# Patient Record
Sex: Female | Born: 1955
Health system: Southern US, Community
[De-identification: ages and names within clinical notes are randomized; demographics above are authoritative.]

## PROBLEM LIST (undated history)

## (undated) DIAGNOSIS — J45998 Other asthma: Secondary | ICD-10-CM

## (undated) DIAGNOSIS — Z862 Personal history of diseases of the blood and blood-forming organs and certain disorders involving the immune mechanism: Secondary | ICD-10-CM

## (undated) DIAGNOSIS — E119 Type 2 diabetes mellitus without complications: Secondary | ICD-10-CM

## (undated) DIAGNOSIS — K219 Gastro-esophageal reflux disease without esophagitis: Secondary | ICD-10-CM

## (undated) DIAGNOSIS — M5030 Other cervical disc degeneration, unspecified cervical region: Secondary | ICD-10-CM

## (undated) DIAGNOSIS — J309 Allergic rhinitis, unspecified: Secondary | ICD-10-CM

## (undated) DIAGNOSIS — Z923 Personal history of irradiation: Secondary | ICD-10-CM

## (undated) DIAGNOSIS — E669 Obesity, unspecified: Secondary | ICD-10-CM

## (undated) DIAGNOSIS — F32A Depression, unspecified: Secondary | ICD-10-CM

## (undated) DIAGNOSIS — Z9221 Personal history of antineoplastic chemotherapy: Secondary | ICD-10-CM

## (undated) DIAGNOSIS — F329 Major depressive disorder, single episode, unspecified: Secondary | ICD-10-CM

## (undated) DIAGNOSIS — M1990 Unspecified osteoarthritis, unspecified site: Secondary | ICD-10-CM

## (undated) DIAGNOSIS — J069 Acute upper respiratory infection, unspecified: Secondary | ICD-10-CM

## (undated) DIAGNOSIS — Z794 Long term (current) use of insulin: Secondary | ICD-10-CM

## (undated) DIAGNOSIS — C50412 Malignant neoplasm of upper-outer quadrant of left female breast: Principal | ICD-10-CM

## (undated) DIAGNOSIS — I10 Essential (primary) hypertension: Secondary | ICD-10-CM

## (undated) DIAGNOSIS — IMO0001 Reserved for inherently not codable concepts without codable children: Secondary | ICD-10-CM

## (undated) HISTORY — PX: OTHER SURGICAL HISTORY: SHX169

## (undated) HISTORY — PX: UPPER GI ENDOSCOPY: SHX6162

## (undated) HISTORY — DX: Gastro-esophageal reflux disease without esophagitis: K21.9

## (undated) HISTORY — DX: Obesity, unspecified: E66.9

## (undated) HISTORY — DX: Essential (primary) hypertension: I10

## (undated) HISTORY — PX: COLONOSCOPY: SHX174

## (undated) HISTORY — DX: Depression, unspecified: F32.A

## (undated) HISTORY — PX: TONSILLECTOMY: SUR1361

## (undated) HISTORY — DX: Malignant neoplasm of upper-outer quadrant of left female breast: C50.412

## (undated) HISTORY — PX: KNEE ARTHROSCOPY: SHX127

## (undated) HISTORY — DX: Unspecified osteoarthritis, unspecified site: M19.90

## (undated) HISTORY — DX: Allergic rhinitis, unspecified: J30.9

## (undated) HISTORY — PX: ABDOMINAL HYSTERECTOMY: SHX81

## (undated) HISTORY — PX: CHOLECYSTECTOMY: SHX55

## (undated) HISTORY — PX: APPENDECTOMY: SHX54

## (undated) HISTORY — DX: Acute upper respiratory infection, unspecified: J06.9

---
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
## 1978-08-09 HISTORY — PX: CHOLECYSTECTOMY: SHX55

## 1978-08-09 HISTORY — PX: TONSILLECTOMY: SUR1361

## 1978-08-09 HISTORY — PX: APPENDECTOMY: SHX54

## 1978-11-08 HISTORY — PX: OTHER SURGICAL HISTORY: SHX169

## 1993-08-09 HISTORY — PX: ABDOMINAL HYSTERECTOMY: SHX81

## 1997-12-23 ENCOUNTER — Ambulatory Visit (HOSPITAL_COMMUNITY): Admission: RE | Admit: 1997-12-23 | Discharge: 1997-12-23 | Payer: Self-pay | Admitting: Obstetrics & Gynecology

## 1998-01-07 ENCOUNTER — Emergency Department (HOSPITAL_COMMUNITY): Admission: EM | Admit: 1998-01-07 | Discharge: 1998-01-07 | Payer: Self-pay | Admitting: Internal Medicine

## 1998-04-15 ENCOUNTER — Encounter: Admission: RE | Admit: 1998-04-15 | Discharge: 1998-07-14 | Payer: Self-pay | Admitting: Emergency Medicine

## 1998-05-01 ENCOUNTER — Ambulatory Visit (HOSPITAL_COMMUNITY): Admission: RE | Admit: 1998-05-01 | Discharge: 1998-05-01 | Payer: Self-pay | Admitting: Gastroenterology

## 1999-01-19 ENCOUNTER — Ambulatory Visit (HOSPITAL_COMMUNITY): Admission: RE | Admit: 1999-01-19 | Discharge: 1999-01-19 | Payer: Self-pay | Admitting: *Deleted

## 1999-07-13 ENCOUNTER — Encounter: Payer: Self-pay | Admitting: Emergency Medicine

## 1999-07-13 ENCOUNTER — Emergency Department (HOSPITAL_COMMUNITY): Admission: EM | Admit: 1999-07-13 | Discharge: 1999-07-13 | Payer: Self-pay

## 1999-11-24 ENCOUNTER — Other Ambulatory Visit: Admission: RE | Admit: 1999-11-24 | Discharge: 1999-11-24 | Payer: Self-pay

## 2000-01-20 ENCOUNTER — Encounter: Payer: Self-pay | Admitting: Internal Medicine

## 2000-01-20 ENCOUNTER — Ambulatory Visit (HOSPITAL_COMMUNITY): Admission: RE | Admit: 2000-01-20 | Discharge: 2000-01-20 | Payer: Self-pay | Admitting: Internal Medicine

## 2000-04-12 ENCOUNTER — Encounter (INDEPENDENT_AMBULATORY_CARE_PROVIDER_SITE_OTHER): Payer: Self-pay

## 2000-04-12 ENCOUNTER — Ambulatory Visit (HOSPITAL_COMMUNITY): Admission: RE | Admit: 2000-04-12 | Discharge: 2000-04-12 | Payer: Self-pay | Admitting: *Deleted

## 2001-01-05 ENCOUNTER — Other Ambulatory Visit: Admission: RE | Admit: 2001-01-05 | Discharge: 2001-01-05 | Payer: Self-pay | Admitting: Obstetrics and Gynecology

## 2001-01-26 ENCOUNTER — Encounter: Payer: Self-pay | Admitting: Obstetrics and Gynecology

## 2001-01-26 ENCOUNTER — Ambulatory Visit (HOSPITAL_COMMUNITY): Admission: RE | Admit: 2001-01-26 | Discharge: 2001-01-26 | Payer: Self-pay | Admitting: Obstetrics and Gynecology

## 2002-01-05 ENCOUNTER — Other Ambulatory Visit: Admission: RE | Admit: 2002-01-05 | Discharge: 2002-01-05 | Payer: Self-pay | Admitting: Obstetrics and Gynecology

## 2002-03-01 ENCOUNTER — Ambulatory Visit (HOSPITAL_COMMUNITY): Admission: RE | Admit: 2002-03-01 | Discharge: 2002-03-01 | Payer: Self-pay | Admitting: Obstetrics and Gynecology

## 2002-03-01 ENCOUNTER — Encounter: Payer: Self-pay | Admitting: Obstetrics and Gynecology

## 2002-04-20 ENCOUNTER — Emergency Department (HOSPITAL_COMMUNITY): Admission: EM | Admit: 2002-04-20 | Discharge: 2002-04-20 | Payer: Self-pay | Admitting: Emergency Medicine

## 2002-12-10 ENCOUNTER — Ambulatory Visit (HOSPITAL_COMMUNITY): Admission: RE | Admit: 2002-12-10 | Discharge: 2002-12-10 | Payer: Self-pay | Admitting: *Deleted

## 2003-01-07 ENCOUNTER — Encounter: Payer: Self-pay | Admitting: *Deleted

## 2003-01-07 ENCOUNTER — Ambulatory Visit (HOSPITAL_COMMUNITY): Admission: RE | Admit: 2003-01-07 | Discharge: 2003-01-07 | Payer: Self-pay | Admitting: *Deleted

## 2003-03-07 ENCOUNTER — Encounter: Payer: Self-pay | Admitting: Obstetrics and Gynecology

## 2003-03-07 ENCOUNTER — Ambulatory Visit (HOSPITAL_COMMUNITY): Admission: RE | Admit: 2003-03-07 | Discharge: 2003-03-07 | Payer: Self-pay | Admitting: Obstetrics and Gynecology

## 2004-07-06 ENCOUNTER — Ambulatory Visit: Payer: Self-pay | Admitting: Oncology

## 2004-08-06 ENCOUNTER — Ambulatory Visit (HOSPITAL_COMMUNITY): Admission: RE | Admit: 2004-08-06 | Discharge: 2004-08-06 | Payer: Self-pay | Admitting: Orthopedic Surgery

## 2004-08-06 ENCOUNTER — Ambulatory Visit (HOSPITAL_BASED_OUTPATIENT_CLINIC_OR_DEPARTMENT_OTHER): Admission: RE | Admit: 2004-08-06 | Discharge: 2004-08-06 | Payer: Self-pay | Admitting: Orthopedic Surgery

## 2004-08-06 HISTORY — PX: CARPAL TUNNEL RELEASE: SHX101

## 2004-08-06 HISTORY — PX: TRIGGER FINGER RELEASE: SHX641

## 2004-08-24 ENCOUNTER — Encounter (INDEPENDENT_AMBULATORY_CARE_PROVIDER_SITE_OTHER): Payer: Self-pay | Admitting: Specialist

## 2004-08-24 ENCOUNTER — Ambulatory Visit (HOSPITAL_COMMUNITY): Admission: RE | Admit: 2004-08-24 | Discharge: 2004-08-24 | Payer: Self-pay | Admitting: *Deleted

## 2004-08-31 ENCOUNTER — Ambulatory Visit (HOSPITAL_COMMUNITY): Admission: RE | Admit: 2004-08-31 | Discharge: 2004-08-31 | Payer: Self-pay | Admitting: Obstetrics and Gynecology

## 2004-09-29 ENCOUNTER — Ambulatory Visit: Payer: Self-pay | Admitting: Oncology

## 2005-06-01 ENCOUNTER — Encounter: Admission: RE | Admit: 2005-06-01 | Discharge: 2005-06-01 | Payer: Self-pay | Admitting: Endocrinology

## 2005-06-15 ENCOUNTER — Ambulatory Visit (HOSPITAL_COMMUNITY): Admission: RE | Admit: 2005-06-15 | Discharge: 2005-06-15 | Payer: Self-pay | Admitting: *Deleted

## 2005-09-20 ENCOUNTER — Ambulatory Visit (HOSPITAL_COMMUNITY): Admission: RE | Admit: 2005-09-20 | Discharge: 2005-09-20 | Payer: Self-pay | Admitting: Obstetrics and Gynecology

## 2005-09-23 ENCOUNTER — Ambulatory Visit (HOSPITAL_COMMUNITY): Admission: RE | Admit: 2005-09-23 | Discharge: 2005-09-23 | Payer: Self-pay | Admitting: *Deleted

## 2006-08-16 ENCOUNTER — Encounter: Admission: RE | Admit: 2006-08-16 | Discharge: 2006-08-16 | Payer: Self-pay | Admitting: Internal Medicine

## 2006-10-17 ENCOUNTER — Ambulatory Visit (HOSPITAL_COMMUNITY): Admission: RE | Admit: 2006-10-17 | Discharge: 2006-10-17 | Payer: Self-pay | Admitting: Obstetrics and Gynecology

## 2007-01-14 ENCOUNTER — Observation Stay (HOSPITAL_COMMUNITY): Admission: EM | Admit: 2007-01-14 | Discharge: 2007-01-15 | Payer: Self-pay | Admitting: Emergency Medicine

## 2007-10-19 ENCOUNTER — Ambulatory Visit (HOSPITAL_COMMUNITY): Admission: RE | Admit: 2007-10-19 | Discharge: 2007-10-19 | Payer: Self-pay | Admitting: Obstetrics and Gynecology

## 2008-02-17 ENCOUNTER — Emergency Department (HOSPITAL_COMMUNITY): Admission: EM | Admit: 2008-02-17 | Discharge: 2008-02-17 | Payer: Self-pay | Admitting: Family Medicine

## 2008-02-20 ENCOUNTER — Encounter: Admission: RE | Admit: 2008-02-20 | Discharge: 2008-03-13 | Payer: Self-pay | Admitting: Neurosurgery

## 2008-06-04 ENCOUNTER — Encounter: Admission: RE | Admit: 2008-06-04 | Discharge: 2008-06-04 | Payer: Self-pay | Admitting: Family Medicine

## 2008-10-24 ENCOUNTER — Ambulatory Visit (HOSPITAL_COMMUNITY): Admission: RE | Admit: 2008-10-24 | Discharge: 2008-10-24 | Payer: Self-pay | Admitting: Obstetrics and Gynecology

## 2008-11-13 ENCOUNTER — Encounter: Admission: RE | Admit: 2008-11-13 | Discharge: 2008-11-13 | Payer: Self-pay | Admitting: Family Medicine

## 2008-12-14 ENCOUNTER — Encounter: Admission: RE | Admit: 2008-12-14 | Discharge: 2008-12-14 | Payer: Self-pay | Admitting: Family Medicine

## 2009-01-23 ENCOUNTER — Encounter: Admission: RE | Admit: 2009-01-23 | Discharge: 2009-02-26 | Payer: Self-pay | Admitting: Family Medicine

## 2009-03-09 ENCOUNTER — Emergency Department (HOSPITAL_COMMUNITY): Admission: EM | Admit: 2009-03-09 | Discharge: 2009-03-09 | Payer: Self-pay | Admitting: Emergency Medicine

## 2009-03-19 ENCOUNTER — Ambulatory Visit: Payer: Self-pay | Admitting: Vascular Surgery

## 2009-03-19 ENCOUNTER — Ambulatory Visit (HOSPITAL_COMMUNITY): Admission: RE | Admit: 2009-03-19 | Discharge: 2009-03-19 | Payer: Self-pay | Admitting: Sports Medicine

## 2009-03-19 ENCOUNTER — Encounter (INDEPENDENT_AMBULATORY_CARE_PROVIDER_SITE_OTHER): Payer: Self-pay | Admitting: Sports Medicine

## 2009-05-05 ENCOUNTER — Encounter: Admission: RE | Admit: 2009-05-05 | Discharge: 2009-05-05 | Payer: Self-pay | Admitting: Sports Medicine

## 2009-07-28 ENCOUNTER — Encounter: Admission: RE | Admit: 2009-07-28 | Discharge: 2009-07-28 | Payer: Self-pay | Admitting: Family Medicine

## 2009-10-28 ENCOUNTER — Ambulatory Visit (HOSPITAL_COMMUNITY): Admission: RE | Admit: 2009-10-28 | Discharge: 2009-10-28 | Payer: Self-pay | Admitting: Family Medicine

## 2010-03-03 ENCOUNTER — Emergency Department (HOSPITAL_COMMUNITY): Admission: EM | Admit: 2010-03-03 | Discharge: 2010-03-03 | Payer: Self-pay | Admitting: Emergency Medicine

## 2010-06-26 ENCOUNTER — Emergency Department (HOSPITAL_COMMUNITY)
Admission: EM | Admit: 2010-06-26 | Discharge: 2010-06-26 | Payer: Self-pay | Source: Home / Self Care | Admitting: Emergency Medicine

## 2010-08-15 ENCOUNTER — Emergency Department (HOSPITAL_COMMUNITY)
Admission: EM | Admit: 2010-08-15 | Discharge: 2010-08-15 | Payer: Self-pay | Source: Home / Self Care | Admitting: Emergency Medicine

## 2010-08-30 ENCOUNTER — Encounter: Payer: Self-pay | Admitting: Family Medicine

## 2010-12-22 NOTE — H&P (Signed)
NAMEANIKO, Dominique Dillon               ACCOUNT NO.:  [id number]   MEDICAL RECORD NO.:  [id number]          PATIENT TYPE:  INP   LOCATION:  1827                         FACILITY:  [facility]   PHYSICIAN:  Wilson Singer, M.D.DATE OF BIRTH:  [birth year]   DATE OF ADMISSION:  01/14/2007  DATE OF DISCHARGE:                              HISTORY & PHYSICAL   HISTORY:  This is a very pleasant 55 year old African American lady who  has a background history of type 2 insulin-dependent diabetes mellitus,  hypertension, hyperlipidemia and now presents with a several hour  history of dull, pressing chest pain/pressure radiating up the right  neck and going down the right arm.  She woke up at about 2 a.m. this  morning, which was approximately 7 hours ago with a blood sugar of 45.  Since this time, she had been having this chest pressure.  She had some  sweet drink to drink and her blood sugar came up to 99, but she still  continues to have the chest pressure and, therefore, comes to the  emergency room for further evaluation.  She says she apparently had a  cardiac catheterization about a year ago with, what sounds like  Human resources officer.  She has no history of  ischemic heart disease in the past, having not had a myocardial  infarction.  There is no history of cerebral vascular disease.   PAST MEDICAL HISTORY:  1. Type 2 insulin-dependent diabetes mellitus, initially diagnosed in      1995.  2. Hypertension.  3. Hyperlipidemia.  4. Gastroesophageal reflux disease.   PAST SURGICAL HISTORY:  1. Right carpal tunnel surgery in December of 2005.  2. Cesarean section.  3. Hysterectomy.  4. Cholecystectomy.  5. Tonsillectomy.  6. Bilateral knee arthroscopies.  7. She also had a colonoscopy in January of 2006 for rectal bleeding      and internal hemorrhoids were the only finding by Dr. Virginia Rochester.   SOCIAL HISTORY:  She has been divorced 35 years.  One daughter lives  with  her.  She does not smoke and does not drink alcohol.  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   FAMILY HISTORY:  Noncontributory.   REVIEW OF SYSTEMS:  Apart from the symptom mentioned above, there are no  other symptoms in all systems reviewed.   MEDICATIONS:  1. Glucophage 2 grams per day.  2. Lantus insulin 83 units daily.  3. Apidra 17 units at lunch time, 40 units at evening time.  4. TriCor 145 mg daily.  5. Zetia 10 mg daily.  6. Diovan dose unclear, but she thinks it is 80 mg daily.  7. Aciphex 20 mg daily.  8. Mobic dose unclear.  9. Effexor XR 150 mg daily.   ALLERGIES:  BIAXIN.   PHYSICAL EXAMINATION:  VITAL SIGNS:  Temperature 97.1.  Blood pressure  129/84.  Pulse 80.  Saturation 99%.  CARDIOVASCULAR EXAMINATION:  Heart sounds are present and normal without  murmurs.  There is no pericardial rub.  Jugular venous pressure is not  raised.  There is no gallop  heard.  RESPIRATORY:  Lungs fields are clear.  There is no pleural rub.  There  is no wheezing, crackles or bronchial breathing.  ABDOMEN:  Soft, nontender with no hepatosplenomegaly.  There is a  cholecystectomy scar.  NEUROLOGICAL:  She is alert and oriented with no focal neurological  signs.   INVESTIGATIONS:  Sodium 138, potassium 4.0, chloride 107, BUN 16,  creatinine 0.9.  Troponin less than 0.05.  Hemoglobin 11.2 with an MCV  reduced at 72.2, white blood cell count 7.8, platelets 431.  Electrocardiogram, done in the emergency room, shows normal sinus rhythm  with a right bundle branch block pattern, but no acute ST/T wave  changes.   IMPRESSION:  1. Chest pain, rule out cardiac ischemia.  2. Microcytic anemia.  3. Type 2 insulin-dependent diabetes mellitus.  4. Hypertension.  5. Hyperlipidemia.  6. Obesity.   PLAN:  1. Admit to telemetry and cycle cardiac enzymes.  2. Cardiology consultation.  I think this lady needs cardiac      catheterization.  3. Control diabetes and hypertension.  4.  Further recommendations will depend on patient's hospital progress.      Wilson Singer, M.D.  Electronically Signed     NCG/MEDQ  D:  01/14/2007  T:  01/14/2007  Job:  [id number]   cc:   Soyla Murphy. Renne Crigler, M.D.

## 2010-12-25 NOTE — Discharge Summary (Signed)
Dominique Dillon, Dominique Dillon               ACCOUNT NO.:  [id number]   MEDICAL RECORD NO.:  [id number]          PATIENT TYPE:  OBV   LOCATION:  5504                         FACILITY:  [facility]   PHYSICIAN:  Wilson Singer, M.D.DATE OF BIRTH:  February 27, 1956   DATE OF ADMISSION:  01/14/2007  DATE OF DISCHARGE:  01/15/2007                               DISCHARGE SUMMARY   FINAL DISCHARGE DIAGNOSES:  1. Chest pain, need for outpatient stress test.  2. Hypertension.  3. T2 insulin-dependent diabetes mellitus.  4. Hyperlipidemia.   DISCHARGE MEDICATIONS:  1. Lantus 75 units daily.  2. Lopressor 12.5 mg b.i.d.  3. Apidra 17 units at lunch and 40 units at dinner.  4. Metformin 1000 mg b.i.d.  5. Diovan 80 mg daily.  6. Tricor 145 mg daily.  7. Zetia 10 mg daily.  8. Effexor 150 mg daily.  9. Singulair 10 mg daily.   CONDITION ON DISCHARGE:  Stable.   HISTORY:  This 54 year old lady was admitted with chest pain. Please see  initial history and physical examination done by myself.   HOSPITAL PROGRESS:  The patient was admitted to telemetry and serial  cardiac enzymes were done.  These were negative. She was seen by Dr.  Allyson Sabal, cardiology who felt that this may be cardiac chest pain.  Since  there was no acute electrocardiographic changes, he felt it appropriate  to arrange a Cardiolite stress test as an outpatient.  On the day of  discharge he was chest pain.   On examination temperature 98, blood pressure 120/82, pulse 75,  saturation 97% on room air.  CT chest angiogram was negative for  pulmonary embolism.   Further disposition, she will be discharged home today and will follow  up with Dr. Allyson Sabal for the stress test.      Wilson Singer, M.D.  Electronically Signed    NCG/MEDQ  D:  03/10/2007  T:  03/11/2007  Job:  [id number]

## 2010-12-25 NOTE — Op Note (Signed)
NAMESHAVONNE, AMBROISE NO.:  [id number]   MEDICAL RECORD NO.:  [id number]          PATIENT TYPE:  AMB   LOCATION:  ENDO                         FACILITY:  [facility]   PHYSICIAN:  Georgiana Spinner, M.D.    DATE OF BIRTH:  1956-06-04   DATE OF PROCEDURE:  08/24/2004  DATE OF DISCHARGE:                                 OPERATIVE REPORT   PROCEDURE:  Colonoscopy.   INDICATIONS FOR PROCEDURE:  Rectal bleeding.   ANESTHESIA:  Demerol 25, Versed 4 mg.   DESCRIPTION OF PROCEDURE:  With the patient mildly sedated in the left  lateral decubitus position, the Olympus videoscopic colonoscope was inserted  in the rectum and passed under direct vision to the cecum, identified by the  ileocecal valve and appendiceal orifice, both of which were photographed.  From this point, the colonoscope was slowly withdrawn, taking  circumferential views of the colonic mucosa, stopping only in the rectum  which appeared normal on direct and showed hemorrhoids on retroflex view.  The endoscope was straightened and withdrawn.  The patient's vital signs and  pulse oximetry remained stable.  The patient tolerated the procedure well  without apparent complications.   FINDINGS:  Internal hemorrhoids, otherwise, an unremarkable colonoscopic  examination to the cecum.   PLAN:  See endoscopy note for further details.      GMO/MEDQ  D:  08/24/2004  T:  08/24/2004  Job:  [id number]

## 2010-12-25 NOTE — Op Note (Signed)
NAMENARELLE, SCHOENING NO.:  [id number]   MEDICAL RECORD NO.:  [id number]          PATIENT TYPE:  AMB   LOCATION:  ENDO                         FACILITY:  [facility]   PHYSICIAN:  Georgiana Spinner, M.D.    DATE OF BIRTH:  02/11/1956   DATE OF PROCEDURE:  DATE OF DISCHARGE:                                 OPERATIVE REPORT   PROCEDURE:  Upper endoscopy.   INDICATIONS:  Gastroesophageal reflux disease and rectal bleeding.   ANESTHESIA:  Demerol 50, Versed 6 mg.   PROCEDURE:  With the patient mildly sedated in the left lateral decubitus  position, the Olympus videoscopic endoscope was inserted in the mouth,  passed under direct vision through the esophagus which appeared normal.  There was no evidence of Barrett's.  We entered into the stomach, fundus  body, antrum, duodenal bulb and second portion of the duodenum were  visualized.  From this point, the endoscope was slowly withdrawn taking  several views of the duodenal mucosa.  The endoscope had been pulled back  into the stomach, placed in retroflexion and we viewed the stomach from  below.  The endoscope was then straightened and withdrawn, taken circumflex  views of the remaining gastric and esophageal mucosa stopping in the fundus  of the stomach where multiple polyps were seen, photographed and multiple  biopsies were taken.  The endoscope was then withdrawn.  The patient's vital  signs remained stable.  The endoscope was then withdrawn.  The patient's  vital signs remained stable.  The patient tolerated the procedure well with  no apparent complications.   FINDINGS:  Multiple gastric fundic polyps biopsied, await biopsy report.  The patient will call me with results and follow up with as an outpatient.  Proceed to colonoscopy.      GMO/MEDQ  D:  08/24/2004  T:  08/24/2004  Job:  [id number]

## 2010-12-25 NOTE — Procedures (Signed)
[facility]  Patient:    Dominique Dillon, Dominique Dillon                     MRN: [id number] Proc. Date: 04/12/00 Adm. Date:  [id number] Attending:  Sabino Gasser                           Procedure Report  PROCEDURE:  Colonoscopy.  INDICATION FOR PROCEDURE:  Iron deficiency anemia, GI blood loss.  ANESTHESIA:  Demerol 20 mg, Versed 3 mg was given intravenously in divided dose.  DESCRIPTION OF PROCEDURE:  With the patient mildly sedated in the left lateral decubitus position, the Olympus videoscopic colonoscope was inserted in the rectum and passed under direct vision into the cecum. The cecum was identified by the ileocecal valve and appendiceal orifice both of which were photographed. From this point, the colonoscope was slowly withdrawn taking circumferential views of the entire colonic mucosa, stopping only in the rectum which appeared normal in direct view and showed small hemorrhoids on retroflexed view. The endoscope was straightened and withdrawn through the anal canal which also appeared normal. The patients vital signs and pulse oximeter remained stable. The patient tolerated the procedure well without apparent complications.  FINDINGS:  Internal hemorrhoids, small otherwise unremarkable colonoscopic examination.  PLAN:  Await biopsy report of endoscopy. Will have the patient follow-up with me as planned. DD:  04/12/00 TD:  04/12/00 Job: [id number]

## 2010-12-25 NOTE — Op Note (Signed)
Dominique Dillon, Dominique Dillon NO.:  [id number]   MEDICAL RECORD NO.:  [id number]          PATIENT TYPE:  AMB   LOCATION:  DSC                          FACILITY:  [facility]   PHYSICIAN:  Cindee Salt, M.D.       DATE OF BIRTH:  06/09/1956   DATE OF PROCEDURE:  08/06/2004  DATE OF DISCHARGE:                                 OPERATIVE REPORT   PREOPERATIVE DIAGNOSIS:  Carpal tunnel syndrome, right wrist. Stenosing  tenosynovitis, right middle finger.   POSTOPERATIVE DIAGNOSIS:  Carpal tunnel syndrome, right wrist. Stenosing  tenosynovitis, right middle finger.   OPERATION:  Release A1 pulley, right middle finger. Release right carpal  tunnel.   SURGEON:  Cindee Salt, M.D.   ASSISTANT:  ___________.   ANESTHESIA:  IV regional.   HISTORY:  The patient is a 55 year old female with a history of carpal  tunnel syndrome and triggering of her right middle, right hand. It has not  responded to conservative treatment.   PROCEDURE:  The patient was brought to the operating room where a forearm-  based IV regional anesthetic was carried out without difficulty. She prepped  using Duraprep, supine position, with the right arm free. A longitudinal  incision was made in the palm and carried through subcutaneous tissue.  Bleeders were electrocauterized. Palmar fascia was split. Superficial palmar  arch identified. The flexor tendon to the ring and little finger identified  to the ulnar side of the median nerve. The carpal retinaculum was incised  with sharp dissection at a right angle and retractor placed between skin and  forearm fascia. The fascia was released for approximately a centimeter and a  half proximal to the wrist crease under direct vision. Canal was explored.  An area of the compression of the nerve apparent. A persistent median artery  was present. This was not thrombosed. Tenosynovial tissue was moderately  thickened. No further lesions were identified. The wound was  irrigated. The  skin was closed with interrupted 5-0 nylon sutures. A separate incision was  then made over the A1 pulley of the right middle finger, carried down  through subcutaneous tissue. Neurovascular structures identified and  protected. Retractors placed. An incision was then made on the radial aspect  of the A1 pulley; this pulley released. A separate incision was made in the  central aspect of the A2 pulley. The finger placed through a range of  motion. No further triggering was evident. The wound was irrigated. The skin  closed with interrupted 5-0 nylon sutures. Sterile compressive dressing and  splint was applied. The patient tolerated the procedure well and was taken  to the recovery room for observation in satisfactory condition. She is  discharged home to return to [facility] in one week on  Vicodin.     GK/MEDQ  D:  08/06/2004  T:  08/06/2004  Job:  [id number]

## 2010-12-25 NOTE — Op Note (Signed)
   NAME:  Dominique Dillon, Dominique Dillon                        ACCOUNT NO.:  [id number]   MEDICAL RECORD NO.:  [id number]                   PATIENT TYPE:  AMB   LOCATION:  ENDO                                 FACILITY:  [facility]   PHYSICIAN:  Georgiana Spinner, M.D.                 DATE OF BIRTH:  01-11-1956   DATE OF PROCEDURE:  12/10/2002  DATE OF DISCHARGE:                                 OPERATIVE REPORT   PROCEDURE:  Upper endoscopy.   INDICATIONS:  GERD.   ANESTHESIA:  Demerol 80 mg, Versed 8 mg.   DESCRIPTION OF PROCEDURE:  With the patient mildly sedated in the left  lateral decubitus position, the Olympus videoscopic endoscope was inserted  in the mouth, passed under direct vision through the esophagus, which  appeared normal.  There was no evidence of Barrett's.  We entered into the  stomach.  Fundus, body, antrum, duodenal bulb, second portion of duodenum  all appeared normal.  From this point the endoscope was slowly withdrawn,  taking circumferential views of the duodenal mucosa until the endoscope had  been pulled back into the stomach, placed in retroflexion to view the  stomach from below, and a hernia was seen and the esophagus could be seen  from below, indicating a lax wrap of the GE junction around the endoscope.  Again on retroflexed view there was no evidence of Barrett's esophagus seen.  The endoscope was then straightened and withdrawn, taking circumferential  views of the remaining gastric and esophageal mucosa.  The patient's vital  signs and pulse oximetry remained stable.  The patient tolerated the  procedure well without apparent complications.   FINDINGS:  Changes of hiatal hernia with lax esophageal sphincter.   PLAN:  Have the patient follow up with me as an outpatient and discuss  further plans for management of her reflux symptomatology.                                               Georgiana Spinner, M.D.    GMO/MEDQ  D:  12/10/2002  T:  12/10/2002  Job:   [id number]

## 2010-12-25 NOTE — Op Note (Signed)
[facility]  Patient:    Dominique Dillon, Dominique Dillon                     MRN: [id number] Proc. Date: 04/12/00 Adm. Date:  [id number] Attending:  Sabino Gasser                           Operative Report  PROCEDURE:  Upper endoscopy with biopsy.  INDICATION FOR PROCEDURE:  GI blood loss.  ANESTHESIA:  Demerol 60 mg, Versed 7 mg.  DESCRIPTION OF PROCEDURE:  With the patient mildly sedated in the left lateral decubitus position, the Olympus videoscopic endoscope was inserted in the mouth and passed under direct vision through the esophagus. The distal esophagus was approached and appeared relatively normal. There was a question of a very small segment of Barretts esophagus which may have been normal but we photographed it and biopsied this area. We entered into the stomach. Most of the fundus, body, antrum, duodenal bulb, and second portion of the duodenum were all visualized and appeared normal.  From this point, the endoscope was slowly withdrawn taking circumferential views of the entire duodenal mucosa until the endoscope was then pulled back into the stomach, placed in retroflexion to view the stomach from below and this too appeared normal. The endoscope was straightened and pulled back from distal to proximal stomach taking circumferential views of the entire gastric mucosa stopping in the fundus where on the anterior wall of the stomach, a number of small polyps were seen, photographed and biopsied. We then withdrew taking circumferential views of the remaining gastric and esophageal mucosa which otherwise appeared normal. The patients vital signs and pulse oximeter remained stable. The patient tolerated the procedure well and there were no apparent complications.  FINDINGS:  Polyps of fundus and a questionable Barretts esophagus all biopsied.  PLAN:  Await biopsy report. The patient will call me for results and follow-up with me as an outpatient. Proceed to  colonoscopy. DD:  04/12/00 TD:  04/12/00 Job: [id number]

## 2011-02-03 ENCOUNTER — Other Ambulatory Visit (HOSPITAL_COMMUNITY): Payer: Self-pay | Admitting: Obstetrics and Gynecology

## 2011-02-03 DIAGNOSIS — Z1231 Encounter for screening mammogram for malignant neoplasm of breast: Secondary | ICD-10-CM

## 2011-02-12 ENCOUNTER — Ambulatory Visit (HOSPITAL_COMMUNITY): Payer: BC Managed Care – PPO

## 2011-02-12 ENCOUNTER — Ambulatory Visit (HOSPITAL_COMMUNITY)
Admission: RE | Admit: 2011-02-12 | Discharge: 2011-02-12 | Disposition: A | Discharge status: Home / Self Care | Payer: BC Managed Care – PPO | Source: Ambulatory Visit | Attending: Obstetrics and Gynecology | Admitting: Obstetrics and Gynecology

## 2011-02-12 DIAGNOSIS — Z1231 Encounter for screening mammogram for malignant neoplasm of breast: Secondary | ICD-10-CM | POA: Insufficient documentation

## 2011-05-27 LAB — D-DIMER, QUANTITATIVE: D-Dimer, Quant: 0.62 — ABNORMAL HIGH

## 2011-05-27 LAB — CBC
HCT: 34.2 — ABNORMAL LOW
Hemoglobin: 11.1 — ABNORMAL LOW
Hemoglobin: 11.2 — ABNORMAL LOW
MCHC: 32.3
MCHC: 32.4
MCV: 72.1 — ABNORMAL LOW
Platelets: 416 — ABNORMAL HIGH
Platelets: 431 — ABNORMAL HIGH
RBC: 4.75
RBC: 4.79
RDW: 16.2 — ABNORMAL HIGH
RDW: 16.4 — ABNORMAL HIGH
WBC: 7.7
WBC: 7.8

## 2011-05-27 LAB — TSH: TSH: 0.864

## 2011-05-27 LAB — DIFFERENTIAL
Basophils Absolute: 0
Basophils Relative: 1
Eosinophils Absolute: 0.2
Eosinophils Relative: 3
Lymphocytes Relative: 30
Lymphs Abs: 2.3
Monocytes Absolute: 0.4
Monocytes Relative: 6
Neutro Abs: 4.8
Neutrophils Relative %: 62

## 2011-05-27 LAB — URINALYSIS, ROUTINE W REFLEX MICROSCOPIC
Bilirubin Urine: NEGATIVE
Glucose, UA: NEGATIVE
Hgb urine dipstick: NEGATIVE
Ketones, ur: NEGATIVE
Nitrite: NEGATIVE
Protein, ur: NEGATIVE
Specific Gravity, Urine: 1.017
Urobilinogen, UA: 0.2
pH: 6

## 2011-05-27 LAB — I-STAT 8, (EC8 V) (CONVERTED LAB)
Acid-Base Excess: 3 — ABNORMAL HIGH
BUN: 16
Bicarbonate: 25.8 — ABNORMAL HIGH
Chloride: 107
Glucose, Bld: 106 — ABNORMAL HIGH
HCT: 37
Hemoglobin: 12.6
Operator id: 196461
Potassium: 4
Sodium: 138
TCO2: 27
pCO2, Ven: 32.3 — ABNORMAL LOW
pH, Ven: 7.51 — ABNORMAL HIGH

## 2011-05-27 LAB — PROTIME-INR
INR: 1
Prothrombin Time: 13.6

## 2011-05-27 LAB — CARDIAC PANEL(CRET KIN+CKTOT+MB+TROPI)
CK, MB: 1.6
Relative Index: 1.7
Total CK: 109
Troponin I: 0.02
Troponin I: 0.03

## 2011-05-27 LAB — POCT I-STAT CREATININE
Creatinine, Ser: 0.9
Operator id: 196461

## 2011-05-27 LAB — CK TOTAL AND CKMB (NOT AT ARMC)
CK, MB: 2
Relative Index: 1.7
Total CK: 117

## 2011-05-27 LAB — POCT CARDIAC MARKERS
CKMB, poc: 1
Myoglobin, poc: 61.4
Operator id: 196461
Troponin i, poc: <0.05

## 2011-05-27 LAB — URINE MICROSCOPIC-ADD ON

## 2011-05-27 LAB — IRON AND TIBC
Saturation Ratios: 12 — ABNORMAL LOW
UIBC: 257

## 2011-05-27 LAB — FERRITIN: Ferritin: 51 (ref 10–291)

## 2011-05-27 LAB — HEMOGLOBIN A1C: Hgb A1c MFr Bld: 7.9 — ABNORMAL HIGH

## 2011-09-02 ENCOUNTER — Other Ambulatory Visit: Payer: Self-pay | Admitting: Internal Medicine

## 2011-09-02 DIAGNOSIS — R0989 Other specified symptoms and signs involving the circulatory and respiratory systems: Secondary | ICD-10-CM

## 2011-09-08 ENCOUNTER — Other Ambulatory Visit: Payer: Self-pay | Admitting: Internal Medicine

## 2011-09-08 ENCOUNTER — Ambulatory Visit
Admission: RE | Admit: 2011-09-08 | Discharge: 2011-09-08 | Disposition: A | Discharge status: Home / Self Care | Payer: BC Managed Care – PPO | Source: Ambulatory Visit | Attending: Internal Medicine | Admitting: Internal Medicine

## 2011-09-08 DIAGNOSIS — R0989 Other specified symptoms and signs involving the circulatory and respiratory systems: Secondary | ICD-10-CM

## 2011-09-08 DIAGNOSIS — E049 Nontoxic goiter, unspecified: Secondary | ICD-10-CM

## 2012-04-13 ENCOUNTER — Other Ambulatory Visit: Payer: Self-pay | Admitting: Obstetrics and Gynecology

## 2012-04-13 DIAGNOSIS — Z1231 Encounter for screening mammogram for malignant neoplasm of breast: Secondary | ICD-10-CM

## 2012-04-19 ENCOUNTER — Ambulatory Visit (HOSPITAL_COMMUNITY): Payer: BC Managed Care – PPO

## 2012-04-21 ENCOUNTER — Ambulatory Visit (HOSPITAL_COMMUNITY)
Admission: RE | Admit: 2012-04-21 | Discharge: 2012-04-21 | Disposition: A | Discharge status: Home / Self Care | Payer: BC Managed Care – PPO | Source: Ambulatory Visit | Attending: Obstetrics and Gynecology | Admitting: Obstetrics and Gynecology

## 2012-04-21 DIAGNOSIS — Z1231 Encounter for screening mammogram for malignant neoplasm of breast: Secondary | ICD-10-CM | POA: Insufficient documentation

## 2012-06-14 ENCOUNTER — Ambulatory Visit: Payer: BC Managed Care – PPO | Admitting: *Deleted

## 2012-06-20 ENCOUNTER — Encounter: Payer: BC Managed Care – PPO | Attending: Pharmacist | Admitting: *Deleted

## 2012-06-20 ENCOUNTER — Encounter: Payer: Self-pay | Admitting: *Deleted

## 2012-06-20 DIAGNOSIS — E119 Type 2 diabetes mellitus without complications: Secondary | ICD-10-CM | POA: Insufficient documentation

## 2012-06-20 DIAGNOSIS — Z713 Dietary counseling and surveillance: Secondary | ICD-10-CM | POA: Insufficient documentation

## 2012-06-20 DIAGNOSIS — E785 Hyperlipidemia, unspecified: Secondary | ICD-10-CM | POA: Insufficient documentation

## 2012-06-20 NOTE — Progress Notes (Signed)
  Medical Nutrition Therapy:  Appt start time: 1645 end time:  1745.  Assessment:  Primary concerns today: patient here for carb counting refresher for diabetes, obesity and hypercholesterolemia. 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 She is frustrated with not being able to lose weight. She states she was diagnosed with diabetes in 1995 and has had some diabetes education in the meantime. She states she does SMBG with an average range of 155-175 in the AM.  MEDICATIONS: see list. Diabetes medications include Lantus, Novolog at meals and Metformin   DIETARY INTAKE:  Usual eating pattern includes 1-2 meals and 0-1 snacks per day.  Everyday foods include variety of all food groups including high fat and high sugar foods.  Avoided foods include none stated.    24-hr recall:  B ( AM): coffee with cream  Snk ( AM): yogurt occasionally  L ( PM): fries and regular soda OR skips Snk ( PM): not usually D ( PM): greens, baked sweet potato, broccoli, corn bread and green tea OR lean meat, starch vegetable meal Snk ( PM): none Beverages: diet green tea, regular soda, coffee with cream  Usual physical activity: walks in neighborhood and at work around parking lot 5 times  Estimated energy needs: 1400 calories 158 g carbohydrates 105 g protein 39 g fat  Progress Towards Goal(s):  In progress.   Nutritional Diagnosis:  NB-1.1 Food and nutrition-related knowledge deficit As related to diabetes management.  As evidenced by A1c of 7.9%.    Intervention:  Nutrition counseling and diabetes education initiated. Discussed basic physiology of diabetes, SMBG and rationale of checking BG at alternate times of day, A1c, Carb Counting and reading food labels, and benefits of increased activity. Also discussed action of her diabetes medications and potentially considering moving Lantus from AM to evening dose to assist with Dawn Phenomenon and  elevated BG's in AM. Also recommend closer to a 50% distribution of Lantus and Novolog.  Plan: Aim for 3-4 Carb Choices (45 - 60 grams) per meal Ask pharmacist about moving Lantus to evening time instead of AM to help bring AM BG down Ask about decreasing Lantus and increasing Novolog to a 50% distribution  Read food labels for total carbohydrate of foods  Handouts given during visit include: Living Well with Diabetes Carb Counting and Food Label handouts Meal Plan Card  Monitoring/Evaluation:  Dietary intake, exercise, insulin administration clarification, and body weight in 4 week(s).

## 2012-06-20 NOTE — Patient Instructions (Signed)
Plan: Aim for 3-4 Carb Choices (45 - 60 grams) per meal Ask pharmacist about moving Lantus to evening time instead of AM to help bring AM BG down Ask about decreasing Lantus and increasing Novolog to a 50% distribution  Read food labels for total carbohydrate of foods

## 2012-06-26 ENCOUNTER — Encounter: Payer: Self-pay | Admitting: *Deleted

## 2012-07-27 ENCOUNTER — Ambulatory Visit: Payer: BC Managed Care – PPO | Admitting: *Deleted

## 2013-05-14 ENCOUNTER — Other Ambulatory Visit (HOSPITAL_COMMUNITY): Payer: Self-pay | Admitting: Obstetrics and Gynecology

## 2013-05-14 DIAGNOSIS — Z1231 Encounter for screening mammogram for malignant neoplasm of breast: Secondary | ICD-10-CM

## 2013-05-15 ENCOUNTER — Ambulatory Visit (HOSPITAL_COMMUNITY): Payer: BC Managed Care – PPO

## 2013-05-15 ENCOUNTER — Ambulatory Visit (HOSPITAL_COMMUNITY)
Admission: RE | Admit: 2013-05-15 | Discharge: 2013-05-15 | Disposition: A | Discharge status: Home / Self Care | Payer: BC Managed Care – PPO | Source: Ambulatory Visit | Attending: Obstetrics and Gynecology | Admitting: Obstetrics and Gynecology

## 2013-05-15 DIAGNOSIS — Z1231 Encounter for screening mammogram for malignant neoplasm of breast: Secondary | ICD-10-CM | POA: Insufficient documentation

## 2014-03-07 ENCOUNTER — Encounter (HOSPITAL_COMMUNITY): Payer: Self-pay | Admitting: Emergency Medicine

## 2014-03-07 ENCOUNTER — Inpatient Hospital Stay (HOSPITAL_COMMUNITY)
Admission: EM | Admit: 2014-03-07 | Discharge: 2014-03-11 | DRG: 572 | Disposition: A | Discharge status: Home / Self Care | Payer: BC Managed Care – PPO | Attending: Internal Medicine | Admitting: Internal Medicine

## 2014-03-07 DIAGNOSIS — E785 Hyperlipidemia, unspecified: Secondary | ICD-10-CM | POA: Diagnosis present

## 2014-03-07 DIAGNOSIS — E089 Diabetes mellitus due to underlying condition without complications: Secondary | ICD-10-CM

## 2014-03-07 DIAGNOSIS — Z79899 Other long term (current) drug therapy: Secondary | ICD-10-CM

## 2014-03-07 DIAGNOSIS — E119 Type 2 diabetes mellitus without complications: Secondary | ICD-10-CM

## 2014-03-07 DIAGNOSIS — R739 Hyperglycemia, unspecified: Secondary | ICD-10-CM

## 2014-03-07 DIAGNOSIS — I10 Essential (primary) hypertension: Secondary | ICD-10-CM

## 2014-03-07 DIAGNOSIS — D630 Anemia in neoplastic disease: Secondary | ICD-10-CM | POA: Clinically undetermined

## 2014-03-07 DIAGNOSIS — Z6838 Body mass index (BMI) 38.0-38.9, adult: Secondary | ICD-10-CM

## 2014-03-07 DIAGNOSIS — E118 Type 2 diabetes mellitus with unspecified complications: Secondary | ICD-10-CM

## 2014-03-07 DIAGNOSIS — Z7982 Long term (current) use of aspirin: Secondary | ICD-10-CM

## 2014-03-07 DIAGNOSIS — Z794 Long term (current) use of insulin: Secondary | ICD-10-CM

## 2014-03-07 DIAGNOSIS — L02211 Cutaneous abscess of abdominal wall: Secondary | ICD-10-CM | POA: Diagnosis present

## 2014-03-07 DIAGNOSIS — L03311 Cellulitis of abdominal wall: Secondary | ICD-10-CM

## 2014-03-07 DIAGNOSIS — L0390 Cellulitis, unspecified: Secondary | ICD-10-CM | POA: Diagnosis present

## 2014-03-07 DIAGNOSIS — L02219 Cutaneous abscess of trunk, unspecified: Principal | ICD-10-CM | POA: Diagnosis present

## 2014-03-07 DIAGNOSIS — K219 Gastro-esophageal reflux disease without esophagitis: Secondary | ICD-10-CM

## 2014-03-07 DIAGNOSIS — L03319 Cellulitis of trunk, unspecified: Principal | ICD-10-CM

## 2014-03-07 DIAGNOSIS — D649 Anemia, unspecified: Secondary | ICD-10-CM | POA: Diagnosis present

## 2014-03-07 LAB — CBG MONITORING, ED: GLUCOSE-CAPILLARY: 168 mg/dL — AB (ref 70–99)

## 2014-03-07 MED ORDER — SODIUM CHLORIDE 0.9 % IV BOLUS (SEPSIS)
500.0000 mL | Freq: Once | INTRAVENOUS | Status: AC
  Administered 2014-03-08: 500 mL via INTRAVENOUS

## 2014-03-07 MED ORDER — SODIUM CHLORIDE 0.9 % IV SOLN: Freq: Once | INTRAVENOUS | Status: DC

## 2014-03-07 NOTE — ED Notes (Signed)
Pt states she gave herself an insulin shot on Sunday and Monday she started having a red spot on her abdomen  Pt was seen by her PCP on Tuesday and was started on doxycycline  Pt states today it burst around 6pm and drained a large amt of fluid  Pt has a bubble filled with fluid noted on her abdomen and redness noted  Area is hard and hot to touch

## 2014-03-07 NOTE — ED Provider Notes (Signed)
  Face-to-face evaluation   History: She complains of gradually worse, swelling and discomfort of her abdomen, for several days. Her PCP started her on doxycycline, 4 days ago. This has not helped. She complains of pain in the abdomen, but no generalized pain, weakness, or dizziness  Physical exam: Obese, alert, calm, cooperative. Mass of right lower quadrant, abdominal wall, about 6 x 6 cm. This is firm and fluctuant.  Medical screening examination/treatment/procedure(s) were conducted as a shared visit with non-physician practitioner(s) and myself.  I personally evaluated the patient during the encounter  Dominique Blade, MD 03/08/14 1200

## 2014-03-07 NOTE — ED Notes (Signed)
Pt states that her blood sugar was dropping, given two sips of cola

## 2014-03-07 NOTE — ED Notes (Signed)
4 attempts to start iv. unsucessful.

## 2014-03-07 NOTE — ED Provider Notes (Signed)
CSN: [id number]     Arrival date & time 03/07/14  1921 History   First MD Initiated Contact with Patient 03/07/14 2214     Chief Complaint  Patient presents with  . Abscess     (Consider location/radiation/quality/duration/timing/severity/associated sxs/prior Treatment) Patient is a 58 y.o. female presenting with abscess.  Abscess Associated symptoms: no fatigue, no fever, no nausea and no vomiting    Ms. Amador is a 58 year old female with past medical history of diabetes who presents to the ER tonight with an abscess. Patient states she gave herself an insulin shot on Sunday, 03/03/14 and began to have a painful area in the same region on Monday. She states she wants to her PCP on Tuesday where she received a prescription for doxycycline. She states the abscess continued to grow and became more painful over the past 2 days and she came in tonight for further evaluation. Patient denies fever, chills, nausea, vomiting.  Past Medical History  Diagnosis Date  . GERD (gastroesophageal reflux disease)   . Hypertension   . Hyperlipidemia   . Obesity   . Diabetes mellitus without complication    Past Surgical History  Procedure Laterality Date  . C section x 2    . Explorartory lap    . Bilatreral knee surgery    . Abdominal hysterectomy    . Cholecystectomy    . Abdominal surgery    . Appendectomy    . Tonsillectomy     Family History  Problem Relation Age of Onset  . Asthma Other   . Cancer Other   . Hyperlipidemia Other   . Heart disease Other   . Hypertension Other   . Stroke Other    History  Substance Use Topics  . Smoking status: Never Smoker   . Smokeless tobacco: Never Used  . Alcohol Use: No   OB History   Grav Para Term Preterm Abortions TAB SAB Ect Mult Living                 Review of Systems  Constitutional: Negative for fever, chills and fatigue.  Respiratory: Negative for chest tightness and shortness of breath.   Cardiovascular: Negative for chest  pain and leg swelling.  Gastrointestinal: Positive for abdominal pain. Negative for nausea, vomiting and diarrhea.  Genitourinary: Negative for dysuria.  Skin: Negative for rash.  Neurological: Negative for dizziness, syncope, weakness and numbness.  Psychiatric/Behavioral: Negative.       Allergies  Biaxin  Home Medications   Prior to Admission medications   Medication Sig Start Date End Date Taking? Authorizing Provider  aspirin 81 MG tablet Take 81 mg by mouth daily.   Yes Historical Provider, MD  calcium elemental as carbonate (CALCIUM ANTACID ULTRA) 400 MG tablet Chew 1,000 mg by mouth 3 (three) times daily.   Yes Historical Provider, MD  cetirizine (ZYRTEC) 10 MG chewable tablet Chew 10 mg by mouth daily.   Yes Historical Provider, MD  Cholecalciferol (VITAMIN D) 1000 UNITS capsule Take 1,000 Units by mouth daily.   Yes Historical Provider, MD  doxycycline (VIBRAMYCIN) 100 MG capsule Take 100 mg by mouth 2 (two) times daily.  03/05/14  Yes Historical Provider, MD  DULoxetine (CYMBALTA) 60 MG capsule Take 60 mg by mouth daily.   Yes Historical Provider, MD  ferrous sulfate 325 (65 FE) MG tablet Take 325 mg by mouth daily with breakfast.   Yes Historical Provider, MD  hydrochlorothiazide (HYDRODIURIL) 25 MG tablet Take 25 mg by mouth every other  day.    Yes Historical Provider, MD  losartan (COZAAR) 50 MG tablet Take 50 mg by mouth daily.   Yes Historical Provider, MD  metFORMIN (GLUCOPHAGE) 1000 MG tablet Take 1,000 mg by mouth 2 (two) times daily with a meal.   Yes Historical Provider, MD  NOVOLOG MIX 70/30 FLEXPEN (70-30) 100 UNIT/ML Pen Inject 50 Units into the skin 2 (two) times daily.  01/30/14  Yes Historical Provider, MD  omega-3 acid ethyl esters (LOVAZA) 1 G capsule Take 1 g by mouth daily.   Yes Historical Provider, MD  pantoprazole (PROTONIX) 40 MG tablet Take 40 mg by mouth daily.   Yes Historical Provider, MD  Pitavastatin Calcium (LIVALO) 4 MG TABS Take 4 mg by mouth  every other day.   Yes Historical Provider, MD   BP 147/83  Pulse 104  Temp(Src) 98.1 F (36.7 C) (Oral)  Resp 20  SpO2 100% Physical Exam  Constitutional: She is oriented to person, place, and time. She appears well-developed and well-nourished. No distress.  HENT:  Head: Normocephalic and atraumatic.  Eyes: Pupils are equal, round, and reactive to light. No scleral icterus.  Neck: Normal range of motion.  Cardiovascular: Normal rate, regular rhythm, S1 normal, S2 normal and normal pulses.   No murmur heard. Pulmonary/Chest: Effort normal and breath sounds normal. No accessory muscle usage. No respiratory distress.  Abdominal: Soft. Normal appearance and bowel sounds are normal. There is no tenderness.  Neurological: She is alert and oriented to person, place, and time. She has normal strength. No cranial nerve deficit.  Skin: Skin is warm and dry. No rash noted. She is not diaphoretic. No pallor.  Approximately 6x6cm, fluctuant abscess noted superficially in patients right lower quadrant of her abdomen.  Psychiatric: She has a normal mood and affect.    ED Course  INCISION AND DRAINAGE Date/Time: 03/08/2014 12:10 AM Performed by: Carrie Mew Authorized by: Carrie Mew Consent: Verbal consent obtained. Risks and benefits: risks, benefits and alternatives were discussed Consent given by: patient Patient identity confirmed: verbally with patient Time out: Immediately prior to procedure a "time out" was called to verify the correct patient, procedure, equipment, support staff and site/side marked as required. Type: abscess Body area: trunk Location details: abdomen Anesthesia: local infiltration Local anesthetic: lidocaine 1% with epinephrine Anesthetic total: 12 ml Patient sedated: no Scalpel size: 11 Incision type: single straight Complexity: simple Drainage: purulent Drainage amount: copious Wound treatment: wound left open Packing material: 1/4 in iodoform  gauze Patient tolerance: Patient tolerated the procedure well with no immediate complications. Comments: Patient experienced a mild vasovagal response at the very end of the procedure. Patient became clammy, dizzy, nauseated. Patient sat up in bed and drink soda and began to feel better within approximately 5 minutes.   (including critical care time) Labs Review Labs Reviewed  CBC WITH DIFFERENTIAL  I-STAT CHEM 8, ED    Imaging Review No results found.   EKG Interpretation None      MDM   Final diagnoses:  None    Patient with five-day history of abscess which has worsened since Sunday. Patient saw her PCP on Tuesday, given doxycycline by mouth which she has been compliant with, however has had no relief and noticed that her abscesses has gotten bigger. Due to patient's history of diabetes, labs were assessed to identify or rule out systemic infection.  0007: Patient underwent incision and drainage as noted in the procedure note above. Patient began on IV clindamycin for her  abscess  0050: Patient states she's feeling "much better" after her vasovagal episode  0120: Decision made to admit pt based on DM, size of abscess and failed course of antibiotics. Patient shows no signs of sepsis with normal vitals no leukocytosis, afebrile.  Hospital is contacted and patient admitted.     Signed,  Dahlia Bailiff, PA-C 2:00 AM     Carrie Mew, PA-C 03/08/14 0200

## 2014-03-08 ENCOUNTER — Encounter (HOSPITAL_COMMUNITY): Payer: Self-pay | Admitting: General Surgery

## 2014-03-08 ENCOUNTER — Inpatient Hospital Stay (HOSPITAL_COMMUNITY): Payer: BC Managed Care – PPO | Admitting: Anesthesiology

## 2014-03-08 ENCOUNTER — Encounter (HOSPITAL_COMMUNITY): Payer: BC Managed Care – PPO | Admitting: Anesthesiology

## 2014-03-08 ENCOUNTER — Encounter (HOSPITAL_COMMUNITY)
Admission: EM | Disposition: A | Discharge status: Home / Self Care | Payer: Self-pay | Source: Home / Self Care | Attending: Internal Medicine

## 2014-03-08 DIAGNOSIS — D630 Anemia in neoplastic disease: Secondary | ICD-10-CM | POA: Clinically undetermined

## 2014-03-08 DIAGNOSIS — E119 Type 2 diabetes mellitus without complications: Secondary | ICD-10-CM | POA: Diagnosis present

## 2014-03-08 DIAGNOSIS — L03319 Cellulitis of trunk, unspecified: Principal | ICD-10-CM

## 2014-03-08 DIAGNOSIS — L0390 Cellulitis, unspecified: Secondary | ICD-10-CM | POA: Diagnosis present

## 2014-03-08 DIAGNOSIS — K219 Gastro-esophageal reflux disease without esophagitis: Secondary | ICD-10-CM | POA: Diagnosis present

## 2014-03-08 DIAGNOSIS — L02219 Cutaneous abscess of trunk, unspecified: Principal | ICD-10-CM

## 2014-03-08 DIAGNOSIS — I10 Essential (primary) hypertension: Secondary | ICD-10-CM | POA: Diagnosis present

## 2014-03-08 DIAGNOSIS — E118 Type 2 diabetes mellitus with unspecified complications: Secondary | ICD-10-CM

## 2014-03-08 DIAGNOSIS — L02211 Cutaneous abscess of abdominal wall: Secondary | ICD-10-CM | POA: Diagnosis present

## 2014-03-08 HISTORY — PX: IRRIGATION AND DEBRIDEMENT ABSCESS: SHX5252

## 2014-03-08 LAB — CBC WITH DIFFERENTIAL/PLATELET
BASOS ABS: 0 10*3/uL (ref 0.0–0.1)
Basophils Relative: 0 % (ref 0–1)
EOS PCT: 2 % (ref 0–5)
Eosinophils Absolute: 0.2 10*3/uL (ref 0.0–0.7)
HCT: 33.5 % — ABNORMAL LOW (ref 36.0–46.0)
Hemoglobin: 11.1 g/dL — ABNORMAL LOW (ref 12.0–15.0)
LYMPHS ABS: 1.9 10*3/uL (ref 0.7–4.0)
Lymphocytes Relative: 19 % (ref 12–46)
MCH: 23.3 pg — ABNORMAL LOW (ref 26.0–34.0)
MCHC: 33.1 g/dL (ref 30.0–36.0)
MCV: 70.4 fL — AB (ref 78.0–100.0)
MONO ABS: 0.7 10*3/uL (ref 0.1–1.0)
MONOS PCT: 7 % (ref 3–12)
NEUTROS PCT: 72 % (ref 43–77)
Neutro Abs: 7 10*3/uL (ref 1.7–7.7)
PLATELETS: 307 10*3/uL (ref 150–400)
RBC: 4.76 MIL/uL (ref 3.87–5.11)
RDW: 13.9 % (ref 11.5–15.5)
WBC: 9.8 10*3/uL (ref 4.0–10.5)

## 2014-03-08 LAB — CBC
HCT: 31.4 % — ABNORMAL LOW (ref 36.0–46.0)
HEMOGLOBIN: 10.3 g/dL — AB (ref 12.0–15.0)
MCH: 23.2 pg — AB (ref 26.0–34.0)
MCHC: 32.5 g/dL (ref 30.0–36.0)
MCV: 71.5 fL — ABNORMAL LOW (ref 78.0–100.0)
Platelets: 297 10*3/uL (ref 150–400)
RBC: 4.39 MIL/uL (ref 3.87–5.11)
RDW: 13.9 % (ref 11.5–15.5)
WBC: 10 10*3/uL (ref 4.0–10.5)

## 2014-03-08 LAB — COMPREHENSIVE METABOLIC PANEL
ALK PHOS: 90 U/L (ref 39–117)
ALT: 8 U/L (ref 0–35)
AST: 12 U/L (ref 0–37)
Albumin: 3.1 g/dL — ABNORMAL LOW (ref 3.5–5.2)
Anion gap: 15 (ref 5–15)
BUN: 13 mg/dL (ref 6–23)
CALCIUM: 9.6 mg/dL (ref 8.4–10.5)
CO2: 23 meq/L (ref 19–32)
Chloride: 99 mEq/L (ref 96–112)
Creatinine, Ser: 0.69 mg/dL (ref 0.50–1.10)
GFR calc Af Amer: >90 mL/min (ref >90)
GFR calc non Af Amer: >90 mL/min (ref >90)
Glucose, Bld: 194 mg/dL — ABNORMAL HIGH (ref 70–99)
POTASSIUM: 3.9 meq/L (ref 3.7–5.3)
SODIUM: 137 meq/L (ref 137–147)
Total Bilirubin: 0.6 mg/dL (ref 0.3–1.2)
Total Protein: 7.1 g/dL (ref 6.0–8.3)

## 2014-03-08 LAB — I-STAT CHEM 8, ED
BUN: 14 mg/dL (ref 6–23)
CHLORIDE: 99 meq/L (ref 96–112)
Calcium, Ion: 1.25 mmol/L — ABNORMAL HIGH (ref 1.12–1.23)
Creatinine, Ser: 0.8 mg/dL (ref 0.50–1.10)
Glucose, Bld: 202 mg/dL — ABNORMAL HIGH (ref 70–99)
HCT: 37 % (ref 36.0–46.0)
Hemoglobin: 12.6 g/dL (ref 12.0–15.0)
Potassium: 3.5 mEq/L — ABNORMAL LOW (ref 3.7–5.3)
Sodium: 136 mEq/L — ABNORMAL LOW (ref 137–147)
TCO2: 24 mmol/L (ref 0–100)

## 2014-03-08 LAB — IRON AND TIBC
IRON: 22 ug/dL — AB (ref 42–135)
Saturation Ratios: 9 % — ABNORMAL LOW (ref 20–55)
TIBC: 232 ug/dL — ABNORMAL LOW (ref 250–470)
UIBC: 210 ug/dL (ref 125–400)

## 2014-03-08 LAB — PROTIME-INR
INR: 1.04 (ref 0.00–1.49)
Prothrombin Time: 13.6 seconds (ref 11.6–15.2)

## 2014-03-08 LAB — GLUCOSE, CAPILLARY
GLUCOSE-CAPILLARY: 76 mg/dL (ref 70–99)
Glucose-Capillary: 236 mg/dL — ABNORMAL HIGH (ref 70–99)
Glucose-Capillary: 202 mg/dL — ABNORMAL HIGH (ref 70–99)
Glucose-Capillary: 260 mg/dL — ABNORMAL HIGH (ref 70–99)

## 2014-03-08 LAB — VITAMIN B12: Vitamin B-12: 1878 pg/mL — ABNORMAL HIGH (ref 211–911)

## 2014-03-08 LAB — FOLATE

## 2014-03-08 LAB — MRSA PCR SCREENING: MRSA BY PCR: NEGATIVE

## 2014-03-08 LAB — FERRITIN: Ferritin: 142 ng/mL (ref 10–291)

## 2014-03-08 SURGERY — IRRIGATION AND DEBRIDEMENT ABSCESS
Anesthesia: General | Site: Abdomen

## 2014-03-08 MED ORDER — ROCURONIUM BROMIDE 100 MG/10ML IV SOLN
INTRAVENOUS | Status: AC
  Filled 2014-03-08: qty 1

## 2014-03-08 MED ORDER — MAGIC MOUTHWASH
15.0000 mL | Freq: Four times a day (QID) | ORAL | Status: DC | PRN
  Filled 2014-03-08: qty 15

## 2014-03-08 MED ORDER — DEXTROSE 5 % IV SOLN
100.0000 mg | Freq: Two times a day (BID) | INTRAVENOUS | Status: DC
  Administered 2014-03-08: 100 mg via INTRAVENOUS
  Filled 2014-03-08 (×2): qty 100

## 2014-03-08 MED ORDER — DEXAMETHASONE SODIUM PHOSPHATE 10 MG/ML IJ SOLN
INTRAMUSCULAR | Status: AC
  Filled 2014-03-08: qty 1

## 2014-03-08 MED ORDER — SODIUM CHLORIDE 0.9 % IJ SOLN
INTRAMUSCULAR | Status: AC
  Filled 2014-03-08: qty 10

## 2014-03-08 MED ORDER — PROPOFOL 10 MG/ML IV BOLUS
INTRAVENOUS | Status: AC
  Filled 2014-03-08: qty 20

## 2014-03-08 MED ORDER — OXYCODONE HCL 5 MG PO TABS
5.0000 mg | ORAL_TABLET | ORAL | Status: DC | PRN
  Administered 2014-03-08: 10 mg via ORAL
  Filled 2014-03-08: qty 2

## 2014-03-08 MED ORDER — INSULIN ASPART PROT & ASPART (70-30 MIX) 100 UNIT/ML ~~LOC~~ SUSP
50.0000 [IU] | Freq: Two times a day (BID) | SUBCUTANEOUS | Status: DC
  Administered 2014-03-08 – 2014-03-11 (×6): 50 [IU] via SUBCUTANEOUS
  Filled 2014-03-08: qty 10

## 2014-03-08 MED ORDER — FENTANYL CITRATE 0.05 MG/ML IJ SOLN
INTRAMUSCULAR | Status: AC
  Administered 2014-03-08: 17:00:00
  Filled 2014-03-08: qty 2

## 2014-03-08 MED ORDER — MIDAZOLAM HCL 2 MG/2ML IJ SOLN
INTRAMUSCULAR | Status: AC
  Filled 2014-03-08: qty 2

## 2014-03-08 MED ORDER — MENTHOL 3 MG MT LOZG: 1.0000 | LOZENGE | OROMUCOSAL | Status: DC | PRN

## 2014-03-08 MED ORDER — PIPERACILLIN-TAZOBACTAM 3.375 G IVPB
INTRAVENOUS | Status: AC
  Filled 2014-03-08: qty 50

## 2014-03-08 MED ORDER — BUPIVACAINE-EPINEPHRINE 0.25% -1:200000 IJ SOLN
INTRAMUSCULAR | Status: DC | PRN
  Administered 2014-03-08: 50 mL

## 2014-03-08 MED ORDER — ONDANSETRON HCL 4 MG PO TABS: 4.0000 mg | ORAL_TABLET | Freq: Four times a day (QID) | ORAL | Status: DC | PRN

## 2014-03-08 MED ORDER — LIDOCAINE HCL (CARDIAC) 20 MG/ML IV SOLN
INTRAVENOUS | Status: DC | PRN
  Administered 2014-03-08: 100 mg via INTRAVENOUS

## 2014-03-08 MED ORDER — FENTANYL CITRATE 0.05 MG/ML IJ SOLN
INTRAMUSCULAR | Status: AC
  Filled 2014-03-08: qty 5

## 2014-03-08 MED ORDER — 0.9 % SODIUM CHLORIDE (POUR BTL) OPTIME
TOPICAL | Status: DC | PRN
  Administered 2014-03-08: 1000 mL

## 2014-03-08 MED ORDER — PHENOL 1.4 % MT LIQD: 2.0000 | OROMUCOSAL | Status: DC | PRN

## 2014-03-08 MED ORDER — ATROPINE SULFATE 0.4 MG/ML IJ SOLN
INTRAMUSCULAR | Status: AC
  Filled 2014-03-08: qty 1

## 2014-03-08 MED ORDER — LACTATED RINGERS IV SOLN: Freq: Once | INTRAVENOUS | Status: DC

## 2014-03-08 MED ORDER — BUPIVACAINE-EPINEPHRINE 0.25% -1:200000 IJ SOLN
INTRAMUSCULAR | Status: AC
  Filled 2014-03-08: qty 1

## 2014-03-08 MED ORDER — SODIUM CHLORIDE 0.9 % IJ SOLN
3.0000 mL | INTRAMUSCULAR | Status: DC | PRN
Start: 2014-03-08 — End: 2014-03-11

## 2014-03-08 MED ORDER — INSULIN ASPART 100 UNIT/ML ~~LOC~~ SOLN
0.0000 [IU] | Freq: Every day | SUBCUTANEOUS | Status: DC
  Administered 2014-03-08: 3 [IU] via SUBCUTANEOUS

## 2014-03-08 MED ORDER — SODIUM CHLORIDE 0.9 % IV SOLN
INTRAVENOUS | Status: DC
  Administered 2014-03-08: 04:00:00 via INTRAVENOUS

## 2014-03-08 MED ORDER — DIPHENHYDRAMINE HCL 50 MG/ML IJ SOLN: 12.5000 mg | Freq: Four times a day (QID) | INTRAMUSCULAR | Status: DC | PRN

## 2014-03-08 MED ORDER — ACETAMINOPHEN 325 MG PO TABS: 650.0000 mg | ORAL_TABLET | Freq: Four times a day (QID) | ORAL | Status: DC | PRN

## 2014-03-08 MED ORDER — ENOXAPARIN SODIUM 40 MG/0.4ML ~~LOC~~ SOLN
40.0000 mg | SUBCUTANEOUS | Status: DC
  Filled 2014-03-08 (×4): qty 0.4

## 2014-03-08 MED ORDER — LIDOCAINE HCL (CARDIAC) 20 MG/ML IV SOLN
INTRAVENOUS | Status: AC
  Filled 2014-03-08: qty 5

## 2014-03-08 MED ORDER — INSULIN ASPART 100 UNIT/ML ~~LOC~~ SOLN
0.0000 [IU] | Freq: Three times a day (TID) | SUBCUTANEOUS | Status: DC
Start: 2014-03-08 — End: 2014-03-11
  Administered 2014-03-08 (×2): 5 [IU] via SUBCUTANEOUS
  Administered 2014-03-09: 3 [IU] via SUBCUTANEOUS
  Administered 2014-03-09: 2 [IU] via SUBCUTANEOUS
  Administered 2014-03-10 (×2): 3 [IU] via SUBCUTANEOUS

## 2014-03-08 MED ORDER — VANCOMYCIN HCL 10 G IV SOLR
1250.0000 mg | Freq: Two times a day (BID) | INTRAVENOUS | Status: DC
  Administered 2014-03-08 – 2014-03-11 (×6): 1250 mg via INTRAVENOUS
  Filled 2014-03-08 (×8): qty 1250

## 2014-03-08 MED ORDER — EPHEDRINE SULFATE 50 MG/ML IJ SOLN
INTRAMUSCULAR | Status: AC
  Filled 2014-03-08: qty 1

## 2014-03-08 MED ORDER — PIPERACILLIN-TAZOBACTAM 3.375 G IVPB
3.3750 g | Freq: Three times a day (TID) | INTRAVENOUS | Status: DC
  Administered 2014-03-08 – 2014-03-11 (×9): 3.375 g via INTRAVENOUS
  Filled 2014-03-08 (×10): qty 50

## 2014-03-08 MED ORDER — SODIUM CHLORIDE 0.9 % IV SOLN
250.0000 mL | INTRAVENOUS | Status: DC | PRN
  Administered 2014-03-09: 250 mL via INTRAVENOUS

## 2014-03-08 MED ORDER — KETOROLAC TROMETHAMINE 30 MG/ML IJ SOLN: 15.0000 mg | Freq: Once | INTRAMUSCULAR | Status: DC | PRN

## 2014-03-08 MED ORDER — FENTANYL CITRATE 0.05 MG/ML IJ SOLN
25.0000 ug | INTRAMUSCULAR | Status: DC | PRN
  Administered 2014-03-08: 50 ug via INTRAVENOUS

## 2014-03-08 MED ORDER — PHENYLEPHRINE HCL 10 MG/ML IJ SOLN
INTRAMUSCULAR | Status: DC | PRN
  Administered 2014-03-08 (×3): 80 ug via INTRAVENOUS

## 2014-03-08 MED ORDER — SODIUM CHLORIDE 0.9 % IV SOLN
INTRAVENOUS | Status: DC
  Administered 2014-03-08 – 2014-03-10 (×4): via INTRAVENOUS
  Filled 2014-03-08 (×6): qty 1000

## 2014-03-08 MED ORDER — FENTANYL CITRATE 0.05 MG/ML IJ SOLN
INTRAMUSCULAR | Status: DC | PRN
  Administered 2014-03-08 (×3): 25 ug via INTRAVENOUS

## 2014-03-08 MED ORDER — FUROSEMIDE 10 MG/ML IJ SOLN
20.0000 mg | Freq: Once | INTRAMUSCULAR | Status: DC
Start: 2014-03-08 — End: 2014-03-08

## 2014-03-08 MED ORDER — LOSARTAN POTASSIUM 50 MG PO TABS
50.0000 mg | ORAL_TABLET | Freq: Every day | ORAL | Status: DC
  Administered 2014-03-08 – 2014-03-09 (×2): 50 mg via ORAL
  Filled 2014-03-08 (×2): qty 1

## 2014-03-08 MED ORDER — LACTATED RINGERS IV BOLUS (SEPSIS): 1000.0000 mL | Freq: Three times a day (TID) | INTRAVENOUS | Status: AC | PRN

## 2014-03-08 MED ORDER — LACTATED RINGERS IV SOLN
INTRAVENOUS | Status: DC | PRN
  Administered 2014-03-08: 15:00:00 via INTRAVENOUS

## 2014-03-08 MED ORDER — PROPOFOL 10 MG/ML IV BOLUS
INTRAVENOUS | Status: DC | PRN
  Administered 2014-03-08: 200 mg via INTRAVENOUS

## 2014-03-08 MED ORDER — ACETAMINOPHEN 650 MG RE SUPP: 650.0000 mg | Freq: Four times a day (QID) | RECTAL | Status: DC | PRN

## 2014-03-08 MED ORDER — CLINDAMYCIN PHOSPHATE 600 MG/50ML IV SOLN
600.0000 mg | Freq: Once | INTRAVENOUS | Status: AC
  Administered 2014-03-08: 600 mg via INTRAVENOUS
  Filled 2014-03-08: qty 50

## 2014-03-08 MED ORDER — ONDANSETRON HCL 4 MG/2ML IJ SOLN
INTRAMUSCULAR | Status: DC | PRN
  Administered 2014-03-08: 4 mg via INTRAVENOUS

## 2014-03-08 MED ORDER — POLYETHYLENE GLYCOL 3350 17 G PO PACK
17.0000 g | PACK | Freq: Two times a day (BID) | ORAL | Status: DC | PRN
  Filled 2014-03-08: qty 1

## 2014-03-08 MED ORDER — LIP MEDEX EX OINT
1.0000 "application " | TOPICAL_OINTMENT | Freq: Two times a day (BID) | CUTANEOUS | Status: DC
  Administered 2014-03-09 – 2014-03-11 (×5): 1 via TOPICAL
  Filled 2014-03-08: qty 7

## 2014-03-08 MED ORDER — MIDAZOLAM HCL 5 MG/5ML IJ SOLN
INTRAMUSCULAR | Status: DC | PRN
  Administered 2014-03-08: 2 mg via INTRAVENOUS

## 2014-03-08 MED ORDER — ALUM & MAG HYDROXIDE-SIMETH 200-200-20 MG/5ML PO SUSP
30.0000 mL | Freq: Four times a day (QID) | ORAL | Status: DC | PRN
  Filled 2014-03-08: qty 30

## 2014-03-08 MED ORDER — SODIUM CHLORIDE 0.9 % IJ SOLN: 3.0000 mL | Freq: Two times a day (BID) | INTRAMUSCULAR | Status: DC

## 2014-03-08 MED ORDER — ASPIRIN 81 MG PO CHEW
81.0000 mg | CHEWABLE_TABLET | Freq: Every day | ORAL | Status: DC
  Administered 2014-03-08 – 2014-03-11 (×4): 81 mg via ORAL
  Filled 2014-03-08 (×4): qty 1

## 2014-03-08 MED ORDER — FERROUS SULFATE 325 (65 FE) MG PO TABS
325.0000 mg | ORAL_TABLET | Freq: Every day | ORAL | Status: DC
  Administered 2014-03-08 – 2014-03-11 (×4): 325 mg via ORAL
  Filled 2014-03-08 (×5): qty 1

## 2014-03-08 MED ORDER — CIPROFLOXACIN IN D5W 400 MG/200ML IV SOLN
400.0000 mg | Freq: Two times a day (BID) | INTRAVENOUS | Status: DC
  Administered 2014-03-08: 400 mg via INTRAVENOUS
  Filled 2014-03-08 (×2): qty 200

## 2014-03-08 MED ORDER — BISACODYL 10 MG RE SUPP
10.0000 mg | Freq: Two times a day (BID) | RECTAL | Status: DC | PRN
  Filled 2014-03-08: qty 1

## 2014-03-08 MED ORDER — ONDANSETRON HCL 4 MG/2ML IJ SOLN
INTRAMUSCULAR | Status: AC
  Filled 2014-03-08: qty 2

## 2014-03-08 MED ORDER — SODIUM CHLORIDE 0.9 % IV SOLN: Freq: Once | INTRAVENOUS | Status: DC

## 2014-03-08 MED ORDER — FENTANYL CITRATE 0.05 MG/ML IJ SOLN
25.0000 ug | INTRAMUSCULAR | Status: DC | PRN
  Administered 2014-03-08 – 2014-03-09 (×3): 50 ug via INTRAVENOUS
  Administered 2014-03-10: 25 ug via INTRAVENOUS
  Filled 2014-03-08 (×4): qty 2

## 2014-03-08 MED ORDER — PROMETHAZINE HCL 25 MG/ML IJ SOLN: 6.2500 mg | INTRAMUSCULAR | Status: DC | PRN

## 2014-03-08 MED ORDER — ONDANSETRON HCL 4 MG/2ML IJ SOLN: 4.0000 mg | Freq: Four times a day (QID) | INTRAMUSCULAR | Status: DC | PRN

## 2014-03-08 MED ORDER — PANTOPRAZOLE SODIUM 40 MG PO TBEC
40.0000 mg | DELAYED_RELEASE_TABLET | Freq: Every day | ORAL | Status: DC
  Administered 2014-03-08 – 2014-03-11 (×4): 40 mg via ORAL
  Filled 2014-03-08 (×4): qty 1

## 2014-03-08 SURGICAL SUPPLY — 27 items
BNDG CONFORM 3 STRL LF (GAUZE/BANDAGES/DRESSINGS) ×6 IMPLANT
DRAPE LAPAROSCOPIC ABDOMINAL (DRAPES) ×2 IMPLANT
GAUZE SPONGE 4X4 12PLY STRL (GAUZE/BANDAGES/DRESSINGS) ×2 IMPLANT
GLOVE BIO SURGEON STRL SZ 6 (GLOVE) IMPLANT
GLOVE BIOGEL PI IND STRL 6.5 (GLOVE) ×1 IMPLANT
GLOVE BIOGEL PI IND STRL 7.0 (GLOVE) ×2 IMPLANT
GLOVE BIOGEL PI INDICATOR 6.5 (GLOVE) ×1
GLOVE BIOGEL PI INDICATOR 7.0 (GLOVE) ×2
GLOVE ECLIPSE 8.0 STRL XLNG CF (GLOVE) ×2 IMPLANT
GLOVE INDICATOR 6.5 STRL GRN (GLOVE) IMPLANT
GLOVE INDICATOR 8.0 STRL GRN (GLOVE) ×2 IMPLANT
GLOVE SURG SS PI 6.5 STRL IVOR (GLOVE) ×2 IMPLANT
GOWN SPEC L4 XLG W/TWL (GOWN DISPOSABLE) ×4 IMPLANT
GOWN STRL REUS W/ TWL XL LVL3 (GOWN DISPOSABLE) IMPLANT
GOWN STRL REUS W/TWL 2XL LVL3 (GOWN DISPOSABLE) IMPLANT
GOWN STRL REUS W/TWL XL LVL3 (GOWN DISPOSABLE)
KIT BASIN OR (CUSTOM PROCEDURE TRAY) ×2 IMPLANT
MANIFOLD NEPTUNE II (INSTRUMENTS) ×2 IMPLANT
NEEDLE HYPO 22GX1.5 SAFETY (NEEDLE) ×2 IMPLANT
NS IRRIG 1000ML POUR BTL (IV SOLUTION) ×2 IMPLANT
PACK GENERAL/GYN (CUSTOM PROCEDURE TRAY) ×2 IMPLANT
PAD ABD 8X10 STRL (GAUZE/BANDAGES/DRESSINGS) ×2 IMPLANT
SWAB COLLECTION DEVICE MRSA (MISCELLANEOUS) ×2 IMPLANT
SYRINGE 20CC LL (MISCELLANEOUS) ×2 IMPLANT
TAPE CLOTH SURG 4X10 WHT LF (GAUZE/BANDAGES/DRESSINGS) ×2 IMPLANT
TOWEL OR 17X26 10 PK STRL BLUE (TOWEL DISPOSABLE) ×2 IMPLANT
TUBE ANAEROBIC SPECIMEN COL (MISCELLANEOUS) ×2 IMPLANT

## 2014-03-08 NOTE — Progress Notes (Signed)
ANTIBIOTIC CONSULT NOTE - INITIAL  Pharmacy Consult for Vancomycin and Zosyn Indication: Cellulitis  Allergies  Allergen Reactions  . Biaxin [Clarithromycin]     Makes her feel blah    Patient Measurements: Height: 5\' 3"  (160 cm) Weight: 209 lb 1.6 oz (94.847 kg) IBW/kg (Calculated) : 52.4  Vital Signs: Temp: 98.5 F (36.9 C) (07/31 1341) Temp src: Oral (07/31 1341) BP: 133/92 mmHg (07/31 1341) Pulse Rate: 82 (07/31 1341) Intake/Output from previous day: 07/30 0701 - 07/31 0700 In: 270 [I.V.:20; IV Piggyback:250] Out: -  Intake/Output from this shift:    Labs:  Recent Labs  03/08/14 0047 03/08/14 0057 03/08/14 0500  WBC 9.8  --  10.0  HGB 11.1* 12.6 10.3*  PLT 307  --  297  CREATININE  --  0.80 0.69   Estimated Creatinine Clearance: 83 ml/min (by C-G formula based on Cr of 0.69). No results found for this basename: VANCOTROUGH, Corlis Leak, VANCORANDOM, GENTTROUGH, GENTPEAK, GENTRANDOM, TOBRATROUGH, TOBRAPEAK, TOBRARND, AMIKACINPEAK, AMIKACINTROU, AMIKACIN,  in the last 72 hours   Microbiology: Recent Results (from the past 720 hour(s))  CULTURE, ROUTINE-ABSCESS     Status: None   Collection Time    03/08/14  1:58 AM      Result Value Ref Range Status   Specimen Description ABDOMEN   Final   Special Requests NONE   Final   Gram Stain     Final   Value: ABUNDANT WBC PRESENT,BOTH PMN AND MONONUCLEAR     NO SQUAMOUS EPITHELIAL CELLS SEEN     FEW GRAM POSITIVE COCCI     IN PAIRS FEW GRAM VARIABLE ROD     Performed at [facility]   Culture PENDING   Incomplete   Report Status PENDING   Incomplete    Medical History: Past Medical History  Diagnosis Date  . GERD (gastroesophageal reflux disease)   . Hypertension   . Hyperlipidemia   . Obesity   . Diabetes mellitus without complication     Medications:  Scheduled:  . aspirin  81 mg Oral Daily  . enoxaparin (LOVENOX) injection  40 mg Subcutaneous Q24H  . ferrous sulfate  325 mg Oral Q  breakfast  . insulin aspart  0-15 Units Subcutaneous TID WC  . insulin aspart  0-5 Units Subcutaneous QHS  . insulin aspart protamine- aspart  50 Units Subcutaneous BID  . losartan  50 mg Oral Daily  . pantoprazole  40 mg Oral Daily  . piperacillin-tazobactam (ZOSYN)  IV  3.375 g Intravenous Q8H  . vancomycin  1,250 mg Intravenous Q12H   Assessment: 58 year old female with a history of diabetes mellitus, HTN, obesity and HLD who was admitted last night with an abdominal wall abscess. The patient reports developing erythema and pain to her abdomen after administering insulin last Sunday. It worsened with time. She was seen by her PCP on Monday and started on doxycycline. Despite this, the redness increased. She reports yesterday, the the abscess began draining spontaneously. Originally started on IV Cipro when admitted then discontinued and pharmacy consulted to dose Vanc and Zoxyn.   Goal of Therapy:   Vancomycin and Zosyn per renal dosing guidelines  Eradication of infection  Plan:   Vancomycin 1250mg  IV q12h  Zosyn 3.375mg  IV q8h (EI)  Monitor renal function, follow cultures  Vanc trough as needed   Dolly Rias RPh 03/08/2014, 2:37 PM Pager [phone]

## 2014-03-08 NOTE — Consult Note (Signed)
Reason for Consult: abdominal wall abscess  Referring Physician: Dr. Irine Seal    HPI: Dominique Dillon is a 58 year old female with a history of diabetes mellitus, HTN, obesity and HLD who was admitted last night with an abdominal wall abscess.  The patient reports developing erythema and pain to her abdomen after administering insulin last Sunday.  It worsened with time.  She was seen by her PCP on Monday and started on doxycycline.  Despite this, the redness increased.  She reports yesterday, the the abscess began draining spontaneously.  She reports subjective fever and chills.  Her appetite has been adequate.  She had breakfast this AM.  She denies previous symptoms like this.  Symptoms are moderate in severity.  No aggravating or alleviating factors.  No modifying factors at home.  She underwent an I&D last night in the ED.  She was started on IV antibiotics and admitted.  She reports severe pain with the I&D, little improvement overnight.  She is afebrile. VSS.  We have been asked to evaluate the patient for possible incision and drainage.    Past Medical History  Diagnosis Date  . GERD (gastroesophageal reflux disease)   . Hypertension   . Hyperlipidemia   . Obesity   . Diabetes mellitus without complication     Past Surgical History  Procedure Laterality Date  . C section x 2    . Explorartory lap    . Bilatreral knee surgery    . Abdominal hysterectomy    . Cholecystectomy    . Abdominal surgery    . Appendectomy    . Tonsillectomy      Family History  Problem Relation Age of Onset  . Asthma Other   . Cancer Other   . Hyperlipidemia Other   . Heart disease Other   . Hypertension Other   . Stroke Other     Social History:  reports that she has never smoked. She has never used smokeless tobacco. She reports that she does not drink alcohol or use illicit drugs.  Allergies:  Allergies  Allergen Reactions  . Biaxin [Clarithromycin]     Makes her feel blah     Medications:  Scheduled Meds: . sodium chloride   Intravenous STAT  . sodium chloride   Intravenous Once  . aspirin  81 mg Oral Daily  . ciprofloxacin  400 mg Intravenous Q12H  . doxycycline (VIBRAMYCIN) IV  100 mg Intravenous Q12H  . enoxaparin (LOVENOX) injection  40 mg Subcutaneous Q24H  . ferrous sulfate  325 mg Oral Q breakfast  . insulin aspart  0-15 Units Subcutaneous TID WC  . insulin aspart  0-5 Units Subcutaneous QHS  . insulin aspart protamine- aspart  50 Units Subcutaneous BID  . losartan  50 mg Oral Daily  . pantoprazole  40 mg Oral Daily   Continuous Infusions:  PRN Meds:.acetaminophen, acetaminophen, ondansetron (ZOFRAN) IV, ondansetron   Results for orders placed during the hospital encounter of 03/07/14 (from the past 48 hour(s))  CBG MONITORING, ED     Status: Abnormal   Collection Time    03/07/14 11:59 PM      Result Value Ref Range   Glucose-Capillary 168 (*) 70 - 99 mg/dL  CBC WITH DIFFERENTIAL     Status: Abnormal   Collection Time    03/08/14 12:47 AM      Result Value Ref Range   WBC 9.8  4.0 - 10.5 K/uL   RBC 4.76  3.87 - 5.11 MIL/uL  Hemoglobin 11.1 (*) 12.0 - 15.0 g/dL   HCT 33.5 (*) 36.0 - 46.0 %   MCV 70.4 (*) 78.0 - 100.0 fL   MCH 23.3 (*) 26.0 - 34.0 pg   MCHC 33.1  30.0 - 36.0 g/dL   RDW 13.9  11.5 - 15.5 %   Platelets 307  150 - 400 K/uL   Neutrophils Relative % 72  43 - 77 %   Lymphocytes Relative 19  12 - 46 %   Monocytes Relative 7  3 - 12 %   Eosinophils Relative 2  0 - 5 %   Basophils Relative 0  0 - 1 %   Neutro Abs 7.0  1.7 - 7.7 K/uL   Lymphs Abs 1.9  0.7 - 4.0 K/uL   Monocytes Absolute 0.7  0.1 - 1.0 K/uL   Eosinophils Absolute 0.2  0.0 - 0.7 K/uL   Basophils Absolute 0.0  0.0 - 0.1 K/uL   Smear Review MORPHOLOGY UNREMARKABLE    I-STAT CHEM 8, ED     Status: Abnormal   Collection Time    03/08/14 12:57 AM      Result Value Ref Range   Sodium 136 (*) 137 - 147 mEq/L   Potassium 3.5 (*) 3.7 - 5.3 mEq/L   Chloride  99  96 - 112 mEq/L   BUN 14  6 - 23 mg/dL   Creatinine, Ser 0.80  0.50 - 1.10 mg/dL   Glucose, Bld 202 (*) 70 - 99 mg/dL   Calcium, Ion 1.25 (*) 1.12 - 1.23 mmol/L   TCO2 24  0 - 100 mmol/L   Hemoglobin 12.6  12.0 - 15.0 g/dL   HCT 37.0  36.0 - 46.0 %  COMPREHENSIVE METABOLIC PANEL     Status: Abnormal   Collection Time    03/08/14  5:00 AM      Result Value Ref Range   Sodium 137  137 - 147 mEq/L   Potassium 3.9  3.7 - 5.3 mEq/L   Chloride 99  96 - 112 mEq/L   CO2 23  19 - 32 mEq/L   Glucose, Bld 194 (*) 70 - 99 mg/dL   BUN 13  6 - 23 mg/dL   Creatinine, Ser 0.69  0.50 - 1.10 mg/dL   Calcium 9.6  8.4 - 10.5 mg/dL   Total Protein 7.1  6.0 - 8.3 g/dL   Albumin 3.1 (*) 3.5 - 5.2 g/dL   AST 12  0 - 37 U/L   ALT 8  0 - 35 U/L   Alkaline Phosphatase 90  39 - 117 U/L   Total Bilirubin 0.6  0.3 - 1.2 mg/dL   GFR calc non Af Amer >90  >90 mL/min   GFR calc Af Amer >90  >90 mL/min   Comment: (NOTE)     The eGFR has been calculated using the CKD EPI equation.     This calculation has not been validated in all clinical situations.     eGFR's persistently <90 mL/min signify possible Chronic Kidney     Disease.   Anion gap 15  5 - 15  CBC     Status: Abnormal   Collection Time    03/08/14  5:00 AM      Result Value Ref Range   WBC 10.0  4.0 - 10.5 K/uL   RBC 4.39  3.87 - 5.11 MIL/uL   Hemoglobin 10.3 (*) 12.0 - 15.0 g/dL   Comment: RESULT REPEATED AND VERIFIED  DELTA CHECK NOTED   HCT 31.4 (*) 36.0 - 46.0 %   MCV 71.5 (*) 78.0 - 100.0 fL   MCH 23.2 (*) 26.0 - 34.0 pg   MCHC 32.5  30.0 - 36.0 g/dL   RDW 13.9  11.5 - 15.5 %   Platelets 297  150 - 400 K/uL  PROTIME-INR     Status: None   Collection Time    03/08/14  5:00 AM      Result Value Ref Range   Prothrombin Time 13.6  11.6 - 15.2 seconds   INR 1.04  0.00 - 1.49  GLUCOSE, CAPILLARY     Status: Abnormal   Collection Time    03/08/14  7:42 AM      Result Value Ref Range   Glucose-Capillary 236 (*) 70 - 99 mg/dL    Comment 1 Notify RN    VITAMIN B12     Status: Abnormal   Collection Time    03/08/14  8:25 AM      Result Value Ref Range   Vitamin B-12 1878 (*) 211 - 911 pg/mL   Comment: Performed at [facility]     Status: None   Collection Time    03/08/14  8:25 AM      Result Value Ref Range   Folate >20.0     Comment: (NOTE)     Reference Ranges            Deficient:       0.4 - 3.3 ng/mL            Indeterminate:   3.4 - 5.4 ng/mL            Normal:              > 5.4 ng/mL     Performed at [place]     Status: None   Collection Time    03/08/14  8:25 AM      Result Value Ref Range   Ferritin 142  10 - 291 ng/mL   Comment: Performed at [place], CAPILL[facility]     Status: Abnormal   Collection Time    03/08/14 11:47 AM      Result Value Ref Range   Glucose-Capillary 202 (*) 70 - 99 mg/dL    No results found.  Review of Systems  All other systems reviewed and are negative.  Blood pressure 133/92, pulse 82, temperature 98.5 F (36.9 C), temperature source Oral, resp. rate 16, height 5' 3" (1.6 m), weight 209 lb 1.6 oz (94.847 kg), SpO2 100.00%. Physical Exam  Constitutional: She is oriented to person, place, and time. She appears well-developed and well-nourished. No distress.  Neck: Normal range of motion. Neck supple.  Cardiovascular: Normal rate, regular rhythm, normal heart sounds and intact distal pulses.  Exam reveals no gallop and no friction rub.   No murmur heard. Respiratory: Effort normal and breath sounds normal. No respiratory distress. She has no wheezes. She has no rales. She exhibits no tenderness.  GI: Soft. Bowel sounds are normal. She exhibits no distension and no mass. There is no rebound and no guarding.  1cm opening that is 2cm in depth.  There is surrounding induration and erythema that is 9x10cm.  Very tender to touch.  There is purulent drainage on the packing.    Musculoskeletal: Normal range of  motion. She exhibits no edema and no tenderness.  Neurological: She is alert and oriented  to person, place, and time.  Skin: Skin is warm and dry. She is not diaphoretic.  Psychiatric: She has a normal mood and affect. Her behavior is normal. Judgment and thought content normal.    Assessment/Plan: Diabetes mellitus Hypertension Obesity Abdominal wall abscess -It does not appear to be adequately draining and has a 9x10cm area of surrounding induration and erythema.  Recommend incision and drainage in OR later today.   -NPO -continue with IV antibiotics -follow cultures -hold lovenox -obtain consent, risks of the procedure including but not limited to infection, bleeding, need for further debridement were reviewed.  She verbalizes understanding and wishes to proceed.    Shardee Dieu ANP-BC 03/08/2014, 1:51 PM

## 2014-03-08 NOTE — Anesthesia Postprocedure Evaluation (Signed)
  Anesthesia Post-op Note  Patient: Dominique Dillon  Procedure(s) Performed: Procedure(s) (LRB): IRRIGATION AND DEBRIDEMENT ABDOMINAL WALL ABSCESS (N/A)  Patient Location: PACU  Anesthesia Type: General  Level of Consciousness: awake and alert   Airway and Oxygen Therapy: Patient Spontanous Breathing  Post-op Pain: mild  Post-op Assessment: Post-op Vital signs reviewed, Patient's Cardiovascular Status Stable, Respiratory Function Stable, Patent Airway and No signs of Nausea or vomiting  Last Vitals:  Filed Vitals:   03/08/14 1615  BP: 108/58  Pulse: 90  Temp:   Resp: 20    Post-op Vital Signs: stable   Complications: No apparent anesthesia complications

## 2014-03-08 NOTE — Progress Notes (Signed)
ANTIBIOTIC CONSULT NOTE - INITIAL  Pharmacy Consult for Cipro Indication: Cellulitis  Allergies  Allergen Reactions  . Biaxin [Clarithromycin]     Makes her feel blah    Patient Measurements: Height: 5\' 3"  (160 cm) Weight: 209 lb 1.6 oz (94.847 kg) IBW/kg (Calculated) : 52.4   Vital Signs: Temp: 98.2 F (36.8 C) (07/31 0227) Temp src: Oral (07/31 0227) BP: 114/65 mmHg (07/31 0227) Pulse Rate: 95 (07/31 0227) Intake/Output from previous day:   Intake/Output from this shift:    Labs:  Recent Labs  03/08/14 0047 03/08/14 0057  WBC 9.8  --   HGB 11.1* 12.6  PLT 307  --   CREATININE  --  0.80   Estimated Creatinine Clearance: 83 ml/min (by C-G formula based on Cr of 0.8). No results found for this basename: VANCOTROUGH, VANCOPEAK, VANCORANDOM, GENTTROUGH, GENTPEAK, GENTRANDOM, TOBRATROUGH, TOBRAPEAK, TOBRARND, AMIKACINPEAK, AMIKACINTROU, AMIKACIN,  in the last 72 hours   Microbiology: No results found for this or any previous visit (from the past 720 hour(s)).  Medical History: Past Medical History  Diagnosis Date  . GERD (gastroesophageal reflux disease)   . Hypertension   . Hyperlipidemia   . Obesity   . Diabetes mellitus without complication     Medications:  Scheduled:  . sodium chloride   Intravenous STAT  . sodium chloride   Intravenous Once  . aspirin  81 mg Oral Daily  . ciprofloxacin  400 mg Intravenous Q12H  . doxycycline (VIBRAMYCIN) IV  100 mg Intravenous Q12H  . enoxaparin (LOVENOX) injection  40 mg Subcutaneous Q24H  . ferrous sulfate  325 mg Oral Q breakfast  . insulin aspart  0-15 Units Subcutaneous TID WC  . insulin aspart  0-5 Units Subcutaneous QHS  . insulin aspart protamine- aspart  50 Units Subcutaneous BID  . losartan  50 mg Oral Daily  . pantoprazole  40 mg Oral Daily   Infusions:   Assessment: 43 yoF with  Hx DM admitted with abscess. Doxy per MD (pt started outpt) and Cipro per Rx.  Goal of Therapy:  Treat  infection  Plan:   Cipro 400mg  IV q12h  F/U SCr/cultures as needed  Dorrene German 03/08/2014,3:24 AM

## 2014-03-08 NOTE — Op Note (Signed)
03/07/2014 - 03/08/2014  3:51 PM  PATIENT:  Dominique Dillon  58 y.o. female  Patient Care Team: Reinaldo Meeker, [facility] as PCP - General (Pharmacist)  PRE-OPERATIVE DIAGNOSIS:  abdominal wall abcess  POST-OPERATIVE DIAGNOSIS:  abdominal wall abscess  PROCEDURE:  Procedure(s): IRRIGATION AND DEBRIDEMENT ABDOMINAL WALL ABSCESS  SURGEON:  Surgeon(s): Adin Hector, MD  ASSISTANT: RN   ANESTHESIA:   local and general  EBL:     Delay start of Pharmacological VTE agent (>24hrs) due to surgical blood loss or risk of bleeding:  no  DRAINS: none   SPECIMEN:  Source of Specimen:  WOUND ABSCESS  DISPOSITION OF SPECIMEN:  MICROBIOLOGY  COUNTS:  YES  PLAN OF CARE: Admit for overnight observation  PATIENT DISPOSITION:  PACU - hemodynamically stable.  INDICATION:Pleasant morbidly obese insulin requiring diabetic with abdominal wall abscess near insulin shot site.  Underwent incision and drainage and emergency room.  Progressively worsened.  Admitted.  Placed on IV antibiotics.  Abscess large.  Recommendation made for operative record drainage and exploration:  The anatomy and physiology of skin abscesses was discussed. Pathophysiology of SQ abscess, possible progression to fasciitis & sepsis, etc discussed . I stressed good hygiene & wound care. Possible redebridement was discussed as well.   Possibility of recurrence was discussed. Risks, benefits, alternatives were discussed. I noted a good likelihood this will help address the problem. Risks of anesthesia and other risks discussed. Questions answered. The patient is does wish to proceed.   OR FINDINGS:   8cm deep abscess primarily in subcutaneous tissues with undermining.  Tracked down to fascia.  Fascia intact.  No fasciitis.  No necrosis  Wound measures 6cm by 3cm.  It is 15x10cm wide at the base.   DESCRIPTION:   Informed consent was confirmed. The patient received IV antibiotics. The patient underwent general anesthesia without any  difficulty. The patient was positioned supine. SCDs were active during the entire case. The area around the abscess was prepped and draped in a sterile fashion. A surgical timeout confirmed our plan.   I made an incision over the most fluctuant area of the mass transversely.   I placed my finger into the abscess cavity to break up loculations.  The incision was extended to adequately expose the entire cavity.    We did copious irrigation. The fascia was viable.  I sharply debrided out some necrotic fat with scalpel and scissors.   I did sharp debridement of skin with a scalpel to allow a nice 6cm by 3cm open wound. We took extra care to ensure hemostasis.   The wound was packed with 3" Kling rolled antibiotic-soaked rolled gauze x2 rolls.  Sterile dressings applied.  Patient is being extubated go to recovery room.   We plan to continue IV antibiotics and begin wound care training tomorrow.   I am about to discuss OR findings with family per the patient's request

## 2014-03-08 NOTE — Progress Notes (Signed)
I have seen and assessed patient and agree with Dr. Serita Grit assessment and plan. Patient does have a significant induration on abdominal examination with worsening cellulitis and abscess, and as such I have contacted Gen. surgery who is taking the patient to the operating room for I and D and probable cultures. Will change antibiotics to IV vancomycin and IV Zosyn. Continue supportive care. Appreciate general surgery's input and recommendations.

## 2014-03-08 NOTE — Transfer of Care (Signed)
Immediate Anesthesia Transfer of Care Note  Patient: Dominique Dillon  Procedure(s) Performed: Procedure(s) (LRB): IRRIGATION AND DEBRIDEMENT ABDOMINAL WALL ABSCESS (N/A)  Patient Location: PACU  Anesthesia Type: General  Level of Consciousness: sedated, patient cooperative and responds to stimulation  Airway & Oxygen Therapy: Patient Spontanous Breathing and Patient connected to face mask oxgen  Post-op Assessment: Report given to PACU RN and Post -op Vital signs reviewed and stable  Post vital signs: Reviewed and stable  Complications: No apparent anesthesia complications

## 2014-03-08 NOTE — Anesthesia Preprocedure Evaluation (Addendum)
Anesthesia Evaluation  Patient identified by MRN, date of birth, ID band Patient awake    Reviewed: Allergy & Precautions, H&P , NPO status , Patient's Chart, lab work & pertinent test results, reviewed documented beta blocker date and time   Airway Mallampati: II TM Distance: >3 FB Neck ROM: Full    Dental no notable dental hx.    Pulmonary neg pulmonary ROS,  breath sounds clear to auscultation  Pulmonary exam normal       Cardiovascular hypertension, Pt. on medications Rhythm:Regular Rate:Normal     Neuro/Psych negative neurological ROS  negative psych ROS   GI/Hepatic Neg liver ROS, GERD-  Medicated,  Endo/Other  diabetes, Type 2, Insulin DependentMorbid obesity  Renal/GU negative Renal ROS  negative genitourinary   Musculoskeletal negative musculoskeletal ROS (+)   Abdominal   Peds negative pediatric ROS (+)  Hematology  (+) anemia ,   Anesthesia Other Findings   Reproductive/Obstetrics negative OB ROS                          Anesthesia Physical Anesthesia Plan  ASA: III  Anesthesia Plan: General   Post-op Pain Management:    Induction: Intravenous  Airway Management Planned: Oral ETT  Additional Equipment:   Intra-op Plan:   Post-operative Plan: Extubation in OR  Informed Consent: I have reviewed the patients History and Physical, chart, labs and discussed the procedure including the risks, benefits and alternatives for the proposed anesthesia with the patient or authorized representative who has indicated his/her understanding and acceptance.   Dental advisory given  Plan Discussed with: CRNA and Surgeon  Anesthesia Plan Comments:        Anesthesia Quick Evaluation

## 2014-03-08 NOTE — Discharge Instructions (Signed)
WOUND CARE  It is important that the wound be kept open.   -Keeping the skin edges apart will allow the wound to gradually heal from the base upwards.   - If the skin edges of the wound close too early, a new fluid pocket can form and infection can occur. -This is the reason to pack deeper wounds with gauze or ribbon -This is why drained wounds cannot be sewed closed right away  A healthy wound should form a lining of bright red "beefy" granulating tissue that will help shrink the wound and help the edges grow new skin into it.   -A little mucus / yellow discharge is normal (the body's natural way to try and form a scab) and should be gently washed off with soap and water with daily dressing changes.  -Green or foul smelling drainage implies bacterial colonization and can slow wound healing - a short course of antibiotic ointment (3-5 days) can help it clear up.  Call the doctor if it does not improve or worsens  -Avoid use of antibiotic ointments for more than a week as they can slow wound healing over time.    -Sometimes other wound care products will be used to reduce need for dressing changes and/or help clean up dirty wounds -Sometimes the surgeon needs to debride the wound in the office to remove dead or infected tissue out of the wound so it can heal more quickly and safely.    Change the dressing at least once a day -Wash the wound with mild soap and water gently every day.  It is good to shower or bathe the wound to help it clean out. -Use clean 4x4 gauze for medium/large wounds or ribbon plain NU-gauze for smaller wounds (it does not need to be sterile, just clean) -Keep the raw wound moist with a little saline or KY (saline) gel on the gauze.  -A dry wound will take longer to heal.  -Keep the skin dry around the wound to prevent breakdown and irritation. -Pack the wound down to the base -The goal is to keep the skin apart, not overpack the wound -Use a Q-tip or blunt-tipped kabob  stick toothpick to push the gauze down to the base in narrow or deep wounds   -Cover with a clean gauze and tape -paper or Medipore tape tend to be gentle on the skin -rotate the orientation of the tape to avoid repeated stress/trauma on the skin -using an ACE or Coban wrap on wounds on arms or legs can be used instead.  Complete all antibiotics through the entire prescription to help the infection heal and prevent new places of infection   Returning the see the surgeon is helpful to follow the healing process and help the wound close as fast as possible.   Abscess An abscess is an infected area that contains a collection of pus and debris.It can occur in almost any part of the body. An abscess is also known as a furuncle or boil. CAUSES  An abscess occurs when tissue gets infected. This can occur from blockage of oil or sweat glands, infection of hair follicles, or a minor injury to the skin. As the body tries to fight the infection, pus collects in the area and creates pressure under the skin. This pressure causes pain. People with weakened immune systems have difficulty fighting infections and get certain abscesses more often.  SYMPTOMS Usually an abscess develops on the skin and becomes a painful mass that is red,  warm, and tender. If the abscess forms under the skin, you may feel a moveable soft area under the skin. Some abscesses break open (rupture) on their own, but most will continue to get worse without care. The infection can spread deeper into the body and eventually into the bloodstream, causing you to feel ill.  DIAGNOSIS  Your caregiver will take your medical history and perform a physical exam. A sample of fluid may also be taken from the abscess to determine what is causing your infection. TREATMENT  Your caregiver may prescribe antibiotic medicines to fight the infection. However, taking antibiotics alone usually does not cure an abscess. Your caregiver may need to make a small  cut (incision) in the abscess to drain the pus. In some cases, gauze is packed into the abscess to reduce pain and to continue draining the area. HOME CARE INSTRUCTIONS   Only take over-the-counter or prescription medicines for pain, discomfort, or fever as directed by your caregiver.  If you were prescribed antibiotics, take them as directed. Finish them even if you start to feel better.  If gauze is used, follow your caregiver's directions for changing the gauze.  To avoid spreading the infection:  Keep your draining abscess covered with a bandage.  Wash your hands well.  Do not share personal care items, towels, or whirlpools with others.  Avoid skin contact with others.  Keep your skin and clothes clean around the abscess.  Keep all follow-up appointments as directed by your caregiver. SEEK MEDICAL CARE IF:   You have increased pain, swelling, redness, fluid drainage, or bleeding.  You have muscle aches, chills, or a general ill feeling.  You have a fever. MAKE SURE YOU:   Understand these instructions.  Will watch your condition.  Will get help right away if you are not doing well or get worse. Document Released: 05/05/2005 Document Revised: 01/25/2012 Document Reviewed: 10/08/2011 [facility] Patient Information 2015 [place], [place]. This information is not intended to replace advice given to you by your health care provider. Make sure you discuss any questions you have with your health care provider.

## 2014-03-08 NOTE — Consult Note (Signed)
Region of induration in the obese abdominal wall consistent with worsening cellulitis and abscess.  Agree with more definitive drainage exploration in the operating room.  I discussed with the patient:  The anatomy and physiology of skin abscesses was discussed. Pathophysiology of SQ abscess, possible progression to fasciitis & sepsis, etc discussed . I stressed good hygiene & wound care.  Possible redebridement was discussed as well.  Possibility of recurrence was discussed. Risks, benefits, alternatives were discussed. I noted a good likelihood this will help address the problem.   Risks of anesthesia and other risks discussed. Questions answered.  The patient is wishing to proceed.

## 2014-03-08 NOTE — H&P (Signed)
[facility] History and Physical  Patient: Dominique Dillon  XBJ:[id number]  DOB: August 30, 1955  DOS: the patient was seen and examined on 03/08/2014 PCP: Reinaldo Meeker [facility]  Chief Complaint: Abdominal wall infection  HPI: Dominique Dillon is a 58 y.o. female with Past medical history of diabetes, hypertension, dyslipidemia, obesity. Patient presented with complaints of stomach wall infection. She noted a red spot with some pain after she gave herself a regular insulin injection 5 days ago. Since it was progressively worsening she saw her PCP for the same and was given doxycycline which she has been taking for last 3 days. Today the infection has spread all over her abdomen and she had rupture of the infected bulla with some pus coming out and therefore she came to the hospital. Denies any rash anywhere else. Denies any fever or chills. Denies any nausea or vomiting. Denies any abdominal pain diarrhea constipation. Her sugar has been running in 250+ range since last few days. No similar infection in the past.  The patient is coming from home. And at her baseline independent for most of her ADL.  Review of Systems: as mentioned in the history of present illness.  A Comprehensive review of the other systems is negative.  Past Medical History  Diagnosis Date  . GERD (gastroesophageal reflux disease)   . Hypertension   . Hyperlipidemia   . Obesity   . Diabetes mellitus without complication    Past Surgical History  Procedure Laterality Date  . C section x 2    . Explorartory lap    . Bilatreral knee surgery    . Abdominal hysterectomy    . Cholecystectomy    . Abdominal surgery    . Appendectomy    . Tonsillectomy     Social History:  reports that she has never smoked. She has never used smokeless tobacco. She reports that she does not drink alcohol or use illicit drugs.  Allergies  Allergen Reactions  . Biaxin [Clarithromycin]     Makes her feel blah    Family History  Problem  Relation Age of Onset  . Asthma Other   . Cancer Other   . Hyperlipidemia Other   . Heart disease Other   . Hypertension Other   . Stroke Other     Prior to Admission medications   Medication Sig Start Date End Date Taking? Authorizing Provider  aspirin 81 MG tablet Take 81 mg by mouth daily.   Yes Historical Provider, MD  calcium elemental as carbonate (CALCIUM ANTACID ULTRA) 400 MG tablet Chew 1,000 mg by mouth 3 (three) times daily.   Yes Historical Provider, MD  cetirizine (ZYRTEC) 10 MG chewable tablet Chew 10 mg by mouth daily.   Yes Historical Provider, MD  Cholecalciferol (VITAMIN D) 1000 UNITS capsule Take 1,000 Units by mouth daily.   Yes Historical Provider, MD  doxycycline (VIBRAMYCIN) 100 MG capsule Take 100 mg by mouth 2 (two) times daily.  03/05/14  Yes Historical Provider, MD  DULoxetine (CYMBALTA) 60 MG capsule Take 60 mg by mouth once a week.    Yes Historical Provider, MD  ferrous sulfate 325 (65 FE) MG tablet Take 325 mg by mouth daily with breakfast.   Yes Historical Provider, MD  hydrochlorothiazide (HYDRODIURIL) 25 MG tablet Take 25 mg by mouth every other day.    Yes Historical Provider, MD  losartan (COZAAR) 50 MG tablet Take 50 mg by mouth daily.   Yes Historical Provider, MD  metFORMIN (GLUCOPHAGE) 1000 MG tablet Take 1,000  mg by mouth 2 (two) times daily with a meal.   Yes Historical Provider, MD  NOVOLOG MIX 70/30 FLEXPEN (70-30) 100 UNIT/ML Pen Inject 50 Units into the skin 2 (two) times daily.  01/30/14  Yes Historical Provider, MD  omega-3 acid ethyl esters (LOVAZA) 1 G capsule Take 1 g by mouth daily.   Yes Historical Provider, MD  pantoprazole (PROTONIX) 40 MG tablet Take 40 mg by mouth daily.   Yes Historical Provider, MD  Pitavastatin Calcium (LIVALO) 4 MG TABS Take 4 mg by mouth every other day.   Yes Historical Provider, MD    Physical Exam: Filed Vitals:   03/07/14 1939 03/08/14 0043 03/08/14 0227 03/08/14 0608  BP: 147/83 118/60 114/65 122/71   Pulse: 104 99 95 90  Temp: 98.1 F (36.7 C)  98.2 F (36.8 C) 98.4 F (36.9 C)  TempSrc: Oral  Oral Oral  Resp: 20 16 18 18   Height:   5\' 3"  (1.6 m)   Weight:   94.847 kg (209 lb 1.6 oz)   SpO2: 100% 99% 100% 96%    General: Alert, Awake and Oriented to Time, Place and Person. Appear in mild distress Eyes: PERRL ENT: Oral Mucosa clear moist. Neck: no JVD Cardiovascular: S1 and S2 Present, no Murmur, Peripheral Pulses Present Respiratory: Bilateral Air entry equal and Decreased, Clear to Auscultation, noCrackles, no wheezes Abdomen: Bowel Sound Present, Soft and no on deep palpation tenderness. Superficial tenderness around the lesion. Large area of abdominal wall involved with redness and induration and warmth.  Skin:  abdominal  Rash Extremities:  no Pedal edema,  no  calf tenderness Neurologic: Grossly no focal neuro deficit.  Labs on Admission:  CBC:  Recent Labs Lab 03/08/14 0047 03/08/14 0057 03/08/14 0500  WBC 9.8  --  10.0  NEUTROABS 7.0  --   --   HGB 11.1* 12.6 10.3*  HCT 33.5* 37.0 31.4*  MCV 70.4*  --  71.5*  PLT 307  --  297    CMP     Component Value Date/Time   NA 137 03/08/2014 0500   K 3.9 03/08/2014 0500   CL 99 03/08/2014 0500   CO2 23 03/08/2014 0500   GLUCOSE 194* 03/08/2014 0500   BUN 13 03/08/2014 0500   CREATININE 0.69 03/08/2014 0500   CALCIUM 9.6 03/08/2014 0500   PROT 7.1 03/08/2014 0500   ALBUMIN 3.1* 03/08/2014 0500   AST 12 03/08/2014 0500   ALT 8 03/08/2014 0500   ALKPHOS 90 03/08/2014 0500   BILITOT 0.6 03/08/2014 0500   GFRNONAA >90 03/08/2014 0500   GFRAA >90 03/08/2014 0500    No results found for this basename: LIPASE, AMYLASE,  in the last 168 hours No results found for this basename: AMMONIA,  in the last 168 hours  No results found for this basename: CKTOTAL, CKMB, CKMBINDEX, TROPONINI,  in the last 168 hours BNP (last 3 results) No results found for this basename: PROBNP,  in the last 8760 hours  Radiological Exams on  Admission: No results found.  Assessment/Plan Principal Problem:   Abscess of abdominal wall Active Problems:   Cellulitis   Diabetes mellitus   Essential hypertension, benign   GERD (gastroesophageal reflux disease)   1. Abscess of abdominal wall Failed Outpatient management of cellulitis  The patient is presenting with complaint of abdominal wall infection. She has undergone I&D in the ED and the specimen has been sent for culture. She was on doses as an outpatient. Currently I will add  ciprofloxacin for pseudomonal coverage due to her diabetes. I would continue her on doxycycline says it also provides coverage for MRSA Follow cultures   2. Diabetes mellitus. Continue home medication and place her on insulin sliding scale as well.  3. hypertension. Continue home medication.  4. GERD. Continue Protonix.  DVT Prophylaxis: subcutaneous Heparin Nutrition:  diabetes diet  Code Status:  full   Disposition: Admitted to inpatient in med-surge unit.  Author: Berle Mull, MD [facility] Pager: [phone] 03/08/2014, 6:10 AM    If 7PM-7AM, please contact night-coverage [email or web address] Password [email or web address]  **Disclaimer: This note may have been dictated with voice recognition software. Similar sounding words can inadvertently be transcribed and this note may contain transcription errors which may not have been corrected upon publication of note.**

## 2014-03-09 DIAGNOSIS — E139 Other specified diabetes mellitus without complications: Secondary | ICD-10-CM

## 2014-03-09 LAB — BASIC METABOLIC PANEL
Anion gap: 10 (ref 5–15)
BUN: 9 mg/dL (ref 6–23)
CALCIUM: 9.1 mg/dL (ref 8.4–10.5)
CO2: 26 meq/L (ref 19–32)
Chloride: 104 mEq/L (ref 96–112)
Creatinine, Ser: 0.75 mg/dL (ref 0.50–1.10)
GFR calc Af Amer: >90 mL/min (ref >90)
GLUCOSE: 164 mg/dL — AB (ref 70–99)
Potassium: 4.2 mEq/L (ref 3.7–5.3)
Sodium: 140 mEq/L (ref 137–147)

## 2014-03-09 LAB — GLUCOSE, CAPILLARY
GLUCOSE-CAPILLARY: 79 mg/dL (ref 70–99)
Glucose-Capillary: 157 mg/dL — ABNORMAL HIGH (ref 70–99)
Glucose-Capillary: 123 mg/dL — ABNORMAL HIGH (ref 70–99)

## 2014-03-09 LAB — CBC WITH DIFFERENTIAL/PLATELET
BASOS PCT: 0 % (ref 0–1)
Basophils Absolute: 0 10*3/uL (ref 0.0–0.1)
EOS ABS: 0.3 10*3/uL (ref 0.0–0.7)
EOS PCT: 4 % (ref 0–5)
HEMATOCRIT: 28.5 % — AB (ref 36.0–46.0)
HEMOGLOBIN: 9.2 g/dL — AB (ref 12.0–15.0)
Lymphocytes Relative: 31 % (ref 12–46)
Lymphs Abs: 2.3 10*3/uL (ref 0.7–4.0)
MCH: 23.5 pg — AB (ref 26.0–34.0)
MCHC: 32.3 g/dL (ref 30.0–36.0)
MCV: 72.7 fL — ABNORMAL LOW (ref 78.0–100.0)
MONO ABS: 0.5 10*3/uL (ref 0.1–1.0)
Monocytes Relative: 7 % (ref 3–12)
Neutro Abs: 4.3 10*3/uL (ref 1.7–7.7)
Neutrophils Relative %: 58 % (ref 43–77)
Platelets: 283 10*3/uL (ref 150–400)
RBC: 3.92 MIL/uL (ref 3.87–5.11)
RDW: 14.3 % (ref 11.5–15.5)
WBC: 7.4 10*3/uL (ref 4.0–10.5)

## 2014-03-09 MED ORDER — DULOXETINE HCL 60 MG PO CPEP: 60.0000 mg | ORAL_CAPSULE | ORAL | Status: DC

## 2014-03-09 MED ORDER — ATORVASTATIN CALCIUM 20 MG PO TABS
20.0000 mg | ORAL_TABLET | Freq: Every day | ORAL | Status: DC
  Filled 2014-03-09 (×3): qty 1

## 2014-03-09 MED ORDER — LORATADINE 10 MG PO TABS
10.0000 mg | ORAL_TABLET | Freq: Every day | ORAL | Status: DC
  Administered 2014-03-09 – 2014-03-11 (×3): 10 mg via ORAL
  Filled 2014-03-09 (×3): qty 1

## 2014-03-09 MED ORDER — OMEGA-3-ACID ETHYL ESTERS 1 G PO CAPS
1.0000 g | ORAL_CAPSULE | Freq: Every day | ORAL | Status: DC
  Administered 2014-03-09 – 2014-03-11 (×3): 1 g via ORAL
  Filled 2014-03-09 (×3): qty 1

## 2014-03-09 MED ORDER — SODIUM CHLORIDE 0.9 % IV BOLUS (SEPSIS)
1000.0000 mL | Freq: Once | INTRAVENOUS | Status: AC
  Administered 2014-03-09: 1000 mL via INTRAVENOUS

## 2014-03-09 NOTE — Progress Notes (Signed)
Patient ID: Dominique Dillon, female   DOB: 12-23-1955, 58 y.o.   MRN: [id number] [facility] Progress Note:   1 Day Post-Op  Subjective: Mental status is clear;  She reports that packing was removed earlier this morning   Abscess site is feeling much better Objective: Vital signs in last 24 hours: Temp:  [97.6 F (36.4 C)-98.5 F (36.9 C)] 98 F (36.7 C) (08/01 0702) Pulse Rate:  [78-97] 78 (08/01 0702) Resp:  [12-20] 18 (08/01 0702) BP: (93-133)/(50-92) 112/71 mmHg (08/01 0702) SpO2:  [96 %-100 %] 98 % (08/01 0702) Weight:  [219 lb 2.2 oz (99.4 kg)] 219 lb 2.2 oz (99.4 kg) (08/01 0702)  Intake/Output from previous day: 07/31 0701 - 08/01 0700 In: 2390 [I.V.:1790; IV Piggyback:600] Out: -  Intake/Output this shift:    Physical Exam: Work of breathing is normal.  Dressing over drain site.    Lab Results:  Results for orders placed during the hospital encounter of 03/07/14 (from the past 48 hour(s))  CBG MONITORING, ED     Status: Abnormal   Collection Time    03/07/14 11:59 PM      Result Value Ref Range   Glucose-Capillary 168 (*) 70 - 99 mg/dL  CBC WITH DIFFERENTIAL     Status: Abnormal   Collection Time    03/08/14 12:47 AM      Result Value Ref Range   WBC 9.8  4.0 - 10.5 K/uL   RBC 4.76  3.87 - 5.11 MIL/uL   Hemoglobin 11.1 (*) 12.0 - 15.0 g/dL   HCT 33.5 (*) 36.0 - 46.0 %   MCV 70.4 (*) 78.0 - 100.0 fL   MCH 23.3 (*) 26.0 - 34.0 pg   MCHC 33.1  30.0 - 36.0 g/dL   RDW 13.9  11.5 - 15.5 %   Platelets 307  150 - 400 K/uL   Neutrophils Relative % 72  43 - 77 %   Lymphocytes Relative 19  12 - 46 %   Monocytes Relative 7  3 - 12 %   Eosinophils Relative 2  0 - 5 %   Basophils Relative 0  0 - 1 %   Neutro Abs 7.0  1.7 - 7.7 K/uL   Lymphs Abs 1.9  0.7 - 4.0 K/uL   Monocytes Absolute 0.7  0.1 - 1.0 K/uL   Eosinophils Absolute 0.2  0.0 - 0.7 K/uL   Basophils Absolute 0.0  0.0 - 0.1 K/uL   Smear Review MORPHOLOGY UNREMARKABLE    I-STAT CHEM 8, ED     Status:  Abnormal   Collection Time    03/08/14 12:57 AM      Result Value Ref Range   Sodium 136 (*) 137 - 147 mEq/L   Potassium 3.5 (*) 3.7 - 5.3 mEq/L   Chloride 99  96 - 112 mEq/L   BUN 14  6 - 23 mg/dL   Creatinine, Ser 0.80  0.50 - 1.10 mg/dL   Glucose, Bld 202 (*) 70 - 99 mg/dL   Calcium, Ion 1.25 (*) 1.12 - 1.23 mmol/L   TCO2 24  0 - 100 mmol/L   Hemoglobin 12.6  12.0 - 15.0 g/dL   HCT 37.0  36.0 - 46.0 %  CULTURE, ROUTINE-ABSCESS     Status: None   Collection Time    03/08/14  1:58 AM      Result Value Ref Range   Specimen Description ABDOMEN     Special Requests NONE     Gram Stain  Value: ABUNDANT WBC PRESENT,BOTH PMN AND MONONUCLEAR     NO SQUAMOUS EPITHELIAL CELLS SEEN     FEW GRAM POSITIVE COCCI     IN PAIRS FEW GRAM VARIABLE ROD     Performed at [facility]   Culture PENDING     Report Status PENDING    COMPREHENSIVE METABOLIC PANEL     Status: Abnormal   Collection Time    03/08/14  5:00 AM      Result Value Ref Range   Sodium 137  137 - 147 mEq/L   Potassium 3.9  3.7 - 5.3 mEq/L   Chloride 99  96 - 112 mEq/L   CO2 23  19 - 32 mEq/L   Glucose, Bld 194 (*) 70 - 99 mg/dL   BUN 13  6 - 23 mg/dL   Creatinine, Ser 0.69  0.50 - 1.10 mg/dL   Calcium 9.6  8.4 - 10.5 mg/dL   Total Protein 7.1  6.0 - 8.3 g/dL   Albumin 3.1 (*) 3.5 - 5.2 g/dL   AST 12  0 - 37 U/L   ALT 8  0 - 35 U/L   Alkaline Phosphatase 90  39 - 117 U/L   Total Bilirubin 0.6  0.3 - 1.2 mg/dL   GFR calc non Af Amer >90  >90 mL/min   GFR calc Af Amer >90  >90 mL/min   Comment: (NOTE)     The eGFR has been calculated using the CKD EPI equation.     This calculation has not been validated in all clinical situations.     eGFR's persistently <90 mL/min signify possible Chronic Kidney     Disease.   Anion gap 15  5 - 15  CBC     Status: Abnormal   Collection Time    03/08/14  5:00 AM      Result Value Ref Range   WBC 10.0  4.0 - 10.5 K/uL   RBC 4.39  3.87 - 5.11 MIL/uL   Hemoglobin  10.3 (*) 12.0 - 15.0 g/dL   Comment: RESULT REPEATED AND VERIFIED     DELTA CHECK NOTED   HCT 31.4 (*) 36.0 - 46.0 %   MCV 71.5 (*) 78.0 - 100.0 fL   MCH 23.2 (*) 26.0 - 34.0 pg   MCHC 32.5  30.0 - 36.0 g/dL   RDW 13.9  11.5 - 15.5 %   Platelets 297  150 - 400 K/uL  PROTIME-INR     Status: None   Collection Time    03/08/14  5:00 AM      Result Value Ref Range   Prothrombin Time 13.6  11.6 - 15.2 seconds   INR 1.04  0.00 - 1.49  GLUCOSE, CAPILLARY     Status: Abnormal   Collection Time    03/08/14  7:42 AM      Result Value Ref Range   Glucose-Capillary 236 (*) 70 - 99 mg/dL   Comment 1 Notify RN    VITAMIN B12     Status: Abnormal   Collection Time    03/08/14  8:25 AM      Result Value Ref Range   Vitamin B-12 1878 (*) 211 - 911 pg/mL   Comment: Performed at [place]     Status: None   Collection Time    03/08/14  8:25 AM      Result Value Ref Range   Folate >20.0     Comment: (NOTE)  Reference Ranges            Deficient:       0.4 - 3.3 ng/mL            Indeterminate:   3.4 - 5.4 ng/mL            Normal:              > 5.4 ng/mL     Performed at [place] [facility]     Status: Abnormal   Collection Time    03/08/14  8:25 AM      Result Value Ref Range   Iron 22 (*) 42 - 135 ug/dL   TIBC 232 (*) 250 - 470 ug/dL   Saturation Ratios 9 (*) 20 - 55 %   UIBC 210  125 - 400 ug/dL   Comment: Performed at [place]     Status: None   Collection Time    03/08/14  8:25 AM      Result Value Ref Range   Ferritin 142  10 - 291 ng/mL   Comment: Performed at [place], CAPILLARY     Status: Abnormal   Collection Time    03/08/14 11:47 AM      Result Value Ref Range   Glucose-Capillary 202 (*) 70 - 99 mg/dL  MRSA PCR SCREENING     Status: None   Collection Time    03/08/14  2:02 PM      Result Value Ref Range   MRSA by PCR NEGATIVE  NEGATIVE   Comment:            The GeneXpert MRSA Assay  (FDA     approved for NASAL specimens     only), is one component of a     comprehensive MRSA colonization     surveillance program. It is not     intended to diagnose MRSA     infection nor to guide or     monitor treatment for     MRSA infections.  GLUCOSE, CAPILLARY     Status: None   Collection Time    03/08/14  4:16 PM      Result Value Ref Range   Glucose-Capillary 76  70 - 99 mg/dL   Comment 1 Notify RN     Comment 2 Documented in Chart    GLUCOSE, CAPILLARY     Status: Abnormal   Collection Time    03/08/14  9:32 PM      Result Value Ref Range   Glucose-Capillary 260 (*) 70 - 99 mg/dL  CBC WITH DIFFERENTIAL     Status: Abnormal   Collection Time    03/09/14  5:44 AM      Result Value Ref Range   WBC 7.4  4.0 - 10.5 K/uL   RBC 3.92  3.87 - 5.11 MIL/uL   Hemoglobin 9.2 (*) 12.0 - 15.0 g/dL   HCT 28.5 (*) 36.0 - 46.0 %   MCV 72.7 (*) 78.0 - 100.0 fL   MCH 23.5 (*) 26.0 - 34.0 pg   MCHC 32.3  30.0 - 36.0 g/dL   RDW 14.3  11.5 - 15.5 %   Platelets 283  150 - 400 K/uL   Neutrophils Relative % 58  43 - 77 %   Lymphocytes Relative 31  12 - 46 %   Monocytes Relative 7  3 - 12 %   Eosinophils Relative 4  0 -  5 %   Basophils Relative 0  0 - 1 %   Neutro Abs 4.3  1.7 - 7.7 K/uL   Lymphs Abs 2.3  0.7 - 4.0 K/uL   Monocytes Absolute 0.5  0.1 - 1.0 K/uL   Eosinophils Absolute 0.3  0.0 - 0.7 K/uL   Basophils Absolute 0.0  0.0 - 0.1 K/uL   Smear Review LARGE PLATELETS PRESENT    BASIC METABOLIC PANEL     Status: Abnormal   Collection Time    03/09/14  5:44 AM      Result Value Ref Range   Sodium 140  137 - 147 mEq/L   Potassium 4.2  3.7 - 5.3 mEq/L   Chloride 104  96 - 112 mEq/L   CO2 26  19 - 32 mEq/L   Glucose, Bld 164 (*) 70 - 99 mg/dL   BUN 9  6 - 23 mg/dL   Creatinine, Ser 0.75  0.50 - 1.10 mg/dL   Calcium 9.1  8.4 - 10.5 mg/dL   GFR calc non Af Amer >90  >90 mL/min   GFR calc Af Amer >90  >90 mL/min   Comment: (NOTE)     The eGFR has been calculated using the  CKD EPI equation.     This calculation has not been validated in all clinical situations.     eGFR's persistently <90 mL/min signify possible Chronic Kidney     Disease.   Anion gap 10  5 - 15  GLUCOSE, CAPILLARY     Status: Abnormal   Collection Time    03/09/14  7:33 AM      Result Value Ref Range   Glucose-Capillary 157 (*) 70 - 99 mg/dL    Radiology/Results: No results found.  Anti-infectives: Anti-infectives   Start     Dose/Rate Route Frequency Ordered Stop   03/08/14 1600  piperacillin-tazobactam (ZOSYN) IVPB 3.375 g     3.375 g 12.5 mL/hr over 240 Minutes Intravenous Every 8 hours 03/08/14 1421     03/08/14 1430  vancomycin (VANCOCIN) 1,250 mg in sodium chloride 0.9 % 250 mL IVPB     1,250 mg 166.7 mL/hr over 90 Minutes Intravenous Every 12 hours 03/08/14 1421     03/08/14 0530  ciprofloxacin (CIPRO) IVPB 400 mg  Status:  Discontinued     400 mg 200 mL/hr over 60 Minutes Intravenous Every 12 hours 03/08/14 0312 03/08/14 1409   03/08/14 0400  doxycycline (VIBRAMYCIN) 100 mg in dextrose 5 % 250 mL IVPB  Status:  Discontinued     100 mg 125 mL/hr over 120 Minutes Intravenous Every 12 hours 03/08/14 0248 03/08/14 1409   03/08/14 0030  clindamycin (CLEOCIN) IVPB 600 mg     600 mg 100 mL/hr over 30 Minutes Intravenous  Once 03/08/14 0025 03/08/14 0110      Assessment/Plan: Problem List: Patient Active Problem List   Diagnosis Date Noted  . Abscess of abdominal wall s/p I&D 7/30- & 03/08/2014 03/08/2014  . Cellulitis 03/08/2014  . Diabetes mellitus 03/08/2014  . Essential hypertension, benign 03/08/2014  . GERD (gastroesophageal reflux disease) 03/08/2014  . Anemia 03/08/2014    Improved.  Dressing changes and IV antibiotics 1 Day Post-Op    LOS: 2 days   Matt B. Hassell Done, MD, [facility] [phone] beeper [phone]  03/09/2014 10:25 AM

## 2014-03-09 NOTE — Progress Notes (Signed)
[facility] PROGRESS NOTE  Dominique Dillon JYN:[id number] DOB: [birth year] DOA: 03/07/2014 PCP: Danny Lawless  Assessment/Plan: #1 abdominal wall abscess and cellulitis Patient is status post irrigation and debridement in the operating room per general surgery on 03/08/2014. Clinical improvement. Wound cultures are pending. Blood cultures are pending. Continue empiric IV vancomycin and IV Zosyn. General surgery following and appreciate input and recommendations.  #2 diabetes mellitus Will need to check a hemoglobin A1c. CBGs are range from 164-202. Continue 70/30, sliding scale insulin.  #3 hypertension Continue Cozaar.  #4 gastroesophageal reflux disease PPI.  #5 prophylaxis Heparin for DVT prophylaxis.  Code Status: Full Family Communication: Updated patient at bedside. No family present. Disposition Plan: Home when medically stable.   Consultants:   General surgery: Dr. Johney Maine 03/08/2014 Procedures:  Status post irrigation and a bright man of abdominal wall abscess by Dr. Johney Maine 03/08/2014  Antibiotics:  IV doxycycline 03/08/2014>>>> 03/08/2014  IV ciprofloxacin 03/08/2014>>>>> 03/08/2014  IV vancomycin 03/08/2014  IV Zosyn 03/08/2014  HPI/Subjective: Patient states she's feeling better than on admission.  Objective: Filed Vitals:   03/09/14 0702  BP: 112/71  Pulse: 78  Temp: 98 F (36.7 C)  Resp: 18    Intake/Output Summary (Last 24 hours) at 03/09/14 1120 Last data filed at 03/09/14 0600  Gross per 24 hour  Intake   2390 ml  Output      0 ml  Net   2390 ml   Filed Weights   03/08/14 0227 03/09/14 0548 03/09/14 0702  Weight: 94.847 kg (209 lb 1.6 oz) 99.4 kg (219 lb 2.2 oz) 99.4 kg (219 lb 2.2 oz)    Exam:   General:  NAD  Cardiovascular: RRR  Respiratory: CTAB  Abdomen: Soft, nondistended, positive bowel sounds, no rebound, no guarding. Abdominal insight with dressing with some drainage.  Musculoskeletal: No c/c/e  Data  Reviewed: Basic Metabolic Panel:  Recent Labs Lab 03/08/14 0057 03/08/14 0500 03/09/14 0544  NA 136* 137 140  K 3.5* 3.9 4.2  CL 99 99 104  CO2  --  23 26  GLUCOSE 202* 194* 164*  BUN 14 13 9   CREATININE 0.80 0.69 0.75  CALCIUM  --  9.6 9.1   Liver Function Tests:  Recent Labs Lab 03/08/14 0500  AST 12  ALT 8  ALKPHOS 90  BILITOT 0.6  PROT 7.1  ALBUMIN 3.1*   No results found for this basename: LIPASE, AMYLASE,  in the last 168 hours No results found for this basename: AMMONIA,  in the last 168 hours CBC:  Recent Labs Lab 03/08/14 0047 03/08/14 0057 03/08/14 0500 03/09/14 0544  WBC 9.8  --  10.0 7.4  NEUTROABS 7.0  --   --  4.3  HGB 11.1* 12.6 10.3* 9.2*  HCT 33.5* 37.0 31.4* 28.5*  MCV 70.4*  --  71.5* 72.7*  PLT 307  --  297 283   Cardiac Enzymes: No results found for this basename: CKTOTAL, CKMB, CKMBINDEX, TROPONINI,  in the last 168 hours BNP (last 3 results) No results found for this basename: PROBNP,  in the last 8760 hours CBG:  Recent Labs Lab 03/08/14 0742 03/08/14 1147 03/08/14 1616 03/08/14 2132 03/09/14 0733  GLUCAP 236* 202* 76 260* 157*    Recent Results (from the past 240 hour(s))  CULTURE, ROUTINE-ABSCESS     Status: None   Collection Time    03/08/14  1:58 AM      Result Value Ref Range Status   Specimen Description ABDOMEN   Final  Special Requests NONE   Final   Gram Stain     Final   Value: ABUNDANT WBC PRESENT,BOTH PMN AND MONONUCLEAR     NO SQUAMOUS EPITHELIAL CELLS SEEN     FEW GRAM POSITIVE COCCI     IN PAIRS FEW GRAM VARIABLE ROD     Performed at [facility]   Culture PENDING   Incomplete   Report Status PENDING   Incomplete  MRSA PCR SCREENING     Status: None   Collection Time    03/08/14  2:02 PM      Result Value Ref Range Status   MRSA by PCR NEGATIVE  NEGATIVE Final   Comment:            The GeneXpert MRSA Assay (FDA     approved for NASAL specimens     only), is one component of a      comprehensive MRSA colonization     surveillance program. It is not     intended to diagnose MRSA     infection nor to guide or     monitor treatment for     MRSA infections.     Studies: No results found.  Scheduled Meds: . aspirin  81 mg Oral Daily  . enoxaparin (LOVENOX) injection  40 mg Subcutaneous Q24H  . ferrous sulfate  325 mg Oral Q breakfast  . insulin aspart  0-15 Units Subcutaneous TID WC  . insulin aspart  0-5 Units Subcutaneous QHS  . insulin aspart protamine- aspart  50 Units Subcutaneous BID  . lip balm  1 application Topical BID  . losartan  50 mg Oral Daily  . pantoprazole  40 mg Oral Daily  . piperacillin-tazobactam (ZOSYN)  IV  3.375 g Intravenous Q8H  . sodium chloride  3 mL Intravenous Q12H  . vancomycin  1,250 mg Intravenous Q12H   Continuous Infusions: . sodium chloride 0.9 % 1,000 mL infusion 100 mL/hr at 03/08/14 2130    Principal Problem:   Abscess of abdominal wall s/p I&D 7/30- & 03/08/2014 Active Problems:   Cellulitis   Diabetes mellitus   Essential hypertension, benign   GERD (gastroesophageal reflux disease)   Anemia    Time spent: [place] MD [facility] Pager [phone]. If 7PM-7AM, please contact night-coverage at [email or web address], password [facility] 03/09/2014, 11:20 AM  LOS: 2 days

## 2014-03-10 LAB — CBC
HCT: 26.5 % — ABNORMAL LOW (ref 36.0–46.0)
Hemoglobin: 8.7 g/dL — ABNORMAL LOW (ref 12.0–15.0)
MCH: 23.5 pg — ABNORMAL LOW (ref 26.0–34.0)
MCHC: 32.8 g/dL (ref 30.0–36.0)
MCV: 71.6 fL — AB (ref 78.0–100.0)
Platelets: 283 10*3/uL (ref 150–400)
RBC: 3.7 MIL/uL — AB (ref 3.87–5.11)
RDW: 14.2 % (ref 11.5–15.5)
WBC: 6 10*3/uL (ref 4.0–10.5)

## 2014-03-10 LAB — BASIC METABOLIC PANEL
ANION GAP: 11 (ref 5–15)
BUN: 7 mg/dL (ref 6–23)
CO2: 24 mEq/L (ref 19–32)
Calcium: 9 mg/dL (ref 8.4–10.5)
Chloride: 107 mEq/L (ref 96–112)
Creatinine, Ser: 0.68 mg/dL (ref 0.50–1.10)
GFR calc Af Amer: >90 mL/min (ref >90)
GLUCOSE: 176 mg/dL — AB (ref 70–99)
POTASSIUM: 4.3 meq/L (ref 3.7–5.3)
Sodium: 142 mEq/L (ref 137–147)

## 2014-03-10 LAB — GLUCOSE, CAPILLARY
GLUCOSE-CAPILLARY: 185 mg/dL — AB (ref 70–99)
GLUCOSE-CAPILLARY: 105 mg/dL — AB (ref 70–99)
Glucose-Capillary: 153 mg/dL — ABNORMAL HIGH (ref 70–99)
Glucose-Capillary: 183 mg/dL — ABNORMAL HIGH (ref 70–99)
Glucose-Capillary: 111 mg/dL — ABNORMAL HIGH (ref 70–99)

## 2014-03-10 NOTE — Progress Notes (Signed)
[place]  [address] [place], [place] [address] Phone: [phone] FAX: [phone]    Ilena Dieckman [id number] [birth year]  CARE TEAM:  PCP: Reinaldo Meeker [facility]  Outpatient Care Team: Patient Care Team: Reinaldo Meeker, [facility] as [facility]CP - General (Pharmacist)  Inpatient Treatment Team: Treatment Team: Attending Provider: Eugenie Filler, MD; Technician: Jenell Milliner, NT; Rounding Team: Ian Bushman, MD; Registered Nurse: Kathryne Hitch Block, RN; Consulting Physician: Nolon Nations, MD; Technician: Candy Sledge, [place]; Registered Nurse: Roxine Caddy, RN; Registered Nurse: Damita Dunnings, RN; Registered Nurse: Aldean Ast, RN; Registered Nurse: Anthoney Harada, RN   Subjective:  Showering this AM - just finished Feeling better Trying to get glc until better control  Objective:  Vital signs:  Filed Vitals:   03/10/14 0104 03/10/14 0311 03/10/14 0517 03/10/14 0708  BP: 130/79 125/76 137/78   Pulse:  70 72   Temp:   98.1 F (36.7 C)   TempSrc:   Oral   Resp:   18   Height:      Weight:    218 lb 0.6 oz (98.9 kg)  SpO2:   99%     Last BM Date: 03/09/14  Intake/Output   Yesterday:  08/01 0701 - 08/02 0700 In: 800 [I.V.:800] Out: -  This shift:     Bowel function:  Flatus: y  BM: y  Drain: n/a  Physical Exam:  General: Pt awake/alert/oriented x4 in no acute distress Eyes: PERRL, normal EOM.  Sclera clear.  No icterus Neuro: CN II-XII intact w/o focal sensory/motor deficits. Lymph: No head/neck/groin lymphadenopathy Psych:  No delerium/psychosis/paranoia HENT: Normocephalic, Mucus membranes moist.  No thrush Neck: Supple, No tracheal deviation Chest: No chest wall pain w good excursion CV:  Pulses intact.  Regular rhythm MS: Normal AROM mjr joints.  No obvious deformity Abdomen: Soft.  Nondistended.  Mildly tender at incisions only.  No evidence of peritonitis.  No incarcerated  hernias.  Cellulitis minimal.  Wound clean - more superficial  Ext:  SCDs BLE.  No mjr edema.  No cyanosis Skin: No petechiae / purpura   Problem List:   Principal Problem:   Abscess of abdominal wall s/p I&D 7/30- & 03/08/2014 Active Problems:   Cellulitis   Diabetes mellitus   Essential hypertension, benign   GERD (gastroesophageal reflux disease)   Anemia   Assessment  Dominique Dillon  58 y.o. female  2 Days Post-Op   POST-OPERATIVE DIAGNOSIS: abdominal wall abscess  PROCEDURE: Procedure(s):  IRRIGATION AND DEBRIDEMENT ABDOMINAL WALL ABSCESS  SURGEON: Adin Hector, MD     Improving  Plan:  -wound care -complete ABx x5 days post op (can switch to PO at d/c - doxycycline id MRSA (unlikely w meg MRSA screen) vs Keflex -OK to d/c with daily dressing changes at home (?HH vs family) -RTC [facility] office ~ 2weeks -VTE prophylaxis- SCDs, etc -mobilize as tolerated to help recovery  Will f/u PRN in hospital - call w questions  Adin Hector, M.D., F.A.C.S. Gastrointestinal and Minimally Invasive Surgery [facility] [address] [place], [place] [address] [phone] Main / Paging   03/10/2014   Results:   Labs: Results for orders placed during the hospital encounter of 03/07/14 (from the past 48 hour(s))  GLUCOSE, CAPILLARY     Status: Abnormal   Collection Time    03/08/14 11:47 AM      Result Value Ref Range   Glucose-Capillary 202 (*)  70 - 99 mg/dL  MRSA PCR SCREENING     Status: None   Collection Time    03/08/14  2:02 PM      Result Value Ref Range   MRSA by PCR NEGATIVE  NEGATIVE   Comment:            The GeneXpert MRSA Assay (FDA     approved for NASAL specimens     only), is one component of a     comprehensive MRSA colonization     surveillance program. It is not     intended to diagnose MRSA     infection nor to guide or     monitor treatment for     MRSA infections.  ANAEROBIC CULTURE     Status: None    Collection Time    03/08/14  2:15 PM      Result Value Ref Range   Specimen Description ABDOMEN ABSCESS     Special Requests PATIENT ON FOLLOWING ZOYSN     Gram Stain       Value: RARE WBC PRESENT, PREDOMINANTLY MONONUCLEAR     NO SQUAMOUS EPITHELIAL CELLS SEEN     RARE GRAM POSITIVE COCCI IN PAIRS     Performed at [facility]   Culture       Value: NO ANAEROBES ISOLATED; CULTURE IN PROGRESS FOR 5 DAYS     Performed at [facility]   Report Status PENDING    CULTURE, ROUTINE-ABSCESS     Status: None   Collection Time    03/08/14  2:15 PM      Result Value Ref Range   Specimen Description ABDOMEN ABSCESS     Special Requests PATIENT ON FOLLOWING ZOYSN     Gram Stain       Value: RARE WBC PRESENT, PREDOMINANTLY MONONUCLEAR     NO SQUAMOUS EPITHELIAL CELLS SEEN     RARE GRAM POSITIVE COCCI IN PAIRS     Performed at [facility]   Culture       Value: MODERATE MICROAEROPHILIC STREPTOCOCCI     Note: Standardized susceptibility testing for this organism is not available.     Performed at [facility]   Report Status PENDING    GLUCOSE, CAPILLARY     Status: None   Collection Time    03/08/14  4:16 PM      Result Value Ref Range   Glucose-Capillary 76  70 - 99 mg/dL   Comment 1 Notify RN     Comment 2 Documented in Chart    GLUCOSE, CAPILLARY     Status: Abnormal   Collection Time    03/08/14  9:32 PM      Result Value Ref Range   Glucose-Capillary 260 (*) 70 - 99 mg/dL  CBC WITH DIFFERENTIAL     Status: Abnormal   Collection Time    03/09/14  5:44 AM      Result Value Ref Range   WBC 7.4  4.0 - 10.5 K/uL   RBC 3.92  3.87 - 5.11 MIL/uL   Hemoglobin 9.2 (*) 12.0 - 15.0 g/dL   HCT 28.5 (*) 36.0 - 46.0 %   MCV 72.7 (*) 78.0 - 100.0 fL   MCH 23.5 (*) 26.0 - 34.0 pg   MCHC 32.3  30.0 - 36.0 g/dL   RDW 14.3  11.5 - 15.5 %   Platelets 283  150 - 400 K/uL   Neutrophils Relative % 58  43 - 77 %  Lymphocytes Relative 31  12 - 46 %    Monocytes Relative 7  3 - 12 %   Eosinophils Relative 4  0 - 5 %   Basophils Relative 0  0 - 1 %   Neutro Abs 4.3  1.7 - 7.7 K/uL   Lymphs Abs 2.3  0.7 - 4.0 K/uL   Monocytes Absolute 0.5  0.1 - 1.0 K/uL   Eosinophils Absolute 0.3  0.0 - 0.7 K/uL   Basophils Absolute 0.0  0.0 - 0.1 K/uL   Smear Review LARGE PLATELETS PRESENT    BASIC METABOLIC PANEL     Status: Abnormal   Collection Time    03/09/14  5:44 AM      Result Value Ref Range   Sodium 140  137 - 147 mEq/L   Potassium 4.2  3.7 - 5.3 mEq/L   Chloride 104  96 - 112 mEq/L   CO2 26  19 - 32 mEq/L   Glucose, Bld 164 (*) 70 - 99 mg/dL   BUN 9  6 - 23 mg/dL   Creatinine, Ser 0.75  0.50 - 1.10 mg/dL   Calcium 9.1  8.4 - 10.5 mg/dL   GFR calc non Af Amer >90  >90 mL/min   GFR calc Af Amer >90  >90 mL/min   Comment: (NOTE)     The eGFR has been calculated using the CKD EPI equation.     This calculation has not been validated in all clinical situations.     eGFR's persistently <90 mL/min signify possible Chronic Kidney     Disease.   Anion gap 10  5 - 15  GLUCOSE, CAPILLARY     Status: Abnormal   Collection Time    03/09/14  7:33 AM      Result Value Ref Range   Glucose-Capillary 157 (*) 70 - 99 mg/dL  GLUCOSE, CAPILLARY     Status: Abnormal   Collection Time    03/09/14 11:28 AM      Result Value Ref Range   Glucose-Capillary 123 (*) 70 - 99 mg/dL  GLUCOSE, CAPILLARY     Status: None   Collection Time    03/09/14  4:46 PM      Result Value Ref Range   Glucose-Capillary 79  70 - 99 mg/dL  GLUCOSE, CAPILLARY     Status: Abnormal   Collection Time    03/09/14  9:32 PM      Result Value Ref Range   Glucose-Capillary 185 (*) 70 - 99 mg/dL  BASIC METABOLIC PANEL     Status: Abnormal   Collection Time    03/10/14  5:53 AM      Result Value Ref Range   Sodium 142  137 - 147 mEq/L   Potassium 4.3  3.7 - 5.3 mEq/L   Chloride 107  96 - 112 mEq/L   CO2 24  19 - 32 mEq/L   Glucose, Bld 176 (*) 70 - 99 mg/dL   BUN 7  6 -  23 mg/dL   Creatinine, Ser 0.68  0.50 - 1.10 mg/dL   Calcium 9.0  8.4 - 10.5 mg/dL   GFR calc non Af Amer >90  >90 mL/min   GFR calc Af Amer >90  >90 mL/min   Comment: (NOTE)     The eGFR has been calculated using the CKD EPI equation.     This calculation has not been validated in all clinical situations.     eGFR's persistently <90 mL/min signify possible  Chronic Kidney     Disease.   Anion gap 11  5 - 15  CBC     Status: Abnormal   Collection Time    03/10/14  5:53 AM      Result Value Ref Range   WBC 6.0  4.0 - 10.5 K/uL   RBC 3.70 (*) 3.87 - 5.11 MIL/uL   Hemoglobin 8.7 (*) 12.0 - 15.0 g/dL   Comment: REPEATED TO VERIFY   HCT 26.5 (*) 36.0 - 46.0 %   MCV 71.6 (*) 78.0 - 100.0 fL   MCH 23.5 (*) 26.0 - 34.0 pg   MCHC 32.8  30.0 - 36.0 g/dL   RDW 14.2  11.5 - 15.5 %   Platelets 283  150 - 400 K/uL  GLUCOSE, CAPILLARY     Status: Abnormal   Collection Time    03/10/14  7:07 AM      Result Value Ref Range   Glucose-Capillary 153 (*) 70 - 99 mg/dL   Comment 1 Notify RN      Imaging / Studies: No results found.  Medications / Allergies: per chart  Antibiotics: Anti-infectives   Start     Dose/Rate Route Frequency Ordered Stop   03/08/14 1600  piperacillin-tazobactam (ZOSYN) IVPB 3.375 g     3.375 g 12.5 mL/hr over 240 Minutes Intravenous Every 8 hours 03/08/14 1421     03/08/14 1430  vancomycin (VANCOCIN) 1,250 mg in sodium chloride 0.9 % 250 mL IVPB     1,250 mg 166.7 mL/hr over 90 Minutes Intravenous Every 12 hours 03/08/14 1421     03/08/14 0530  ciprofloxacin (CIPRO) IVPB 400 mg  Status:  Discontinued     400 mg 200 mL/hr over 60 Minutes Intravenous Every 12 hours 03/08/14 0312 03/08/14 1409   03/08/14 0400  doxycycline (VIBRAMYCIN) 100 mg in dextrose 5 % 250 mL IVPB  Status:  Discontinued     100 mg 125 mL/hr over 120 Minutes Intravenous Every 12 hours 03/08/14 0248 03/08/14 1409   03/08/14 0030  clindamycin (CLEOCIN) IVPB 600 mg     600 mg 100 mL/hr over 30  Minutes Intravenous  Once 03/08/14 0025 03/08/14 0110       Note: Portions of this report may have been transcribed using voice recognition software. Every effort was made to ensure accuracy; however, inadvertent computerized transcription errors may be present.   Any transcriptional errors that result from this process are unintentional.

## 2014-03-10 NOTE — Progress Notes (Signed)
[facility] PROGRESS NOTE  Dominique Dillon BTD:[id number] DOB: [birth year] DOA: 03/07/2014 PCP: Danny Lawless  Assessment/Plan: #1 abdominal wall abscess and cellulitis Patient is status post irrigation and debridement in the operating room per general surgery on 03/08/2014. Clinical improvement. Wound cultures are pending. Blood cultures are pending. Continue empiric IV vancomycin and IV Zosyn. General surgery following and appreciate input and recommendations.  #2 diabetes mellitus Will need to check a hemoglobin A1c. CBGs are range from 79-185. Continue 70/30, sliding scale insulin.  #3 hypertension BP meds on hold.  #4 gastroesophageal reflux disease PPI.  #5 prophylaxis Heparin for DVT prophylaxis.  Code Status: Full Family Communication: Updated patient at bedside. No family present. Disposition Plan: Home when medically stable in 1-2 days.   Consultants:   General surgery: Dr. Johney Maine 03/08/2014 Procedures:  Status post irrigation and debridement of abdominal wall abscess by Dr. Johney Maine 03/08/2014  Antibiotics:  IV doxycycline 03/08/2014>>>> 03/08/2014  IV ciprofloxacin 03/08/2014>>>>> 03/08/2014  IV vancomycin 03/08/2014  IV Zosyn 03/08/2014  HPI/Subjective: Patient states she's feeling better than on admission. No complaints.  Objective: Filed Vitals:   03/10/14 0517  BP: 137/78  Pulse: 72  Temp: 98.1 F (36.7 C)  Resp: 18    Intake/Output Summary (Last 24 hours) at 03/10/14 1320 Last data filed at 03/09/14 1430  Gross per 24 hour  Intake    800 ml  Output      0 ml  Net    800 ml   Filed Weights   03/09/14 0548 03/09/14 0702 03/10/14 0708  Weight: 99.4 kg (219 lb 2.2 oz) 99.4 kg (219 lb 2.2 oz) 98.9 kg (218 lb 0.6 oz)    Exam:   General:  NAD  Cardiovascular: RRR  Respiratory: CTAB  Abdomen: Soft, nondistended, positive bowel sounds, no rebound, no guarding. Abdominal wall with dressing with some drainage.  Musculoskeletal: No  c/c/e  Data Reviewed: Basic Metabolic Panel:  Recent Labs Lab 03/08/14 0057 03/08/14 0500 03/09/14 0544 03/10/14 0553  NA 136* 137 140 142  K 3.5* 3.9 4.2 4.3  CL 99 99 104 107  CO2  --  23 26 24   GLUCOSE 202* 194* 164* 176*  BUN 14 13 9 7   CREATININE 0.80 0.69 0.75 0.68  CALCIUM  --  9.6 9.1 9.0   Liver Function Tests:  Recent Labs Lab 03/08/14 0500  AST 12  ALT 8  ALKPHOS 90  BILITOT 0.6  PROT 7.1  ALBUMIN 3.1*   No results found for this basename: LIPASE, AMYLASE,  in the last 168 hours No results found for this basename: AMMONIA,  in the last 168 hours CBC:  Recent Labs Lab 03/08/14 0047 03/08/14 0057 03/08/14 0500 03/09/14 0544 03/10/14 0553  WBC 9.8  --  10.0 7.4 6.0  NEUTROABS 7.0  --   --  4.3  --   HGB 11.1* 12.6 10.3* 9.2* 8.7*  HCT 33.5* 37.0 31.4* 28.5* 26.5*  MCV 70.4*  --  71.5* 72.7* 71.6*  PLT 307  --  297 283 283   Cardiac Enzymes: No results found for this basename: CKTOTAL, CKMB, CKMBINDEX, TROPONINI,  in the last 168 hours BNP (last 3 results) No results found for this basename: PROBNP,  in the last 8760 hours CBG:  Recent Labs Lab 03/09/14 1128 03/09/14 1646 03/09/14 2132 03/10/14 0707 03/10/14 1129  GLUCAP 123* 79 185* 153* 105*    Recent Results (from the past 240 hour(s))  CULTURE, ROUTINE-ABSCESS     Status: None   Collection  Time    03/08/14  1:58 AM      Result Value Ref Range Status   Specimen Description ABDOMEN   Final   Special Requests NONE   Final   Gram Stain     Final   Value: ABUNDANT WBC PRESENT,BOTH PMN AND MONONUCLEAR     NO SQUAMOUS EPITHELIAL CELLS SEEN     FEW GRAM POSITIVE COCCI     IN PAIRS FEW GRAM VARIABLE ROD     Performed at [facility]   Culture     Final   Value: MULTIPLE ORGANISMS PRESENT, NONE PREDOMINANT     Performed at [facility]   Report Status PENDING   Incomplete  MRSA PCR SCREENING     Status: None   Collection Time    03/08/14  2:02 PM      Result Value  Ref Range Status   MRSA by PCR NEGATIVE  NEGATIVE Final   Comment:            The GeneXpert MRSA Assay (FDA     approved for NASAL specimens     only), is one component of a     comprehensive MRSA colonization     surveillance program. It is not     intended to diagnose MRSA     infection nor to guide or     monitor treatment for     MRSA infections.  ANAEROBIC CULTURE     Status: None   Collection Time    03/08/14  2:15 PM      Result Value Ref Range Status   Specimen Description ABDOMEN ABSCESS   Final   Special Requests PATIENT ON FOLLOWING ZOYSN   Final   Gram Stain     Final   Value: RARE WBC PRESENT, PREDOMINANTLY MONONUCLEAR     NO SQUAMOUS EPITHELIAL CELLS SEEN     RARE GRAM POSITIVE COCCI IN PAIRS     Performed at [facility]   Culture     Final   Value: NO ANAEROBES ISOLATED; CULTURE IN PROGRESS FOR 5 DAYS     Performed at [facility]   Report Status PENDING   Incomplete  CULTURE, ROUTINE-ABSCESS     Status: None   Collection Time    03/08/14  2:15 PM      Result Value Ref Range Status   Specimen Description ABDOMEN ABSCESS   Final   Special Requests PATIENT ON FOLLOWING ZOYSN   Final   Gram Stain     Final   Value: RARE WBC PRESENT, PREDOMINANTLY MONONUCLEAR     NO SQUAMOUS EPITHELIAL CELLS SEEN     RARE GRAM POSITIVE COCCI IN PAIRS     Performed at [facility]   Culture     Final   Value: MODERATE MICROAEROPHILIC STREPTOCOCCI     Note: Standardized susceptibility testing for this organism is not available.     Performed at [facility]   Report Status PENDING   Incomplete     Studies: No results found.  Scheduled Meds: . aspirin  81 mg Oral Daily  . atorvastatin  20 mg Oral q1800  . enoxaparin (LOVENOX) injection  40 mg Subcutaneous Q24H  . ferrous sulfate  325 mg Oral Q breakfast  . insulin aspart  0-15 Units Subcutaneous TID WC  . insulin aspart  0-5 Units Subcutaneous QHS  . insulin aspart protamine- aspart  50  Units Subcutaneous BID  . lip balm  1  application Topical BID  . loratadine  10 mg Oral Daily  . omega-3 acid ethyl esters  1 g Oral Daily  . pantoprazole  40 mg Oral Daily  . piperacillin-tazobactam (ZOSYN)  IV  3.375 g Intravenous Q8H  . sodium chloride  3 mL Intravenous Q12H  . vancomycin  1,250 mg Intravenous Q12H   Continuous Infusions: . sodium chloride 0.9 % 1,000 mL infusion 75 mL/hr at 03/10/14 0300    Principal Problem:   Abscess of abdominal wall s/p I&D 7/30- & 03/08/2014 Active Problems:   Cellulitis   Diabetes mellitus   Essential hypertension, benign   GERD (gastroesophageal reflux disease)   Anemia    Time spent: [place] MD [facility] Pager [phone]. If 7PM-7AM, please contact night-coverage at [email or web address], password [facility] 03/10/2014, 1:20 PM  LOS: 3 days

## 2014-03-11 ENCOUNTER — Encounter (HOSPITAL_COMMUNITY): Payer: Self-pay | Admitting: Surgery

## 2014-03-11 LAB — GLUCOSE, CAPILLARY
GLUCOSE-CAPILLARY: 98 mg/dL (ref 70–99)
GLUCOSE-CAPILLARY: 75 mg/dL (ref 70–99)
GLUCOSE-CAPILLARY: 129 mg/dL — AB (ref 70–99)

## 2014-03-11 LAB — CBC
HEMATOCRIT: 26.9 % — AB (ref 36.0–46.0)
HEMOGLOBIN: 8.5 g/dL — AB (ref 12.0–15.0)
MCH: 22.8 pg — AB (ref 26.0–34.0)
MCHC: 31.6 g/dL (ref 30.0–36.0)
MCV: 72.1 fL — AB (ref 78.0–100.0)
Platelets: 300 10*3/uL (ref 150–400)
RBC: 3.73 MIL/uL — ABNORMAL LOW (ref 3.87–5.11)
RDW: 14.2 % (ref 11.5–15.5)
WBC: 6.9 10*3/uL (ref 4.0–10.5)

## 2014-03-11 LAB — BASIC METABOLIC PANEL
Anion gap: 11 (ref 5–15)
BUN: 8 mg/dL (ref 6–23)
CHLORIDE: 105 meq/L (ref 96–112)
CO2: 26 meq/L (ref 19–32)
Calcium: 9.1 mg/dL (ref 8.4–10.5)
Creatinine, Ser: 0.72 mg/dL (ref 0.50–1.10)
GFR calc Af Amer: >90 mL/min (ref >90)
GFR calc non Af Amer: >90 mL/min (ref >90)
GLUCOSE: 77 mg/dL (ref 70–99)
POTASSIUM: 3.6 meq/L — AB (ref 3.7–5.3)
Sodium: 142 mEq/L (ref 137–147)

## 2014-03-11 LAB — HEMOGLOBIN A1C
Hgb A1c MFr Bld: 8.8 % — ABNORMAL HIGH (ref <5.7)
Mean Plasma Glucose: 206 mg/dL — ABNORMAL HIGH (ref <117)

## 2014-03-11 LAB — CULTURE, ROUTINE-ABSCESS

## 2014-03-11 MED ORDER — SULFAMETHOXAZOLE-TRIMETHOPRIM 400-80 MG PO TABS
1.0000 | ORAL_TABLET | Freq: Two times a day (BID) | ORAL | Status: DC
  Administered 2014-03-11: 1 via ORAL
  Filled 2014-03-11 (×2): qty 1

## 2014-03-11 MED ORDER — AMOXICILLIN-POT CLAVULANATE 875-125 MG PO TABS
1.0000 | ORAL_TABLET | Freq: Two times a day (BID) | ORAL | Status: DC
  Administered 2014-03-11: 1 via ORAL
  Filled 2014-03-11 (×2): qty 1

## 2014-03-11 MED ORDER — OXYCODONE HCL 5 MG PO TABS: 5.0000 mg | ORAL_TABLET | ORAL | Status: DC | PRN

## 2014-03-11 MED ORDER — POTASSIUM CHLORIDE CRYS ER 20 MEQ PO TBCR
40.0000 meq | EXTENDED_RELEASE_TABLET | Freq: Once | ORAL | Status: AC
  Administered 2014-03-11: 40 meq via ORAL
  Filled 2014-03-11: qty 2

## 2014-03-11 MED ORDER — AMOXICILLIN-POT CLAVULANATE 875-125 MG PO TABS: 1.0000 | ORAL_TABLET | Freq: Two times a day (BID) | ORAL | Status: AC

## 2014-03-11 MED ORDER — LOSARTAN POTASSIUM 50 MG PO TABS
50.0000 mg | ORAL_TABLET | Freq: Every day | ORAL | Status: DC
  Administered 2014-03-11: 50 mg via ORAL
  Filled 2014-03-11: qty 1

## 2014-03-11 NOTE — Progress Notes (Signed)
Patient given discharge instructions, and verbalized an understanding of all discharge instructions.  Patient agrees with discharge plan, and is being discharged in stable medical condition.  Patient given specific instructions about wound care, and a wound care nurse will be coming to help patient out.   Patient given transportation via wheelchair.  Durwin Nora RN

## 2014-03-11 NOTE — Discharge Summary (Signed)
Physician Discharge Summary  Owen Pagnotta HKV:[id number] DOB: 05-Feb-1956 DOA: 03/07/2014  PCP: Danny Lawless  Admit date: 03/07/2014 Discharge date: 03/11/2014  Time spent: 65 minutes  Recommendations for Outpatient Follow-up:  1. Followup with BRAY,BRYAN, RPH in 1 week. 2. Followup with general surgery in 2 weeks for followup on abdominal wall abscess.  Discharge Diagnoses:  Principal Problem:   Abscess of abdominal wall s/p I&D 7/30- & 03/08/2014 Active Problems:   Cellulitis   Diabetes mellitus   Essential hypertension, benign   GERD (gastroesophageal reflux disease)   Anemia   Discharge Condition: Stable and improved  Diet recommendation: Carb modified diet  Filed Weights   03/09/14 0702 03/10/14 0708 03/11/14 0507  Weight: 99.4 kg (219 lb 2.2 oz) 98.9 kg (218 lb 0.6 oz) 99.4 kg (219 lb 2.2 oz)    History of present illness:  Dominique Dillon is a 58 y.o. female with Past medical history of diabetes, hypertension, dyslipidemia, obesity.  Patient presented with complaints of stomach wall infection. She noted a red spot with some pain after she gave herself a regular insulin injection 5 days prior to admission.  Since it was progressively worsening she saw her PCP for the same and was given doxycycline which she had been taking for last 3 days prior to admission. On day of admission, the infection had spread all over her abdomen and she had rupture of the infected bulla with some pus coming out and therefore she came to the hospital. Denied any rash anywhere else. Denies any fever or chills. Denies any nausea or vomiting. Denies any abdominal pain diarrhea constipation.  Her sugar has been running in 250+ range since last few days, prior to admission. No similar infection in the past.    Hospital Course:  #1 abdominal wall abscess and cellulitis  Patient was admitted with abdominal wall abscess and cellulitis. Patient had initial I and D done in the emergency room and patient was  initially placed on IV doxycycline IV ciprofloxacin. Patient was admitted to [facility] floor and due to induration noted on examination general surgical consultation was obtained. Patient was seen by general surgery and subsequently taken to the operating room for irrigation and debridement per general surgery on 03/08/2014. Wound cultures were also obtained as well as blood cultures. Patient's IV antibiotics were subsequently changed to IV vancomycin IV Zosyn and patient followed. Patient improved clinically during the hospitalization. Wound cultures came back with moderate microaerophilic streptococci. IV vancomycin IV Zosyn was subsequently discontinued and patient started on oral Augmentin. Patient be discharged home on 12 more days of oral Augmentin to complete a two-week course postoperatively. Patient will followup with general surgery as outpatient as well as PCP. Patient will be discharged in stable and improved condition.  #2 diabetes mellitus  Patient was admitted and noted to have a history of diabetes mellitus. Hemoglobin A1c which was obtained came back at 8.8. Patient's CBGs were well controlled on insulin 70/30 and sliding scale insulin. Patient will need outpatient followup.  #3 hypertension  Patient's antihypertensive medications were initially held during the early part of the hospitalization secondary to borderline blood pressure. Patient was hydrated with IV fluids. Patient's Cozaar was resumed as her blood pressure responded to IV fluids and antibiotics. Patient be discharged home back on her home regimen of antihypertensive medications.  #4 gastroesophageal reflux disease  Patient was maintained on a proton pump inhibitor throughout the hospitalization.   Procedures: Status post irrigation and debridement of abdominal wall abscess by Dr. Johney Maine 03/08/2014  Consultations: General surgery: Dr. Johney Maine 03/08/2014   Discharge Exam: Filed Vitals:   03/11/14 1132  BP: 142/89  Pulse:  67  Temp:   Resp:     General: NAD Cardiovascular: RRR Respiratory: CTAB  Discharge Instructions You were cared for by a hospitalist during your hospital stay. If you have any questions about your discharge medications or the care you received while you were in the hospital after you are discharged, you can call the unit and asked to speak with the hospitalist on call if the hospitalist that took care of you is not available. Once you are discharged, your primary care physician will handle any further medical issues. Please note that NO REFILLS for any discharge medications will be authorized once you are discharged, as it is imperative that you return to your primary care physician (or establish a relationship with a primary care physician if you do not have one) for your aftercare needs so that they can reassess your need for medications and monitor your lab values.      Discharge Instructions   Call MD for:  extreme fatigue    Complete by:  As directed      Call MD for:  hives    Complete by:  As directed      Call MD for:  persistant nausea and vomiting    Complete by:  As directed      Call MD for:  redness, tenderness, or signs of infection (pain, swelling, redness, odor or green/yellow discharge around incision site)    Complete by:  As directed      Call MD for:  severe uncontrolled pain    Complete by:  As directed      Call MD for:    Complete by:  As directed   Temperature > 101.36F     Diet - low sodium heart healthy    Complete by:  As directed      Diet Carb Modified    Complete by:  As directed      Discharge instructions    Complete by:  As directed   Please see discharge instruction sheets.  Also refer to handout given an office.  Please call our office if you have any questions or concerns [phone]     Discharge instructions    Complete by:  As directed   Follow up with BRAY,BRYAN, RPH in 1 week. Follow up with General Surgery in 2 weeks.     Discharge  wound care:    Complete by:  As directed   You have an open wound that requires packing, please see wound care instructions.  In general, remove all dressings, wash wound with soap and water and then replace with saline moistened gauze.  Do the dressing change at least every day.  Please call our office [phone] if you have further questions.     Driving Restrictions    Complete by:  As directed   No driving until off narcotics and can safely swerve away without pain during an emergency     Increase activity slowly    Complete by:  As directed   Walk an hour a day.  Use 20-30 minute walks.  When you can walk 30 minutes without difficulty, increase to low impact/moderate activities such as biking, jogging, swimming, sexual activity..  Eventually can increase to unrestricted activity when not feeling pain.  If you feel pain: STOP!Marland Kitchen   Let pain protect you from overdoing it.  Use ice/heat/over-the-counter  pain medications to help minimize his soreness.  Use pain prescriptions as needed to remain active.  It is better to take extra pain medications and be more active than to stay bedridden to avoid all pain medications.     Increase activity slowly    Complete by:  As directed      Lifting restrictions    Complete by:  As directed   Avoid heavy lifting initially.  Do not push through pain.  You have no specific weight limit.  Coughing and sneezing or four more stressful to your incision than any lifting you will do. Pain will protect you from injury.  Therefore, avoid intense activity until off all narcotic pain medications.  Coughing and sneezing or four more stressful to your incision than any lifting he will do.     May shower / Bathe    Complete by:  As directed      May walk up steps    Complete by:  As directed      Sexual Activity Restrictions    Complete by:  As directed   Sexual activity as tolerated.  Do not push through pain.  Pain will protect you from injury.     Walk with  assistance    Complete by:  As directed   Walk over an hour a day.  May use a walker/cane/companion to help with balance and stamina.            Medication List    STOP taking these medications       doxycycline 100 MG capsule  Commonly known as:  VIBRAMYCIN      TAKE these medications       amoxicillin-clavulanate 875-125 MG per tablet  Commonly known as:  AUGMENTIN  Take 1 tablet by mouth every 12 (twelve) hours. Take for 12 days then stop.     aspirin 81 MG tablet  Take 81 mg by mouth daily.     CALCIUM ANTACID ULTRA 400 MG tablet  Generic drug:  calcium elemental as carbonate  Chew 1,000 mg by mouth 3 (three) times daily.     cetirizine 10 MG chewable tablet  Commonly known as:  ZYRTEC  Chew 10 mg by mouth daily.     DULoxetine 60 MG capsule  Commonly known as:  CYMBALTA  Take 60 mg by mouth once a week. Patient has been tapering off of Cymbalta and is currently on her last week of "once weekly."  As of 03/09/14, patient reports that she no longer needs further doses.     ferrous sulfate 325 (65 FE) MG tablet  Take 325 mg by mouth daily with breakfast.     hydrochlorothiazide 25 MG tablet  Commonly known as:  HYDRODIURIL  Take 25 mg by mouth every other day.     LIVALO 4 MG Tabs  Generic drug:  Pitavastatin Calcium  Take 4 mg by mouth every other day.     losartan 50 MG tablet  Commonly known as:  COZAAR  Take 50 mg by mouth daily.     metFORMIN 1000 MG tablet  Commonly known as:  GLUCOPHAGE  Take 1,000 mg by mouth 2 (two) times daily with a meal.     NOVOLOG MIX 70/30 FLEXPEN (70-30) 100 UNIT/ML Pen  Generic drug:  Insulin Aspart Prot & Aspart  Inject 50 Units into the skin 2 (two) times daily.     omega-3 acid ethyl esters 1 G capsule  Commonly known as:  LOVAZA  Take 1 g by mouth daily.     oxyCODONE 5 MG immediate release tablet  Commonly known as:  Oxy IR/ROXICODONE  Take 1 tablet (5 mg total) by mouth every 4 (four) hours as needed for moderate  pain, severe pain or breakthrough pain.     pantoprazole 40 MG tablet  Commonly known as:  PROTONIX  Take 40 mg by mouth daily.     Vitamin D 1000 UNITS capsule  Take 1,000 Units by mouth daily.       Allergies  Allergen Reactions  . Biaxin [Clarithromycin]     Makes her feel blah   Follow-up Information   Follow up with [facility] In 2 weeks. (To have your wound re-checked, To follow up after your operation)    Contact information:   [place] [address] [phone]       Follow up with [facility]. Schedule an appointment as soon as possible for a visit in 1 week.   Specialty:  Pharmacist       The results of significant diagnostics from this hospitalization (including imaging, microbiology, ancillary and laboratory) are listed below for reference.    Significant Diagnostic Studies: No results found.  Microbiology: Recent Results (from the past 240 hour(s))  CULTURE, ROUTINE-ABSCESS     Status: None   Collection Time    03/08/14  1:58 AM      Result Value Ref Range Status   Specimen Description ABDOMEN   Final   Special Requests NONE   Final   Gram Stain     Final   Value: ABUNDANT WBC PRESENT,BOTH PMN AND MONONUCLEAR     NO SQUAMOUS EPITHELIAL CELLS SEEN     FEW GRAM POSITIVE COCCI     IN PAIRS FEW GRAM VARIABLE ROD     Performed at [facility]   Culture     Final   Value: MULTIPLE ORGANISMS PRESENT, NONE PREDOMINANT     Note: NO STAPHYLOCOCCUS AUREUS ISOLATED NO GROUP A STREP (S.PYOGENES) ISOLATED     Performed at [facility]   Report Status 03/11/2014 FINAL   Final  MRSA PCR SCREENING     Status: None   Collection Time    03/08/14  2:02 PM      Result Value Ref Range Status   MRSA by PCR NEGATIVE  NEGATIVE Final   Comment:            The GeneXpert MRSA Assay (FDA     approved for NASAL specimens     only), is one component of a     comprehensive MRSA colonization     surveillance  program. It is not     intended to diagnose MRSA     infection nor to guide or     monitor treatment for     MRSA infections.  ANAEROBIC CULTURE     Status: None   Collection Time    03/08/14  2:15 PM      Result Value Ref Range Status   Specimen Description ABDOMEN ABSCESS   Final   Special Requests PATIENT ON FOLLOWING ZOYSN   Final   Gram Stain     Final   Value: RARE WBC PRESENT, PREDOMINANTLY MONONUCLEAR     NO SQUAMOUS EPITHELIAL CELLS SEEN     RARE GRAM POSITIVE COCCI IN PAIRS     Performed at [facility]  Final   Value: NO ANAEROBES ISOLATED; CULTURE IN PROGRESS FOR 5 DAYS     Performed at [facility]   Report Status PENDING   Incomplete  CULTURE, ROUTINE-ABSCESS     Status: None   Collection Time    03/08/14  2:15 PM      Result Value Ref Range Status   Specimen Description ABDOMEN ABSCESS   Final   Special Requests PATIENT ON FOLLOWING ZOYSN   Final   Gram Stain     Final   Value: RARE WBC PRESENT, PREDOMINANTLY MONONUCLEAR     NO SQUAMOUS EPITHELIAL CELLS SEEN     RARE GRAM POSITIVE COCCI IN PAIRS     Performed at [facility]   Culture     Final   Value: MODERATE MICROAEROPHILIC STREPTOCOCCI     Note: Standardized susceptibility testing for this organism is not available.     Performed at [facility]   Report Status 03/11/2014 FINAL   Final     Labs: Basic Metabolic Panel:  Recent Labs Lab 03/08/14 0057 03/08/14 0500 03/09/14 0544 03/10/14 0553 03/11/14 0504  NA 136* 137 140 142 142  K 3.5* 3.9 4.2 4.3 3.6*  CL 99 99 104 107 105  CO2  --  23 26 24 26   GLUCOSE 202* 194* 164* 176* 77  BUN 14 13 9 7 8   CREATININE 0.80 0.69 0.75 0.68 0.72  CALCIUM  --  9.6 9.1 9.0 9.1   Liver Function Tests:  Recent Labs Lab 03/08/14 0500  AST 12  ALT 8  ALKPHOS 90  BILITOT 0.6  PROT 7.1  ALBUMIN 3.1*   No results found for this basename: LIPASE, AMYLASE,  in the last 168 hours No results found for this  basename: AMMONIA,  in the last 168 hours CBC:  Recent Labs Lab 03/08/14 0047 03/08/14 0057 03/08/14 0500 03/09/14 0544 03/10/14 0553 03/11/14 0504  WBC 9.8  --  10.0 7.4 6.0 6.9  NEUTROABS 7.0  --   --  4.3  --   --   HGB 11.1* 12.6 10.3* 9.2* 8.7* 8.5*  HCT 33.5* 37.0 31.4* 28.5* 26.5* 26.9*  MCV 70.4*  --  71.5* 72.7* 71.6* 72.1*  PLT 307  --  297 283 283 300   Cardiac Enzymes: No results found for this basename: CKTOTAL, CKMB, CKMBINDEX, TROPONINI,  in the last 168 hours BNP: BNP (last 3 results) No results found for this basename: PROBNP,  in the last 8760 hours CBG:  Recent Labs Lab 03/10/14 1129 03/10/14 1620 03/10/14 2113 03/11/14 0745 03/11/14 1222  GLUCAP 105* 183* 111* 98 75       Signed:  Khalin Royce MD [facility] 03/11/2014, 2:32 PM

## 2014-03-11 NOTE — Care Management Note (Unsigned)
    Page 1 of 1   03/11/2014     3:57:02 PM CARE MANAGEMENT NOTE 03/11/2014  Patient:  Dominique Dillon, Dominique Dillon   Account Number:  [id number]  Date Initiated:  03/11/2014  Documentation initiated by:  [facility]  Subjective/Objective Assessment:   58 year old female admitted with abscess.     Action/Plan:   From home, needs [facility] for wound care.   Anticipated DC Date:  03/14/2014   Anticipated DC Plan:  [place]  CM consult      Choice offered to / List presented to:  C-1 Patient        [place] arranged  HH-1 RN      [place].   Status of service:  Completed, signed off Medicare Important Message given?   (If response is "NO", the following Medicare IM given date fields will be blank) Date Medicare IM given:   Medicare IM given by:   Date Additional Medicare IM given:   Additional Medicare IM given by:    Discharge Disposition:  [place]  Per UR Regulation:  Reviewed for med. necessity/level of care/duration of stay  If discussed at [place] of Stay Meetings, dates discussed:    Comments:  03/11/14 Dominique Dillon RN BSN Met with pt at bedside to discuss home health services. She stated she lives alone but will ask her daughters to assist with the dressing changes. Referral made to [facility] with [place].

## 2014-03-11 NOTE — Progress Notes (Signed)
ANTIBIOTIC CONSULT NOTE - follow-up  Pharmacy Consult for Vancomycin and Zosyn Indication: Cellulitis  Allergies  Allergen Reactions  . Biaxin [Clarithromycin]     Makes her feel blah    Patient Measurements: Height: 5\' 3"  (160 cm) Weight: 219 lb 2.2 oz (99.4 kg) IBW/kg (Calculated) : 52.4  Vital Signs: Temp: 98.1 F (36.7 C) (08/03 0619) Temp src: Oral (08/03 0619) BP: 134/73 mmHg (08/03 0619) Pulse Rate: 71 (08/03 0619) Intake/Output from previous day: 08/02 0701 - 08/03 0700 In: 260 [P.O.:260] Out: -  Intake/Output from this shift:    Labs:  Recent Labs  03/09/14 0544 03/10/14 0553 03/11/14 0504  WBC 7.4 6.0 6.9  HGB 9.2* 8.7* 8.5*  PLT 283 283 300  CREATININE 0.75 0.68 0.72   Estimated Creatinine Clearance: 85.1 ml/min (by C-G formula based on Cr of 0.72). No results found for this basename: VANCOTROUGH, Corlis Leak, VANCORANDOM, GENTTROUGH, GENTPEAK, GENTRANDOM, TOBRATROUGH, TOBRAPEAK, TOBRARND, AMIKACINPEAK, AMIKACINTROU, AMIKACIN,  in the last 72 hours   Microbiology: Recent Results (from the past 720 hour(s))  CULTURE, ROUTINE-ABSCESS     Status: None   Collection Time    03/08/14  1:58 AM      Result Value Ref Range Status   Specimen Description ABDOMEN   Final   Special Requests NONE   Final   Gram Stain     Final   Value: ABUNDANT WBC PRESENT,BOTH PMN AND MONONUCLEAR     NO SQUAMOUS EPITHELIAL CELLS SEEN     FEW GRAM POSITIVE COCCI     IN PAIRS FEW GRAM VARIABLE ROD     Performed at [facility]   Culture     Final   Value: MULTIPLE ORGANISMS PRESENT, NONE PREDOMINANT     Performed at [facility]   Report Status PENDING   Incomplete  MRSA PCR SCREENING     Status: None   Collection Time    03/08/14  2:02 PM      Result Value Ref Range Status   MRSA by PCR NEGATIVE  NEGATIVE Final   Comment:            The GeneXpert MRSA Assay (FDA     approved for NASAL specimens     only), is one component of a     comprehensive MRSA  colonization     surveillance program. It is not     intended to diagnose MRSA     infection nor to guide or     monitor treatment for     MRSA infections.  ANAEROBIC CULTURE     Status: None   Collection Time    03/08/14  2:15 PM      Result Value Ref Range Status   Specimen Description ABDOMEN ABSCESS   Final   Special Requests PATIENT ON FOLLOWING ZOYSN   Final   Gram Stain     Final   Value: RARE WBC PRESENT, PREDOMINANTLY MONONUCLEAR     NO SQUAMOUS EPITHELIAL CELLS SEEN     RARE GRAM POSITIVE COCCI IN PAIRS     Performed at [facility]   Culture     Final   Value: NO ANAEROBES ISOLATED; CULTURE IN PROGRESS FOR 5 DAYS     Performed at [facility]   Report Status PENDING   Incomplete  CULTURE, ROUTINE-ABSCESS     Status: None   Collection Time    03/08/14  2:15 PM      Result Value Ref Range Status  Specimen Description ABDOMEN ABSCESS   Final   Special Requests PATIENT ON FOLLOWING ZOYSN   Final   Gram Stain     Final   Value: RARE WBC PRESENT, PREDOMINANTLY MONONUCLEAR     NO SQUAMOUS EPITHELIAL CELLS SEEN     RARE GRAM POSITIVE COCCI IN PAIRS     Performed at [facility]   Culture     Final   Value: MODERATE MICROAEROPHILIC STREPTOCOCCI     Note: Standardized susceptibility testing for this organism is not available.     Performed at [facility]   Report Status 03/11/2014 FINAL   Final   Assessment: 58 year old female with a history of diabetes mellitus, HTN, obesity and HLD who was admitted last night with an abdominal wall abscess. The patient reports developing erythema and pain to her abdomen after administering insulin last Sunday. It worsened with time. She was seen by her PCP on Monday and started on doxycycline. Despite this, the redness increased. She reports yesterday, the the abscess began draining spontaneously. Originally started on IV Cipro when admitted then discontinued and pharmacy consulted to dose Vanc and Zoxyn.  I&D of abdominal wall complete 7/31.   7/31 >>clindamycin >>x1 ER 7/31 >>cipro >> 7/31 7/31>>doxy >>7/31 7/31 >>Vanc >> 7/31 >>Zosyn >>  Temp: afeb WBC: WNL Renal: SCr WNL, normalized CrCl = 52ml/min  Anaerobic fluid Cx (abscess) 0/94: microaerophilic strep  Drug level / dose changes info:  Goal of Therapy:   Vancomycin trough 10-15 mcg/ml  Eradication of infection  Plan:  Day #4 vancomycin/zosyn  Continue Vancomycin 1250mg  IV q12h  Plan for vancomycin trough tomorrow if continues  Continue Zosyn 3.375mg  IV q8h each dose over 4hr infusion  Beta-lactam therapy preferred from microaerophilic streptococcus  Monitor renal function, follow cultures  Doreene Eland, PharmD, BCPS.   Pager: [phone] 03/11/2014 8:12 AM

## 2014-03-11 NOTE — Progress Notes (Signed)
3 Days Post-Op  Subjective: She is doing well and is getting ready to shower.    Objective: Vital signs in last 24 hours: Temp:  [98.1 F (36.7 C)-98.6 F (37 C)] 98.1 F (36.7 C) (08/03 0619) Pulse Rate:  [71-80] 71 (08/03 0619) Resp:  [18] 18 (08/03 0619) BP: (132-139)/(71-75) 134/73 mmHg (08/03 0619) SpO2:  [99 %-100 %] 99 % (08/03 0619) Weight:  [99.4 kg (219 lb 2.2 oz)] 99.4 kg (219 lb 2.2 oz) (08/03 0507) Last BM Date: 03/10/14 260 PO recorded  Diet: carb-mod- low na Afebrile, VSS K+ 3.6 H/H: low but stable Intake/Output from previous day: 08/02 0701 - 08/03 0700 In: 260 [P.O.:260] Out: -  Intake/Output this shift:    General appearance: alert, cooperative and no distress GI: the abd is soft, + Bs tolerating diet.  she is very tender at the open abscess site.  She cannot see it. so I explored with applicator stick and showed her how deep it was.  Plan to pack with wet to dry 4 x 4's, she can shower with it open.  Lab Results:   Recent Labs  03/10/14 0553 03/11/14 0504  WBC 6.0 6.9  HGB 8.7* 8.5*  HCT 26.5* 26.9*  PLT 283 300    BMET  Recent Labs  03/10/14 0553 03/11/14 0504  NA 142 142  K 4.3 3.6*  CL 107 105  CO2 24 26  GLUCOSE 176* 77  BUN 7 8  CREATININE 0.68 0.72  CALCIUM 9.0 9.1   PT/INR No results found for this basename: LABPROT, INR,  in the last 72 hours   Recent Labs Lab 03/08/14 0500  AST 12  ALT 8  ALKPHOS 90  BILITOT 0.6  PROT 7.1  ALBUMIN 3.1*     Lipase  No results found for this basename: lipase     Studies/Results: No results found.  Medications: . aspirin  81 mg Oral Daily  . atorvastatin  20 mg Oral q1800  . enoxaparin (LOVENOX) injection  40 mg Subcutaneous Q24H  . ferrous sulfate  325 mg Oral Q breakfast  . insulin aspart  0-15 Units Subcutaneous TID WC  . insulin aspart  0-5 Units Subcutaneous QHS  . insulin aspart protamine- aspart  50 Units Subcutaneous BID  . lip balm  1 application Topical BID  .  loratadine  10 mg Oral Daily  . omega-3 acid ethyl esters  1 g Oral Daily  . pantoprazole  40 mg Oral Daily  . piperacillin-tazobactam (ZOSYN)  IV  3.375 g Intravenous Q8H  . sodium chloride  3 mL Intravenous Q12H  . vancomycin  1,250 mg Intravenous Q12H    Assessment/Plan abdominal wall abcess, s/p IRRIGATION AND DEBRIDEMENT ABDOMINAL WALL ABSCESS, 03/08/2014,Steven C. Gross, MD  Obesity Body mass index is 38.83 kg/(m^2). AODM Hypertension Hyperlipidemia GERD   Plan:  From our standpoint she is doing fine.  She can pack this wet to dry with open 4 x 4's, she can shower and get soap and water in it .  We can see her back in the office in 2-3 weeks.  She has one area that tracts down and I let her see how deep this was.  LOS: 4 days    Dominique Dillon 03/11/2014

## 2014-03-13 LAB — ANAEROBIC CULTURE

## 2014-03-19 ENCOUNTER — Telehealth: Payer: Self-pay | Admitting: Hematology and Oncology

## 2014-03-19 NOTE — Telephone Encounter (Signed)
LEFT MESSAGE FOR PATIENT AND GAVE NP APPT FOR 08/18 @ 2 W/DR. [place].

## 2014-03-26 ENCOUNTER — Encounter (INDEPENDENT_AMBULATORY_CARE_PROVIDER_SITE_OTHER): Payer: Self-pay

## 2014-03-26 ENCOUNTER — Ambulatory Visit: Payer: BC Managed Care – PPO | Admitting: Hematology and Oncology

## 2014-03-26 ENCOUNTER — Encounter (INDEPENDENT_AMBULATORY_CARE_PROVIDER_SITE_OTHER): Payer: Self-pay | Admitting: Surgery

## 2014-03-26 ENCOUNTER — Ambulatory Visit (INDEPENDENT_AMBULATORY_CARE_PROVIDER_SITE_OTHER): Payer: BC Managed Care – PPO | Admitting: Surgery

## 2014-03-26 ENCOUNTER — Ambulatory Visit: Payer: BC Managed Care – PPO

## 2014-03-26 VITALS — BP 130/80 | HR 75 | Temp 97.0°F | Ht 63.0 in | Wt 212.0 lb

## 2014-03-26 DIAGNOSIS — L02219 Cutaneous abscess of trunk, unspecified: Secondary | ICD-10-CM

## 2014-03-26 DIAGNOSIS — L03319 Cellulitis of trunk, unspecified: Secondary | ICD-10-CM

## 2014-03-26 DIAGNOSIS — L02211 Cutaneous abscess of abdominal wall: Secondary | ICD-10-CM

## 2014-03-26 NOTE — Patient Instructions (Signed)
WOUND CARE  It is important that the wound be kept open.   -Keeping the skin edges apart will allow the wound to gradually heal from the base upwards.   - If the skin edges of the wound close too early, a new fluid pocket can form and infection can occur. -This is the reason to pack deeper wounds with gauze or ribbon -This is why drained wounds cannot be sewed closed right away  A healthy wound should form a lining of bright red "beefy" granulating tissue that will help shrink the wound and help the edges grow new skin into it.   -A little mucus / yellow discharge is normal (the body's natural way to try and form a scab) and should be gently washed off with soap and water with daily dressing changes.  -Green or foul smelling drainage implies bacterial colonization and can slow wound healing - a short course of antibiotic ointment (3-5 days) can help it clear up.  Call the doctor if it does not improve or worsens  -Avoid use of antibiotic ointments for more than a week as they can slow wound healing over time.    -Sometimes other wound care products will be used to reduce need for dressing changes and/or help clean up dirty wounds -Sometimes the surgeon needs to debride the wound in the office to remove dead or infected tissue out of the wound so it can heal more quickly and safely.    Change the dressing at least once a day -Wash the wound with mild soap and water gently every day.  It is good to shower or bathe the wound to help it clean out. -Use clean 4x4 gauze for medium/large wounds or ribbon plain NU-gauze for smaller wounds (it does not need to be sterile, just clean) -Keep the raw wound moist with a little saline or KY (saline) gel on the gauze.  -A dry wound will take longer to heal.  -Keep the skin dry around the wound to prevent breakdown and irritation. -Pack the wound down to the base -The goal is to keep the skin apart, not overpack the wound -Use a Q-tip or blunt-tipped kabob  stick toothpick to push the gauze down to the base in narrow or deep wounds   -Cover with a clean gauze and tape -paper or Medipore tape tend to be gentle on the skin -rotate the orientation of the tape to avoid repeated stress/trauma on the skin -using an ACE or Coban wrap on wounds on arms or legs can be used instead.  Complete all antibiotics through the entire prescription to help the infection heal and prevent new places of infection   Returning the see the surgeon is helpful to follow the healing process and help the wound close as fast as possible.  Exercise to Stay Healthy Exercise helps you become and stay healthy. EXERCISE IDEAS AND TIPS Choose exercises that:  You enjoy.  Fit into your day. You do not need to exercise really hard to be healthy. You can do exercises at a slow or medium level and stay healthy. You can:  Stretch before and after working out.  Try yoga, Pilates, or tai chi.  Lift weights.  Walk fast, swim, jog, run, climb stairs, bicycle, dance, or rollerskate.  Take aerobic classes. Exercises that burn about 150 calories:  Running 1  miles in 15 minutes.  Playing volleyball for 45 to 60 minutes.  Washing and waxing a car for 45 to 60 minutes.  Playing  touch football for 45 minutes.  Walking 1  miles in 35 minutes.  Pushing a stroller 1  miles in 30 minutes.  Playing basketball for 30 minutes.  Raking leaves for 30 minutes.  Bicycling 5 miles in 30 minutes.  Walking 2 miles in 30 minutes.  Dancing for 30 minutes.  Shoveling snow for 15 minutes.  Swimming laps for 20 minutes.  Walking up stairs for 15 minutes.  Bicycling 4 miles in 15 minutes.  Gardening for 30 to 45 minutes.  Jumping rope for 15 minutes.  Washing windows or floors for 45 to 60 minutes. Document Released: 08/28/2010 Document Revised: 10/18/2011 Document Reviewed: 08/28/2010 [facility] Patient Information 2015 [place], [place]. This information is not intended  to replace advice given to you by your health care provider. Make sure you discuss any questions you have with your health care provider.

## 2014-03-26 NOTE — Progress Notes (Signed)
Subjective:     Patient ID: Dominique Dillon, female   DOB: 02/16/1956, 58 y.o.   MRN: [id number]  HPI  Note: Portions of this report may have been transcribed using voice recognition software. Every effort was made to ensure accuracy; however, inadvertent computerized transcription errors may be present.   Any transcriptional errors that result from this process are unintentional.       Dominique Dillon  1956-08-08 [id number]  Patient Care Team: Reinaldo Meeker, [facility] as PCP - General (Pharmacist) Horatio Pel, MD as Referring Physician (Internal Medicine) Deatra Robinson, MD as Consulting Physician (Oncology)  Procedure (Date: 03/08/2014):  POST-OPERATIVE DIAGNOSIS: abdominal wall abscess  PROCEDURE: Procedure(s):  IRRIGATION AND DEBRIDEMENT ABDOMINAL WALL ABSCESS  SURGEON: Surgeon(s):  Adin Hector, MD  ASSISTANT: RN   This patient returns for surgical re-evaluation.  She comes today with her daughter.  She is feeling better.  Glucose is running 69-160.  Only over 200 once.  Hoping to get back to work but does a lot of lifting and activity and is afraid overdoing it.  No fevers or chills.  Off antibiotics.  Her daughter does wound packing once a day.  Patient Active Problem List   Diagnosis Date Noted  . Abscess of abdominal wall s/p I&D 7/30- & 03/08/2014 03/08/2014  . Cellulitis 03/08/2014  . Diabetes mellitus 03/08/2014  . Essential hypertension, benign 03/08/2014  . GERD (gastroesophageal reflux disease) 03/08/2014  . Anemia 03/08/2014    Past Medical History  Diagnosis Date  . GERD (gastroesophageal reflux disease)   . Hypertension   . Hyperlipidemia   . Obesity   . Diabetes mellitus without complication     Past Surgical History  Procedure Laterality Date  . C section x 2    . Explorartory lap    . Bilatreral knee surgery    . Abdominal hysterectomy    . Cholecystectomy    . Abdominal surgery    . Appendectomy    . Tonsillectomy    . Irrigation and  debridement abscess N/A 03/08/2014    Procedure: IRRIGATION AND DEBRIDEMENT ABDOMINAL WALL ABSCESS;  Surgeon: Adin Hector, MD;  Location: [facility];  Service: General;  Laterality: N/A;    History   Social History  . Marital Status: Widowed    Spouse Name: N/A    Number of Children: N/A  . Years of Education: N/A   Occupational History  . Not on file.   Social History Main Topics  . Smoking status: Never Smoker   . Smokeless tobacco: Never Used  . Alcohol Use: No  . Drug Use: No  . Sexual Activity: Not on file   Other Topics Concern  . Not on file   Social History Narrative  . No narrative on file    Family History  Problem Relation Age of Onset  . Asthma Other   . Cancer Other   . Hyperlipidemia Other   . Heart disease Other   . Hypertension Other   . Stroke Other     Current Outpatient Prescriptions  Medication Sig Dispense Refill  . aspirin 81 MG tablet Take 81 mg by mouth daily.      . calcium elemental as carbonate (CALCIUM ANTACID ULTRA) 400 MG tablet Chew 1,000 mg by mouth 3 (three) times daily.      . cetirizine (ZYRTEC) 10 MG chewable tablet Chew 10 mg by mouth daily.      . Cholecalciferol (VITAMIN D) 1000 UNITS capsule Take 1,000 Units by mouth  daily.      . DULoxetine (CYMBALTA) 60 MG capsule Take 60 mg by mouth once a week. Patient has been tapering off of Cymbalta and is currently on her last week of "once weekly."  As of 03/09/14, patient reports that she no longer needs further doses.      . ferrous sulfate 325 (65 FE) MG tablet Take 325 mg by mouth daily with breakfast.      . hydrochlorothiazide (HYDRODIURIL) 25 MG tablet Take 25 mg by mouth every other day.       . losartan (COZAAR) 50 MG tablet Take 50 mg by mouth daily.      . metFORMIN (GLUCOPHAGE) 1000 MG tablet Take 1,000 mg by mouth 2 (two) times daily with a meal.      . NOVOLOG MIX 70/30 FLEXPEN (70-30) 100 UNIT/ML Pen Inject 50 Units into the skin 2 (two) times daily.       Marland Kitchen omega-3 acid  ethyl esters (LOVAZA) 1 G capsule Take 1 g by mouth daily.      Marland Kitchen oxyCODONE (OXY IR/ROXICODONE) 5 MG immediate release tablet Take 1 tablet (5 mg total) by mouth every 4 (four) hours as needed for moderate pain, severe pain or breakthrough pain.  20 tablet  0  . pantoprazole (PROTONIX) 40 MG tablet Take 40 mg by mouth daily.      . Pitavastatin Calcium (LIVALO) 4 MG TABS Take 4 mg by mouth every other day.       No current facility-administered medications for this visit.     Allergies  Allergen Reactions  . Biaxin [Clarithromycin]     Makes her feel blah    BP 130/80  Pulse 75  Temp(Src) 97 F (36.1 C)  Ht 5\' 3"  (1.6 m)  Wt 212 lb (96.163 kg)  BMI 37.56 kg/m2  No results found.   Review of Systems  Constitutional: Negative for fever, chills and diaphoresis.  HENT: Negative for ear pain, sore throat and trouble swallowing.   Eyes: Negative for photophobia and visual disturbance.  Respiratory: Negative for cough and choking.   Cardiovascular: Negative for chest pain and palpitations.  Gastrointestinal: Negative for nausea, vomiting, diarrhea, constipation, blood in stool, abdominal distention, anal bleeding and rectal pain.  Genitourinary: Negative for dysuria, frequency and difficulty urinating.  Musculoskeletal: Positive for arthralgias. Negative for gait problem and myalgias.  Skin: Positive for wound. Negative for color change, pallor and rash.  Neurological: Negative for dizziness, speech difficulty, weakness and numbness.  Hematological: Negative for adenopathy.  Psychiatric/Behavioral: Negative for confusion and agitation. The patient is not nervous/anxious.        Objective:   Physical Exam  Constitutional: She is oriented to person, place, and time. She appears well-developed and well-nourished. No distress.  HENT:  Head: Normocephalic.  Mouth/Throat: Oropharynx is clear and moist. No oropharyngeal exudate.  Eyes: Conjunctivae and EOM are normal. Pupils are  equal, round, and reactive to light. No scleral icterus.  Neck: Normal range of motion. No tracheal deviation present.  Cardiovascular: Normal rate and intact distal pulses.   Pulmonary/Chest: Effort normal. No respiratory distress. She exhibits no tenderness.  Abdominal: Soft. She exhibits no distension. There is no tenderness. No hernia. Hernia confirmed negative in the ventral area, confirmed negative in the right inguinal area and confirmed negative in the left inguinal area.    Incisions clean with normal healing ridges.  No hernias  Genitourinary: No vaginal discharge found.  Musculoskeletal: Normal range of motion. She exhibits no tenderness.  Lymphadenopathy:       Right: No inguinal adenopathy present.       Left: No inguinal adenopathy present.  Neurological: She is alert and oriented to person, place, and time. No cranial nerve deficit. She exhibits normal muscle tone. Coordination normal.  Skin: Skin is warm and dry. No rash noted. She is not diaphoretic.  Psychiatric: She has a normal mood and affect. Her behavior is normal.       Assessment:     Recovering well status post incision and drainage of infected hematoma.     Plan:     Increase activity as tolerated to regular activity.  Low impact exercise such as walking an hour a day at least ideal.  Do not push through pain.  Okay to go back to work at least part-time soon.  Continue wound care.  Daily dressing changes.  We will probably will close in one to 2 months.  Diet as tolerated.  Low fat high fiber diet ideal.  Bowel regimen with 30 g fiber a day and fiber supplement as needed to avoid problems.  Return to clinic q4-6 weeks until wound closed, sooner as needed.   Instructions discussed.  Followup with primary care physician for other health issues as would normally be done.  Consider screening for malignancies (breast, prostate, colon, melanoma, etc) as appropriate.  Questions answered.  The patient expressed  understanding and appreciation

## 2014-04-30 ENCOUNTER — Encounter (INDEPENDENT_AMBULATORY_CARE_PROVIDER_SITE_OTHER): Payer: BC Managed Care – PPO | Admitting: Surgery

## 2014-08-07 ENCOUNTER — Other Ambulatory Visit (HOSPITAL_COMMUNITY): Payer: Self-pay | Admitting: Obstetrics and Gynecology

## 2014-08-07 DIAGNOSIS — Z1231 Encounter for screening mammogram for malignant neoplasm of breast: Secondary | ICD-10-CM

## 2014-08-15 ENCOUNTER — Ambulatory Visit (HOSPITAL_COMMUNITY)
Admission: RE | Admit: 2014-08-15 | Discharge: 2014-08-15 | Disposition: A | Discharge status: Home / Self Care | Payer: BC Managed Care – PPO | Source: Ambulatory Visit | Attending: Obstetrics and Gynecology | Admitting: Obstetrics and Gynecology

## 2014-08-15 DIAGNOSIS — R928 Other abnormal and inconclusive findings on diagnostic imaging of breast: Secondary | ICD-10-CM | POA: Insufficient documentation

## 2014-08-15 DIAGNOSIS — Z1231 Encounter for screening mammogram for malignant neoplasm of breast: Secondary | ICD-10-CM | POA: Insufficient documentation

## 2014-08-16 ENCOUNTER — Other Ambulatory Visit: Payer: Self-pay | Admitting: Obstetrics and Gynecology

## 2014-08-16 DIAGNOSIS — R928 Other abnormal and inconclusive findings on diagnostic imaging of breast: Secondary | ICD-10-CM

## 2014-08-21 ENCOUNTER — Other Ambulatory Visit: Payer: Self-pay | Admitting: Obstetrics and Gynecology

## 2014-08-21 DIAGNOSIS — R928 Other abnormal and inconclusive findings on diagnostic imaging of breast: Secondary | ICD-10-CM

## 2014-08-30 ENCOUNTER — Other Ambulatory Visit: Payer: BC Managed Care – PPO

## 2014-09-09 ENCOUNTER — Other Ambulatory Visit: Payer: BC Managed Care – PPO

## 2014-09-09 ENCOUNTER — Ambulatory Visit
Admission: RE | Admit: 2014-09-09 | Discharge: 2014-09-09 | Disposition: A | Discharge status: Home / Self Care | Payer: BC Managed Care – PPO | Source: Ambulatory Visit | Attending: Obstetrics and Gynecology | Admitting: Obstetrics and Gynecology

## 2014-09-09 ENCOUNTER — Other Ambulatory Visit: Payer: Self-pay | Admitting: Obstetrics and Gynecology

## 2014-09-09 DIAGNOSIS — R928 Other abnormal and inconclusive findings on diagnostic imaging of breast: Secondary | ICD-10-CM

## 2014-09-09 HISTORY — PX: BREAST BIOPSY: SHX20

## 2014-09-11 ENCOUNTER — Ambulatory Visit
Admission: RE | Admit: 2014-09-11 | Discharge: 2014-09-11 | Disposition: A | Discharge status: Home / Self Care | Payer: BC Managed Care – PPO | Source: Ambulatory Visit | Attending: Obstetrics and Gynecology | Admitting: Obstetrics and Gynecology

## 2014-09-11 DIAGNOSIS — R928 Other abnormal and inconclusive findings on diagnostic imaging of breast: Secondary | ICD-10-CM

## 2014-09-12 ENCOUNTER — Encounter: Payer: Self-pay | Admitting: *Deleted

## 2014-09-12 ENCOUNTER — Telehealth: Payer: Self-pay | Admitting: *Deleted

## 2014-09-12 DIAGNOSIS — C50412 Malignant neoplasm of upper-outer quadrant of left female breast: Secondary | ICD-10-CM

## 2014-09-12 HISTORY — DX: Malignant neoplasm of upper-outer quadrant of left female breast: C50.412

## 2014-09-12 NOTE — Telephone Encounter (Signed)
Confirmed [facility] for 09/18/14 at 1200.  Instructions and contact information given.

## 2014-09-18 ENCOUNTER — Encounter: Payer: Self-pay | Admitting: Oncology

## 2014-09-18 ENCOUNTER — Other Ambulatory Visit (HOSPITAL_BASED_OUTPATIENT_CLINIC_OR_DEPARTMENT_OTHER): Payer: BC Managed Care – PPO

## 2014-09-18 ENCOUNTER — Ambulatory Visit: Payer: BC Managed Care – PPO

## 2014-09-18 ENCOUNTER — Ambulatory Visit: Payer: BC Managed Care – PPO | Attending: General Surgery | Admitting: Physical Therapy

## 2014-09-18 ENCOUNTER — Encounter: Payer: Self-pay | Admitting: Physical Therapy

## 2014-09-18 ENCOUNTER — Encounter (INDEPENDENT_AMBULATORY_CARE_PROVIDER_SITE_OTHER): Payer: Self-pay

## 2014-09-18 ENCOUNTER — Other Ambulatory Visit (INDEPENDENT_AMBULATORY_CARE_PROVIDER_SITE_OTHER): Payer: Self-pay | Admitting: General Surgery

## 2014-09-18 ENCOUNTER — Ambulatory Visit
Admission: RE | Admit: 2014-09-18 | Discharge: 2014-09-18 | Disposition: A | Discharge status: Home / Self Care | Payer: BC Managed Care – PPO | Source: Ambulatory Visit | Attending: Radiation Oncology | Admitting: Radiation Oncology

## 2014-09-18 ENCOUNTER — Ambulatory Visit (HOSPITAL_BASED_OUTPATIENT_CLINIC_OR_DEPARTMENT_OTHER): Payer: BC Managed Care – PPO | Admitting: Oncology

## 2014-09-18 VITALS — BP 136/68 | HR 89 | Temp 98.5°F | Resp 18 | Ht 63.0 in | Wt 204.9 lb

## 2014-09-18 DIAGNOSIS — M25511 Pain in right shoulder: Secondary | ICD-10-CM | POA: Diagnosis not present

## 2014-09-18 DIAGNOSIS — Z170 Estrogen receptor positive status [ER+]: Secondary | ICD-10-CM

## 2014-09-18 DIAGNOSIS — R293 Abnormal posture: Secondary | ICD-10-CM

## 2014-09-18 DIAGNOSIS — Z801 Family history of malignant neoplasm of trachea, bronchus and lung: Secondary | ICD-10-CM

## 2014-09-18 DIAGNOSIS — D630 Anemia in neoplastic disease: Secondary | ICD-10-CM

## 2014-09-18 DIAGNOSIS — Z808 Family history of malignant neoplasm of other organs or systems: Secondary | ICD-10-CM

## 2014-09-18 DIAGNOSIS — C50412 Malignant neoplasm of upper-outer quadrant of left female breast: Secondary | ICD-10-CM

## 2014-09-18 DIAGNOSIS — Z803 Family history of malignant neoplasm of breast: Secondary | ICD-10-CM

## 2014-09-18 DIAGNOSIS — E119 Type 2 diabetes mellitus without complications: Secondary | ICD-10-CM

## 2014-09-18 DIAGNOSIS — C50912 Malignant neoplasm of unspecified site of left female breast: Secondary | ICD-10-CM | POA: Diagnosis not present

## 2014-09-18 LAB — CBC WITH DIFFERENTIAL/PLATELET
BASO%: 0.8 % (ref 0.0–2.0)
Basophils Absolute: 0.1 10*3/uL (ref 0.0–0.1)
EOS%: 2.8 % (ref 0.0–7.0)
Eosinophils Absolute: 0.2 10*3/uL (ref 0.0–0.5)
HEMATOCRIT: 37.6 % (ref 34.8–46.6)
HGB: 11.8 g/dL (ref 11.6–15.9)
LYMPH#: 2 10*3/uL (ref 0.9–3.3)
LYMPH%: 29.5 % (ref 14.0–49.7)
MCH: 23 pg — AB (ref 25.1–34.0)
MCHC: 31.2 g/dL — ABNORMAL LOW (ref 31.5–36.0)
MCV: 73.5 fL — AB (ref 79.5–101.0)
MONO#: 0.5 10*3/uL (ref 0.1–0.9)
MONO%: 7.7 % (ref 0.0–14.0)
NEUT#: 4 10*3/uL (ref 1.5–6.5)
NEUT%: 59.2 % (ref 38.4–76.8)
Platelets: 347 10*3/uL (ref 145–400)
RBC: 5.12 10*6/uL (ref 3.70–5.45)
RDW: 15.5 % — ABNORMAL HIGH (ref 11.2–14.5)
WBC: 6.8 10*3/uL (ref 3.9–10.3)

## 2014-09-18 LAB — COMPREHENSIVE METABOLIC PANEL (CC13)
ALBUMIN: 4 g/dL (ref 3.5–5.0)
ALK PHOS: 99 U/L (ref 40–150)
ALT: 12 U/L (ref 0–55)
AST: 16 U/L (ref 5–34)
Anion Gap: 10 mEq/L (ref 3–11)
BUN: 11.9 mg/dL (ref 7.0–26.0)
CO2: 24 mEq/L (ref 22–29)
Calcium: 9.6 mg/dL (ref 8.4–10.4)
Chloride: 106 mEq/L (ref 98–109)
Creatinine: 0.9 mg/dL (ref 0.6–1.1)
EGFR: 79 mL/min/{1.73_m2} — ABNORMAL LOW (ref >90)
Glucose: 248 mg/dl — ABNORMAL HIGH (ref 70–140)
POTASSIUM: 3.8 meq/L (ref 3.5–5.1)
Sodium: 140 mEq/L (ref 136–145)
TOTAL PROTEIN: 7.5 g/dL (ref 6.4–8.3)
Total Bilirubin: 0.66 mg/dL (ref 0.20–1.20)

## 2014-09-18 NOTE — Patient Instructions (Signed)

## 2014-09-18 NOTE — Progress Notes (Signed)
Radiation Oncology         [phone] ________________________________  Initial outpatient Consultation  Name: Dominique Dillon MRN: [id number]  Date: 09/18/2014  DOB: [birth year]  XB:[id number],GTXM, FNP  Dominique Bookbinder, MD   REFERRING PHYSICIAN: Rolm Bookbinder, MD  DIAGNOSIS: Stage I [id number] Left Breast UOQ GRADE III Invasive ductal carcinoma, ER11%, PR neg, HER2neg     ICD-9-CM ICD-10-CM   1. Breast cancer of upper-outer quadrant of left female breast 174.4 C50.412     HISTORY OF PRESENT ILLNESS::Dominique Dillon is a 59 y.o. female who presented with a left breast mass of screening mammogram.  On [place] this was 67m.  Axilla negative on [place] Biopsy of left breast mass on 09-09-14 revealed GRADE III Invasive ductal carcinoma, ER11%, PR neg, HER2 neg..    She is here with two of her daughters.  She is in her USOH.  She works with babies in a daycare.  Family history positive for multiple cancers including 3 of her mother's siblings with pancreatic cancer.  PAST MEDICAL HISTORY:  has a past medical history of GERD (gastroesophageal reflux disease); Hypertension; Hyperlipidemia; Obesity; Diabetes mellitus without complication; Breast cancer of upper-outer quadrant of left female breast (09/12/2014); and Arthritis.    PAST SURGICAL HISTORY: Past Surgical History  Procedure Laterality Date  . C section x 2    . Explorartory lap    . Bilatreral knee surgery    . Abdominal hysterectomy    . Cholecystectomy    . Abdominal surgery    . Appendectomy    . Tonsillectomy    . Irrigation and debridement abscess N/A 03/08/2014    Procedure: IRRIGATION AND DEBRIDEMENT ABDOMINAL WALL ABSCESS;  Surgeon: SAdin Hector MD;  Location: [facility];  Service: General;  Laterality: N/A;    FAMILY HISTORY: family history includes Asthma in her other; Breast cancer in her maternal aunt; Cancer in her other; Heart disease in her other; Hyperlipidemia in her other; Hypertension in her other; Lung cancer  in her maternal grandmother; Pancreatic cancer in her maternal aunt, maternal uncle, and maternal uncle; Prostate cancer in her father; Stomach cancer in her maternal aunt; Stroke in her other.  SOCIAL HISTORY:  reports that she has never smoked. She has never used smokeless tobacco. She reports that she drinks alcohol. She reports that she does not use illicit drugs.  ALLERGIES: Biaxin  MEDICATIONS:  Current Outpatient Prescriptions  Medication Sig Dispense Refill  . aspirin 81 MG tablet Take 81 mg by mouth daily.    . calcium elemental as carbonate (CALCIUM ANTACID ULTRA) 400 MG tablet Chew 1,000 mg by mouth 3 (three) times daily.    . cetirizine (ZYRTEC) 10 MG chewable tablet Chew 10 mg by mouth daily.    . Cholecalciferol (VITAMIN D) 1000 UNITS capsule Take 1,000 Units by mouth daily.    . DULoxetine (CYMBALTA) 60 MG capsule Take 60 mg by mouth once a week. Patient has been tapering off of Cymbalta and is currently on her last week of "once weekly."  As of 03/09/14, patient reports that she no longer needs further doses.    . ferrous sulfate 325 (65 FE) MG tablet Take 325 mg by mouth daily with breakfast.    . hydrochlorothiazide (HYDRODIURIL) 25 MG tablet Take 25 mg by mouth every other day.     . losartan (COZAAR) 50 MG tablet Take 50 mg by mouth daily.    . metFORMIN (GLUCOPHAGE) 1000 MG tablet Take 1,000 mg by mouth 2 (two) times daily  with a meal.    . NOVOLOG MIX 70/30 FLEXPEN (70-30) 100 UNIT/ML Pen Inject 50 Units into the skin 2 (two) times daily.     Marland Kitchen omega-3 acid ethyl esters (LOVAZA) 1 G capsule Take 1 g by mouth daily.    Marland Kitchen oxyCODONE (OXY IR/ROXICODONE) 5 MG immediate release tablet Take 1 tablet (5 mg total) by mouth every 4 (four) hours as needed for moderate pain, severe pain or breakthrough pain. 20 tablet 0  . pantoprazole (PROTONIX) 40 MG tablet Take 40 mg by mouth daily.    . Pitavastatin Calcium (LIVALO) 4 MG TABS Take 4 mg by mouth every other day.     No current  facility-administered medications for this encounter.    REVIEW OF SYSTEMS:  Notable for that above.   PHYSICAL EXAM:   Vitals - 1 value per visit 1/89/8421  SYSTOLIC 031  DIASTOLIC 68  Pulse 89  Temperature 98.5  Respirations 18  Weight (lb) 204.9  Height '5\' 3"'   BMI 36.31  VISIT REPORT    General: Alert and oriented, in no acute distress HEENT: Head is normocephalic. Extraocular movements are intact. Oropharynx is clear. Neck: Neck is supple, no palpable cervical or supraclavicular lymphadenopathy. Heart: Regular in rate and rhythm with no murmurs, rubs, or gallops. Chest: Clear to auscultation bilaterally, with no rhonchi, wheezes, or rales. Abdomen: Soft, nontender, nondistended, with no rigidity or guarding. Extremities: No cyanosis or edema. Lymphatics: see Neck Exam Skin: No concerning lesions. Musculoskeletal: symmetric strength and muscle tone throughout. Neurologic: Cranial nerves II through XII are grossly intact. No obvious focalities. Speech is fluent. Coordination is intact. Psychiatric: Judgment and insight are intact. Affect is appropriate. Breasts: right nipple inversion (chronic per patient).  Upper outer quadrant biopsy scab in left breast but no palpable mass. No axillary nodes appreciated bilaterally.  ECOG = 0  0 - Asymptomatic (Fully active, able to carry on all predisease activities without restriction)  1 - Symptomatic but completely ambulatory (Restricted in physically strenuous activity but ambulatory and able to carry out work of a light or sedentary nature. For example, light housework, office work)  2 - Symptomatic, <50% in bed during the day (Ambulatory and capable of all self care but unable to carry out any work activities. Up and about more than 50% of waking hours)  3 - Symptomatic, >50% in bed, but not bedbound (Capable of only limited self-care, confined to bed or chair 50% or more of waking hours)  4 - Bedbound (Completely disabled. Cannot  carry on any self-care. Totally confined to bed or chair)  5 - Death   Eustace Pen MM, Creech RH, Tormey DC, et al. [phone]). "Toxicity and response criteria of the [facility]". [place] Oncol. 5 (6): 649-55   LABORATORY DATA:  Lab Results  Component Value Date   WBC 6.8 09/18/2014   HGB 11.8 09/18/2014   HCT 37.6 09/18/2014   MCV 73.5* 09/18/2014   PLT 347 09/18/2014   CMP     Component Value Date/Time   NA 140 09/18/2014 1155   NA 142 03/11/2014 0504   K 3.8 09/18/2014 1155   K 3.6* 03/11/2014 0504   CL 105 03/11/2014 0504   CO2 24 09/18/2014 1155   CO2 26 03/11/2014 0504   GLUCOSE 248* 09/18/2014 1155   GLUCOSE 77 03/11/2014 0504   BUN 11.9 09/18/2014 1155   BUN 8 03/11/2014 0504   CREATININE 0.9 09/18/2014 1155   CREATININE 0.72 03/11/2014 0504  CALCIUM 9.6 09/18/2014 1155   CALCIUM 9.1 03/11/2014 0504   PROT 7.5 09/18/2014 1155   PROT 7.1 03/08/2014 0500   ALBUMIN 4.0 09/18/2014 1155   ALBUMIN 3.1* 03/08/2014 0500   AST 16 09/18/2014 1155   AST 12 03/08/2014 0500   ALT 12 09/18/2014 1155   ALT 8 03/08/2014 0500   ALKPHOS 99 09/18/2014 1155   ALKPHOS 90 03/08/2014 0500   BILITOT 0.66 09/18/2014 1155   BILITOT 0.6 03/08/2014 0500   GFRNONAA >90 03/11/2014 0504   GFRAA >90 03/11/2014 0504         RADIOGRAPHY: Mm Digital Diagnostic Unilat L  09/09/2014   CLINICAL DATA:  Status post ultrasound-guided core biopsy left breast mass  EXAM: DIAGNOSTIC LEFT MAMMOGRAM POST ULTRASOUND BIOPSY  COMPARISON:  Previous exam(s).  FINDINGS: Mammographic images were obtained following left breast ultrasound guided biopsy of hypoechoic lesion at the left breast 1:30 o'clock. Cc and lateral views of the left breast demonstrate a coil biopsy clip is 5 mm from the mass of concern.  IMPRESSION: Post biopsy clip mammogram as described.  Final Assessment: Post Procedure Mammograms for Marker Placement   Electronically Signed   By: Abelardo Diesel M.D.   On: 09/09/2014  10:36   Mm Digital Diagnostic Unilat L  09/09/2014   CLINICAL DATA:  Callback from screening mammogram for possible mass left breast  EXAM: DIGITAL DIAGNOSTIC LEFT MAMMOGRAM WITH CAD  ULTRASOUND LEFT BREAST  COMPARISON:  August 15, 2014, May 15, 2013, April 21, 2012  ACR Breast Density Category b: There are scattered areas of fibroglandular density.  FINDINGS: Left MLO view, spot compression left CC and MLO views are submitted. There is a persistent spiculated mass in the upper-outer quadrant left breast.  Mammographic images were processed with CAD.  Targeted ultrasound is performed, showing 0.54 x 0.74 x 0.59 cm spiculated hypoechoic lesion at the left breast 1:30 o'clock 13 cm from nipple correlating to the mammographic finding. Ultrasound of the left axilla is negative.  IMPRESSION: Highly suspicious findings.  RECOMMENDATION: Ultrasound-guided core biopsy left breast mass.  I have discussed the findings and recommendations with the patient. Results were also provided in writing at the conclusion of the visit. If applicable, a reminder letter will be sent to the patient regarding the next appointment.  BI-RADS CATEGORY  5: Highly suggestive of malignancy.   Electronically Signed   By: Abelardo Diesel M.D.   On: 09/09/2014 10:31   Mm Radiologist Eval And Mgmt  09/11/2014   EXAM: ESTABLISHED PATIENT OFFICE VISIT - LEVEL II  CHIEF COMPLAINT: Status post ultrasound-guided core biopsy of mass in the 130 o'clock location of the left breast.  HISTORY OF PRESENT ILLNESS: Since the biopsy, the patient has done well. She reports no significant bleeding or bruising.  EXAM: Biopsy site is almost healed. There is no visible ecchymosis or edema. I palpate no hematoma.  PATHOLOGY: Invasive ductal carcinoma, concordant with imaging.  ASSESSMENT AND PLAN: ASSESSMENT AND PLAN The patient and I discussed the pathology findings. She is scheduled for Multidisciplinary Clinic on 09/18/2014. MRI can be arranged if needed.  Questions were answered. Patient's given Scientist, clinical (histocompatibility and immunogenetics). I spent approximately 20 min with the patient discussing the treatment plan.   Electronically Signed   By: Shon Hale M.D.   On: 09/11/2014 13:48   US Breast Ltd Uni Left Inc Axilla  09/09/2014   CLINICAL DATA:  Callback from screening mammogram for possible mass left breast  EXAM: DIGITAL DIAGNOSTIC LEFT MAMMOGRAM WITH CAD  ULTRASOUND LEFT BREAST  COMPARISON:  August 15, 2014, May 15, 2013, April 21, 2012  ACR Breast Density Category b: There are scattered areas of fibroglandular density.  FINDINGS: Left MLO view, spot compression left CC and MLO views are submitted. There is a persistent spiculated mass in the upper-outer quadrant left breast.  Mammographic images were processed with CAD.  Targeted ultrasound is performed, showing 0.54 x 0.74 x 0.59 cm spiculated hypoechoic lesion at the left breast 1:30 o'clock 13 cm from nipple correlating to the mammographic finding. Ultrasound of the left axilla is negative.  IMPRESSION: Highly suspicious findings.  RECOMMENDATION: Ultrasound-guided core biopsy left breast mass.  I have discussed the findings and recommendations with the patient. Results were also provided in writing at the conclusion of the visit. If applicable, a reminder letter will be sent to the patient regarding the next appointment.  BI-RADS CATEGORY  5: Highly suggestive of malignancy.   Electronically Signed   By: Abelardo Diesel M.D.   On: 09/09/2014 10:31   [place] Lt Breast Bx W Loc Dev 1st Lesion Img Bx Spec US Guide  09/09/2014   CLINICAL DATA:  Suspicious mass left breast for biopsy  EXAM: ULTRASOUND GUIDED LEFT BREAST CORE NEEDLE BIOPSY  COMPARISON:  Previous exam(s).  FINDINGS: I met with the patient and we discussed the procedure of ultrasound-guided biopsy, including benefits and alternatives. We discussed the high likelihood of a successful procedure. We discussed the risks of the procedure, including infection, bleeding,  tissue injury, clip migration, and inadequate sampling. Informed written consent was given. The usual time-out protocol was performed immediately prior to the procedure.  Using sterile technique and 2% Lidocaine as local anesthetic, under direct ultrasound visualization, a 14 gauge spring-loaded device was used to perform biopsy of hypoechoic mass at left breast 1:30 o'clock using a lateral approach. At the conclusion of the procedure a coil tissue marker clip was deployed into the biopsy cavity. Follow up 2 view mammogram was performed and dictated separately.  IMPRESSION: Ultrasound guided biopsy of left breast.  No apparent complications.   Electronically Signed   By: Abelardo Diesel M.D.   On: 09/09/2014 10:40      IMPRESSION/PLAN: She has been discussed at our multidisciplinary tumor board. The consensus is that she be a good candidate for breast conservation. I talked to her about the option of a mastectomy and informed her that her expected overall survival would be equivalent between mastectomy and breast conservation, based upon randomized controlled data. She is appears enthusiastic about breast conservation.  Plan is for lumpectomy and SLN biopsy, mammaprint testing, chemotherapy if mammaprint results favor this, and then adjuvant radiotherapy.   Genetics referral (family history of multiple pancreatic cancers).  It was a pleasure meeting the patient today. We discussed the risks, benefits, and side effects of radiotherapy. We discussed that radiation would take approximately 6-7 weeks to complete and that I would give the patient a few weeks to heal following surgery before starting treatment planning. If she received chemotherapy, this would precede radiotherapy. We spoke about acute effects including skin irritation and fatigue as well as much less common late effects including lung and heart irritation. We spoke about the latest technology that is used to minimize the risk of late effects for  breast cancer patients undergoing radiotherapy. No guarantees of treatment were given. The patient is enthusiastic about proceeding with treatment. I look forward to participating in the patient's care.   __________________________________________   Eppie Gibson, MD

## 2014-09-18 NOTE — Progress Notes (Signed)
Checked in new pt with no financial concerns prior to seeing the dr. Informed pt if chemo is part of her treatment I will call her ins to see if Josem Kaufmann is req and will obtain it if it is as well as contact foundations that offer copay assistance for chemo if needed. She has my card to for any billing questions or concerns.

## 2014-09-18 NOTE — Progress Notes (Signed)
Dominique Dillon  Telephone:([phone]) [phone] Fax:(336) [phone]     ID: Dominique Dillon DOB: 04/29/1956  MR#: [id number]  RSW#:[id number]  Patient Care Team: Dominique Friday, FNP as PCP - General (Internal Medicine) Dominique Pel, MD as Referring Physician (Internal Medicine) Dominique Schools, MD as Consulting Physician (Obstetrics and Gynecology) Dominique Bookbinder, MD as Consulting Physician (General Surgery) Dominique Cruel, MD as Consulting Physician (Oncology) Dominique Gibson, MD as Consulting Physician (Radiation Oncology) Dominique Culver, RN as Registered Nurse [place], RN as Registered Nurse OTHER MD:  CHIEF COMPLAINT: Early stage breast cancer  CURRENT TREATMENT: Awaiting definitive surgery   BREAST CANCER HISTORY: Dominique Dillon had bilateral screening mammography at [facility] 08/15/2014 showing a possible mass in the left breast. Left diagnostic mammography and ultrason[facility] showed the breast density to be category B. There was a persistent spiculated mass in the upper outer quadrant of the left breast, which by ultrasonography measured 0.74 cm. Ultrasound of the left axilla was negative. Biopsy of the left breast mass in question 09/09/2014 showed ([id number]) and invasive ductal carcinoma, grade 2 or 3, E-cadherin positive, estrogen receptor 11% positive with weak staining intensity, progesterone receptor negative, HER-2 negative, with an MIB-1 of 35%.  The patient's subsequent history is as detailed below  INTERVAL HISTORY: Dominique Dillon was evaluated in the multidisciplinary breast cancer clinic 09/18/2014 accompanied by [name]. Her case was also presented at the multidisciplinary breast cancer conference the same day. At that conference it was felt the patient would be a good candidate for breast conservation and sentinel lymph node sampling and also that she warranted genetics testing. Mammaprint was  suggested to help determine whether the patient would benefit sufficiently from chemotherapy to warrant it.  REVIEW OF SYSTEMS: There were no specific symptoms leading to the original mammogram, which was routinely scheduled. The patient denies unusual headaches, visual changes, nausea, vomiting, stiff neck, dizziness, or gait imbalance. There has been no cough, phlegm production, or pleurisy, no chest pain or pressure, and no change in bowel or bladder habits. The patient denies fever, rash, bleeding, unexplained fatigue or unexplained weight loss. Dominique Dillon does complain of some aches and pains here in there, which are not more intense or persistent or focal than usual. She sleeps on 2 pillows. She has a hiatal hernia. A detailed review of systems was otherwise entirely negative.  PAST MEDICAL HISTORY: Past Medical History  Diagnosis Date  . GERD (gastroesophageal reflux disease)   . Hypertension   . Hyperlipidemia   . Obesity   . Diabetes mellitus without complication   . Breast cancer of upper-outer quadrant of left female breast 09/12/2014  . Arthritis     PAST SURGICAL HISTORY: Past Surgical History  Procedure Laterality Date  . C section x 2    . Explorartory lap    . Bilatreral knee surgery    . Abdominal hysterectomy    . Cholecystectomy    . Abdominal surgery    . Appendectomy    . Tonsillectomy    . Irrigation and debridement abscess N/A 03/08/2014    Procedure: IRRIGATION AND DEBRIDEMENT ABDOMINAL WALL ABSCESS;  Surgeon: Adin Hector, MD;  Location: [facility];  Service: General;  Laterality: N/A;    FAMILY HISTORY Family History  Problem Relation Age of Onset  . Asthma Other   . Cancer Other   . Hyperlipidemia Other   . Heart disease Other   . Hypertension Other   . Stroke Other   .  Prostate cancer Father   . Stomach cancer Maternal Aunt   . Pancreatic cancer Maternal Uncle   . Lung cancer Maternal Grandmother   . Breast cancer Maternal Aunt   . Pancreatic cancer  Maternal Aunt   . Pancreatic cancer Maternal Uncle    there is significant stomach (one case) and pancreatic cancer (3 cases) on the maternal side. There is also a history of breast cancer on the maternal side(1 and diagnosed at age 47). The patient's father died the age of 18 with prostate cancer  GYNECOLOGIC HISTORY:  No LMP recorded. Patient has had a hysterectomy. Menarche age 46, first live birth age 53, the patient is G X P2. She underwent hysterectomy with salpingo-oophorectomy in 1995. She did not take hormone replacement.  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 She is widowed. At home she lives with her adopted daughter Dominique Dillon. The patient's 2 biologic daughter are Dominique Dillon who works as a Recruitment consultant and Dominique Dillon who works as a Land, both in [place]. The patient also has a goddaughter, Dominique Dillon, whom she help raise and whom she considers as a daughter    ADVANCED DIRECTIVES: Not in place. At her to 05/29/2015 visit the patient was given the appropriate documents 2 complete and notarize at her discretion. She tells me she intends to name her daughter Dominique Dillon as her healthcare power of attorney. 68 can be reached at [place]: History  Substance Use Topics  . Smoking status: Never Smoker   . Smokeless tobacco: Never Used  . Alcohol Use: Yes     Comment: occasional     Colonoscopy: [facility], 2011  PAP:  Bone density: Remote  Lipid panel:  Allergies  Allergen Reactions  . Biaxin [Clarithromycin]     Makes her feel blah    Current Outpatient Prescriptions  Medication Sig Dispense Refill  . aspirin 81 MG tablet Take 81 mg by mouth daily.    . calcium elemental as carbonate (CALCIUM ANTACID ULTRA) 400 MG tablet Chew 1,000 mg by mouth 3 (three) times daily.    . cetirizine (ZYRTEC) 10 MG chewable tablet Chew 10 mg by mouth daily.    . Cholecalciferol (VITAMIN D) 1000 UNITS capsule Take 1,000  Units by mouth daily.    . DULoxetine (CYMBALTA) 60 MG capsule Take 60 mg by mouth once a week. Patient has been tapering off of Cymbalta and is currently on her last week of "once weekly."  As of 03/09/14, patient reports that she no longer needs further doses.    . ferrous sulfate 325 (65 FE) MG tablet Take 325 mg by mouth daily with breakfast.    . hydrochlorothiazide (HYDRODIURIL) 25 MG tablet Take 25 mg by mouth every other day.     . losartan (COZAAR) 50 MG tablet Take 50 mg by mouth daily.    . metFORMIN (GLUCOPHAGE) 1000 MG tablet Take 1,000 mg by mouth 2 (two) times daily with a meal.    . NOVOLOG MIX 70/30 FLEXPEN (70-30) 100 UNIT/ML Pen Inject 50 Units into the skin 2 (two) times daily.     Marland Kitchen omega-3 acid ethyl esters (LOVAZA) 1 G capsule Take 1 g by mouth daily.    Marland Kitchen oxyCODONE (OXY IR/ROXICODONE) 5 MG immediate release tablet Take 1 tablet (5 mg total) by mouth every 4 (four) hours as needed for moderate pain, severe pain or breakthrough pain. 20 tablet 0  . pantoprazole (PROTONIX)  40 MG tablet Take 40 mg by mouth daily.    . Pitavastatin Calcium (LIVALO) 4 MG TABS Take 4 mg by mouth every other day.     No current facility-administered medications for this visit.    OBJECTIVE: Middle-aged African-American woman who appears stated age 68 Vitals:   09/18/14 1214  BP: 136/68  Pulse: 89  Temp: 98.5 F (36.9 C)  Resp: 18     Body mass index is 36.31 kg/(m^2).    ECOG FS:0 - Asymptomatic  Ocular: Sclerae unicteric, pupils round and equal Ear-nose-throat: Oropharynx clear and moist Lymphatic: No cervical or supraclavicular adenopathy Lungs no rales or rhonchi, good excursion bilaterally Heart regular rate and rhythm, no murmur appreciated Abd soft, nontender, positive bowel sounds MSK no focal spinal tenderness, no joint edema Neuro: non-focal, well-oriented, appropriate affect Breasts: The right breast is unremarkable. The left breast is status post recent biopsy. I do not  palpate a well-defined mass. There are no skin or nipple changes of concern. The right axilla is benign.   LAB RESULTS:  CMP     Component Value Date/Time   NA 140 09/18/2014 1155   NA 142 03/11/2014 0504   K 3.8 09/18/2014 1155   K 3.6* 03/11/2014 0504   CL 105 03/11/2014 0504   CO2 24 09/18/2014 1155   CO2 26 03/11/2014 0504   GLUCOSE 248* 09/18/2014 1155   GLUCOSE 77 03/11/2014 0504   BUN 11.9 09/18/2014 1155   BUN 8 03/11/2014 0504   CREATININE 0.9 09/18/2014 1155   CREATININE 0.72 03/11/2014 0504   CALCIUM 9.6 09/18/2014 1155   CALCIUM 9.1 03/11/2014 0504   PROT 7.5 09/18/2014 1155   PROT 7.1 03/08/2014 0500   ALBUMIN 4.0 09/18/2014 1155   ALBUMIN 3.1* 03/08/2014 0500   AST 16 09/18/2014 1155   AST 12 03/08/2014 0500   ALT 12 09/18/2014 1155   ALT 8 03/08/2014 0500   ALKPHOS 99 09/18/2014 1155   ALKPHOS 90 03/08/2014 0500   BILITOT 0.66 09/18/2014 1155   BILITOT 0.6 03/08/2014 0500   GFRNONAA >90 03/11/2014 0504   GFRAA >90 03/11/2014 0504    INo results found for: SPEP, UPEP  Lab Results  Component Value Date   WBC 6.8 09/18/2014   NEUTROABS 4.0 09/18/2014   HGB 11.8 09/18/2014   HCT 37.6 09/18/2014   MCV 73.5* 09/18/2014   PLT 347 09/18/2014      Chemistry      Component Value Date/Time   NA 140 09/18/2014 1155   NA 142 03/11/2014 0504   K 3.8 09/18/2014 1155   K 3.6* 03/11/2014 0504   CL 105 03/11/2014 0504   CO2 24 09/18/2014 1155   CO2 26 03/11/2014 0504   BUN 11.9 09/18/2014 1155   BUN 8 03/11/2014 0504   CREATININE 0.9 09/18/2014 1155   CREATININE 0.72 03/11/2014 0504      Component Value Date/Time   CALCIUM 9.6 09/18/2014 1155   CALCIUM 9.1 03/11/2014 0504   ALKPHOS 99 09/18/2014 1155   ALKPHOS 90 03/08/2014 0500   AST 16 09/18/2014 1155   AST 12 03/08/2014 0500   ALT 12 09/18/2014 1155   ALT 8 03/08/2014 0500   BILITOT 0.66 09/18/2014 1155   BILITOT 0.6 03/08/2014 0500       No results found for: LABCA2  No components  found for: LABCA125  No results for input(s): INR in the last 168 hours.  Urinalysis    Component Value Date/Time   COLORURINE YELLOW 01/14/2007  Bruno 01/14/2007 0708   LABSPEC 1.017 01/14/2007 0708   PHURINE 6.0 01/14/2007 0708   GLUCOSEU NEGATIVE 01/14/2007 0708   HGBUR NEGATIVE 01/14/2007 0708   BILIRUBINUR NEGATIVE 01/14/2007 0708   KETONESUR NEGATIVE 01/14/2007 0708   PROTEINUR NEGATIVE 01/14/2007 0708   UROBILINOGEN 0.2 01/14/2007 0708   NITRITE NEGATIVE 01/14/2007 0708   LEUKOCYTESUR SMALL* 01/14/2007 0708    STUDIES: Mm Digital Diagnostic Unilat L  09/09/2014   CLINICAL DATA:  Status post ultrasound-guided core biopsy left breast mass  EXAM: DIAGNOSTIC LEFT MAMMOGRAM POST ULTRASOUND BIOPSY  COMPARISON:  Previous exam(s).  FINDINGS: Mammographic images were obtained following left breast ultrasound guided biopsy of hypoechoic lesion at the left breast 1:30 o'clock. Cc and lateral views of the left breast demonstrate a coil biopsy clip is 5 mm from the mass of concern.  IMPRESSION: Post biopsy clip mammogram as described.  Final Assessment: Post Procedure Mammograms for Marker Placement   Electronically Signed   By: Abelardo Diesel M.D.   On: 09/09/2014 10:36   Mm Digital Diagnostic Unilat L  09/09/2014   CLINICAL DATA:  Callback from screening mammogram for possible mass left breast  EXAM: DIGITAL DIAGNOSTIC LEFT MAMMOGRAM WITH CAD  ULTRASOUND LEFT BREAST  COMPARISON:  August 15, 2014, May 15, 2013, April 21, 2012  ACR Breast Density Category b: There are scattered areas of fibroglandular density.  FINDINGS: Left MLO view, spot compression left CC and MLO views are submitted. There is a persistent spiculated mass in the upper-outer quadrant left breast.  Mammographic images were processed with CAD.  Targeted ultrasound is performed, showing 0.54 x 0.74 x 0.59 cm spiculated hypoechoic lesion at the left breast 1:30 o'clock 13 cm from nipple correlating to the  mammographic finding. Ultrasound of the left axilla is negative.  IMPRESSION: Highly suspicious findings.  RECOMMENDATION: Ultrasound-guided core biopsy left breast mass.  I have discussed the findings and recommendations with the patient. Results were also provided in writing at the conclusion of the visit. If applicable, a reminder letter will be sent to the patient regarding the next appointment.  BI-RADS CATEGORY  5: Highly suggestive of malignancy.   Electronically Signed   By: Abelardo Diesel M.D.   On: 09/09/2014 10:31   Mm Radiologist Eval And Mgmt  09/11/2014   EXAM: ESTABLISHED PATIENT OFFICE VISIT - LEVEL II  CHIEF COMPLAINT: Status post ultrasound-guided core biopsy of mass in the 130 o'clock location of the left breast.  HISTORY OF PRESENT ILLNESS: Since the biopsy, the patient has done well. She reports no significant bleeding or bruising.  EXAM: Biopsy site is almost healed. There is no visible ecchymosis or edema. I palpate no hematoma.  PATHOLOGY: Invasive ductal carcinoma, concordant with imaging.  ASSESSMENT AND PLAN: ASSESSMENT AND PLAN The patient and I discussed the pathology findings. She is scheduled for Multidisciplinary Clinic on 09/18/2014. MRI can be arranged if needed. Questions were answered. Patient's given Scientist, clinical (histocompatibility and immunogenetics). I spent approximately 20 min with the patient discussing the treatment plan.   Electronically Signed   By: Shon Hale M.D.   On: 09/11/2014 13:48   US Breast Ltd Uni Left Inc Axilla  09/09/2014   CLINICAL DATA:  Callback from screening mammogram for possible mass left breast  EXAM: DIGITAL DIAGNOSTIC LEFT MAMMOGRAM WITH CAD  ULTRASOUND LEFT BREAST  COMPARISON:  August 15, 2014, May 15, 2013, April 21, 2012  ACR Breast Density Category b: There are scattered areas of fibroglandular density.  FINDINGS: Left MLO  view, spot compression left CC and MLO views are submitted. There is a persistent spiculated mass in the upper-outer quadrant left breast.   Mammographic images were processed with CAD.  Targeted ultrasound is performed, showing 0.54 x 0.74 x 0.59 cm spiculated hypoechoic lesion at the left breast 1:30 o'clock 13 cm from nipple correlating to the mammographic finding. Ultrasound of the left axilla is negative.  IMPRESSION: Highly suspicious findings.  RECOMMENDATION: Ultrasound-guided core biopsy left breast mass.  I have discussed the findings and recommendations with the patient. Results were also provided in writing at the conclusion of the visit. If applicable, a reminder letter will be sent to the patient regarding the next appointment.  BI-RADS CATEGORY  5: Highly suggestive of malignancy.   Electronically Signed   By: Abelardo Diesel M.D.   On: 09/09/2014 10:31   [place] Lt Breast Bx W Loc Dev 1st Lesion Img Bx Spec US Guide  09/09/2014   CLINICAL DATA:  Suspicious mass left breast for biopsy  EXAM: ULTRASOUND GUIDED LEFT BREAST CORE NEEDLE BIOPSY  COMPARISON:  Previous exam(s).  FINDINGS: I met with the patient and we discussed the procedure of ultrasound-guided biopsy, including benefits and alternatives. We discussed the high likelihood of a successful procedure. We discussed the risks of the procedure, including infection, bleeding, tissue injury, clip migration, and inadequate sampling. Informed written consent was given. The usual time-out protocol was performed immediately prior to the procedure.  Using sterile technique and 2% Lidocaine as local anesthetic, under direct ultrasound visualization, a 14 gauge spring-loaded device was used to perform biopsy of hypoechoic mass at left breast 1:30 o'clock using a lateral approach. At the conclusion of the procedure a coil tissue marker clip was deployed into the biopsy cavity. Follow up 2 view mammogram was performed and dictated separately.  IMPRESSION: Ultrasound guided biopsy of left breast.  No apparent complications.   Electronically Signed   By: Abelardo Diesel M.D.   On: 09/09/2014 10:40     ASSESSMENT: 59 y.o. [place] woman status post left breast upper outer quadrant biopsy 09/09/2014 for a clinical T1b N0, stage IA invasive ductal carcinoma, grade 2 or 3, weakly estrogen receptor positive, progesterone receptor and HER-2 negative, with an MIB-1 of 35%  (1) breast conserving surgery with sentinel lymph node sampling planned as a first  (2) genetics testing pending  (3) Mammaprint will be obtained postop to help decide the chemotherapy question  PLAN:  We spent the better part of today's hour-long appointment discussing the biology of breast cancer in general, and the specifics of the patient's tumor in particular. The patient understands she has a small clinically node negative tumor which however appears aggressive and is only minimally estrogen receptor positive. The benefit of anti-estrogens is questionable, although certainly she should receive them even if only part of the tumor is estrogen receptor positive.  She understands that as far as systemic therapy is concerned, the only other choice for her is chemotherapy. The benefit of chemotherapy is likely to be marginal, however,and therefore we are going to request a Mammaprint to help [place] with the chemotherapy decision. If it comes back "low risk", then certainly we would avoid chemotherapy. If it comes back "high risk", we will discuss chemotherapy as her main systemic treatment option and she may be a candidate for 4 cycles of cyclophosphamide and docetaxel.  Because of her family history, she will have genetics testing both for her surgery and that means her surgery will be delayed by about  2-3 weeks. Accordingly she will see me again in about 6 weeks.  The patient has a good understanding of the overall plan. She agrees with it. She knows the goal of treatment in her case is cure. She will call with any problems that may develop before her next visit here.  Dominique Cruel, MD   09/18/2014 3:32 PM Medical Oncology  and Hematology [facility] [address] [place], [place] [address] Tel. [phone]    Fax. [phone]

## 2014-09-18 NOTE — Therapy (Signed)
Dominique Dillon, [place], [address] Phone: [phone]   Fax:  [phone]  Physical Therapy Evaluation  Patient Details  Name: Dominique Dillon MRN: [id number] Date of Birth: 1956-04-11 Referring Provider:  Rolm Bookbinder, MD  Encounter Date: 09/18/2014      PT End of Session - 09/18/14 1445    Visit Number 1   Number of Visits 1   PT Start Time 1325   PT Stop Time 1355   PT Time Calculation (min) 30 min   Activity Tolerance Patient tolerated treatment well   Behavior During Therapy [name] for tasks assessed/performed      Past Medical History  Diagnosis Date  . GERD (gastroesophageal reflux disease)   . Hypertension   . Hyperlipidemia   . Obesity   . Diabetes mellitus without complication   . Breast cancer of upper-outer quadrant of left female breast 09/12/2014  . Arthritis     Past Surgical History  Procedure Laterality Date  . C section x 2    . Explorartory lap    . Bilatreral knee surgery    . Abdominal hysterectomy    . Cholecystectomy    . Abdominal surgery    . Appendectomy    . Tonsillectomy    . Irrigation and debridement abscess N/A 03/08/2014    Procedure: IRRIGATION AND DEBRIDEMENT ABDOMINAL WALL ABSCESS;  Surgeon: Adin Hector, MD;  Location: [facility];  Service: General;  Laterality: N/A;    There were no vitals taken for this visit.  Visit Diagnosis:  Right shoulder pain  Abnormal posture  Breast cancer, female, left      Subjective Assessment - 09/18/14 1433    Symptoms Patient is being seen today for a baseline assessment related to her new diagnosis of left breast cancer.   Pertinent History Patient was diagnosed on 09/09/14 with left ER positive, PR negative, HER2 negative breast cancer with a Ki67 of 35%.  Her mass is 5 mm in size and is Grade 3 invasive ductal carcinome.   Patient Stated Goals Reduce lymphedema risk and learn a post op shoulder ROM HEP.   Currently  in Pain? Yes   Pain Score 6    Pain Location Shoulder   Pain Orientation Right   Pain Descriptors / Indicators Sharp   Pain Type Chronic pain   Pain Onset More than a month ago   Pain Frequency Intermittent   Aggravating Factors  lifting   Pain Relieving Factors rest   Effect of Pain on Daily Activities Limits her ability to work   Multiple Pain Sites No          [facility] PT Assessment - 09/18/14 0001    Assessment   Medical Diagnosis Left breast cancer   Onset Date 09/09/14   Precautions   Precautions Other (comment)  Active breast cancer, diabetes   Balance Screen   Has the patient fallen in the past 6 months No   Has the patient had a decrease in activity level because of a fear of falling?  No   Is the patient reluctant to leave their home because of a fear of falling?  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   Vocation Requirements Caring for infants   Leisure She  does not exercise   Cognition   Overall Cognitive Status Within Functional Limits for tasks assessed   Posture/Postural Control   Posture/Postural Control Postural limitations   Postural Limitations Rounded Shoulders;Forward head   AROM   Right Shoulder Extension 54 Degrees   Right Shoulder Flexion 137 Degrees   Right Shoulder ABduction 127 Degrees  and painful   Right Shoulder Internal Rotation 60 Degrees   Right Shoulder External Rotation 50 Degrees   Left Shoulder Extension 60 Degrees   Left Shoulder Flexion 141 Degrees   Left Shoulder ABduction 140 Degrees   Left Shoulder Internal Rotation 77 Degrees   Left Shoulder External Rotation 73 Degrees   Strength   Overall Strength Within functional limits for tasks performed           LYMPHEDEMA/ONCOLOGY QUESTIONNAIRE - 09/18/14 1443    Type    Cancer Type Left breast   Lymphedema Assessments   Lymphedema Assessments Upper extremities   Right Upper Extremity Lymphedema   10 cm Proximal to Olecranon Process 30.5 cm   Olecranon Process 25.8 cm   10 cm Proximal to Ulnar Styloid Process 21.6 cm   Just Proximal to Ulnar Styloid Process 16 cm   Across Hand at [name] 19.8 cm   At [name] of 2nd Digit 6.5 cm   Left Upper Extremity Lymphedema   10 cm Proximal to Olecranon Process 30 cm   Olecranon Process 25.9 cm   10 cm Proximal to Ulnar Styloid Process 22 cm   Just Proximal to Ulnar Styloid Process 16.5 cm   Across Hand at [name] 20.3 cm   At [place] of 2nd Digit 6.7 cm           PT Education - 09/18/14 1445    Education provided Yes   Education Details Post op shoulder ROM HEP and lymphedema risk reduction   Person(s) Educated Patient;Child(ren)   Methods Explanation;Demonstration;Handout   Comprehension Verbalized understanding       Patient was instructed today in a home exercise program today for post op shoulder range of motion. These included active assist shoulder flexion in sitting, scapular retraction, wall walking with shoulder abduction, and hands behind head external rotation.  She was encouraged to do these twice a day, holding 3 seconds and repeating 5 times when permitted by her physician.           Breast Clinic Goals - 09/18/14 1456    Patient will be able to verbalize understanding of pertinent lymphedema risk reduction practices relevant to her diagnosis specifically related to skin care.   Time 1   Period Days   Status Achieved   Patient will be able to return demonstrate and/or verbalize understanding of the post-op home exercise program related to regaining shoulder range of motion.   Time 1   Period Days   Status Achieved   Patient will be able to verbalize understanding of the importance of attending the postoperative After Breast Cancer Class for further lymphedema risk  reduction education and therapeutic exercise.   Time 1   Period Days   Status Achieved              Plan - 09/18/14 1445    Clinical Impression Statement Paitent is a 59 year old woman recently diagnosed with ER positive, PR negative breast cancer.  She is planning to have a left lumpectomy and sentinel node biopsy followed by Mammoprint testing to determine the need for chemotherapy.  She will then undergo  radiation treatment.  She may benefit from post op PT.  She has a painful right shoulder from a previous work injury that has not improved with 8 weeks of physical therapy.  Her ROM continues to be painful and limited.  It would be beneficial to try more therapy for the right shoulder at the time of rehabing her left following surgery.   Pt will benefit from skilled therapeutic intervention in order to improve on the following deficits Decreased range of motion;Increased edema;Decreased knowledge of precautions;Impaired UE functional use;Decreased strength   Rehab Potential Good   Clinical Impairments Affecting Rehab Potential none   PT Frequency One time visit   PT Treatment/Interventions Patient/family education;Therapeutic exercise   Consulted and Agree with Plan of Care Patient       Patient will follow up at outpatient cancer rehab if needed following surgery.  If the patient requires physical therapy at that time, a specific plan will be dictated and sent to the referring physician for approval. The patient was educated today on appropriate basic range of motion exercises to begin post operatively and the importance of attending the After Breast Cancer class following surgery.  Patient was educated today on lymphedema risk reduction practices as it pertains to recommendations that will benefit the patient immediately following surgery.  She verbalized good understanding.  No additional physical therapy is indicated at this time.      Problem List Patient Active Problem List    Diagnosis Date Noted  . Breast cancer of upper-outer quadrant of left female breast 09/12/2014  . Abscess of abdominal wall s/p I&D 7/30- & 03/08/2014 03/08/2014  . Cellulitis 03/08/2014  . Diabetes mellitus 03/08/2014  . Essential hypertension, benign 03/08/2014  . GERD (gastroesophageal reflux disease) 03/08/2014  . Anemia 03/08/2014    Annia Friendly, PT 09/18/2014, 2:59 PM  [place], [place], [address] Phone: [phone]   Fax:  [phone]

## 2014-09-18 NOTE — Progress Notes (Signed)
[name] is a very pleasant 58 y.o. female from [place], [place] with newly diagnosed grade 3 invasive ductal carcinoma of the left breast.  Biopsy results revealed the tumor's hormone status as ER positive (weak staining at 11%), PR negative, and HER2/neu pending at this time. Ki67 is 35%.  She presents today with her daughters to the [facility] ([facility]) for treatment consideration and recommendations from the breast surgeon, radiation oncologist, and medical oncologist.     I briefly met with [name] and her family during her [facility] visit today. We discussed the purpose of the Survivorship Clinic, which will include monitoring for recurrence, coordinating completion of age and gender-appropriate cancer screenings, promotion of overall wellness, as well as managing potential late/long-term side effects of anti-cancer treatments.    As of today, the treatment plan for [name] will likely include surgery, chemotherapy, and radiation therapy.  She will meet with the Genetics Counselor due to her family history of breast cancer.  The intent of treatment for [name] is cure, therefore she will be eligible for the Survivorship Clinic upon her completion of treatment.  Her survivorship care plan (SCP) document will be drafted and updated throughout the course of her treatment trajectory. She will receive the SCP in an office visit with myself in the Survivorship Clinic once she has completed treatment.   [name] was encouraged to ask questions and all questions were answered to her satisfaction.  She was given my business card and encouraged to contact me with any concerns regarding survivorship.  I look forward to participating in her care.   Gretchen Dawson, NP Survivorship Program [facility] [phone] 

## 2014-09-19 ENCOUNTER — Other Ambulatory Visit (INDEPENDENT_AMBULATORY_CARE_PROVIDER_SITE_OTHER): Payer: Self-pay | Admitting: General Surgery

## 2014-09-20 ENCOUNTER — Other Ambulatory Visit: Payer: Self-pay | Admitting: General Surgery

## 2014-09-20 ENCOUNTER — Encounter: Payer: Self-pay | Admitting: General Practice

## 2014-09-20 ENCOUNTER — Telehealth: Payer: Self-pay | Admitting: Oncology

## 2014-09-20 DIAGNOSIS — C50912 Malignant neoplasm of unspecified site of left female breast: Secondary | ICD-10-CM

## 2014-09-20 NOTE — Progress Notes (Signed)
[facility] Psychosocial Distress Screening Spiritual Care  Visited with ms Ramone in breast clinic to introduce [facility] team/resources, and to review distress screen per protocol.  The patient scored a 10 on the Psychosocial Distress Thermometer which indicates severe distress. Also assessed for distress and other psychosocial needs.   ONCBCN DISTRESS SCREENING 09/20/2014  Screening Type Initial Screening  Distress experienced in past week (1-10) 10  Family Problem type Other (comment)  Referral to support programs Yes  Other Spiritual Care   Per pt, clinic and team were very helpful in increasing her information and understanding of her dx/tx, reducing her distress to a 5.  She notes that her top stressors are "feeling sick at hearing I have cancer" and the resulting "Why me?" questions.  She was pleased and relieved to learn of support resources and welcomes a referral for an [employer].    Follow up needed: No.  Will make [facility] referral per pt's permission.  Pt also has print resources, including contact info, for support.  Please also page as needs arise.  Thank you.  [place], [place]

## 2014-09-20 NOTE — Telephone Encounter (Signed)
per pof to sch pt appt-cld & left pt a message and adv of appt time & date °

## 2014-09-23 ENCOUNTER — Telehealth: Payer: Self-pay | Admitting: *Deleted

## 2014-09-23 ENCOUNTER — Other Ambulatory Visit: Payer: BC Managed Care – PPO

## 2014-09-23 ENCOUNTER — Telehealth: Payer: Self-pay | Admitting: Genetic Counselor

## 2014-09-23 ENCOUNTER — Encounter: Payer: BC Managed Care – PPO | Admitting: Genetic Counselor

## 2014-09-23 NOTE — Telephone Encounter (Signed)
Left message for a return phone call from [facility] 2/10.  Awaiting patient response.

## 2014-09-23 NOTE — Telephone Encounter (Signed)
Patient stated that she had called two different numbers to cancel the appoinment.  She is not interested in genetic testing at this time.

## 2014-09-26 ENCOUNTER — Encounter (HOSPITAL_BASED_OUTPATIENT_CLINIC_OR_DEPARTMENT_OTHER): Payer: Self-pay | Admitting: *Deleted

## 2014-09-26 NOTE — Progress Notes (Signed)
Will come in for ekg-labs done 09/18/14

## 2014-09-26 NOTE — Progress Notes (Signed)
   09/26/14 1318  OBSTRUCTIVE SLEEP APNEA  Have you ever been diagnosed with sleep apnea through a sleep study? No  Do you snore loudly (loud enough to be heard through closed doors)?  0  Do you often feel tired, fatigued, or sleepy during the daytime? 0  Has anyone observed you stop breathing during your sleep? 0  Do you have, or are you being treated for high blood pressure? 1  BMI more than 35 kg/m2? 1  Age over 60 years old? 1  Neck circumference greater than 40 cm/16 inches? 1  Obstructive Sleep Apnea Score 4  Score 4 or greater  Results sent to PCP

## 2014-09-30 ENCOUNTER — Encounter (HOSPITAL_BASED_OUTPATIENT_CLINIC_OR_DEPARTMENT_OTHER)
Admission: RE | Admit: 2014-09-30 | Discharge: 2014-09-30 | Disposition: A | Discharge status: Home / Self Care | Payer: BC Managed Care – PPO | Source: Ambulatory Visit | Attending: General Surgery | Admitting: General Surgery

## 2014-09-30 ENCOUNTER — Ambulatory Visit
Admission: RE | Admit: 2014-09-30 | Discharge: 2014-09-30 | Disposition: A | Discharge status: Home / Self Care | Payer: BC Managed Care – PPO | Source: Ambulatory Visit | Attending: General Surgery | Admitting: General Surgery

## 2014-09-30 DIAGNOSIS — E785 Hyperlipidemia, unspecified: Secondary | ICD-10-CM | POA: Diagnosis not present

## 2014-09-30 DIAGNOSIS — C50412 Malignant neoplasm of upper-outer quadrant of left female breast: Secondary | ICD-10-CM | POA: Diagnosis not present

## 2014-09-30 DIAGNOSIS — E669 Obesity, unspecified: Secondary | ICD-10-CM | POA: Diagnosis not present

## 2014-09-30 DIAGNOSIS — C50912 Malignant neoplasm of unspecified site of left female breast: Secondary | ICD-10-CM

## 2014-09-30 DIAGNOSIS — Z90710 Acquired absence of both cervix and uterus: Secondary | ICD-10-CM | POA: Diagnosis not present

## 2014-09-30 DIAGNOSIS — Z794 Long term (current) use of insulin: Secondary | ICD-10-CM | POA: Diagnosis not present

## 2014-09-30 DIAGNOSIS — Z9049 Acquired absence of other specified parts of digestive tract: Secondary | ICD-10-CM | POA: Diagnosis not present

## 2014-09-30 DIAGNOSIS — I10 Essential (primary) hypertension: Secondary | ICD-10-CM | POA: Diagnosis not present

## 2014-09-30 DIAGNOSIS — I252 Old myocardial infarction: Secondary | ICD-10-CM | POA: Diagnosis not present

## 2014-09-30 DIAGNOSIS — Z881 Allergy status to other antibiotic agents status: Secondary | ICD-10-CM | POA: Diagnosis not present

## 2014-09-30 DIAGNOSIS — E119 Type 2 diabetes mellitus without complications: Secondary | ICD-10-CM | POA: Diagnosis not present

## 2014-09-30 DIAGNOSIS — K219 Gastro-esophageal reflux disease without esophagitis: Secondary | ICD-10-CM | POA: Diagnosis not present

## 2014-09-30 DIAGNOSIS — Z888 Allergy status to other drugs, medicaments and biological substances status: Secondary | ICD-10-CM | POA: Diagnosis not present

## 2014-09-30 DIAGNOSIS — I509 Heart failure, unspecified: Secondary | ICD-10-CM | POA: Diagnosis not present

## 2014-09-30 DIAGNOSIS — Z6836 Body mass index (BMI) 36.0-36.9, adult: Secondary | ICD-10-CM | POA: Diagnosis not present

## 2014-09-30 DIAGNOSIS — Z7982 Long term (current) use of aspirin: Secondary | ICD-10-CM | POA: Diagnosis not present

## 2014-09-30 DIAGNOSIS — M1990 Unspecified osteoarthritis, unspecified site: Secondary | ICD-10-CM | POA: Diagnosis not present

## 2014-09-30 DIAGNOSIS — M5030 Other cervical disc degeneration, unspecified cervical region: Secondary | ICD-10-CM | POA: Diagnosis not present

## 2014-10-01 ENCOUNTER — Ambulatory Visit (HOSPITAL_BASED_OUTPATIENT_CLINIC_OR_DEPARTMENT_OTHER)
Admission: RE | Admit: 2014-10-01 | Discharge: 2014-10-01 | Disposition: A | Discharge status: Home / Self Care | Payer: BC Managed Care – PPO | Source: Ambulatory Visit | Attending: General Surgery | Admitting: General Surgery

## 2014-10-01 ENCOUNTER — Ambulatory Visit
Admission: RE | Admit: 2014-10-01 | Discharge: 2014-10-01 | Disposition: A | Discharge status: Home / Self Care | Payer: BC Managed Care – PPO | Source: Ambulatory Visit | Attending: General Surgery | Admitting: General Surgery

## 2014-10-01 ENCOUNTER — Encounter (HOSPITAL_BASED_OUTPATIENT_CLINIC_OR_DEPARTMENT_OTHER): Payer: Self-pay | Admitting: *Deleted

## 2014-10-01 ENCOUNTER — Ambulatory Visit (HOSPITAL_BASED_OUTPATIENT_CLINIC_OR_DEPARTMENT_OTHER): Payer: BC Managed Care – PPO | Admitting: Anesthesiology

## 2014-10-01 ENCOUNTER — Encounter (HOSPITAL_BASED_OUTPATIENT_CLINIC_OR_DEPARTMENT_OTHER)
Admission: RE | Disposition: A | Discharge status: Home / Self Care | Payer: Self-pay | Source: Ambulatory Visit | Attending: General Surgery

## 2014-10-01 ENCOUNTER — Encounter (HOSPITAL_COMMUNITY)
Admission: RE | Admit: 2014-10-01 | Discharge: 2014-10-01 | Disposition: A | Discharge status: Home / Self Care | Payer: BC Managed Care – PPO | Source: Ambulatory Visit | Attending: General Surgery | Admitting: General Surgery

## 2014-10-01 DIAGNOSIS — E119 Type 2 diabetes mellitus without complications: Secondary | ICD-10-CM | POA: Insufficient documentation

## 2014-10-01 DIAGNOSIS — I509 Heart failure, unspecified: Secondary | ICD-10-CM | POA: Insufficient documentation

## 2014-10-01 DIAGNOSIS — E669 Obesity, unspecified: Secondary | ICD-10-CM | POA: Insufficient documentation

## 2014-10-01 DIAGNOSIS — Z90710 Acquired absence of both cervix and uterus: Secondary | ICD-10-CM | POA: Insufficient documentation

## 2014-10-01 DIAGNOSIS — I10 Essential (primary) hypertension: Secondary | ICD-10-CM | POA: Insufficient documentation

## 2014-10-01 DIAGNOSIS — K219 Gastro-esophageal reflux disease without esophagitis: Secondary | ICD-10-CM | POA: Insufficient documentation

## 2014-10-01 DIAGNOSIS — Z888 Allergy status to other drugs, medicaments and biological substances status: Secondary | ICD-10-CM | POA: Insufficient documentation

## 2014-10-01 DIAGNOSIS — E785 Hyperlipidemia, unspecified: Secondary | ICD-10-CM | POA: Insufficient documentation

## 2014-10-01 DIAGNOSIS — Z7982 Long term (current) use of aspirin: Secondary | ICD-10-CM | POA: Insufficient documentation

## 2014-10-01 DIAGNOSIS — M5030 Other cervical disc degeneration, unspecified cervical region: Secondary | ICD-10-CM | POA: Insufficient documentation

## 2014-10-01 DIAGNOSIS — C50412 Malignant neoplasm of upper-outer quadrant of left female breast: Secondary | ICD-10-CM

## 2014-10-01 DIAGNOSIS — Z881 Allergy status to other antibiotic agents status: Secondary | ICD-10-CM | POA: Insufficient documentation

## 2014-10-01 DIAGNOSIS — Z6836 Body mass index (BMI) 36.0-36.9, adult: Secondary | ICD-10-CM | POA: Insufficient documentation

## 2014-10-01 DIAGNOSIS — Z794 Long term (current) use of insulin: Secondary | ICD-10-CM | POA: Insufficient documentation

## 2014-10-01 DIAGNOSIS — C50912 Malignant neoplasm of unspecified site of left female breast: Secondary | ICD-10-CM

## 2014-10-01 DIAGNOSIS — I252 Old myocardial infarction: Secondary | ICD-10-CM | POA: Insufficient documentation

## 2014-10-01 DIAGNOSIS — Z9049 Acquired absence of other specified parts of digestive tract: Secondary | ICD-10-CM | POA: Insufficient documentation

## 2014-10-01 DIAGNOSIS — M1990 Unspecified osteoarthritis, unspecified site: Secondary | ICD-10-CM | POA: Insufficient documentation

## 2014-10-01 HISTORY — DX: Other cervical disc degeneration, unspecified cervical region: M50.30

## 2014-10-01 HISTORY — PX: RADIOACTIVE SEED GUIDED PARTIAL MASTECTOMY WITH AXILLARY SENTINEL LYMPH NODE BIOPSY: SHX6520

## 2014-10-01 HISTORY — PX: BREAST LUMPECTOMY: SHX2

## 2014-10-01 LAB — GLUCOSE, CAPILLARY
GLUCOSE-CAPILLARY: 157 mg/dL — AB (ref 70–99)
GLUCOSE-CAPILLARY: 153 mg/dL — AB (ref 70–99)

## 2014-10-01 SURGERY — RADIOACTIVE SEED GUIDED PARTIAL MASTECTOMY WITH AXILLARY SENTINEL LYMPH NODE BIOPSY
Anesthesia: Regional | Site: Breast | Laterality: Left

## 2014-10-01 MED ORDER — MIDAZOLAM HCL 2 MG/2ML IJ SOLN
INTRAMUSCULAR | Status: AC
  Filled 2014-10-01: qty 2

## 2014-10-01 MED ORDER — HYDROMORPHONE HCL 1 MG/ML IJ SOLN
0.2500 mg | INTRAMUSCULAR | Status: DC | PRN
  Administered 2014-10-01: 0.25 mg via INTRAVENOUS
  Administered 2014-10-01 (×2): 0.5 mg via INTRAVENOUS
  Administered 2014-10-01: 0.25 mg via INTRAVENOUS

## 2014-10-01 MED ORDER — MIDAZOLAM HCL 5 MG/5ML IJ SOLN
INTRAMUSCULAR | Status: DC | PRN
  Administered 2014-10-01: 1 mg via INTRAVENOUS

## 2014-10-01 MED ORDER — HYDROMORPHONE HCL 1 MG/ML IJ SOLN
INTRAMUSCULAR | Status: AC
  Filled 2014-10-01: qty 1

## 2014-10-01 MED ORDER — BUPIVACAINE HCL (PF) 0.25 % IJ SOLN
INTRAMUSCULAR | Status: AC
  Filled 2014-10-01: qty 30

## 2014-10-01 MED ORDER — LIDOCAINE HCL (CARDIAC) 20 MG/ML IV SOLN
INTRAVENOUS | Status: DC | PRN
  Administered 2014-10-01: 80 mg via INTRAVENOUS

## 2014-10-01 MED ORDER — OXYCODONE HCL 5 MG/5ML PO SOLN
5.0000 mg | Freq: Once | ORAL | Status: AC | PRN
Start: 2014-10-01 — End: 2014-10-01

## 2014-10-01 MED ORDER — FENTANYL CITRATE 0.05 MG/ML IJ SOLN
INTRAMUSCULAR | Status: AC
  Filled 2014-10-01: qty 2

## 2014-10-01 MED ORDER — ACETAMINOPHEN 325 MG PO TABS: 325.0000 mg | ORAL_TABLET | ORAL | Status: DC | PRN

## 2014-10-01 MED ORDER — ACETAMINOPHEN 160 MG/5ML PO SOLN: 325.0000 mg | ORAL | Status: DC | PRN

## 2014-10-01 MED ORDER — MIDAZOLAM HCL 2 MG/2ML IJ SOLN
1.0000 mg | INTRAMUSCULAR | Status: DC | PRN
Start: 2014-10-01 — End: 2014-10-01
  Administered 2014-10-01: 2 mg via INTRAVENOUS

## 2014-10-01 MED ORDER — FENTANYL CITRATE 0.05 MG/ML IJ SOLN
INTRAMUSCULAR | Status: AC
  Filled 2014-10-01: qty 4

## 2014-10-01 MED ORDER — LACTATED RINGERS IV SOLN
INTRAVENOUS | Status: DC
  Administered 2014-10-01 (×2): via INTRAVENOUS

## 2014-10-01 MED ORDER — CEFAZOLIN SODIUM-DEXTROSE 2-3 GM-% IV SOLR
2.0000 g | INTRAVENOUS | Status: AC
  Administered 2014-10-01: 2 g via INTRAVENOUS

## 2014-10-01 MED ORDER — CEFAZOLIN SODIUM-DEXTROSE 2-3 GM-% IV SOLR
INTRAVENOUS | Status: AC
  Filled 2014-10-01: qty 50

## 2014-10-01 MED ORDER — PROPOFOL 10 MG/ML IV BOLUS
INTRAVENOUS | Status: DC | PRN
  Administered 2014-10-01 (×2): 50 mg via INTRAVENOUS
  Administered 2014-10-01: 100 mg via INTRAVENOUS

## 2014-10-01 MED ORDER — METHYLENE BLUE 1 % INJ SOLN
INTRAMUSCULAR | Status: DC | PRN
  Administered 2014-10-01: 5 mL via INTRAMUSCULAR

## 2014-10-01 MED ORDER — FENTANYL CITRATE 0.05 MG/ML IJ SOLN
INTRAMUSCULAR | Status: DC | PRN
  Administered 2014-10-01: 50 ug via INTRAVENOUS

## 2014-10-01 MED ORDER — OXYCODONE HCL 5 MG PO TABS
5.0000 mg | ORAL_TABLET | Freq: Once | ORAL | Status: AC | PRN
  Administered 2014-10-01: 5 mg via ORAL

## 2014-10-01 MED ORDER — BUPIVACAINE-EPINEPHRINE (PF) 0.5% -1:200000 IJ SOLN
INTRAMUSCULAR | Status: DC | PRN
  Administered 2014-10-01: 25 mL

## 2014-10-01 MED ORDER — METHYLENE BLUE 1 % INJ SOLN
INTRAMUSCULAR | Status: AC
  Filled 2014-10-01: qty 10

## 2014-10-01 MED ORDER — OXYCODONE-ACETAMINOPHEN 10-325 MG PO TABS: 1.0000 | ORAL_TABLET | Freq: Four times a day (QID) | ORAL | Status: DC | PRN

## 2014-10-01 MED ORDER — OXYCODONE HCL 5 MG PO TABS
ORAL_TABLET | ORAL | Status: AC
  Filled 2014-10-01: qty 1

## 2014-10-01 MED ORDER — SODIUM CHLORIDE 0.9 % IJ SOLN
INTRAMUSCULAR | Status: AC
  Filled 2014-10-01: qty 10

## 2014-10-01 MED ORDER — ONDANSETRON HCL 4 MG/2ML IJ SOLN
INTRAMUSCULAR | Status: DC | PRN
  Administered 2014-10-01: 4 mg via INTRAVENOUS

## 2014-10-01 MED ORDER — FENTANYL CITRATE 0.05 MG/ML IJ SOLN
50.0000 ug | INTRAMUSCULAR | Status: DC | PRN
  Administered 2014-10-01: 100 ug via INTRAVENOUS

## 2014-10-01 SURGICAL SUPPLY — 63 items
APPLIER CLIP 9.375 MED OPEN (MISCELLANEOUS) ×2
BENZOIN TINCTURE PRP APPL 2/3 (GAUZE/BANDAGES/DRESSINGS) IMPLANT
BINDER BREAST LRG (GAUZE/BANDAGES/DRESSINGS) IMPLANT
BINDER BREAST MEDIUM (GAUZE/BANDAGES/DRESSINGS) IMPLANT
BINDER BREAST XLRG (GAUZE/BANDAGES/DRESSINGS) ×2 IMPLANT
BINDER BREAST XXLRG (GAUZE/BANDAGES/DRESSINGS) IMPLANT
BLADE SURG 15 STRL LF DISP TIS (BLADE) ×1 IMPLANT
BLADE SURG 15 STRL SS (BLADE) ×1
CANISTER SUC SOCK COL 7IN (MISCELLANEOUS) IMPLANT
CANISTER SUCT 1200ML W/VALVE (MISCELLANEOUS) IMPLANT
CHLORAPREP W/TINT 26ML (MISCELLANEOUS) ×2 IMPLANT
CLIP APPLIE 9.375 MED OPEN (MISCELLANEOUS) ×1 IMPLANT
COVER BACK TABLE 60X90IN (DRAPES) ×2 IMPLANT
COVER MAYO STAND STRL (DRAPES) ×2 IMPLANT
COVER PROBE W GEL 5X96 (DRAPES) ×2 IMPLANT
DECANTER SPIKE VIAL GLASS SM (MISCELLANEOUS) IMPLANT
DEVICE DUBIN W/COMP PLATE 8390 (MISCELLANEOUS) ×2 IMPLANT
DRAPE LAPAROSCOPIC ABDOMINAL (DRAPES) ×2 IMPLANT
DRAPE UTILITY XL STRL (DRAPES) ×2 IMPLANT
DRSG TEGADERM 4X4.75 (GAUZE/BANDAGES/DRESSINGS) IMPLANT
ELECT COATED BLADE 2.86 ST (ELECTRODE) ×2 IMPLANT
ELECT REM PT RETURN 9FT ADLT (ELECTROSURGICAL) ×2
ELECTRODE REM PT RTRN 9FT ADLT (ELECTROSURGICAL) ×1 IMPLANT
GLOVE BIO SURGEON STRL SZ 6.5 (GLOVE) ×2 IMPLANT
GLOVE BIO SURGEON STRL SZ7 (GLOVE) ×4 IMPLANT
GLOVE BIOGEL PI IND STRL 6.5 (GLOVE) ×1 IMPLANT
GLOVE BIOGEL PI IND STRL 7.5 (GLOVE) ×1 IMPLANT
GLOVE BIOGEL PI INDICATOR 6.5 (GLOVE) ×1
GLOVE BIOGEL PI INDICATOR 7.5 (GLOVE) ×1
GLOVE EXAM NITRILE EXT CUFF MD (GLOVE) ×2 IMPLANT
GLOVE SURG SS PI 6.5 STRL IVOR (GLOVE) ×2 IMPLANT
GOWN STRL REUS W/ TWL LRG LVL3 (GOWN DISPOSABLE) ×3 IMPLANT
GOWN STRL REUS W/TWL LRG LVL3 (GOWN DISPOSABLE) ×3
KIT MARKER MARGIN INK (KITS) ×2 IMPLANT
LIQUID BAND (GAUZE/BANDAGES/DRESSINGS) ×2 IMPLANT
MARKER SKIN DUAL TIP RULER LAB (MISCELLANEOUS) ×2 IMPLANT
NDL SAFETY ECLIPSE 18X1.5 (NEEDLE) ×1 IMPLANT
NEEDLE HYPO 18GX1.5 SHARP (NEEDLE) ×1
NEEDLE HYPO 25X1 1.5 SAFETY (NEEDLE) ×4 IMPLANT
NS IRRIG 1000ML POUR BTL (IV SOLUTION) IMPLANT
PACK BASIN DAY SURGERY FS (CUSTOM PROCEDURE TRAY) ×2 IMPLANT
PENCIL BUTTON HOLSTER BLD 10FT (ELECTRODE) ×2 IMPLANT
SHEET MEDIUM DRAPE 40X70 STRL (DRAPES) IMPLANT
SLEEVE SCD COMPRESS KNEE MED (MISCELLANEOUS) ×2 IMPLANT
SPONGE GAUZE 4X4 12PLY STER LF (GAUZE/BANDAGES/DRESSINGS) IMPLANT
SPONGE LAP 4X18 X RAY DECT (DISPOSABLE) ×2 IMPLANT
STAPLER VISISTAT 35W (STAPLE) IMPLANT
STOCKINETTE IMPERVIOUS LG (DRAPES) IMPLANT
STRIP CLOSURE SKIN 1/2X4 (GAUZE/BANDAGES/DRESSINGS) ×2 IMPLANT
SUT ETHILON 2 0 FS 18 (SUTURE) IMPLANT
SUT MNCRL AB 4-0 PS2 18 (SUTURE) ×2 IMPLANT
SUT MON AB 5-0 PS2 18 (SUTURE) IMPLANT
SUT SILK 2 0 SH (SUTURE) IMPLANT
SUT VIC AB 2-0 SH 27 (SUTURE) ×2
SUT VIC AB 2-0 SH 27XBRD (SUTURE) ×2 IMPLANT
SUT VIC AB 3-0 SH 27 (SUTURE) ×1
SUT VIC AB 3-0 SH 27X BRD (SUTURE) ×1 IMPLANT
SUT VIC AB 5-0 PS2 18 (SUTURE) IMPLANT
SYR CONTROL 10ML LL (SYRINGE) ×4 IMPLANT
TOWEL OR 17X24 6PK STRL BLUE (TOWEL DISPOSABLE) ×2 IMPLANT
TOWEL OR NON WOVEN STRL DISP B (DISPOSABLE) IMPLANT
TUBE CONNECTING 20X1/4 (TUBING) IMPLANT
YANKAUER SUCT BULB TIP NO VENT (SUCTIONS) IMPLANT

## 2014-10-01 NOTE — Transfer of Care (Signed)
Immediate Anesthesia Transfer of Care Note  Patient: Dominique Dillon  Procedure(s) Performed: Procedure(s): RADIOACTIVE SEED GUIDED LEFT BREAST LUMPECTOMY WITH LEFT  AXILLARY SENTINEL LYMPH NODE BIOPSY (Left)  Patient Location: PACU  Anesthesia Type:General  Level of Consciousness: awake  Airway & Oxygen Therapy: Patient Spontanous Breathing and Patient connected to face mask oxygen  Post-op Assessment: Report given to RN and Post -op Vital signs reviewed and stable  Post vital signs: Reviewed and stable  Last Vitals:  Filed Vitals:   10/01/14 0905  BP:   Pulse: 92  Temp:   Resp: 17    Complications: No apparent anesthesia complications

## 2014-10-01 NOTE — Discharge Instructions (Signed)
[facility] °Office Phone Number [phone] ° °BREAST BIOPSY/ PARTIAL MASTECTOMY: POST OP INSTRUCTIONS ° °Always review your discharge instruction sheet given to you by the facility where your surgery was performed. ° °IF YOU HAVE DISABILITY OR FAMILY LEAVE FORMS, YOU MUST BRING THEM TO THE OFFICE FOR PROCESSING.  DO NOT GIVE THEM TO YOUR DOCTOR. ° °1. A prescription for pain medication may be given to you upon discharge.  Take your pain medication as prescribed, if needed.  If narcotic pain medicine is not needed, then you may take acetaminophen (Tylenol), naprosyn (Alleve) or ibuprofen (Advil) as needed. °2. Take your usually prescribed medications unless otherwise directed °3. If you need a refill on your pain medication, please contact your pharmacy.  They will contact our office to request authorization.  Prescriptions will not be filled after 5pm or on week-ends. °4. You should eat very light the first 24 hours after surgery, such as soup, crackers, pudding, etc.  Resume your normal diet the day after surgery. °5. Most patients will experience some swelling and bruising in the breast.  Ice packs and a good support bra will help.  Wear the breast binder provided or a sports bra for 72 hours day and night.  After that wear a sports bra during the day until you return to the office. Swelling and bruising can take several days to resolve.  °6. It is common to experience some constipation if taking pain medication after surgery.  Increasing fluid intake and taking a stool softener will usually help or prevent this problem from occurring.  A mild laxative (Milk of Magnesia or Miralax) should be taken according to package directions if there are no bowel movements after 48 hours. °7. Unless discharge instructions indicate otherwise, you may remove your bandages 48 hours after surgery and you may shower at that time.  You may have steri-strips (small skin tapes) in place directly over the incision.   These strips should be left on the skin for 7-10 days and will come off on their own.  If your surgeon used skin glue on the incision, you may shower in 24 hours.  The glue will flake off over the next 2-3 weeks.  Any sutures or staples will be removed at the office during your follow-up visit. °8. ACTIVITIES:  You may resume regular daily activities (gradually increasing) beginning the next day.  Wearing a good support bra or sports bra minimizes pain and swelling.  You may have sexual intercourse when it is comfortable. °a. You may drive when you no longer are taking prescription pain medication, you can comfortably wear a seatbelt, and you can safely maneuver your car and apply brakes. °b. RETURN TO WORK:  ______________________________________________________________________________________ °9. You should see your doctor in the office for a follow-up appointment approximately two weeks after your surgery.  Your doctor’s nurse will typically make your follow-up appointment when she calls you with your pathology report.  Expect your pathology report 3-4 business days after your surgery.  You may call to check if you do not hear from us after three days. °10. OTHER INSTRUCTIONS: _______________________________________________________________________________________________ _____________________________________________________________________________________________________________________________________ °_____________________________________________________________________________________________________________________________________ °_____________________________________________________________________________________________________________________________________ ° °WHEN TO CALL DR WAKEFIELD: °1. Fever over 101.0 °2. Nausea and/or vomiting. °3. Extreme swelling or bruising. °4. Continued bleeding from incision. °5. Increased pain, redness, or drainage from the incision. ° °The clinic staff is available to  answer your questions during regular business hours.  Please don’t hesitate to call and ask to speak to one of the nurses for   clinical concerns.  If you have a medical emergency, go to the nearest emergency room or call 911.  A surgeon from [facility] is always on call at the hospital. ° °For further questions, please visit [email or web address] ° °Post Anesthesia Home Care Instructions ° °Activity: °Get plenty of rest for the remainder of the day. A responsible adult should stay with you for 24 hours following the procedure.  °For the next 24 hours, DO NOT: °-Drive a car °-Operate machinery °-Drink alcoholic beverages °-Take any medication unless instructed by your physician °-Make any legal decisions or sign important papers. ° °Meals: °Start with liquid foods such as gelatin or soup. Progress to regular foods as tolerated. Avoid greasy, spicy, heavy foods. If nausea and/or vomiting occur, drink only clear liquids until the nausea and/or vomiting subsides. Call your physician if vomiting continues. ° °Special Instructions/Symptoms: °Your throat may feel dry or sore from the anesthesia or the breathing tube placed in your throat during surgery. If this causes discomfort, gargle with warm salt water. The discomfort should disappear within 24 hours. ° °

## 2014-10-01 NOTE — Interval H&P Note (Signed)
History and Physical Interval Note:  10/01/2014 8:57 AM  Dominique Dillon  has presented today for surgery, with the diagnosis of left breast cancer  The various methods of treatment have been discussed with the patient and family. After consideration of risks, benefits and other options for treatment, the patient has consented to  Procedure(s): RADIOACTIVE SEED GUIDED LEFT BREAST LUMPECTOMY WITH LEFT  AXILLARY SENTINEL LYMPH NODE BIOPSY (Left) as a surgical intervention .  The patient's history has been reviewed, patient examined, no change in status, stable for surgery.  I have reviewed the patient's chart and labs.  Questions were answered to the patient's satisfaction.     Dominique Dillon

## 2014-10-01 NOTE — Progress Notes (Signed)
Emotional support during breast injections °

## 2014-10-01 NOTE — H&P (Signed)
Dominique Dillon is an 59 y.o. female.   Chief Complaint: left breast cancer HPI:58 yof who underwent screening mm that showed a spiculated mass in the left upper outer quadrant. targeted US shows a 5x6x7 mm mass in the 130 oclock position 13 cm from the nipple. US of the left axilla is negative. Pathology shows a grade III IDC, 10% er positive, pr negative, her2 not amplified, Ki67 is 35%. She has no complaints referable to either breast. She comes in today to discuss options.     Past Medical History  Diagnosis Date  . GERD (gastroesophageal reflux disease)   . Hypertension   . Hyperlipidemia   . Obesity   . Diabetes mellitus without complication   . Breast cancer of upper-outer quadrant of left female breast 09/12/2014  . Arthritis   . DDD (degenerative disc disease), cervical     Past Surgical History  Procedure Laterality Date  . C section x 2    . Explorartory lap    . Bilatreral knee surgery    . Abdominal hysterectomy    . Cholecystectomy    . Abdominal surgery    . Appendectomy    . Tonsillectomy    . Irrigation and debridement abscess N/A 03/08/2014    Procedure: IRRIGATION AND DEBRIDEMENT ABDOMINAL WALL ABSCESS;  Surgeon: Adin Hector, MD;  Location: [facility];  Service: General;  Laterality: N/A;  . Colonoscopy    . Upper gi endoscopy      Family History  Problem Relation Age of Onset  . Asthma Other   . Cancer Other   . Hyperlipidemia Other   . Heart disease Other   . Hypertension Other   . Stroke Other   . Prostate cancer Father   . Stomach cancer Maternal Aunt   . Pancreatic cancer Maternal Uncle   . Lung cancer Maternal Grandmother   . Breast cancer Maternal Aunt   . Pancreatic cancer Maternal Aunt   . Pancreatic cancer Maternal Uncle    Social History:  reports that she has never smoked. She has never used smokeless tobacco. She reports that she drinks alcohol. She reports that she does not use illicit drugs.  Allergies:  Allergies  Allergen  Reactions  . Biaxin [Clarithromycin]     Makes her feel blah    Medications Prior to Admission  Medication Sig Dispense Refill  . aspirin 81 MG tablet Take 81 mg by mouth daily.    . calcium elemental as carbonate (CALCIUM ANTACID ULTRA) 400 MG tablet Chew 1,000 mg by mouth 3 (three) times daily.    . cetirizine (ZYRTEC) 10 MG chewable tablet Chew 10 mg by mouth daily.    . Cholecalciferol (VITAMIN D) 1000 UNITS capsule Take 1,000 Units by mouth daily.    . ferrous sulfate 325 (65 FE) MG tablet Take 325 mg by mouth daily with breakfast.    . hydrochlorothiazide (HYDRODIURIL) 25 MG tablet Take 25 mg by mouth every other day.     . insulin lispro protamine-lispro (HUMALOG 75/25 MIX) (75-25) 100 UNIT/ML SUSP injection Inject 30 Units into the skin 2 (two) times daily with a meal.    . losartan (COZAAR) 50 MG tablet Take 50 mg by mouth daily.    . metFORMIN (GLUCOPHAGE) 1000 MG tablet Take 1,000 mg by mouth 2 (two) times daily with a meal.    . omega-3 acid ethyl esters (LOVAZA) 1 G capsule Take 1 g by mouth daily.    . pantoprazole (PROTONIX) 40 MG tablet  Take 40 mg by mouth daily.    . Pitavastatin Calcium (LIVALO) 4 MG TABS Take 4 mg by mouth every other day.      Results for orders placed or performed during the hospital encounter of 10/01/14 (from the past 48 hour(s))  Glucose, capillary     Status: Abnormal   Collection Time: 10/01/14  8:27 AM  Result Value Ref Range   Glucose-Capillary 157 (H) 70 - 99 mg/dL   Mm Lt Radioactive Seed Loc Mammo Guide  09/30/2014   CLINICAL DATA:  Preoperative localization for left breast malignancy.  EXAM: MAMMOGRAPHIC GUIDED RADIOACTIVE SEED LOCALIZATION OF THE left BREAST  COMPARISON:  Previous exam(s).  FINDINGS: Patient presents for radioactive seed localization prior to surgical excision of left breast malignancy. I met with the patient and we discussed the procedure of seed localization including benefits and alternatives. We discussed the high  likelihood of a successful procedure. We discussed the risks of the procedure including infection, bleeding, tissue injury and further surgery. We discussed the low dose of radioactivity involved in the procedure. Informed, written consent was given.  The usual time-out protocol was performed immediately prior to the procedure.  Using mammographic guidance, sterile technique, 2% lidocaine and an I-125 radioactive seed, the mass was localized using a lateral approach. The patient's clip placed during the ultrasound-guided core biopsy is located 1 cm medial to the mass. The seed was placed along the posterior aspect of the mass and is located 1 cm lateral to the clip. The follow-up mammogram images confirm the seed in the expected location and are marked for Dr. Donne Hazel.  Follow-up survey of the patient confirms presence of the radioactive seed.  Order number of I-125 seed:  [id number].  Total activity:  0.250 mCi  Reference Date: 09/17/2014  The patient tolerated the procedure well and was released from the [facility]. She was given instructions regarding seed removal.  IMPRESSION: Radioactive seed localization of the left breast. No apparent complications.   Electronically Signed   By: Altamese Cabal M.D.   On: 09/30/2014 12:04    Review of Systems  Constitutional: Negative for fever and chills.  Respiratory: Negative for shortness of breath.   Cardiovascular: Negative for chest pain.  Gastrointestinal: Negative for abdominal pain.    Blood pressure 132/98, pulse 68, temperature 98.2 F (36.8 C), resp. rate 11, height $RemoveBe'5\' 3"'ofYAZxfla$  (1.6 m), weight 204 lb 8 oz (92.761 kg), SpO2 100 %. Physical Exam  Physical Exam Rolm Bookbinder MD; 09/18/2014 4:08 PM) General Mental Status-Alert. Orientation-Oriented X3.  Chest and Lung Exam Chest and lung exam reveals -on auscultation, normal breath sounds, no adventitious sounds and normal vocal resonance.  Breast Nipples-No Discharge. Breast  Lump-No Palpable Breast Mass.  Cardiovascular Cardiovascular examination reveals -normal heart sounds, regular rate and rhythm with no murmurs.  Lymphatic Head & Neck  General Head & Neck Lymphatics: Bilateral - Description - Normal. Axillary  General Axillary Region: Bilateral - Description - Normal. Note: no [place] adenopathy  Assessment/Plan  Assessment & Plan Rolm Bookbinder MD; 09/18/2014 4:15 PM) STAGE I BREAST CANCER, LEFT (174.9  C50.912) Story: Left breast radioactive seed lumpectomy, left axillary sentinel node biopsy We discussed the staging and pathophysiology of breast cancer. We discussed all of the different options for treatment for breast cancer including surgery, chemotherapy, radiation therapy, Herceptin, and antiestrogen therapy. We discussed a sentinel lymph node biopsy as she does not appear to having lymph node involvement right now. We discussed the performance of that with injection  of radioactive tracer. We discussed that she would have an incision underneath her axillary hairline. We discussed that there is a chance of having a positive node with a sentinel lymph node biopsy and we will await the permanent pathology to make any other first further decisions in terms of her treatment. One of these options might be to return to the operating room to perform an axillary lymph node dissection. We discussed about a risk lifetime of chronic shoulder pain as well as lymphedema associated with a sentinel lymph node biopsy. We discussed the options for treatment of the breast cancer which included lumpectomy versus a mastectomy. We discussed the performance of the lumpectomy with seed placement. We discussed up to a 5% chance of a positive margin requiring reexcision in the operating room. We also discussed that she will need radiation therapy if she undergoes lumpectomy. We discussed the mastectomy and the postoperative care for that as well. We discussed that there is no  difference in her survival whether she undergoes lumpectomy with radiation therapy versus a mastectomy. We discussed the risks of operation including bleeding, infection, possible reoperation. She understands her further therapy will be based on what her stages at the time of her operation.   Dominique Dillon 10/01/2014, 8:56 AM

## 2014-10-01 NOTE — Anesthesia Postprocedure Evaluation (Signed)
Anesthesia Post Note  Patient: Dominique Dillon  Procedure(s) Performed: Procedure(s) (LRB): RADIOACTIVE SEED GUIDED LEFT BREAST LUMPECTOMY WITH LEFT  AXILLARY SENTINEL LYMPH NODE BIOPSY (Left)  Anesthesia type: general  Patient location: PACU  Post pain: Pain level controlled  Post assessment: Patient's Cardiovascular Status Stable  Last Vitals:  Filed Vitals:   10/01/14 1157  BP: 140/76  Pulse: 73  Temp: 36.6 C  Resp: 18    Post vital signs: Reviewed and stable  Level of consciousness: sedated  Complications: No apparent anesthesia complications

## 2014-10-01 NOTE — Op Note (Signed)
Preoperative diagnosis: Clinical stage I left breast cancer Postoperative diagnosis: Same as above Procedure: #1 left breast radioactive seed and lumpectomy #2 left axillary sentinel lymph node biopsy #3 injection of blue dye for sentinel lymph node identification Surgeon: Dr. Serita Grammes Anesthesia: Gen. wih pectoral block Estimated blood loss: Minimal Drains: None Specimens: #1 left breast issue marked with paint #2 left axilla sentinel lymph node with count of 32 #3 left axillary sentinel node  with count of 132 Complications none Sponge count correct at completion Dispo to recovery stable  Indications: This is a 59 year old female who had a mammographically found left breast abnormality. This underwent biopsy was found to be invasive ductal carcinoma. She has been seen in our multidisciplinary clinic and we have elected to pursue breast conservation therapy. She had a radioactive seed placed at the lesion prior to beginning.  Procedure: After informed consent was obtained the patient was first injected with technetium in the standard periareolar fashion. She also underwent a left pectoral block. She was given cefazolin. Sequential compression devices were on her legs. She then underwent general anesthesia without complication. Her left breast was prepped and draped in the standard sterile surgical fashion. A surgical timeout was then performed.  She did not have a lot of radioactivity in her axilla. I did inject a mixture of methylene blue and saline in the periareolar fashion and massaged for 2 minutes. I then located the radioactive seed. I made a curvilinear incision in the upper outer quadrant of the left breast. I used the neoprobe to guide the excision of the seed in the surrounding tissue with an attempt to get a clear margin. The seed was confirmed to be in the specimen with the neoprobe. There was no more radioactivity remaining in the breast. Faxitron mammogram was taken which  confirmed removal of the mass clip and seed. This was confirmed by radiology and later by pathology. I placed clips around the margins of my cavity. I was really close to the axilla so I decided to do my sentinel lymph node biopsy from the same incision. I entered the axilla. I used the neoprobe to identify 2 sentinel lymph nodes. There was no blue dye present. There is no background radioactivity. I then obtained hemostasis. I closed the axillary fascia with 2-0 Vicryl. I then closed the breast tissue with 2-0 Vicryl. The skin was closed with 3-0 Vicryl and 4-0 Monocryl. Glue was placed. A breast binder was placed. She tolerated this well and was extubated and transferred to the recovery room in stable condition.

## 2014-10-01 NOTE — Anesthesia Procedure Notes (Addendum)
Anesthesia Regional Block:  Adductor canal block  Pre-Anesthetic Checklist: ,, timeout performed, Correct Patient, Correct Site, Correct Laterality, Correct Procedure, Correct Position, site marked, Risks and benefits discussed,  Surgical consent,  Pre-op evaluation,  At surgeon's request and post-op pain management  Laterality: Left and Upper  Prep: chloraprep       Needles:  Injection technique: Single-shot  Needle Type: Echogenic Stimulator Needle          Additional Needles:  Procedures: ultrasound guided (picture in chart) Adductor canal block Narrative:  Injection made incrementally with aspirations every 5 mL.  Performed by: Personally  Anesthesiologist: MOSER, CHRIS  Additional Notes: H+P and labs reviewed, risks and benefits discussed with patient, procedure tolerated well without complications   Procedure Name: LMA Insertion Performed by: Lieutenant Diego Pre-anesthesia Checklist: Patient identified, Emergency Drugs available, Suction available and Patient being monitored Patient Re-evaluated:Patient Re-evaluated prior to inductionOxygen Delivery Method: Circle System Utilized Preoxygenation: Pre-oxygenation with 100% oxygen Intubation Type: IV induction Ventilation: Mask ventilation without difficulty LMA: LMA inserted LMA Size: 4.0 Number of attempts: 1 Airway Equipment and Method: Bite block Placement Confirmation: positive ETCO2 Tube secured with: Tape Dental Injury: Teeth and Oropharynx as per pre-operative assessment

## 2014-10-01 NOTE — Progress Notes (Signed)
Assisted Dr. Moser with left, ultrasound guided, pectoralis block. Side rails up, monitors on throughout procedure. See vital signs in flow sheet. Tolerated Procedure well. 

## 2014-10-01 NOTE — Anesthesia Preprocedure Evaluation (Signed)
Anesthesia Evaluation  Patient identified by MRN, date of birth, ID band Patient awake    Reviewed: Allergy & Precautions, NPO status , Patient's Chart, lab work & pertinent test results  History of Anesthesia Complications Negative for: history of anesthetic complications  Airway Mallampati: II  TM Distance: >3 FB Neck ROM: Full    Dental  (+) Teeth Intact   Pulmonary neg pulmonary ROS,  breath sounds clear to auscultation        Cardiovascular hypertension, Pt. on medications - angina- Past MI and - CHF - dysrhythmias Rhythm:Regular     Neuro/Psych negative neurological ROS     GI/Hepatic Neg liver ROS, GERD-  Medicated and Controlled,  Endo/Other  diabetes, Type 2, Insulin Dependent, Oral Hypoglycemic AgentsMorbid obesity  Renal/GU      Musculoskeletal  (+) Arthritis -,   Abdominal   Peds  Hematology negative hematology ROS (+)   Anesthesia Other Findings   Reproductive/Obstetrics                             Anesthesia Physical Anesthesia Plan  ASA: III  Anesthesia Plan: General and Regional   Post-op Pain Management:    Induction: Intravenous  Airway Management Planned: LMA  Additional Equipment: None  Intra-op Plan:   Post-operative Plan: Extubation in OR  Informed Consent: I have reviewed the patients History and Physical, chart, labs and discussed the procedure including the risks, benefits and alternatives for the proposed anesthesia with the patient or authorized representative who has indicated his/her understanding and acceptance.   Dental advisory given  Plan Discussed with: CRNA and Surgeon  Anesthesia Plan Comments:         Anesthesia Quick Evaluation

## 2014-10-02 NOTE — Addendum Note (Signed)
Addendum  created 10/02/14 0741 by Tawni Millers, CRNA   Modules edited: Charges VN

## 2014-10-03 ENCOUNTER — Encounter (HOSPITAL_BASED_OUTPATIENT_CLINIC_OR_DEPARTMENT_OTHER): Payer: Self-pay | Admitting: General Surgery

## 2014-10-04 ENCOUNTER — Encounter: Payer: Self-pay | Admitting: *Deleted

## 2014-10-04 ENCOUNTER — Other Ambulatory Visit (INDEPENDENT_AMBULATORY_CARE_PROVIDER_SITE_OTHER): Payer: Self-pay | Admitting: General Surgery

## 2014-10-04 NOTE — Progress Notes (Signed)
Ordered mammoprint per Dr. Jana Hakim order.  Faxed requisition to [facility] and informed Christy in pathology.

## 2014-10-15 ENCOUNTER — Encounter (HOSPITAL_BASED_OUTPATIENT_CLINIC_OR_DEPARTMENT_OTHER): Payer: Self-pay | Admitting: *Deleted

## 2014-10-15 NOTE — Pre-Procedure Instructions (Signed)
To come for BMET 

## 2014-10-16 ENCOUNTER — Other Ambulatory Visit (INDEPENDENT_AMBULATORY_CARE_PROVIDER_SITE_OTHER): Payer: Self-pay | Admitting: General Surgery

## 2014-10-16 ENCOUNTER — Encounter (HOSPITAL_BASED_OUTPATIENT_CLINIC_OR_DEPARTMENT_OTHER)
Admission: RE | Admit: 2014-10-16 | Discharge: 2014-10-16 | Disposition: A | Discharge status: Home / Self Care | Payer: BC Managed Care – PPO | Source: Ambulatory Visit | Attending: General Surgery | Admitting: General Surgery

## 2014-10-16 ENCOUNTER — Telehealth: Payer: Self-pay | Admitting: *Deleted

## 2014-10-16 ENCOUNTER — Other Ambulatory Visit: Payer: Self-pay | Admitting: Oncology

## 2014-10-16 DIAGNOSIS — Z01818 Encounter for other preprocedural examination: Secondary | ICD-10-CM | POA: Insufficient documentation

## 2014-10-16 DIAGNOSIS — Z9049 Acquired absence of other specified parts of digestive tract: Secondary | ICD-10-CM | POA: Diagnosis not present

## 2014-10-16 DIAGNOSIS — C50412 Malignant neoplasm of upper-outer quadrant of left female breast: Secondary | ICD-10-CM | POA: Insufficient documentation

## 2014-10-16 DIAGNOSIS — Z794 Long term (current) use of insulin: Secondary | ICD-10-CM | POA: Diagnosis not present

## 2014-10-16 DIAGNOSIS — Z881 Allergy status to other antibiotic agents status: Secondary | ICD-10-CM | POA: Diagnosis not present

## 2014-10-16 DIAGNOSIS — I10 Essential (primary) hypertension: Secondary | ICD-10-CM | POA: Diagnosis not present

## 2014-10-16 DIAGNOSIS — C50912 Malignant neoplasm of unspecified site of left female breast: Secondary | ICD-10-CM | POA: Diagnosis present

## 2014-10-16 DIAGNOSIS — M5030 Other cervical disc degeneration, unspecified cervical region: Secondary | ICD-10-CM | POA: Diagnosis not present

## 2014-10-16 DIAGNOSIS — J45909 Unspecified asthma, uncomplicated: Secondary | ICD-10-CM | POA: Diagnosis not present

## 2014-10-16 DIAGNOSIS — E119 Type 2 diabetes mellitus without complications: Secondary | ICD-10-CM | POA: Diagnosis not present

## 2014-10-16 DIAGNOSIS — K219 Gastro-esophageal reflux disease without esophagitis: Secondary | ICD-10-CM | POA: Diagnosis not present

## 2014-10-16 DIAGNOSIS — Z90710 Acquired absence of both cervix and uterus: Secondary | ICD-10-CM | POA: Diagnosis not present

## 2014-10-16 DIAGNOSIS — D649 Anemia, unspecified: Secondary | ICD-10-CM | POA: Diagnosis not present

## 2014-10-16 DIAGNOSIS — M13861 Other specified arthritis, right knee: Secondary | ICD-10-CM | POA: Diagnosis not present

## 2014-10-16 DIAGNOSIS — M13862 Other specified arthritis, left knee: Secondary | ICD-10-CM | POA: Diagnosis not present

## 2014-10-16 DIAGNOSIS — E669 Obesity, unspecified: Secondary | ICD-10-CM | POA: Diagnosis not present

## 2014-10-16 LAB — BASIC METABOLIC PANEL
Anion gap: 5 (ref 5–15)
BUN: 11 mg/dL (ref 6–23)
CALCIUM: 9.2 mg/dL (ref 8.4–10.5)
CO2: 29 mmol/L (ref 19–32)
CREATININE: 0.73 mg/dL (ref 0.50–1.10)
Chloride: 108 mmol/L (ref 96–112)
Glucose, Bld: 155 mg/dL — ABNORMAL HIGH (ref 70–99)
Potassium: 3.9 mmol/L (ref 3.5–5.1)
Sodium: 142 mmol/L (ref 135–145)

## 2014-10-16 NOTE — Telephone Encounter (Signed)
Received mammaprint results of High Risk -0.782. Copy given to Dr. Jana Hakim, original to HIM. Dr. Donne Hazel notified.

## 2014-10-17 ENCOUNTER — Encounter (HOSPITAL_COMMUNITY): Payer: Self-pay

## 2014-10-21 ENCOUNTER — Other Ambulatory Visit: Payer: Self-pay | Admitting: Oncology

## 2014-10-21 ENCOUNTER — Ambulatory Visit (HOSPITAL_COMMUNITY): Payer: BC Managed Care – PPO

## 2014-10-21 ENCOUNTER — Ambulatory Visit (HOSPITAL_BASED_OUTPATIENT_CLINIC_OR_DEPARTMENT_OTHER): Payer: BC Managed Care – PPO | Admitting: Anesthesiology

## 2014-10-21 ENCOUNTER — Encounter (HOSPITAL_BASED_OUTPATIENT_CLINIC_OR_DEPARTMENT_OTHER)
Admission: RE | Disposition: A | Discharge status: Home / Self Care | Payer: Self-pay | Source: Ambulatory Visit | Attending: General Surgery

## 2014-10-21 ENCOUNTER — Ambulatory Visit (HOSPITAL_BASED_OUTPATIENT_CLINIC_OR_DEPARTMENT_OTHER)
Admission: RE | Admit: 2014-10-21 | Discharge: 2014-10-21 | Disposition: A | Discharge status: Home / Self Care | Payer: BC Managed Care – PPO | Source: Ambulatory Visit | Attending: General Surgery | Admitting: General Surgery

## 2014-10-21 ENCOUNTER — Encounter (HOSPITAL_BASED_OUTPATIENT_CLINIC_OR_DEPARTMENT_OTHER): Payer: Self-pay

## 2014-10-21 DIAGNOSIS — C50412 Malignant neoplasm of upper-outer quadrant of left female breast: Secondary | ICD-10-CM | POA: Insufficient documentation

## 2014-10-21 DIAGNOSIS — M5030 Other cervical disc degeneration, unspecified cervical region: Secondary | ICD-10-CM | POA: Insufficient documentation

## 2014-10-21 DIAGNOSIS — J45909 Unspecified asthma, uncomplicated: Secondary | ICD-10-CM | POA: Insufficient documentation

## 2014-10-21 DIAGNOSIS — Z95828 Presence of other vascular implants and grafts: Secondary | ICD-10-CM

## 2014-10-21 DIAGNOSIS — D649 Anemia, unspecified: Secondary | ICD-10-CM | POA: Insufficient documentation

## 2014-10-21 DIAGNOSIS — I10 Essential (primary) hypertension: Secondary | ICD-10-CM | POA: Insufficient documentation

## 2014-10-21 DIAGNOSIS — C50919 Malignant neoplasm of unspecified site of unspecified female breast: Secondary | ICD-10-CM

## 2014-10-21 DIAGNOSIS — Z9049 Acquired absence of other specified parts of digestive tract: Secondary | ICD-10-CM | POA: Insufficient documentation

## 2014-10-21 DIAGNOSIS — E669 Obesity, unspecified: Secondary | ICD-10-CM | POA: Insufficient documentation

## 2014-10-21 DIAGNOSIS — E119 Type 2 diabetes mellitus without complications: Secondary | ICD-10-CM | POA: Insufficient documentation

## 2014-10-21 DIAGNOSIS — C50912 Malignant neoplasm of unspecified site of left female breast: Secondary | ICD-10-CM

## 2014-10-21 DIAGNOSIS — K219 Gastro-esophageal reflux disease without esophagitis: Secondary | ICD-10-CM | POA: Insufficient documentation

## 2014-10-21 DIAGNOSIS — Z794 Long term (current) use of insulin: Secondary | ICD-10-CM | POA: Insufficient documentation

## 2014-10-21 DIAGNOSIS — Z90710 Acquired absence of both cervix and uterus: Secondary | ICD-10-CM | POA: Insufficient documentation

## 2014-10-21 DIAGNOSIS — Z881 Allergy status to other antibiotic agents status: Secondary | ICD-10-CM | POA: Insufficient documentation

## 2014-10-21 DIAGNOSIS — M13861 Other specified arthritis, right knee: Secondary | ICD-10-CM | POA: Insufficient documentation

## 2014-10-21 DIAGNOSIS — M13862 Other specified arthritis, left knee: Secondary | ICD-10-CM | POA: Insufficient documentation

## 2014-10-21 HISTORY — PX: PORTACATH PLACEMENT: SHX2246

## 2014-10-21 HISTORY — PX: RE-EXCISION OF BREAST LUMPECTOMY: SHX6048

## 2014-10-21 HISTORY — DX: Reserved for inherently not codable concepts without codable children: IMO0001

## 2014-10-21 HISTORY — DX: Personal history of diseases of the blood and blood-forming organs and certain disorders involving the immune mechanism: Z86.2

## 2014-10-21 HISTORY — DX: Other asthma: J45.998

## 2014-10-21 HISTORY — DX: Type 2 diabetes mellitus without complications: E11.9

## 2014-10-21 HISTORY — DX: Long term (current) use of insulin: Z79.4

## 2014-10-21 LAB — GLUCOSE, CAPILLARY
GLUCOSE-CAPILLARY: 86 mg/dL (ref 70–99)
Glucose-Capillary: 86 mg/dL (ref 70–99)
Glucose-Capillary: 128 mg/dL — ABNORMAL HIGH (ref 70–99)
Glucose-Capillary: 84 mg/dL (ref 70–99)

## 2014-10-21 SURGERY — INSERTION, TUNNELED CENTRAL VENOUS DEVICE, WITH PORT
Anesthesia: General | Site: Chest | Laterality: Right

## 2014-10-21 MED ORDER — MIDAZOLAM HCL 5 MG/5ML IJ SOLN
INTRAMUSCULAR | Status: DC | PRN
  Administered 2014-10-21: 2 mg via INTRAVENOUS

## 2014-10-21 MED ORDER — MIDAZOLAM HCL 2 MG/2ML IJ SOLN
INTRAMUSCULAR | Status: AC
  Filled 2014-10-21: qty 2

## 2014-10-21 MED ORDER — MIDAZOLAM HCL 2 MG/ML PO SYRP: 12.0000 mg | ORAL_SOLUTION | Freq: Once | ORAL | Status: DC | PRN

## 2014-10-21 MED ORDER — HEPARIN (PORCINE) IN NACL 2-0.9 UNIT/ML-% IJ SOLN
INTRAMUSCULAR | Status: AC
  Filled 2014-10-21: qty 500

## 2014-10-21 MED ORDER — PROPOFOL 10 MG/ML IV BOLUS
INTRAVENOUS | Status: DC | PRN
  Administered 2014-10-21: 200 mg via INTRAVENOUS

## 2014-10-21 MED ORDER — BUPIVACAINE HCL (PF) 0.25 % IJ SOLN
INTRAMUSCULAR | Status: AC
  Filled 2014-10-21: qty 30

## 2014-10-21 MED ORDER — PROMETHAZINE HCL 25 MG/ML IJ SOLN
6.2500 mg | Freq: Four times a day (QID) | INTRAMUSCULAR | Status: DC | PRN
  Administered 2014-10-21: 6.25 mg via INTRAVENOUS

## 2014-10-21 MED ORDER — FENTANYL CITRATE 0.05 MG/ML IJ SOLN
25.0000 ug | INTRAMUSCULAR | Status: DC | PRN
  Administered 2014-10-21: 25 ug via INTRAVENOUS
  Administered 2014-10-21: 50 ug via INTRAVENOUS

## 2014-10-21 MED ORDER — FENTANYL CITRATE 0.05 MG/ML IJ SOLN: 50.0000 ug | INTRAMUSCULAR | Status: DC | PRN

## 2014-10-21 MED ORDER — FENTANYL CITRATE 0.05 MG/ML IJ SOLN
INTRAMUSCULAR | Status: DC | PRN
  Administered 2014-10-21 (×2): 50 ug via INTRAVENOUS

## 2014-10-21 MED ORDER — HEPARIN SOD (PORK) LOCK FLUSH 100 UNIT/ML IV SOLN
INTRAVENOUS | Status: AC
  Filled 2014-10-21: qty 5

## 2014-10-21 MED ORDER — PROMETHAZINE HCL 25 MG/ML IJ SOLN
INTRAMUSCULAR | Status: AC
  Filled 2014-10-21: qty 1

## 2014-10-21 MED ORDER — OXYCODONE-ACETAMINOPHEN 10-325 MG PO TABS: 1.0000 | ORAL_TABLET | Freq: Four times a day (QID) | ORAL | Status: DC | PRN

## 2014-10-21 MED ORDER — EPHEDRINE SULFATE 50 MG/ML IJ SOLN
INTRAMUSCULAR | Status: DC | PRN
  Administered 2014-10-21: 10 mg via INTRAVENOUS

## 2014-10-21 MED ORDER — PROPOFOL 10 MG/ML IV BOLUS
INTRAVENOUS | Status: AC
  Filled 2014-10-21: qty 20

## 2014-10-21 MED ORDER — HEPARIN (PORCINE) IN NACL 2-0.9 UNIT/ML-% IJ SOLN
INTRAMUSCULAR | Status: DC | PRN
  Administered 2014-10-21: 1 via INTRAVENOUS

## 2014-10-21 MED ORDER — LIDOCAINE HCL (CARDIAC) 20 MG/ML IV SOLN
INTRAVENOUS | Status: DC | PRN
  Administered 2014-10-21: 80 mg via INTRAVENOUS

## 2014-10-21 MED ORDER — FENTANYL CITRATE 0.05 MG/ML IJ SOLN
INTRAMUSCULAR | Status: AC
  Filled 2014-10-21: qty 4

## 2014-10-21 MED ORDER — FENTANYL CITRATE 0.05 MG/ML IJ SOLN
INTRAMUSCULAR | Status: AC
  Filled 2014-10-21: qty 2

## 2014-10-21 MED ORDER — BUPIVACAINE-EPINEPHRINE 0.25% -1:200000 IJ SOLN: INTRAMUSCULAR | Status: DC | PRN

## 2014-10-21 MED ORDER — OXYCODONE HCL 5 MG PO TABS: 5.0000 mg | ORAL_TABLET | Freq: Once | ORAL | Status: DC | PRN

## 2014-10-21 MED ORDER — MIDAZOLAM HCL 2 MG/2ML IJ SOLN: 1.0000 mg | INTRAMUSCULAR | Status: DC | PRN

## 2014-10-21 MED ORDER — OXYCODONE HCL 5 MG/5ML PO SOLN: 5.0000 mg | Freq: Once | ORAL | Status: DC | PRN

## 2014-10-21 MED ORDER — CEFAZOLIN SODIUM-DEXTROSE 2-3 GM-% IV SOLR
INTRAVENOUS | Status: AC
  Filled 2014-10-21: qty 50

## 2014-10-21 MED ORDER — HEPARIN SOD (PORK) LOCK FLUSH 100 UNIT/ML IV SOLN
INTRAVENOUS | Status: DC | PRN
  Administered 2014-10-21: 500 [IU]

## 2014-10-21 MED ORDER — ONDANSETRON HCL 4 MG/2ML IJ SOLN: 4.0000 mg | Freq: Four times a day (QID) | INTRAMUSCULAR | Status: DC | PRN

## 2014-10-21 MED ORDER — LACTATED RINGERS IV SOLN
INTRAVENOUS | Status: DC
  Administered 2014-10-21 (×2): via INTRAVENOUS

## 2014-10-21 MED ORDER — CEFAZOLIN SODIUM-DEXTROSE 2-3 GM-% IV SOLR
2.0000 g | INTRAVENOUS | Status: AC
  Administered 2014-10-21: 2 g via INTRAVENOUS

## 2014-10-21 MED ORDER — ONDANSETRON HCL 4 MG/2ML IJ SOLN
INTRAMUSCULAR | Status: DC | PRN
  Administered 2014-10-21: 4 mg via INTRAVENOUS

## 2014-10-21 SURGICAL SUPPLY — 67 items
APPLIER CLIP 9.375 MED OPEN (MISCELLANEOUS)
BAG DECANTER FOR FLEXI CONT (MISCELLANEOUS) ×3 IMPLANT
BENZOIN TINCTURE PRP APPL 2/3 (GAUZE/BANDAGES/DRESSINGS) IMPLANT
BINDER BREAST LRG (GAUZE/BANDAGES/DRESSINGS) IMPLANT
BINDER BREAST MEDIUM (GAUZE/BANDAGES/DRESSINGS) IMPLANT
BINDER BREAST XLRG (GAUZE/BANDAGES/DRESSINGS) ×3 IMPLANT
BINDER BREAST XXLRG (GAUZE/BANDAGES/DRESSINGS) IMPLANT
BLADE SURG 11 STRL SS (BLADE) ×3 IMPLANT
BLADE SURG 15 STRL LF DISP TIS (BLADE) ×2 IMPLANT
BLADE SURG 15 STRL SS (BLADE) ×1
CANISTER SUCT 1200ML W/VALVE (MISCELLANEOUS) ×3 IMPLANT
CHLORAPREP W/TINT 26ML (MISCELLANEOUS) ×3 IMPLANT
CLIP APPLIE 9.375 MED OPEN (MISCELLANEOUS) IMPLANT
COVER BACK TABLE 60X90IN (DRAPES) ×3 IMPLANT
COVER MAYO STAND STRL (DRAPES) ×3 IMPLANT
DECANTER SPIKE VIAL GLASS SM (MISCELLANEOUS) ×3 IMPLANT
DEVICE DUBIN W/COMP PLATE 8390 (MISCELLANEOUS) IMPLANT
DRAPE C-ARM 42X72 X-RAY (DRAPES) ×3 IMPLANT
DRAPE LAPAROSCOPIC ABDOMINAL (DRAPES) ×3 IMPLANT
DRAPE UTILITY XL STRL (DRAPES) ×6 IMPLANT
DRSG TEGADERM 4X4.75 (GAUZE/BANDAGES/DRESSINGS) ×3 IMPLANT
ELECT COATED BLADE 2.86 ST (ELECTRODE) ×3 IMPLANT
ELECT REM PT RETURN 9FT ADLT (ELECTROSURGICAL) ×3
ELECTRODE REM PT RTRN 9FT ADLT (ELECTROSURGICAL) ×2 IMPLANT
GLOVE BIO SURGEON STRL SZ 6.5 (GLOVE) ×3 IMPLANT
GLOVE BIO SURGEON STRL SZ7 (GLOVE) ×6 IMPLANT
GLOVE BIOGEL PI IND STRL 7.0 (GLOVE) ×2 IMPLANT
GLOVE BIOGEL PI IND STRL 7.5 (GLOVE) ×2 IMPLANT
GLOVE BIOGEL PI INDICATOR 7.0 (GLOVE) ×1
GLOVE BIOGEL PI INDICATOR 7.5 (GLOVE) ×1
GOWN STRL REUS W/ TWL LRG LVL3 (GOWN DISPOSABLE) ×6 IMPLANT
GOWN STRL REUS W/TWL LRG LVL3 (GOWN DISPOSABLE) ×3
IV KIT MINILOC 20X1 SAFETY (NEEDLE) IMPLANT
KIT PORT POWER 8FR ISP CVUE (Catheter) ×3 IMPLANT
LIQUID BAND (GAUZE/BANDAGES/DRESSINGS) ×3 IMPLANT
MARKER SKIN DUAL TIP RULER LAB (MISCELLANEOUS) ×3 IMPLANT
NDL SAFETY ECLIPSE 18X1.5 (NEEDLE) IMPLANT
NEEDLE HYPO 18GX1.5 SHARP (NEEDLE)
NEEDLE HYPO 25X1 1.5 SAFETY (NEEDLE) ×3 IMPLANT
NS IRRIG 1000ML POUR BTL (IV SOLUTION) IMPLANT
PACK BASIN DAY SURGERY FS (CUSTOM PROCEDURE TRAY) ×3 IMPLANT
PENCIL BUTTON HOLSTER BLD 10FT (ELECTRODE) ×3 IMPLANT
SLEEVE SCD COMPRESS KNEE MED (MISCELLANEOUS) ×3 IMPLANT
SPONGE GAUZE 4X4 12PLY STER LF (GAUZE/BANDAGES/DRESSINGS) ×3 IMPLANT
SPONGE LAP 4X18 X RAY DECT (DISPOSABLE) ×3 IMPLANT
STAPLER VISISTAT 35W (STAPLE) IMPLANT
STRIP CLOSURE SKIN 1/2X4 (GAUZE/BANDAGES/DRESSINGS) ×3 IMPLANT
SUT MNCRL AB 3-0 PS2 18 (SUTURE) IMPLANT
SUT MNCRL AB 4-0 PS2 18 (SUTURE) IMPLANT
SUT MON AB 4-0 PC3 18 (SUTURE) ×3 IMPLANT
SUT MON AB 5-0 PS2 18 (SUTURE) ×3 IMPLANT
SUT PROLENE 2 0 SH DA (SUTURE) ×3 IMPLANT
SUT SILK 2 0 SH (SUTURE) ×3 IMPLANT
SUT SILK 2 0 TIES 17X18 (SUTURE)
SUT SILK 2-0 18XBRD TIE BLK (SUTURE) IMPLANT
SUT VIC AB 2-0 SH 27 (SUTURE) ×1
SUT VIC AB 2-0 SH 27XBRD (SUTURE) ×2 IMPLANT
SUT VIC AB 3-0 SH 27 (SUTURE) ×1
SUT VIC AB 3-0 SH 27X BRD (SUTURE) ×2 IMPLANT
SUT VIC AB 5-0 PS2 18 (SUTURE) IMPLANT
SUT VICRYL AB 3 0 TIES (SUTURE) IMPLANT
SYR 5ML LUER SLIP (SYRINGE) ×3 IMPLANT
SYR CONTROL 10ML LL (SYRINGE) ×3 IMPLANT
TOWEL OR 17X24 6PK STRL BLUE (TOWEL DISPOSABLE) ×3 IMPLANT
TOWEL OR NON WOVEN STRL DISP B (DISPOSABLE) IMPLANT
TUBE CONNECTING 20X1/4 (TUBING) ×3 IMPLANT
YANKAUER SUCT BULB TIP NO VENT (SUCTIONS) ×3 IMPLANT

## 2014-10-21 NOTE — H&P (Signed)
Dominique Dillon is an 59 y.o. female.   Chief Complaint: breast cancer HPI: 44 yof s/p left lumpectomy/sn who has 1.4 cm 11 % er pos, pr neg, her2 negative IDC with Ki of 35%. Her 2 sentinel nodes are negative. tumor is at posterior margin but otherwise clear. She has some soreness but has good rom and is doing well. Her mammaprint is high and will need chemotherapy. This was confirmed by email from Dr Jana Hakim. I discussed with her by phone need for reoperation for margin clearance   Past Medical History  Diagnosis Date  . GERD (gastroesophageal reflux disease)   . Obesity   . Breast cancer of upper-outer quadrant of left female breast 09/12/2014  . History of anemia     still takes iron supplement  . Arthritis     knees  . Hypertension     under control with med., has been on med. since 1990s  . Insulin dependent diabetes mellitus   . Seasonal asthma     no current med.  . DDD (degenerative disc disease), cervical     Past Surgical History  Procedure Laterality Date  . Cesarean section      x 2  . Knee arthroscopy Bilateral   . Abdominal hysterectomy      complete  . Cholecystectomy    . Appendectomy    . Tonsillectomy    . Irrigation and debridement abscess N/A 03/08/2014    Procedure: IRRIGATION AND DEBRIDEMENT ABDOMINAL WALL ABSCESS;  Surgeon: Adin Hector, MD;  Location: [facility];  Service: General;  Laterality: N/A;  . Colonoscopy    . Upper gi endoscopy    . Radioactive seed guided mastectomy with axillary sentinel lymph node biopsy Left 10/01/2014    Procedure: RADIOACTIVE SEED GUIDED LEFT BREAST LUMPECTOMY WITH LEFT  AXILLARY SENTINEL LYMPH NODE BIOPSY;  Surgeon: Rolm Bookbinder, MD;  Location: [facility];  Service: General;  Laterality: Left;  . Carpal tunnel release Right 08/06/2004  . Trigger finger release Right 08/06/2004    middle finger    Family History  Problem Relation Age of Onset  . Prostate cancer Father   . Stomach cancer Maternal  Aunt   . Pancreatic cancer Maternal Uncle   . Lung cancer Maternal Grandmother   . Breast cancer Maternal Aunt   . Pancreatic cancer Maternal Aunt   . Pancreatic cancer Maternal Uncle    Social History:  reports that she has never smoked. She has never used smokeless tobacco. She reports that she does not drink alcohol or use illicit drugs.  Allergies:  Allergies  Allergen Reactions  . Biaxin [Clarithromycin] Other (See Comments)    UNKNOWN    No prescriptions prior to admission     ROS negative except for low blood sugar   Height _0  (1.6 m), weight 204 lb (92.534 kg). Physical Exam  Vitals (Alisha Spillers MA; 10/16/2014 9:10 AM) 10/16/2014 9:10 AM Weight: 204 lb Height: 63in Body Surface Area: 2.03 m Body Mass Index: 36.14 kg/m Pulse: 68 (Regular)  BP: 124/82 (Sitting, Left Arm, Standard) Physical Exam Rolm Bookbinder MD; 10/16/2014 9:28 AM) Breast Note: left breast incision (sentinel node through same incision) without infection cv rrr Lungs clear  Assessment/Plan Assessment & Plan Rolm Bookbinder MD; 10/16/2014 9:29 AM) STAGE I BREAST CANCER, LEFT (174.9  C50.912) Story: Left breast posterior margin reexcision await mammaprint to determine chemotherapy and port  we discussed need for margin reexcision to clear. She shouldnt need anymore surgery as this will  be pectoralis muscle. Will also place port at same time     [facility] 10/21/2014, 7:09 AM

## 2014-10-21 NOTE — Op Note (Signed)
Preoperative diagnosis: #1 need for venous access for chemotherapy #2 left breast cancer with positive posterior margin Postoperative diagnosis: Same as above Procedure: #1 right subclavian PowerPort insertion #2 reexcision of left breast posterior margin Surgeon: Dr. Serita Grammes Anesthesia: Gen. Estimated blood loss: Minimal Specimens: Left breast tissue marked short superior, long lateral, double deep Complications: None Sponge and needle count was correct at completion Disposition to recovery stable  Indications: This is a 59 year old female who had a newly diagnosed left breast cancer. She underwent lumpectomy and sentinel node biopsy. Her posterior margin is positive. She also in the meantime is undergone a mammogram which is shown high risk. I discussed this with medical oncology and she will be getting chemotherapy. I discussed with her reexcision of her posterior margin as well as placement of a port.  Procedure: After informed consent was obtained the patient was taken to the operating room. She was given cefazolin. Sequential compression devices were on her legs. She was placed under general anesthesia without complication. She was then prepped and draped in the standard sterile surgical fashion. Surgical timeout was then performed.  I accessed her subclavian vein on the first pass. However she was moved and I was unable to access this. I did an additional pass medially and I was able to access her subclavian vein. It did take 3 sticks to access her subclavian vein. I then followed the wire with fluoroscopy and it was in good position. I got a lot of ectopy with the wire deep. It was pulled back. I then made a pocket below this. I tunneled the line between the 2 sites. I then dilated the tract. I placed the dilator and sheath assembly under fluoroscopic vision. I then removed my dilator. I began passing the line and had some difficulty through the dilator. I then put the wire back  through the line area I was unable to thread the line over the wire while I pulled the dilator back to put it in good position. The sheath was then removed. The wire was removed. The line was re-tunneled back to the port site. I then pulled this back to be in the superior vena cava. I then attached the port and sutured this down with 2-0 Prolene. This flushed easily and aspirated blood. I placed heparin in this. I closed this with 3-0 Vicryl for Monocryl. Dermabond was placed over this.  I then reopened her left lumpectomy incision. I removed all the sutures I used to close the breast tissue. I identified in the posterior margin and removed this in its entirety including a small portion of the medial lateral superior and inferior margins. My clips that marked deep were identifiable. I removed this down to her pectoralis muscle including her fascia. Hemostasis is obtained. I then reclosed this with 2-0 Vicryl, 3-0 Vicryl, and 4-0 Monocryl. Glue was placed over this. A breast binder was placed. She tolerated this well was extubated and transferred to recovery stable.

## 2014-10-21 NOTE — Discharge Instructions (Signed)
[facility] °Office Phone Number [phone] ° °BREAST BIOPSY/ PARTIAL MASTECTOMY: POST OP INSTRUCTIONS ° °Always review your discharge instruction sheet given to you by the facility where your surgery was performed. ° °IF YOU HAVE DISABILITY OR FAMILY LEAVE FORMS, YOU MUST BRING THEM TO THE OFFICE FOR PROCESSING.  DO NOT GIVE THEM TO YOUR DOCTOR. ° °1. A prescription for pain medication may be given to you upon discharge.  Take your pain medication as prescribed, if needed.  If narcotic pain medicine is not needed, then you may take acetaminophen (Tylenol), naprosyn (Alleve) or ibuprofen (Advil) as needed. °2. Take your usually prescribed medications unless otherwise directed °3. If you need a refill on your pain medication, please contact your pharmacy.  They will contact our office to request authorization.  Prescriptions will not be filled after 5pm or on week-ends. °4. You should eat very light the first 24 hours after surgery, such as soup, crackers, pudding, etc.  Resume your normal diet the day after surgery. °5. Most patients will experience some swelling and bruising in the breast.  Ice packs and a good support bra will help.  Wear the breast binder provided or a sports bra for 72 hours day and night.  After that wear a sports bra during the day until you return to the office. Swelling and bruising can take several days to resolve.  °6. It is common to experience some constipation if taking pain medication after surgery.  Increasing fluid intake and taking a stool softener will usually help or prevent this problem from occurring.  A mild laxative (Milk of Magnesia or Miralax) should be taken according to package directions if there are no bowel movements after 48 hours. °7. Unless discharge instructions indicate otherwise, you may remove your bandages 48 hours after surgery and you may shower at that time.  You may have steri-strips (small skin tapes) in place directly over the incision.   These strips should be left on the skin for 7-10 days and will come off on their own.  If your surgeon used skin glue on the incision, you may shower in 24 hours.  The glue will flake off over the next 2-3 weeks.  Any sutures or staples will be removed at the office during your follow-up visit. °8. ACTIVITIES:  You may resume regular daily activities (gradually increasing) beginning the next day.  Wearing a good support bra or sports bra minimizes pain and swelling.  You may have sexual intercourse when it is comfortable. °a. You may drive when you no longer are taking prescription pain medication, you can comfortably wear a seatbelt, and you can safely maneuver your car and apply brakes. °b. RETURN TO WORK:  ______________________________________________________________________________________ °9. You should see your doctor in the office for a follow-up appointment approximately two weeks after your surgery.  Your doctor’s nurse will typically make your follow-up appointment when she calls you with your pathology report.  Expect your pathology report 3-4 business days after your surgery.  You may call to check if you do not hear from us after three days. °10. OTHER INSTRUCTIONS: _______________________________________________________________________________________________ _____________________________________________________________________________________________________________________________________ °_____________________________________________________________________________________________________________________________________ °_____________________________________________________________________________________________________________________________________ ° °WHEN TO CALL DR WAKEFIELD: °1. Fever over 101.0 °2. Nausea and/or vomiting. °3. Extreme swelling or bruising. °4. Continued bleeding from incision. °5. Increased pain, redness, or drainage from the incision. ° °The clinic staff is available to  answer your questions during regular business hours.  Please don’t hesitate to call and ask to speak to one of the nurses for   clinical concerns.  If you have a medical emergency, go to the nearest emergency room or call 911.  A surgeon from [facility] is always on call at the hospital.  For further questions, please visit [email or web address]       PORT-A-CATH: POST OP INSTRUCTIONS  Always review your discharge instruction sheet given to you by the facility where your surgery was performed.   1. A prescription for pain medication may be given to you upon discharge. Take your pain medication as prescribed, if needed. If narcotic pain medicine is not needed, then you make take acetaminophen (Tylenol) or ibuprofen (Advil) as needed.  2. Take your usually prescribed medications unless otherwise directed. 3. If you need a refill on your pain medication, please contact our office. All narcotic pain medicine now requires a paper prescription.  Phoned in and fax refills are no longer allowed by law.  Prescriptions will not be filled after 5 pm or on weekends.  4. You should follow a light diet for the remainder of the day after your procedure. 5. Most patients will experience some mild swelling and/or bruising in the area of the incision. It may take several days to resolve. 6. It is common to experience some constipation if taking pain medication after surgery. Increasing fluid intake and taking a stool softener (such as Colace) will usually help or prevent this problem from occurring. A mild laxative (Milk of Magnesia or Miralax) should be taken according to package directions if there are no bowel movements after 48 hours.  7. Unless discharge instructions indicate otherwise, you may remove your bandages 48 hours after surgery, and you may shower at that time. You may have steri-strips (small white skin tapes) in place directly over the incision.  These strips should be left on  the skin for 7-10 days.  If your surgeon used Dermabond (skin glue) on the incision, you may shower in 24 hours.  The glue will flake off over the next 2-3 weeks.  8. If your port is left accessed at the end of surgery (needle left in port), the dressing cannot get wet and should only by changed by a healthcare professional. When the port is no longer accessed (when the needle has been removed), follow step 7.   9. ACTIVITIES:  Limit activity involving your arms for the next 72 hours. Do no strenuous exercise or activity for 1 week. You may drive when you are no longer taking prescription pain medication, you can comfortably wear a seatbelt, and you can maneuver your car. 10.You may need to see your doctor in the office for a follow-up appointment.  Please       check with your doctor.  11.When you receive a new Port-a-Cath, you will get a product guide and        ID card.  Please keep them in case you need them.  WHEN TO CALL YOUR DOCTOR [phone]): 1. Fever over 101.0 2. Chills 3. Continued bleeding from incision 4. Increased redness and tenderness at the site 5. Shortness of breath, difficulty breathing   The clinic staff is available to answer your questions during regular business hours. Please dont hesitate to call and ask to speak to one of the nurses or medical assistants for clinical concerns. If you have a medical emergency, go to the nearest emergency room or call 911.  A surgeon from [facility] is always on call at the hospital.     For further information, please visit  [email or web address]   Post Anesthesia Home Care Instructions  Activity: Get plenty of rest for the remainder of the day. A responsible adult should stay with you for 24 hours following the procedure.  For the next 24 hours, DO NOT: -Drive a car -Paediatric nurse -Drink alcoholic beverages -Take any medication unless instructed by your physician -Make any legal decisions or  sign important papers.  Meals: Start with liquid foods such as gelatin or soup. Progress to regular foods as tolerated. Avoid greasy, spicy, heavy foods. If nausea and/or vomiting occur, drink only clear liquids until the nausea and/or vomiting subsides. Call your physician if vomiting continues.  Special Instructions/Symptoms: Your throat may feel dry or sore from the anesthesia or the breathing tube placed in your throat during surgery. If this causes discomfort, gargle with warm salt water. The discomfort should disappear within 24 hours.

## 2014-10-21 NOTE — Interval H&P Note (Signed)
History and Physical Interval Note:  10/21/2014 12:32 PM  Dominique Dillon  has presented today for surgery, with the diagnosis of left breast cancer  The various methods of treatment have been discussed with the patient and family. After consideration of risks, benefits and other options for treatment, the patient has consented to  Procedure(s): RE-EXCISION OF BREAST CANCER,SUPERIOR MARGINS (Left) INSERTION PORT-A-CATH (N/A) as a surgical intervention .  The patient's history has been reviewed, patient examined, no change in status, stable for surgery.  I have reviewed the patient's chart and labs.  Questions were answered to the patient's satisfaction.     [name]

## 2014-10-21 NOTE — Anesthesia Postprocedure Evaluation (Signed)
Anesthesia Post Note  Patient: Dominique Dillon  Procedure(s) Performed: Procedure(s) (LRB): INSERTION PORT-A-CATH (Right) RE-EXCISION OF BREAST CANCER, POSTERIOR MARGINS (Left)  Anesthesia type: General  Patient location: PACU  Post pain: Pain level controlled and Adequate analgesia  Post assessment: Post-op Vital signs reviewed, Patient's Cardiovascular Status Stable, Respiratory Function Stable, Patent Airway and Pain level controlled  Last Vitals:  Filed Vitals:   10/21/14 1530  BP:   Pulse: 94  Temp:   Resp: 16    Post vital signs: Reviewed and stable  Level of consciousness: awake, alert  and oriented  Complications: No apparent anesthesia complications

## 2014-10-21 NOTE — Transfer of Care (Signed)
Immediate Anesthesia Transfer of Care Note  Patient: Dominique Dillon  Procedure(s) Performed: Procedure(s): INSERTION PORT-A-CATH (Right) RE-EXCISION OF BREAST CANCER, POSTERIOR MARGINS (Left)  Patient Location: PACU  Anesthesia Type:General  Level of Consciousness: awake, sedated and patient cooperative  Airway & Oxygen Therapy: Patient Spontanous Breathing and Patient connected to face mask oxygen  Post-op Assessment: Report given to RN and Post -op Vital signs reviewed and stable  Post vital signs: Reviewed and stable  Last Vitals:  Filed Vitals:   10/21/14 0849  BP: 148/81  Pulse: 70  Temp: 36.8 C  Resp: 20    Complications: No apparent anesthesia complications

## 2014-10-21 NOTE — Anesthesia Preprocedure Evaluation (Signed)
Anesthesia Evaluation  Patient identified by MRN, date of birth, ID band Patient awake    Reviewed: Allergy & Precautions, NPO status , Patient's Chart, lab work & pertinent test results  Airway Mallampati: II   Neck ROM: full    Dental   Pulmonary asthma ,          Cardiovascular hypertension,     Neuro/Psych    GI/Hepatic GERD-  ,  Endo/Other  diabetes, Type 2, Insulin Dependentobese  Renal/GU      Musculoskeletal  (+) Arthritis -,   Abdominal   Peds  Hematology   Anesthesia Other Findings   Reproductive/Obstetrics                             Anesthesia Physical Anesthesia Plan  ASA: II  Anesthesia Plan: General   Post-op Pain Management:    Induction: Intravenous  Airway Management Planned: LMA  Additional Equipment:   Intra-op Plan:   Post-operative Plan:   Informed Consent: I have reviewed the patients History and Physical, chart, labs and discussed the procedure including the risks, benefits and alternatives for the proposed anesthesia with the patient or authorized representative who has indicated his/her understanding and acceptance.     Plan Discussed with: CRNA, Anesthesiologist and Surgeon  Anesthesia Plan Comments:         Anesthesia Quick Evaluation

## 2014-10-23 ENCOUNTER — Encounter (HOSPITAL_BASED_OUTPATIENT_CLINIC_OR_DEPARTMENT_OTHER): Payer: Self-pay | Admitting: General Surgery

## 2014-10-31 ENCOUNTER — Ambulatory Visit (HOSPITAL_BASED_OUTPATIENT_CLINIC_OR_DEPARTMENT_OTHER): Payer: BC Managed Care – PPO | Admitting: Oncology

## 2014-10-31 VITALS — BP 143/72 | HR 77 | Temp 98.6°F | Resp 18 | Ht 63.0 in | Wt 204.1 lb

## 2014-10-31 DIAGNOSIS — C50412 Malignant neoplasm of upper-outer quadrant of left female breast: Secondary | ICD-10-CM | POA: Diagnosis not present

## 2014-10-31 DIAGNOSIS — Z170 Estrogen receptor positive status [ER+]: Secondary | ICD-10-CM

## 2014-10-31 DIAGNOSIS — E119 Type 2 diabetes mellitus without complications: Secondary | ICD-10-CM

## 2014-10-31 DIAGNOSIS — D630 Anemia in neoplastic disease: Secondary | ICD-10-CM

## 2014-10-31 MED ORDER — DEXAMETHASONE 4 MG PO TABS: 8.0000 mg | ORAL_TABLET | Freq: Two times a day (BID) | ORAL | Status: DC

## 2014-10-31 MED ORDER — ONDANSETRON HCL 8 MG PO TABS: 8.0000 mg | ORAL_TABLET | Freq: Two times a day (BID) | ORAL | Status: DC

## 2014-10-31 MED ORDER — PROCHLORPERAZINE MALEATE 10 MG PO TABS
10.0000 mg | ORAL_TABLET | Freq: Four times a day (QID) | ORAL | Status: DC | PRN
Start: 2014-10-31 — End: 2015-02-24

## 2014-10-31 MED ORDER — LORAZEPAM 0.5 MG PO TABS: 0.5000 mg | ORAL_TABLET | Freq: Four times a day (QID) | ORAL | Status: DC | PRN

## 2014-10-31 MED ORDER — LIDOCAINE-PRILOCAINE 2.5-2.5 % EX CREA: TOPICAL_CREAM | CUTANEOUS | Status: DC

## 2014-10-31 NOTE — Progress Notes (Signed)
[facility]  [phone] [phone]     ID: Dominique Dillon DOB: [birth year]  MR#: [id number]  ZWC#:[id number]  Patient Care Team: Flora Lipps, FNP as PCP - General (Internal Medicine) Deland Pretty, MD as Referring Physician (Internal Medicine) Newton Pigg, MD as Consulting Physician (Obstetrics and Gynecology) Rolm Bookbinder, MD as Consulting Physician (General Surgery) Chauncey Cruel, MD as Consulting Physician (Oncology) Eppie Gibson, MD as Consulting Physician (Radiation Oncology) Rockwell Germany, RN as Registered Nurse Mauro Kaufmann, RN as Registered Nurse Holley Bouche, NP as Nurse Practitioner (Nurse Practitioner) OTHER MD:  CHIEF COMPLAINT: Triple negative breast cancer  CURRENT TREATMENT: Adjuvant chemotherapy  BREAST CANCER HISTORY: From the original intake note:  Dominique Dillon had bilateral screening mammography at [facility] 08/15/2014 showing a possible mass in the left breast. Left diagnostic mammography [facility] showed the breast density to be category B. There was a persistent spiculated mass in the upper outer quadrant of the left breast, which by ultrasonography measured 0.74 cm. Ultrasound of the left axilla was negative. Biopsy of the left breast mass in question 09/09/2014 showed ([id number]) and invasive ductal carcinoma, grade 2 or 3, E-cadherin positive, estrogen receptor 11% positive with weak staining intensity, progesterone receptor negative, HER-2 negative, with an MIB-1 of 35%.  The patient's subsequent history is as detailed below  INTERVAL HISTORY: Dominique Dillon returns today for follow-up of her triple negative breast cancer. Since her last visit here she underwent left lumpectomy and sentinel lymph node sampling 10/01/2014. This showed ([id number]) and invasive ductal carcinoma measuring 1.4 cm, grade 3. Repeat HER-2 was again negative with a signals ratio 1.28 and the  number per cell 2.50. The posterior margin was positive and was cleared with subsequent surgery 10/21/2014 ([id number]). She had a port placed at the same time.  A Mammaprint was obtained which shows the patient's tumor to be a basal type tumor high risk. This clearly warrants chemotherapy. Also the patient was offered genetics counseling but declined.   REVIEW OF SYSTEMS: There were no specific symptoms leading to the original mammogram, which was routinely scheduled. The patient denies unusual headaches, visual changes, nausea, vomiting, stiff neck, dizziness, or gait imbalance. There has been no cough, phlegm production, or pleurisy, no chest pain or pressure, and no change in bowel or bladder habits. The patient denies fever, rash, bleeding, unexplained fatigue or unexplained weight loss. Dominique Dillon does complain of some aches and pains here in there, which are not more intense or persistent or focal than usual. She sleeps on 2 pillows. She has a hiatal hernia. A detailed review of systems was otherwise entirely negative.  PAST MEDICAL HISTORY: Past Medical History  Diagnosis Date  . GERD (gastroesophageal reflux disease)   . Obesity   . Breast cancer of upper-outer quadrant of left female breast 09/12/2014  . History of anemia     still takes iron supplement  . Arthritis     knees  . Hypertension     under control with med., has been on med. since 1990s  . Insulin dependent diabetes mellitus   . Seasonal asthma     no current med.  . DDD (degenerative disc disease), cervical     PAST SURGICAL HISTORY: Past Surgical History  Procedure Laterality Date  . Cesarean section      x 2  . Knee arthroscopy Bilateral   . Abdominal hysterectomy      complete  . Cholecystectomy    .  Appendectomy    . Tonsillectomy    . Irrigation and debridement abscess N/A 03/08/2014    Procedure: IRRIGATION AND DEBRIDEMENT ABDOMINAL WALL ABSCESS;  Surgeon: Adin Hector, MD;  Location: [facility];  Service:  General;  Laterality: N/A;  . Colonoscopy    . Upper gi endoscopy    . Radioactive seed guided mastectomy with axillary sentinel lymph node biopsy Left 10/01/2014    Procedure: RADIOACTIVE SEED GUIDED LEFT BREAST LUMPECTOMY WITH LEFT  AXILLARY SENTINEL LYMPH NODE BIOPSY;  Surgeon: Rolm Bookbinder, MD;  Location: [facility];  Service: General;  Laterality: Left;  . Carpal tunnel release Right 08/06/2004  . Trigger finger release Right 08/06/2004    middle finger  . Portacath placement Right 10/21/2014    Procedure: INSERTION PORT-A-CATH;  Surgeon: Rolm Bookbinder, MD;  Location: [facility];  Service: General;  Laterality: Right;  . Re-excision of breast lumpectomy Left 10/21/2014    Procedure: RE-EXCISION OF BREAST CANCER, POSTERIOR MARGINS;  Surgeon: Rolm Bookbinder, MD;  Location: [facility];  Service: General;  Laterality: Left;    FAMILY HISTORY Family History  Problem Relation Age of Onset  . Prostate cancer Father   . Stomach cancer Maternal Aunt   . Pancreatic cancer Maternal Uncle   . Lung cancer Maternal Grandmother   . Breast cancer Maternal Aunt   . Pancreatic cancer Maternal Aunt   . Pancreatic cancer Maternal Uncle    there is significant stomach (one case) and pancreatic cancer (3 cases) on the maternal side. There is also a history of breast cancer on the maternal side(1 and diagnosed at age 10). The patient's father died the age of 15 with prostate cancer  GYNECOLOGIC HISTORY:  No LMP recorded. Patient has had a hysterectomy. Menarche age 50, first live birth age 76, the patient is G X P2. She underwent hysterectomy with salpingo-oophorectomy in 1995. She did not take hormone replacement.  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 She is widowed. At home she lives with her adopted daughter Dominique Dillon. The patient's 2 biologic daughter are Dominique Dillon who works as a Recruitment consultant and Dominique Dillon who works as a Land, both in [place]. The patient also has a goddaughter, Dominique Dillon, whom she help raise and whom she considers as a daughter    ADVANCED DIRECTIVES: Not in place. At her to 05/29/2015 visit the patient was given the appropriate documents 2 complete and notarize at her discretion. She tells me she intends to name her daughter [name] as her healthcare power of attorney. 68 can be reached at [place]: History  Substance Use Topics  . Smoking status: Never Smoker   . Smokeless tobacco: Never Used  . Alcohol Use: No     Colonoscopy: [facility], 2011  PAP:  Bone density: Remote  Lipid panel:  Allergies  Allergen Reactions  . Biaxin [Clarithromycin] Other (See Comments)    UNKNOWN    Current Outpatient Prescriptions  Medication Sig Dispense Refill  . aspirin 81 MG tablet Take 81 mg by mouth daily.    . Calcium Carbonate (CALCIUM 600 PO) Take by mouth.    . cetirizine (ZYRTEC) 10 MG chewable tablet Chew 10 mg by mouth daily.    . Cholecalciferol (VITAMIN D) 1000 UNITS capsule Take 1,000 Units by mouth daily.    . ferrous sulfate 325 (65 FE) MG tablet Take 325 mg by mouth daily with breakfast.    .  hydrochlorothiazide (HYDRODIURIL) 25 MG tablet Take 25 mg by mouth every other day.     . insulin lispro protamine-lispro (HUMALOG 75/25 MIX) (75-25) 100 UNIT/ML SUSP injection Inject 30 Units into the skin 2 (two) times daily with a meal.    . losartan (COZAAR) 50 MG tablet Take 50 mg by mouth daily.    . metFORMIN (GLUCOPHAGE) 1000 MG tablet Take 1,000 mg by mouth 2 (two) times daily with a meal.    . Omega-3 Fatty Acids (FISH OIL PO) Take by mouth.    . oxyCODONE-acetaminophen (PERCOCET) 10-325 MG per tablet Take 1 tablet by mouth every 6 (six) hours as needed for pain. 20 tablet 0  . pantoprazole (PROTONIX) 40 MG tablet Take 40 mg by mouth daily.     No current facility-administered medications for this visit.    OBJECTIVE:  Middle-aged African-American woman who appears stated age 1 Vitals:   10/31/14 1019  BP: 143/72  Pulse: 77  Temp: 98.6 F (37 C)  Resp: 18     Body mass index is 36.16 kg/(m^2).    ECOG FS:0 - Asymptomatic  Ocular: Sclerae unicteric, pupils round and equal Ear-nose-throat: Oropharynx clear and moist Lymphatic: No cervical or supraclavicular adenopathy Lungs no rales or rhonchi, good excursion bilaterally Heart regular rate and rhythm, no murmur appreciated Abd soft, nontender, positive bowel sounds MSK no focal spinal tenderness, no joint edema Neuro: non-focal, well-oriented, appropriate affect Breasts: The right breast is unremarkable. The left breast is status post recent biopsy. I do not palpate a well-defined mass. There are no skin or nipple changes of concern. The right axilla is benign.   LAB RESULTS:  CMP     Component Value Date/Time   NA 142 10/16/2014 0848   NA 140 09/18/2014 1155   K 3.9 10/16/2014 0848   K 3.8 09/18/2014 1155   CL 108 10/16/2014 0848   CO2 29 10/16/2014 0848   CO2 24 09/18/2014 1155   GLUCOSE 155* 10/16/2014 0848   GLUCOSE 248* 09/18/2014 1155   BUN 11 10/16/2014 0848   BUN 11.9 09/18/2014 1155   CREATININE 0.73 10/16/2014 0848   CREATININE 0.9 09/18/2014 1155   CALCIUM 9.2 10/16/2014 0848   CALCIUM 9.6 09/18/2014 1155   PROT 7.5 09/18/2014 1155   PROT 7.1 03/08/2014 0500   ALBUMIN 4.0 09/18/2014 1155   ALBUMIN 3.1* 03/08/2014 0500   AST 16 09/18/2014 1155   AST 12 03/08/2014 0500   ALT 12 09/18/2014 1155   ALT 8 03/08/2014 0500   ALKPHOS 99 09/18/2014 1155   ALKPHOS 90 03/08/2014 0500   BILITOT 0.66 09/18/2014 1155   BILITOT 0.6 03/08/2014 0500   GFRNONAA >90 10/16/2014 0848   GFRAA >90 10/16/2014 0848    INo results found for: SPEP, UPEP  Lab Results  Component Value Date   WBC 6.8 09/18/2014   NEUTROABS 4.0 09/18/2014   HGB 11.8 09/18/2014   HCT 37.6 09/18/2014   MCV 73.5* 09/18/2014   PLT 347 09/18/2014       Chemistry      Component Value Date/Time   NA 142 10/16/2014 0848   NA 140 09/18/2014 1155   K 3.9 10/16/2014 0848   K 3.8 09/18/2014 1155   CL 108 10/16/2014 0848   CO2 29 10/16/2014 0848   CO2 24 09/18/2014 1155   BUN 11 10/16/2014 0848   BUN 11.9 09/18/2014 1155   CREATININE 0.73 10/16/2014 0848   CREATININE 0.9 09/18/2014 1155      Component  Value Date/Time   CALCIUM 9.2 10/16/2014 0848   CALCIUM 9.6 09/18/2014 1155   ALKPHOS 99 09/18/2014 1155   ALKPHOS 90 03/08/2014 0500   AST 16 09/18/2014 1155   AST 12 03/08/2014 0500   ALT 12 09/18/2014 1155   ALT 8 03/08/2014 0500   BILITOT 0.66 09/18/2014 1155   BILITOT 0.6 03/08/2014 0500       No results found for: LABCA2  No components found for: LABCA125  No results for input(s): INR in the last 168 hours.  Urinalysis    Component Value Date/Time   COLORURINE YELLOW 01/14/2007 0708   APPEARANCEUR CLEAR 01/14/2007 0708   LABSPEC 1.017 01/14/2007 0708   PHURINE 6.0 01/14/2007 0708   GLUCOSEU NEGATIVE 01/14/2007 0708   HGBUR NEGATIVE 01/14/2007 0708   BILIRUBINUR NEGATIVE 01/14/2007 0708   KETONESUR NEGATIVE 01/14/2007 0708   PROTEINUR NEGATIVE 01/14/2007 0708   UROBILINOGEN 0.2 01/14/2007 0708   NITRITE NEGATIVE 01/14/2007 0708   LEUKOCYTESUR SMALL* 01/14/2007 0708    STUDIES: Dg Chest Port 1 View  10/21/2014   CLINICAL DATA:  Status post Port-A-Cath placement today. Breast cancer.  EXAM: PORTABLE CHEST - 1 VIEW  COMPARISON:  PA and lateral chest 07/28/2009.  FINDINGS: Right subclavian approach Port-A-Cath is in place with the tip projecting over the mid to lower superior vena cava. No pneumothorax identified. Lung volumes are low but the lungs are clear. Gas in the left breast and surgical clips are noted.  IMPRESSION: Tip of right IJ approach Port-A-Cath projects over the mid to lower superior vena cava. No pneumothorax identified.   Electronically Signed   By: Inge Rise M.D.   On: 10/21/2014 14:33   Dg  Fluoro Guide Cv Line-no Report  10/21/2014   CLINICAL DATA:    FLOURO GUIDE CV LINE  Fluoroscopy was utilized by the requesting physician.  No radiographic  interpretation.     ASSESSMENT: 59 y.o. [place] woman status post left breast upper outer quadrant biopsy 09/09/2014 for a clinical T1b N0, stage IA invasive ductal carcinoma, grade 2 or 3, weakly estrogen receptor positive, progesterone receptor and HER-2 negative, with an MIB-1 of 35%  (1) status post left lumpectomy and sentinel lymph node sampling 10/01/2014 for a pT1c pN0, stage IA invasive ductal carcinoma, grade 3, repeat HER-2 again negative.  (a) positive posterior margin cleared with subsequent excision 10/21/2014  (2) Mammaprint shows a basal type tumor which is clearly high risk. It confirms that the tumor is the triple negative   (3) adjuvant chemotherapy will consist of cyclophosphamide and docetaxel 4, with Neulasta support  (4) adjuvant radiation to follow chemotherapy  (5) the patient has declined genetics testing   PLAN:  I spent approximately one hour today with the patient going over her situation. I reviewed her family history and encouraged her to get genetically tested, but she feels "I don't want to get into too much information at this point.". Accordingly genetics testing appointment has been canceled.  I then went over her pathology, which shows a small but aggressive tumor, which is not going to be responsive to either anti-estrogens or anti-HER-2 treatment. Her only option for systemic therapy is chemotherapy. She thought if she had both breasts removed she would not need chemotherapy and we again reviewed the difference between local and systemic disease and the fact that she might have hidden cancer in her liver or lungs or bones which   would not be taken care of by having bilateral mastectomies. That is why she  is going to have chemotherapy.  We then discussed the agents she would receive, namely Cytoxan  and Taxotere. There is a real concern that she will have a significant problems with her diabetes and therefore I am modifying the supportive meds essentially to eliminate dexamethasone except on the day of treatment itself. She will use ondansetron and prochlorperazine for nausea. She will not do the day 0 dexamethasone treatment. I am hopeful that by making these adjustments her blood sugar will not be totally uncontrolled.  I put in prescriptions for all her medications and gave her a prescription for the lorazepam, which could not be prescribed. I gave her "roadmap" explaining how to take all her medications and went over it with her in detail. I also set her up for a "chemotherapy school" appointment next week. I entered her follow-up appointments as well.   She is planning to go on temporary leave during the chemotherapy. I suggested she apply for temporary disability.  We will do our best to get Saroya through her 4 cycles of chemotherapy with a minimum of complications. After that she will proceed to radiation. She has a good understanding of the overall plan. She agrees with it. She knows the goal of treatment in her case is cure. She will call with any problems that may develop before her next visit here.  Chauncey Cruel, MD   10/31/2014 10:26 AM Medical Oncology and Hematology [facility] [address] [place],  [address] Tel. [phone]    Fax. [phone]  Activity or

## 2014-11-03 ENCOUNTER — Other Ambulatory Visit: Payer: Self-pay | Admitting: Oncology

## 2014-11-04 ENCOUNTER — Telehealth: Payer: Self-pay | Admitting: *Deleted

## 2014-11-04 NOTE — Telephone Encounter (Signed)
Per staff message and POF I have scheduled appts. Advised scheduler of appts. JMW  

## 2014-11-05 ENCOUNTER — Ambulatory Visit: Payer: BC Managed Care – PPO

## 2014-11-05 ENCOUNTER — Ambulatory Visit: Payer: BC Managed Care – PPO | Admitting: Radiation Oncology

## 2014-11-07 ENCOUNTER — Encounter: Payer: Self-pay | Admitting: *Deleted

## 2014-11-07 ENCOUNTER — Other Ambulatory Visit: Payer: BC Managed Care – PPO

## 2014-11-07 ENCOUNTER — Other Ambulatory Visit (HOSPITAL_BASED_OUTPATIENT_CLINIC_OR_DEPARTMENT_OTHER): Payer: BC Managed Care – PPO

## 2014-11-07 DIAGNOSIS — C50412 Malignant neoplasm of upper-outer quadrant of left female breast: Secondary | ICD-10-CM

## 2014-11-07 LAB — CBC WITH DIFFERENTIAL/PLATELET
BASO%: 0.7 % (ref 0.0–2.0)
Basophils Absolute: 0 10e3/uL (ref 0.0–0.1)
EOS%: 7 % (ref 0.0–7.0)
Eosinophils Absolute: 0.5 10e3/uL (ref 0.0–0.5)
HCT: 34.3 % — ABNORMAL LOW (ref 34.8–46.6)
HGB: 10.9 g/dL — ABNORMAL LOW (ref 11.6–15.9)
LYMPH%: 32.6 % (ref 14.0–49.7)
MCH: 23.2 pg — ABNORMAL LOW (ref 25.1–34.0)
MCHC: 31.8 g/dL (ref 31.5–36.0)
MCV: 72.9 fL — ABNORMAL LOW (ref 79.5–101.0)
MONO#: 0.6 10e3/uL (ref 0.1–0.9)
MONO%: 8.6 % (ref 0.0–14.0)
NEUT#: 3.4 10e3/uL (ref 1.5–6.5)
NEUT%: 51.1 % (ref 38.4–76.8)
Platelets: 302 10e3/uL (ref 145–400)
RBC: 4.7 10e6/uL (ref 3.70–5.45)
RDW: 15.5 % — ABNORMAL HIGH (ref 11.2–14.5)
WBC: 6.6 10e3/uL (ref 3.9–10.3)
lymph#: 2.2 10e3/uL (ref 0.9–3.3)

## 2014-11-07 LAB — COMPREHENSIVE METABOLIC PANEL (CC13)
ALBUMIN: 3.6 g/dL (ref 3.5–5.0)
ALT: 15 U/L (ref 0–55)
AST: 19 U/L (ref 5–34)
Alkaline Phosphatase: 104 U/L (ref 40–150)
Anion Gap: 11 mEq/L (ref 3–11)
BUN: 11.2 mg/dL (ref 7.0–26.0)
CO2: 24 mEq/L (ref 22–29)
Calcium: 9.3 mg/dL (ref 8.4–10.4)
Chloride: 107 mEq/L (ref 98–109)
Creatinine: 0.8 mg/dL (ref 0.6–1.1)
EGFR: 87 mL/min/{1.73_m2} — ABNORMAL LOW (ref >90)
Glucose: 136 mg/dl (ref 70–140)
POTASSIUM: 3.9 meq/L (ref 3.5–5.1)
Sodium: 143 mEq/L (ref 136–145)
Total Bilirubin: 0.62 mg/dL (ref 0.20–1.20)
Total Protein: 7.2 g/dL (ref 6.4–8.3)

## 2014-11-09 NOTE — Progress Notes (Signed)
[facility]  [phone] [phone]     ID: Dominique Dillon DOB: 1955-12-07  MR#: [id number]  KDX#:[id number]  Patient Care Team: Flora Lipps, FNP as PCP - General (Internal Medicine) Deland Pretty, MD as Referring Physician (Internal Medicine) Newton Pigg, MD as Consulting Physician (Obstetrics and Gynecology) Rolm Bookbinder, MD as Consulting Physician (General Surgery) Chauncey Cruel, MD as Consulting Physician (Oncology) Eppie Gibson, MD as Consulting Physician (Radiation Oncology) Rockwell Germany, RN as Registered Nurse Mauro Kaufmann, RN as Registered Nurse Holley Bouche, NP as Nurse Practitioner (Nurse Practitioner) OTHER MD:  CHIEF COMPLAINT: Triple negative breast cancer  CURRENT TREATMENT: Adjuvant chemotherapy  BREAST CANCER HISTORY: From the original intake note:  Dominique Dillon had bilateral screening mammography at [facility] 08/15/2014 showing a possible mass in the left breast. Left diagnostic mammography [facility] showed the breast density to be category B. There was a persistent spiculated mass in the upper outer quadrant of the left breast, which by ultrasonography measured 0.74 cm. Ultrasound of the left axilla was negative. Biopsy of the left breast mass in question 09/09/2014 showed ([id number]) and invasive ductal carcinoma, grade 2 or 3, E-cadherin positive, estrogen receptor 11% positive with weak staining intensity, progesterone receptor negative, HER-2 negative, with an MIB-1 of 35%.  The patient's subsequent history is as detailed below  INTERVAL HISTORY: Dominique Dillon returns today for follow-up of her triple negative breast cancer accompanied by her daughter. Today is day 1 cycle 1 of 4 planned cycles of cyclophosphamide and docetaxel, with Neulasta support on day 2  REVIEW OF SYSTEMS: She very carefully reviewed all her medication instructions and found some discrepancies  which we reviewed today. She did take her dexamethasone yesterday. It raised your blood sugar to the 200 level. She had tested her insulin as a result. She has a little bit of a headache, and some sinus symptoms. She has mild arthritis symptoms which are not more pronounced or persistent than before. She did not have insomnia from the dexamethasone. A detailed review of systems today was otherwise stable.  PAST MEDICAL HISTORY: Past Medical History  Diagnosis Date  . GERD (gastroesophageal reflux disease)   . Obesity   . Breast cancer of upper-outer quadrant of left female breast 09/12/2014  . History of anemia     still takes iron supplement  . Arthritis     knees  . Hypertension     under control with med., has been on med. since 1990s  . Insulin dependent diabetes mellitus   . Seasonal asthma     no current med.  . DDD (degenerative disc disease), cervical     PAST SURGICAL HISTORY: Past Surgical History  Procedure Laterality Date  . Cesarean section      x 2  . Knee arthroscopy Bilateral   . Abdominal hysterectomy      complete  . Cholecystectomy    . Appendectomy    . Tonsillectomy    . Irrigation and debridement abscess N/A 03/08/2014    Procedure: IRRIGATION AND DEBRIDEMENT ABDOMINAL WALL ABSCESS;  Surgeon: Adin Hector, MD;  Location: [facility];  Service: General;  Laterality: N/A;  . Colonoscopy    . Upper gi endoscopy    . Radioactive seed guided mastectomy with axillary sentinel lymph node biopsy Left 10/01/2014    Procedure: RADIOACTIVE SEED GUIDED LEFT BREAST LUMPECTOMY WITH LEFT  AXILLARY SENTINEL LYMPH NODE BIOPSY;  Surgeon: Rolm Bookbinder, MD;  Location: [facility];  Service: General;  Laterality: Left;  . Carpal tunnel release Right 08/06/2004  . Trigger finger release Right 08/06/2004    middle finger  . Portacath placement Right 10/21/2014    Procedure: INSERTION PORT-A-CATH;  Surgeon: Rolm Bookbinder, MD;  Location: [place] [facility];  Service: General;  Laterality: Right;  . Re-excision of breast lumpectomy Left 10/21/2014    Procedure: RE-EXCISION OF BREAST CANCER, POSTERIOR MARGINS;  Surgeon: Rolm Bookbinder, MD;  Location: [place];  Service: General;  Laterality: Left;    FAMILY HISTORY Family History  Problem Relation Age of Onset  . Prostate cancer Father   . Stomach cancer Maternal Aunt   . Pancreatic cancer Maternal Uncle   . Lung cancer Maternal Grandmother   . Breast cancer Maternal Aunt   . Pancreatic cancer Maternal Aunt   . Pancreatic cancer Maternal Uncle    there is significant stomach (one case) and pancreatic cancer (3 cases) on the maternal side. There is also a history of breast cancer on the maternal side(1 and diagnosed at age 47). The patient's father died the age of 59 with prostate cancer  GYNECOLOGIC HISTORY:  No LMP recorded. Patient has had a hysterectomy. Menarche age 65, first live birth age 65, the patient is G X P2. She underwent hysterectomy with salpingo-oophorectomy in 1995. She did not take hormone replacement.  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 She is widowed. At home she lives with her adopted daughter Macarena Langseth. The patient's 2 biologic daughter are Shalayah Beagley who works as a Recruitment consultant and Yeily Link who works as a Land, both in [place]. The patient also has a goddaughter, [name], whom she help raise and whom she considers as a daughter    ADVANCED DIRECTIVES: Not in place. At her to 05/29/2015 visit the patient was given the appropriate documents 2 complete and notarize at her discretion. She tells me she intends to name her daughter [name] as her healthcare power of attorney. 68 can be reached at [place]: History  Substance Use Topics  . Smoking status: Never Smoker   . Smokeless tobacco: Never Used  . Alcohol Use: No     Colonoscopy: [facility],  2011  PAP:  Bone density: Remote  Lipid panel:  Allergies  Allergen Reactions  . Biaxin [Clarithromycin] Other (See Comments)    UNKNOWN    Current Outpatient Prescriptions  Medication Sig Dispense Refill  . aspirin 81 MG tablet Take 81 mg by mouth daily.    . Calcium Carbonate (CALCIUM 600 PO) Take by mouth.    . cetirizine (ZYRTEC) 10 MG chewable tablet Chew 10 mg by mouth daily.    . Cholecalciferol (VITAMIN D) 1000 UNITS capsule Take 1,000 Units by mouth daily.    Marland Kitchen dexamethasone (DECADRON) 4 MG tablet Take 2 tablets (8 mg total) by mouth 2 (two) times daily. Start the day before Taxotere. Then again the day after chemo for 3 days. 30 tablet 1  . ferrous sulfate 325 (65 FE) MG tablet Take 325 mg by mouth daily with breakfast.    . hydrochlorothiazide (HYDRODIURIL) 25 MG tablet Take 25 mg by mouth every other day.     . insulin lispro protamine-lispro (HUMALOG 75/25 MIX) (75-25) 100 UNIT/ML SUSP injection Inject 30 Units into the skin 2 (two) times daily with a meal.    . lidocaine-prilocaine (EMLA) cream Apply to affected area once 30 g  3  . LORazepam (ATIVAN) 0.5 MG tablet Take 1 tablet (0.5 mg total) by mouth every 6 (six) hours as needed (Nausea or vomiting). 30 tablet 0  . losartan (COZAAR) 50 MG tablet Take 50 mg by mouth daily.    . metFORMIN (GLUCOPHAGE) 1000 MG tablet Take 1,000 mg by mouth 2 (two) times daily with a meal.    . Omega-3 Fatty Acids (FISH OIL PO) Take by mouth.    . ondansetron (ZOFRAN) 8 MG tablet Take 1 tablet (8 mg total) by mouth 2 (two) times daily. Start the day after chemo for 3 days. Then take as needed for nausea or vomiting. 30 tablet 1  . oxyCODONE-acetaminophen (PERCOCET) 10-325 MG per tablet Take 1 tablet by mouth every 6 (six) hours as needed for pain. 20 tablet 0  . pantoprazole (PROTONIX) 40 MG tablet Take 40 mg by mouth daily.    . prochlorperazine (COMPAZINE) 10 MG tablet Take 1 tablet (10 mg total) by mouth every 6 (six) hours as needed  (Nausea or vomiting). 30 tablet 1   No current facility-administered medications for this visit.    OBJECTIVE: Middle-aged African-American woman in no acute distress Filed Vitals:   11/11/14 0936  BP: 151/76  Pulse: 87  Temp: 98.3 F (36.8 C)  Resp: 18     Body mass index is 36.52 kg/(m^2).    ECOG FS:0 - Asymptomatic  Ocular: Sclerae unicteric, EOMs intact Ear-nose-throat: Oropharynx clear, dentition in fair repair Lymphatic: No cervical or supraclavicular adenopathy Lungs no rales or rhonchi, good excursion bilaterally Heart regular rate and rhythm, no murmur appreciated Abd soft, obese, nontender, positive bowel sounds MSK no focal spinal tenderness, no upper extremity lymphedema Neuro: non-focal, well-oriented, positive affect Breasts: Deferred  LAB RESULTS:  CMP     Component Value Date/Time   NA 141 11/11/2014 0922   NA 142 10/16/2014 0848   K 4.1 11/11/2014 0922   K 3.9 10/16/2014 0848   CL 108 10/16/2014 0848   CO2 20* 11/11/2014 0922   CO2 29 10/16/2014 0848   GLUCOSE 253* 11/11/2014 0922   GLUCOSE 155* 10/16/2014 0848   BUN 17.4 11/11/2014 0922   BUN 11 10/16/2014 0848   CREATININE 0.8 11/11/2014 0922   CREATININE 0.73 10/16/2014 0848   CALCIUM 10.1 11/11/2014 0922   CALCIUM 9.2 10/16/2014 0848   PROT 8.0 11/11/2014 0922   PROT 7.1 03/08/2014 0500   ALBUMIN 4.0 11/11/2014 0922   ALBUMIN 3.1* 03/08/2014 0500   AST 15 11/11/2014 0922   AST 12 03/08/2014 0500   ALT 13 11/11/2014 0922   ALT 8 03/08/2014 0500   ALKPHOS 106 11/11/2014 0922   ALKPHOS 90 03/08/2014 0500   BILITOT 0.41 11/11/2014 0922   BILITOT 0.6 03/08/2014 0500   GFRNONAA >90 10/16/2014 0848   GFRAA >90 10/16/2014 0848    INo results found for: SPEP, UPEP  Lab Results  Component Value Date   WBC 10.2 11/11/2014   NEUTROABS 9.0* 11/11/2014   HGB 12.1 11/11/2014   HCT 37.3 11/11/2014   MCV 73.3* 11/11/2014   PLT 358 11/11/2014      Chemistry      Component Value Date/Time    NA 141 11/11/2014 0922   NA 142 10/16/2014 0848   K 4.1 11/11/2014 0922   K 3.9 10/16/2014 0848   CL 108 10/16/2014 0848   CO2 20* 11/11/2014 0922   CO2 29 10/16/2014 0848   BUN 17.4 11/11/2014 0922   BUN 11 10/16/2014 0848  CREATININE 0.8 11/11/2014 0922   CREATININE 0.73 10/16/2014 0848      Component Value Date/Time   CALCIUM 10.1 11/11/2014 0922   CALCIUM 9.2 10/16/2014 0848   ALKPHOS 106 11/11/2014 0922   ALKPHOS 90 03/08/2014 0500   AST 15 11/11/2014 0922   AST 12 03/08/2014 0500   ALT 13 11/11/2014 0922   ALT 8 03/08/2014 0500   BILITOT 0.41 11/11/2014 0922   BILITOT 0.6 03/08/2014 0500       No results found for: LABCA2  No components found for: SUPJS315  No results for input(s): INR in the last 168 hours.  Urinalysis    Component Value Date/Time   COLORURINE YELLOW 01/14/2007 0708   APPEARANCEUR CLEAR 01/14/2007 0708   LABSPEC 1.017 01/14/2007 0708   PHURINE 6.0 01/14/2007 0708   GLUCOSEU NEGATIVE 01/14/2007 0708   HGBUR NEGATIVE 01/14/2007 0708   BILIRUBINUR NEGATIVE 01/14/2007 0708   KETONESUR NEGATIVE 01/14/2007 0708   PROTEINUR NEGATIVE 01/14/2007 0708   UROBILINOGEN 0.2 01/14/2007 0708   NITRITE NEGATIVE 01/14/2007 0708   LEUKOCYTESUR SMALL* 01/14/2007 0708    STUDIES: Dg Chest Port 1 View  10/21/2014   CLINICAL DATA:  Status post Port-A-Cath placement today. Breast cancer.  EXAM: PORTABLE CHEST - 1 VIEW  COMPARISON:  PA and lateral chest 07/28/2009.  FINDINGS: Right subclavian approach Port-A-Cath is in place with the tip projecting over the mid to lower superior vena cava. No pneumothorax identified. Lung volumes are low but the lungs are clear. Gas in the left breast and surgical clips are noted.  IMPRESSION: Tip of right IJ approach Port-A-Cath projects over the mid to lower superior vena cava. No pneumothorax identified.   Electronically Signed   By: Inge Rise M.D.   On: 10/21/2014 14:33   Dg Fluoro Guide Cv Line-no  Report  10/21/2014   CLINICAL DATA:    FLOURO GUIDE CV LINE  Fluoroscopy was utilized by the requesting physician.  No radiographic  interpretation.     ASSESSMENT: 59 y.o. [place] woman status post left breast upper outer quadrant biopsy 09/09/2014 for a clinical T1b N0, stage IA invasive ductal carcinoma, grade 2 or 3, weakly estrogen receptor positive, progesterone receptor and HER-2 negative, with an MIB-1 of 35%  (1) status post left lumpectomy and sentinel lymph node sampling 10/01/2014 for a pT1c pN0, stage IA invasive ductal carcinoma, grade 3, repeat HER-2 again negative.  (a) positive posterior margin cleared with subsequent excision 10/21/2014  (2) Mammaprint shows a basal type tumor which is clearly high risk. It confirms that the tumor is the triple negative   (3) adjuvant chemotherapy starting 11/11/2014 consisting of cyclophosphamide and docetaxel 4, with Neulasta support  (4) adjuvant radiation to follow chemotherapy  (5) the patient has declined genetics testing   PLAN:  Dominique Dillon is starting her chemotherapy today. She tolerated the dexamethasone well yesterday, with some increase in her blood sugars. She will receive dexamethasone intravenously today, but we are not using that on days 23 and 4 as we normally do. Instead she will rely on ondansetron and prochlorperazine for nausea control.  Surprisingly she did not have insomnia from the dexamethasone. If that continues to be the case she does not need to take lorazepam.  The dexamethasone will increase her sugars. She is watching that very carefully in making the appropriate adjustments in her insulin dosage  I asked her to keep a record of her symptoms so we can discuss it next week and troubleshoot any problems that may develop.  She has a good understanding of the overall plan. She agrees with it. She knows a goal of treatment in her case is cure. She will call with any problems that may develop before her next  visit here.  Chauncey Cruel, MD   11/11/2014 10:28 AM Medical Oncology and Hematology [facility] [address] [place], [place] [address] Tel. [phone]    Fax. [phone]

## 2014-11-11 ENCOUNTER — Ambulatory Visit (HOSPITAL_BASED_OUTPATIENT_CLINIC_OR_DEPARTMENT_OTHER): Payer: BC Managed Care – PPO | Admitting: Oncology

## 2014-11-11 ENCOUNTER — Ambulatory Visit (HOSPITAL_BASED_OUTPATIENT_CLINIC_OR_DEPARTMENT_OTHER): Payer: BC Managed Care – PPO

## 2014-11-11 ENCOUNTER — Telehealth: Payer: Self-pay | Admitting: Oncology

## 2014-11-11 ENCOUNTER — Encounter: Payer: Self-pay | Admitting: *Deleted

## 2014-11-11 ENCOUNTER — Ambulatory Visit: Payer: BC Managed Care – PPO | Admitting: Oncology

## 2014-11-11 ENCOUNTER — Other Ambulatory Visit (HOSPITAL_BASED_OUTPATIENT_CLINIC_OR_DEPARTMENT_OTHER): Payer: BC Managed Care – PPO

## 2014-11-11 ENCOUNTER — Other Ambulatory Visit: Payer: BC Managed Care – PPO

## 2014-11-11 VITALS — BP 151/76 | HR 87 | Temp 98.3°F | Resp 18 | Ht 63.0 in | Wt 206.1 lb

## 2014-11-11 DIAGNOSIS — C50412 Malignant neoplasm of upper-outer quadrant of left female breast: Secondary | ICD-10-CM | POA: Diagnosis not present

## 2014-11-11 DIAGNOSIS — Z170 Estrogen receptor positive status [ER+]: Secondary | ICD-10-CM | POA: Diagnosis not present

## 2014-11-11 DIAGNOSIS — Z5111 Encounter for antineoplastic chemotherapy: Secondary | ICD-10-CM | POA: Diagnosis not present

## 2014-11-11 DIAGNOSIS — D630 Anemia in neoplastic disease: Secondary | ICD-10-CM

## 2014-11-11 LAB — CBC WITH DIFFERENTIAL/PLATELET
BASO%: 0.6 % (ref 0.0–2.0)
Basophils Absolute: 0.1 10*3/uL (ref 0.0–0.1)
EOS%: 0.4 % (ref 0.0–7.0)
Eosinophils Absolute: 0 10*3/uL (ref 0.0–0.5)
HCT: 37.3 % (ref 34.8–46.6)
HGB: 12.1 g/dL (ref 11.6–15.9)
LYMPH%: 9.4 % — AB (ref 14.0–49.7)
MCH: 23.8 pg — AB (ref 25.1–34.0)
MCHC: 32.5 g/dL (ref 31.5–36.0)
MCV: 73.3 fL — ABNORMAL LOW (ref 79.5–101.0)
MONO#: 0.1 10*3/uL (ref 0.1–0.9)
MONO%: 1.3 % (ref 0.0–14.0)
NEUT#: 9 10*3/uL — ABNORMAL HIGH (ref 1.5–6.5)
NEUT%: 88.3 % — ABNORMAL HIGH (ref 38.4–76.8)
PLATELETS: 358 10*3/uL (ref 145–400)
RBC: 5.09 10*6/uL (ref 3.70–5.45)
RDW: 15.4 % — AB (ref 11.2–14.5)
WBC: 10.2 10*3/uL (ref 3.9–10.3)
lymph#: 1 10*3/uL (ref 0.9–3.3)

## 2014-11-11 LAB — COMPREHENSIVE METABOLIC PANEL (CC13)
ALT: 13 U/L (ref 0–55)
ANION GAP: 17 meq/L — AB (ref 3–11)
AST: 15 U/L (ref 5–34)
Albumin: 4 g/dL (ref 3.5–5.0)
Alkaline Phosphatase: 106 U/L (ref 40–150)
BUN: 17.4 mg/dL (ref 7.0–26.0)
CALCIUM: 10.1 mg/dL (ref 8.4–10.4)
CO2: 20 mEq/L — ABNORMAL LOW (ref 22–29)
Chloride: 104 mEq/L (ref 98–109)
Creatinine: 0.8 mg/dL (ref 0.6–1.1)
EGFR: 87 mL/min/{1.73_m2} — AB (ref >90)
GLUCOSE: 253 mg/dL — AB (ref 70–140)
Potassium: 4.1 mEq/L (ref 3.5–5.1)
SODIUM: 141 meq/L (ref 136–145)
Total Bilirubin: 0.41 mg/dL (ref 0.20–1.20)
Total Protein: 8 g/dL (ref 6.4–8.3)

## 2014-11-11 MED ORDER — DOCETAXEL CHEMO INJECTION 160 MG/16ML
75.0000 mg/m2 | Freq: Once | INTRAVENOUS | Status: AC
  Administered 2014-11-11: 150 mg via INTRAVENOUS
  Filled 2014-11-11: qty 15

## 2014-11-11 MED ORDER — SODIUM CHLORIDE 0.9 % IJ SOLN
10.0000 mL | INTRAMUSCULAR | Status: DC | PRN
  Administered 2014-11-11: 10 mL
  Filled 2014-11-11: qty 10

## 2014-11-11 MED ORDER — SODIUM CHLORIDE 0.9 % IV SOLN
600.0000 mg/m2 | Freq: Once | INTRAVENOUS | Status: AC
  Administered 2014-11-11: 1220 mg via INTRAVENOUS
  Filled 2014-11-11: qty 61

## 2014-11-11 MED ORDER — SODIUM CHLORIDE 0.9 % IV SOLN
Freq: Once | INTRAVENOUS | Status: AC
  Administered 2014-11-11: 11:00:00 via INTRAVENOUS

## 2014-11-11 MED ORDER — HEPARIN SOD (PORK) LOCK FLUSH 100 UNIT/ML IV SOLN
500.0000 [IU] | Freq: Once | INTRAVENOUS | Status: AC | PRN
  Administered 2014-11-11: 500 [IU]
  Filled 2014-11-11: qty 5

## 2014-11-11 MED ORDER — SODIUM CHLORIDE 0.9 % IV SOLN
Freq: Once | INTRAVENOUS | Status: AC
  Administered 2014-11-11: 11:00:00 via INTRAVENOUS
  Filled 2014-11-11: qty 8

## 2014-11-11 NOTE — Progress Notes (Signed)
Met with pt during 1st chemotherapy treatment. Related doing well. Denies needs at this time. Encourage pt to call with questions or concerns. Received verbal understanding.

## 2014-11-11 NOTE — Telephone Encounter (Signed)
Appointments for 4/7 altered to 4/5 and a new avs was printed for the patient

## 2014-11-11 NOTE — Patient Instructions (Signed)
[facility] Discharge Instructions for Patients Receiving Chemotherapy  Today you received the following chemotherapy agents TAXOTERE AND CYTOXAN To help prevent nausea and vomiting after your treatment, we encourage you to take your nausea medication as prescribed  If you develop nausea and vomiting that is not controlled by your nausea medication, call the clinic.   BELOW ARE SYMPTOMS THAT SHOULD BE REPORTED IMMEDIATELY:  *FEVER GREATER THAN 100.5 F  *CHILLS WITH OR WITHOUT FEVER  NAUSEA AND VOMITING THAT IS NOT CONTROLLED WITH YOUR NAUSEA MEDICATION  *UNUSUAL SHORTNESS OF BREATH  *UNUSUAL BRUISING OR BLEEDING  TENDERNESS IN MOUTH AND THROAT WITH OR WITHOUT PRESENCE OF ULCERS  *URINARY PROBLEMS  *BOWEL PROBLEMS  UNUSUAL RASH Items with * indicate a potential emergency and should be followed up as soon as possible.  Feel free to call the clinic you have any questions or concerns. The clinic phone number is [phone].  Please show the [facility] at check-in to the Emergency Department and triage nurse.

## 2014-11-12 ENCOUNTER — Ambulatory Visit (HOSPITAL_BASED_OUTPATIENT_CLINIC_OR_DEPARTMENT_OTHER): Payer: BC Managed Care – PPO

## 2014-11-12 DIAGNOSIS — C50412 Malignant neoplasm of upper-outer quadrant of left female breast: Secondary | ICD-10-CM | POA: Diagnosis not present

## 2014-11-12 DIAGNOSIS — Z5189 Encounter for other specified aftercare: Secondary | ICD-10-CM

## 2014-11-12 MED ORDER — PEGFILGRASTIM INJECTION 6 MG/0.6ML ~~LOC~~
6.0000 mg | PREFILLED_SYRINGE | Freq: Once | SUBCUTANEOUS | Status: AC
  Administered 2014-11-12: 6 mg via SUBCUTANEOUS
  Filled 2014-11-12: qty 0.6

## 2014-11-12 NOTE — Patient Instructions (Signed)
Pegfilgrastim injection What is this medicine? PEGFILGRASTIM (peg fil GRA stim) is a long-acting granulocyte colony-stimulating factor that stimulates the growth of neutrophils, a type of white blood cell important in the body's fight against infection. It is used to reduce the incidence of fever and infection in patients with certain types of cancer who are receiving chemotherapy that affects the bone marrow. This medicine may be used for other purposes; ask your health care provider or pharmacist if you have questions. COMMON BRAND NAME(S): Neulasta What should I tell my health care provider before I take this medicine? They need to know if you have any of these conditions: -latex allergy -ongoing radiation therapy -sickle cell disease -skin reactions to acrylic adhesives (On-Body Injector only) -an unusual or allergic reaction to pegfilgrastim, filgrastim, other medicines, foods, dyes, or preservatives -pregnant or trying to get pregnant -breast-feeding How should I use this medicine? This medicine is for injection under the skin. If you get this medicine at home, you will be taught how to prepare and give the pre-filled syringe or how to use the On-body Injector. Refer to the patient Instructions for Use for detailed instructions. Use exactly as directed. Take your medicine at regular intervals. Do not take your medicine more often than directed. It is important that you put your used needles and syringes in a special sharps container. Do not put them in a trash can. If you do not have a sharps container, call your pharmacist or healthcare provider to get one. Talk to your pediatrician regarding the use of this medicine in children. Special care may be needed. Overdosage: If you think you have taken too much of this medicine contact a poison control center or emergency room at once. NOTE: This medicine is only for you. Do not share this medicine with others. What if I miss a dose? It is  important not to miss your dose. Call your doctor or health care professional if you miss your dose. If you miss a dose due to an On-body Injector failure or leakage, a new dose should be administered as soon as possible using a single prefilled syringe for manual use. What may interact with this medicine? Interactions have not been studied. Give your health care provider a list of all the medicines, herbs, non-prescription drugs, or dietary supplements you use. Also tell them if you smoke, drink alcohol, or use illegal drugs. Some items may interact with your medicine. This list may not describe all possible interactions. Give your health care provider a list of all the medicines, herbs, non-prescription drugs, or dietary supplements you use. Also tell them if you smoke, drink alcohol, or use illegal drugs. Some items may interact with your medicine. What should I watch for while using this medicine? You may need blood work done while you are taking this medicine. If you are going to need a MRI, CT scan, or other procedure, tell your doctor that you are using this medicine (On-Body Injector only). What side effects may I notice from receiving this medicine? Side effects that you should report to your doctor or health care professional as soon as possible: -allergic reactions like skin rash, itching or hives, swelling of the face, lips, or tongue -dizziness -fever -pain, redness, or irritation at site where injected -pinpoint red spots on the skin -shortness of breath or breathing problems -stomach or side pain, or pain at the shoulder -swelling -tiredness -trouble passing urine Side effects that usually do not require medical attention (report to your doctor   or health care professional if they continue or are bothersome): -bone pain -muscle pain This list may not describe all possible side effects. Call your doctor for medical advice about side effects. You may report side effects to FDA at  1-800-FDA-1088. Where should I keep my medicine? Keep out of the reach of children. Store pre-filled syringes in a refrigerator between 2 and 8 degrees C (36 and 46 degrees F). Do not freeze. Keep in carton to protect from light. Throw away this medicine if it is left out of the refrigerator for more than 48 hours. Throw away any unused medicine after the expiration date. NOTE: This sheet is a summary. It may not cover all possible information. If you have questions about this medicine, talk to your doctor, pharmacist, or health care provider.  2015, Elsevier/Gold Standard. (2013-10-25 16:14:05)  

## 2014-11-14 ENCOUNTER — Ambulatory Visit: Payer: BC Managed Care – PPO

## 2014-11-20 ENCOUNTER — Encounter: Payer: Self-pay | Admitting: Nurse Practitioner

## 2014-11-20 ENCOUNTER — Encounter: Payer: Self-pay | Admitting: *Deleted

## 2014-11-20 ENCOUNTER — Other Ambulatory Visit (HOSPITAL_BASED_OUTPATIENT_CLINIC_OR_DEPARTMENT_OTHER): Payer: BC Managed Care – PPO

## 2014-11-20 ENCOUNTER — Ambulatory Visit (HOSPITAL_BASED_OUTPATIENT_CLINIC_OR_DEPARTMENT_OTHER): Payer: BC Managed Care – PPO | Admitting: Nurse Practitioner

## 2014-11-20 VITALS — BP 137/84 | HR 90 | Temp 98.3°F | Resp 18 | Ht 63.0 in | Wt 204.6 lb

## 2014-11-20 DIAGNOSIS — C50412 Malignant neoplasm of upper-outer quadrant of left female breast: Secondary | ICD-10-CM | POA: Diagnosis not present

## 2014-11-20 DIAGNOSIS — Z170 Estrogen receptor positive status [ER+]: Secondary | ICD-10-CM | POA: Diagnosis not present

## 2014-11-20 DIAGNOSIS — E119 Type 2 diabetes mellitus without complications: Secondary | ICD-10-CM

## 2014-11-20 LAB — COMPREHENSIVE METABOLIC PANEL (CC13)
ALBUMIN: 3.5 g/dL (ref 3.5–5.0)
ALT: 12 U/L (ref 0–55)
AST: 19 U/L (ref 5–34)
Alkaline Phosphatase: 150 U/L (ref 40–150)
Anion Gap: 9 mEq/L (ref 3–11)
BUN: 9.1 mg/dL (ref 7.0–26.0)
CALCIUM: 9.3 mg/dL (ref 8.4–10.4)
CHLORIDE: 107 meq/L (ref 98–109)
CO2: 26 mEq/L (ref 22–29)
Creatinine: 0.8 mg/dL (ref 0.6–1.1)
EGFR: >90 mL/min/{1.73_m2} (ref >90)
Glucose: 200 mg/dl — ABNORMAL HIGH (ref 70–140)
POTASSIUM: 4.2 meq/L (ref 3.5–5.1)
SODIUM: 141 meq/L (ref 136–145)
TOTAL PROTEIN: 6.8 g/dL (ref 6.4–8.3)
Total Bilirubin: 0.24 mg/dL (ref 0.20–1.20)

## 2014-11-20 LAB — CBC WITH DIFFERENTIAL/PLATELET
BASO%: 0.2 % (ref 0.0–2.0)
Basophils Absolute: 0.1 10*3/uL (ref 0.0–0.1)
EOS%: 0.3 % (ref 0.0–7.0)
Eosinophils Absolute: 0.1 10*3/uL (ref 0.0–0.5)
HCT: 32.9 % — ABNORMAL LOW (ref 34.8–46.6)
HGB: 10.4 g/dL — ABNORMAL LOW (ref 11.6–15.9)
LYMPH%: 9.4 % — AB (ref 14.0–49.7)
MCH: 22.9 pg — ABNORMAL LOW (ref 25.1–34.0)
MCHC: 31.5 g/dL (ref 31.5–36.0)
MCV: 72.8 fL — ABNORMAL LOW (ref 79.5–101.0)
MONO#: 1.3 10*3/uL — AB (ref 0.1–0.9)
MONO%: 4.3 % (ref 0.0–14.0)
NEUT#: 26.9 10*3/uL — ABNORMAL HIGH (ref 1.5–6.5)
NEUT%: 85.8 % — ABNORMAL HIGH (ref 38.4–76.8)
PLATELETS: 300 10*3/uL (ref 145–400)
RBC: 4.53 10*6/uL (ref 3.70–5.45)
RDW: 15.6 % — ABNORMAL HIGH (ref 11.2–14.5)
WBC: 31.4 10*3/uL — ABNORMAL HIGH (ref 3.9–10.3)
lymph#: 2.9 10*3/uL (ref 0.9–3.3)

## 2014-11-20 NOTE — Progress Notes (Signed)
Met with pt during nadir check with Heather. Relate she is doing well and will do better at taking her anti-nausea medications during cycle 2. Denies needs at this time. Encourage pt to call with questions or concerns. Received verbal understanding.

## 2014-11-20 NOTE — Progress Notes (Signed)
[place]  [phone] [phone]     ID: Dominique Dillon DOB: [birth year]  MR#: [id number]  JYN#:[id number]  Patient Care Team: Flora Lipps, FNP as PCP - General (Internal Medicine) Deland Pretty, MD as Referring Physician (Internal Medicine) Newton Pigg, MD as Consulting Physician (Obstetrics and Gynecology) Rolm Bookbinder, MD as Consulting Physician (General Surgery) Chauncey Cruel, MD as Consulting Physician (Oncology) Eppie Gibson, MD as Consulting Physician (Radiation Oncology) Rockwell Germany, RN as Registered Nurse Mauro Kaufmann, RN as Registered Nurse Holley Bouche, NP as Nurse Practitioner (Nurse Practitioner) OTHER MD:  CHIEF COMPLAINT: Triple negative breast cancer  CURRENT TREATMENT: Adjuvant chemotherapy  BREAST CANCER HISTORY: From the original intake note:  Dominique Dillon had bilateral screening mammography at [facility] 08/15/2014 showing a possible mass in the left breast. Left diagnostic mamm[facility] showed the breast density to be category B. There was a persistent spiculated mass in the upper outer quadrant of the left breast, which by ultrasonography measured 0.74 cm. Ultrasound of the left axilla was negative. Biopsy of the left breast mass in question 09/09/2014 showed ([id number]) and invasive ductal carcinoma, grade 2 or 3, E-cadherin positive, estrogen receptor 11% positive with weak staining intensity, progesterone receptor negative, HER-2 negative, with an MIB-1 of 35%.  The patient's subsequent history is as detailed below  INTERVAL HISTORY: Dominique Dillon returns today for follow-up of her triple negative breast cancer. Today is day 10, cycle 1 of 4 planned cycles of cyclophosphamide and docetaxel, with Neulasta support on day 2  REVIEW OF SYSTEMS: She believes her first round of chemotherapy went "horribly" but likely also because she did not follow the  antiemetic schedule or other medial advice she was given. She did not start on her antiemetic medicines until day 3, and by then it was too late. She has recovered now. She was constipated for several days before initiating miralax daily as well. Her blood sugars hit 300 on the day of and after chemotherapy. She did not take any more oral steroids at home after day 0, but she did receive 61m IV as a premed. Her appetite is decreased and she had some taste changes. She had headaches and felt overall fatigued and run down. She took claratin for 7 days and this warded off most of the bone pain. A detailed review of systems is otherwise stable.  PAST MEDICAL HISTORY: Past Medical History  Diagnosis Date  . GERD (gastroesophageal reflux disease)   . Obesity   . Breast cancer of upper-outer quadrant of left female breast 09/12/2014  . History of anemia     still takes iron supplement  . Arthritis     knees  . Hypertension     under control with med., has been on med. since 1990s  . Insulin dependent diabetes mellitus   . Seasonal asthma     no current med.  . DDD (degenerative disc disease), cervical     PAST SURGICAL HISTORY: Past Surgical History  Procedure Laterality Date  . Cesarean section      x 2  . Knee arthroscopy Bilateral   . Abdominal hysterectomy      complete  . Cholecystectomy    . Appendectomy    . Tonsillectomy    . Irrigation and debridement abscess N/A 03/08/2014    Procedure: IRRIGATION AND DEBRIDEMENT ABDOMINAL WALL ABSCESS;  Surgeon: SAdin Hector MD;  Location: [facility];  Service: General;  Laterality: N/A;  .  Colonoscopy    . Upper gi endoscopy    . Radioactive seed guided mastectomy with axillary sentinel lymph node biopsy Left 10/01/2014    Procedure: RADIOACTIVE SEED GUIDED LEFT BREAST LUMPECTOMY WITH LEFT  AXILLARY SENTINEL LYMPH NODE BIOPSY;  Surgeon: Rolm Bookbinder, MD;  Location: [place];  Service: General;  Laterality: Left;  . Carpal  tunnel release Right 08/06/2004  . Trigger finger release Right 08/06/2004    middle finger  . Portacath placement Right 10/21/2014    Procedure: INSERTION PORT-A-CATH;  Surgeon: Rolm Bookbinder, MD;  Location: [place];  Service: General;  Laterality: Right;  . Re-excision of breast lumpectomy Left 10/21/2014    Procedure: RE-EXCISION OF BREAST CANCER, POSTERIOR MARGINS;  Surgeon: Rolm Bookbinder, MD;  Location: [place];  Service: General;  Laterality: Left;    FAMILY HISTORY Family History  Problem Relation Age of Onset  . Prostate cancer Father   . Stomach cancer Maternal Aunt   . Pancreatic cancer Maternal Uncle   . Lung cancer Maternal Grandmother   . Breast cancer Maternal Aunt   . Pancreatic cancer Maternal Aunt   . Pancreatic cancer Maternal Uncle    there is significant stomach (one case) and pancreatic cancer (3 cases) on the maternal side. There is also a history of breast cancer on the maternal side(1 and diagnosed at age 81). The patient's father died the age of 54 with prostate cancer  GYNECOLOGIC HISTORY:  No LMP recorded. Patient has had a hysterectomy. Menarche age 82, first live birth age 33, the patient is G X P2. She underwent hysterectomy with salpingo-oophorectomy in 1995. She did not take hormone replacement.  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 She is widowed. At home she lives with her adopted daughter Dominique Dillon. The patient's 2 biologic daughter are Dominique Dillon who works as a Recruitment consultant and Dominique Dillon who works as a Land, both in [place]. The patient also has a goddaughter, Dominique Dillon, whom she help raise and whom she considers as a daughter    ADVANCED DIRECTIVES: Not in place. At her to 05/29/2015 visit the patient was given the appropriate documents 2 complete and notarize at her discretion. She tells me she intends to name her daughter Dominique Dillon as her healthcare  power of attorney. 68 can be reached at [place]: History  Substance Use Topics  . Smoking status: Never Smoker   . Smokeless tobacco: Never Used  . Alcohol Use: No     Colonoscopy: [place], 2011  PAP:  Bone density: Remote  Lipid panel:  Allergies  Allergen Reactions  . Biaxin [Clarithromycin] Other (See Comments)    UNKNOWN    Current Outpatient Prescriptions  Medication Sig Dispense Refill  . aspirin 81 MG tablet Take 81 mg by mouth daily.    . Calcium Carbonate (CALCIUM 600 PO) Take by mouth.    . cetirizine (ZYRTEC) 10 MG chewable tablet Chew 10 mg by mouth daily.    . Cholecalciferol (VITAMIN D) 1000 UNITS capsule Take 1,000 Units by mouth daily.    . ferrous sulfate 325 (65 FE) MG tablet Take 325 mg by mouth daily with breakfast.    . hydrochlorothiazide (HYDRODIURIL) 25 MG tablet Take 25 mg by mouth every other day.     . insulin lispro protamine-lispro (HUMALOG 75/25 MIX) (75-25) 100 UNIT/ML SUSP injection Inject 30 Units into the skin 2 (two) times daily with  a meal.    . lidocaine-prilocaine (EMLA) cream Apply to affected area once 30 g 3  . LORazepam (ATIVAN) 0.5 MG tablet Take 1 tablet (0.5 mg total) by mouth every 6 (six) hours as needed (Nausea or vomiting). 30 tablet 0  . losartan (COZAAR) 50 MG tablet Take 50 mg by mouth daily.    . metFORMIN (GLUCOPHAGE) 1000 MG tablet Take 1,000 mg by mouth 2 (two) times daily with a meal.    . Omega-3 Fatty Acids (FISH OIL PO) Take by mouth.    . ondansetron (ZOFRAN) 8 MG tablet Take 1 tablet (8 mg total) by mouth 2 (two) times daily. Start the day after chemo for 3 days. Then take as needed for nausea or vomiting. 30 tablet 1  . pantoprazole (PROTONIX) 40 MG tablet Take 40 mg by mouth daily.    Marland Kitchen dexamethasone (DECADRON) 4 MG tablet Take 2 tablets (8 mg total) by mouth 2 (two) times daily. Start the day before Taxotere. Then again the day after chemo for 3 days. (Patient not taking: Reported on 11/20/2014)  30 tablet 1  . oxyCODONE-acetaminophen (PERCOCET) 10-325 MG per tablet Take 1 tablet by mouth every 6 (six) hours as needed for pain. (Patient not taking: Reported on 11/20/2014) 20 tablet 0  . prochlorperazine (COMPAZINE) 10 MG tablet Take 1 tablet (10 mg total) by mouth every 6 (six) hours as needed (Nausea or vomiting). (Patient not taking: Reported on 11/20/2014) 30 tablet 1   No current facility-administered medications for this visit.    OBJECTIVE: Middle-aged African-American woman in no acute distress Filed Vitals:   11/20/14 1132  BP: 137/84  Pulse: 90  Temp: 98.3 F (36.8 C)  Resp: 18     Body mass index is 36.25 kg/(m^2).    ECOG FS:0 - Asymptomatic  Skin: warm, dry  HEENT: sclerae anicteric, conjunctivae pink, oropharynx clear. No thrush or mucositis.  Lymph Nodes: No cervical or supraclavicular lymphadenopathy  Lungs: clear to auscultation bilaterally, no rales, wheezes, or rhonci  Heart: regular rate and rhythm  Abdomen: round, soft, non tender, positive bowel sounds  Musculoskeletal: No focal spinal tenderness, no peripheral edema  Neuro: non focal, well oriented, positive affect  Breasts: deferred  LAB RESULTS:  CMP     Component Value Date/Time   NA 141 11/20/2014 1112   NA 142 10/16/2014 0848   K 4.2 11/20/2014 1112   K 3.9 10/16/2014 0848   CL 108 10/16/2014 0848   CO2 26 11/20/2014 1112   CO2 29 10/16/2014 0848   GLUCOSE 200* 11/20/2014 1112   GLUCOSE 155* 10/16/2014 0848   BUN 9.1 11/20/2014 1112   BUN 11 10/16/2014 0848   CREATININE 0.8 11/20/2014 1112   CREATININE 0.73 10/16/2014 0848   CALCIUM 9.3 11/20/2014 1112   CALCIUM 9.2 10/16/2014 0848   PROT 6.8 11/20/2014 1112   PROT 7.1 03/08/2014 0500   ALBUMIN 3.5 11/20/2014 1112   ALBUMIN 3.1* 03/08/2014 0500   AST 19 11/20/2014 1112   AST 12 03/08/2014 0500   ALT 12 11/20/2014 1112   ALT 8 03/08/2014 0500   ALKPHOS 150 11/20/2014 1112   ALKPHOS 90 03/08/2014 0500   BILITOT 0.24 11/20/2014  1112   BILITOT 0.6 03/08/2014 0500   GFRNONAA >90 10/16/2014 0848   GFRAA >90 10/16/2014 0848    INo results found for: SPEP, UPEP  Lab Results  Component Value Date   WBC 31.4* 11/20/2014   NEUTROABS 26.9* 11/20/2014   HGB 10.4* 11/20/2014  HCT 32.9* 11/20/2014   MCV 72.8* 11/20/2014   PLT 300 11/20/2014      Chemistry      Component Value Date/Time   NA 141 11/20/2014 1112   NA 142 10/16/2014 0848   K 4.2 11/20/2014 1112   K 3.9 10/16/2014 0848   CL 108 10/16/2014 0848   CO2 26 11/20/2014 1112   CO2 29 10/16/2014 0848   BUN 9.1 11/20/2014 1112   BUN 11 10/16/2014 0848   CREATININE 0.8 11/20/2014 1112   CREATININE 0.73 10/16/2014 0848      Component Value Date/Time   CALCIUM 9.3 11/20/2014 1112   CALCIUM 9.2 10/16/2014 0848   ALKPHOS 150 11/20/2014 1112   ALKPHOS 90 03/08/2014 0500   AST 19 11/20/2014 1112   AST 12 03/08/2014 0500   ALT 12 11/20/2014 1112   ALT 8 03/08/2014 0500   BILITOT 0.24 11/20/2014 1112   BILITOT 0.6 03/08/2014 0500       No results found for: LABCA2  No components found for: [id number]  No results for input(s): INR in the last 168 hours.  Urinalysis    Component Value Date/Time   COLORURINE YELLOW 01/14/2007 0708   APPEARANCEUR CLEAR 01/14/2007 0708   LABSPEC 1.017 01/14/2007 0708   PHURINE 6.0 01/14/2007 0708   GLUCOSEU NEGATIVE 01/14/2007 0708   HGBUR NEGATIVE 01/14/2007 0708   BILIRUBINUR NEGATIVE 01/14/2007 0708   KETONESUR NEGATIVE 01/14/2007 0708   PROTEINUR NEGATIVE 01/14/2007 0708   UROBILINOGEN 0.2 01/14/2007 0708   NITRITE NEGATIVE 01/14/2007 0708   LEUKOCYTESUR SMALL* 01/14/2007 0708    STUDIES: Dg Chest Port 1 View  10/21/2014   CLINICAL DATA:  Status post Port-A-Cath placement today. Breast cancer.  EXAM: PORTABLE CHEST - 1 VIEW  COMPARISON:  PA and lateral chest 07/28/2009.  FINDINGS: Right subclavian approach Port-A-Cath is in place with the tip projecting over the mid to lower superior vena cava. No  pneumothorax identified. Lung volumes are low but the lungs are clear. Gas in the left breast and surgical clips are noted.  IMPRESSION: Tip of right IJ approach Port-A-Cath projects over the mid to lower superior vena cava. No pneumothorax identified.   Electronically Signed   By: Inge Rise M.D.   On: 10/21/2014 14:33   Dg Fluoro Guide Cv Line-no Report  10/21/2014   CLINICAL DATA:    FLOURO GUIDE CV LINE  Fluoroscopy was utilized by the requesting physician.  No radiographic  interpretation.     ASSESSMENT: 59 y.o. [place] woman status post left breast upper outer quadrant biopsy 09/09/2014 for a clinical T1b N0, stage IA invasive ductal carcinoma, grade 2 or 3, weakly estrogen receptor positive, progesterone receptor and HER-2 negative, with an MIB-1 of 35%  (1) status post left lumpectomy and sentinel lymph node sampling 10/01/2014 for a pT1c pN0, stage IA invasive ductal carcinoma, grade 3, repeat HER-2 again negative.  (a) positive posterior margin cleared with subsequent excision 10/21/2014  (2) Mammaprint shows a basal type tumor which is clearly high risk. It confirms that the tumor is the triple negative   (3) adjuvant chemotherapy starting 11/11/2014 consisting of cyclophosphamide and docetaxel 4, with Neulasta support  (4) adjuvant radiation to follow chemotherapy  (5) the patient has declined genetics testing   PLAN:  Dominique Dillon had a rough 1st cycle, but I think when she corrects a few errors, she will do better with the second. The labs were reviewed in detail and were stable. I will look into dropping the dexamethasone premed  dose with her next 3 rounds of chemo. Otherwise we reviewed her antiemetic schedule and she will begin compazine and zofran on the advised days, as well as miralax at the first sign of constipation.   Dominique Dillon will return in 2 weeks for the start of cycle 2. She understands and agrees with this plan. She knows the goal of treatment in her case is  cure. She has been encouraged to call with any issues that might arise before her next visit here.    Laurie Panda, NP   11/20/2014 1:20 PM

## 2014-11-29 ENCOUNTER — Other Ambulatory Visit: Payer: Self-pay | Admitting: Nurse Practitioner

## 2014-11-29 ENCOUNTER — Telehealth: Payer: Self-pay | Admitting: *Deleted

## 2014-11-29 NOTE — Telephone Encounter (Signed)
Pt called concerning dexamethasone and her blood sugar. Per Nira Conn, pt is not to take dexamethasone. Gave instructions to pt. Received verbal understanding. Confirmed next chemo treatment on 12/03/14.

## 2014-12-03 ENCOUNTER — Encounter: Payer: Self-pay | Admitting: *Deleted

## 2014-12-03 ENCOUNTER — Ambulatory Visit (HOSPITAL_BASED_OUTPATIENT_CLINIC_OR_DEPARTMENT_OTHER): Payer: BC Managed Care – PPO | Admitting: Nurse Practitioner

## 2014-12-03 ENCOUNTER — Encounter: Payer: Self-pay | Admitting: Nurse Practitioner

## 2014-12-03 ENCOUNTER — Other Ambulatory Visit (HOSPITAL_BASED_OUTPATIENT_CLINIC_OR_DEPARTMENT_OTHER): Payer: BC Managed Care – PPO

## 2014-12-03 ENCOUNTER — Ambulatory Visit (HOSPITAL_BASED_OUTPATIENT_CLINIC_OR_DEPARTMENT_OTHER): Payer: BC Managed Care – PPO

## 2014-12-03 VITALS — BP 142/76 | HR 96 | Temp 98.0°F | Resp 20 | Wt 202.6 lb

## 2014-12-03 DIAGNOSIS — J329 Chronic sinusitis, unspecified: Secondary | ICD-10-CM

## 2014-12-03 DIAGNOSIS — Z5111 Encounter for antineoplastic chemotherapy: Secondary | ICD-10-CM | POA: Diagnosis not present

## 2014-12-03 DIAGNOSIS — C50412 Malignant neoplasm of upper-outer quadrant of left female breast: Secondary | ICD-10-CM

## 2014-12-03 DIAGNOSIS — H578 Other specified disorders of eye and adnexa: Secondary | ICD-10-CM

## 2014-12-03 DIAGNOSIS — E119 Type 2 diabetes mellitus without complications: Secondary | ICD-10-CM

## 2014-12-03 LAB — CBC WITH DIFFERENTIAL/PLATELET
BASO%: 0.4 % (ref 0.0–2.0)
Basophils Absolute: 0 10*3/uL (ref 0.0–0.1)
EOS%: 4.1 % (ref 0.0–7.0)
Eosinophils Absolute: 0.3 10*3/uL (ref 0.0–0.5)
HEMATOCRIT: 35.2 % (ref 34.8–46.6)
HGB: 11.4 g/dL — ABNORMAL LOW (ref 11.6–15.9)
LYMPH#: 1.6 10*3/uL (ref 0.9–3.3)
LYMPH%: 23.2 % (ref 14.0–49.7)
MCH: 24 pg — AB (ref 25.1–34.0)
MCHC: 32.4 g/dL (ref 31.5–36.0)
MCV: 74.1 fL — AB (ref 79.5–101.0)
MONO#: 1 10*3/uL — ABNORMAL HIGH (ref 0.1–0.9)
MONO%: 15.2 % — ABNORMAL HIGH (ref 0.0–14.0)
NEUT#: 3.9 10*3/uL (ref 1.5–6.5)
NEUT%: 57.1 % (ref 38.4–76.8)
Platelets: 320 10*3/uL (ref 145–400)
RBC: 4.75 10*6/uL (ref 3.70–5.45)
RDW: 15.2 % — ABNORMAL HIGH (ref 11.2–14.5)
WBC: 6.9 10*3/uL (ref 3.9–10.3)

## 2014-12-03 LAB — COMPREHENSIVE METABOLIC PANEL (CC13)
ALK PHOS: 116 U/L (ref 40–150)
ALT: 13 U/L (ref 0–55)
AST: 17 U/L (ref 5–34)
Albumin: 3.7 g/dL (ref 3.5–5.0)
Anion Gap: 16 mEq/L — ABNORMAL HIGH (ref 3–11)
BUN: 14.1 mg/dL (ref 7.0–26.0)
CHLORIDE: 103 meq/L (ref 98–109)
CO2: 20 mEq/L — ABNORMAL LOW (ref 22–29)
CREATININE: 0.9 mg/dL (ref 0.6–1.1)
Calcium: 10 mg/dL (ref 8.4–10.4)
EGFR: 86 mL/min/{1.73_m2} — ABNORMAL LOW (ref >90)
Glucose: 247 mg/dl — ABNORMAL HIGH (ref 70–140)
Potassium: 4.2 mEq/L (ref 3.5–5.1)
SODIUM: 139 meq/L (ref 136–145)
TOTAL PROTEIN: 7.2 g/dL (ref 6.4–8.3)
Total Bilirubin: 0.43 mg/dL (ref 0.20–1.20)

## 2014-12-03 MED ORDER — SODIUM CHLORIDE 0.9 % IJ SOLN
10.0000 mL | INTRAMUSCULAR | Status: DC | PRN
  Administered 2014-12-03: 10 mL
  Filled 2014-12-03: qty 10

## 2014-12-03 MED ORDER — HEPARIN SOD (PORK) LOCK FLUSH 100 UNIT/ML IV SOLN
500.0000 [IU] | Freq: Once | INTRAVENOUS | Status: AC | PRN
  Administered 2014-12-03: 500 [IU]
  Filled 2014-12-03: qty 5

## 2014-12-03 MED ORDER — TOBRAMYCIN-DEXAMETHASONE 0.3-0.1 % OP SUSP: 1.0000 [drp] | OPHTHALMIC | Status: DC

## 2014-12-03 MED ORDER — SODIUM CHLORIDE 0.9 % IV SOLN
600.0000 mg/m2 | Freq: Once | INTRAVENOUS | Status: AC
  Administered 2014-12-03: 1220 mg via INTRAVENOUS
  Filled 2014-12-03: qty 61

## 2014-12-03 MED ORDER — AMOXICILLIN-POT CLAVULANATE 875-125 MG PO TABS: 1.0000 | ORAL_TABLET | Freq: Two times a day (BID) | ORAL | Status: DC

## 2014-12-03 MED ORDER — SODIUM CHLORIDE 0.9 % IV SOLN
Freq: Once | INTRAVENOUS | Status: AC
  Administered 2014-12-03: 12:00:00 via INTRAVENOUS
  Filled 2014-12-03: qty 8

## 2014-12-03 MED ORDER — SODIUM CHLORIDE 0.9 % IV SOLN
Freq: Once | INTRAVENOUS | Status: AC
  Administered 2014-12-03: 12:00:00 via INTRAVENOUS

## 2014-12-03 MED ORDER — DOCETAXEL CHEMO INJECTION 160 MG/16ML
75.0000 mg/m2 | Freq: Once | INTRAVENOUS | Status: AC
  Administered 2014-12-03: 150 mg via INTRAVENOUS
  Filled 2014-12-03: qty 15

## 2014-12-03 MED ORDER — ALTEPLASE 2 MG IJ SOLR
2.0000 mg | Freq: Once | INTRAMUSCULAR | Status: DC | PRN
  Filled 2014-12-03: qty 2

## 2014-12-03 NOTE — Progress Notes (Signed)
Met with pt during cycle 2. Relate she was doing well. Denies needs or concerns. Encourage pt to call with questions.

## 2014-12-03 NOTE — Progress Notes (Signed)
[place]  Telephone:([phone]) [phone] Fax:(336) [phone]     ID: Laurel Harnden DOB: June 04, 1956  MR#: [id number]  [facility]#:[id number]  Patient Care Team: Flora Lipps, FNP as PCP - General (Internal Medicine) Deland Pretty, MD as Referring Physician (Internal Medicine) Newton Pigg, MD as Consulting Physician (Obstetrics and Gynecology) Rolm Bookbinder, MD as Consulting Physician (General Surgery) Chauncey Cruel, MD as Consulting Physician (Oncology) Eppie Gibson, MD as Consulting Physician (Radiation Oncology) Rockwell Germany, RN as Registered Nurse Mauro Kaufmann, RN as Registered Nurse Holley Bouche, NP as Nurse Practitioner (Nurse Practitioner) OTHER MD:  CHIEF COMPLAINT: Triple negative breast cancer  CURRENT TREATMENT: Adjuvant chemotherapy  BREAST CANCER HISTORY: From the original intake note:  Cassundra had bilateral screening mammography at [facility] 08/15/2014 showing a possible mass in the left breast. Left diagnostic mammography [facility]2016 showed the breast density to be category B. There was a persistent spiculated mass in the upper outer quadrant of the left breast, which by ultrasonography measured 0.74 cm. Ultrasound of the left axilla was negative. Biopsy of the left breast mass in question 09/09/2014 showed ([id number]) and invasive ductal carcinoma, grade 2 or 3, E-cadherin positive, estrogen receptor 11% positive with weak staining intensity, progesterone receptor negative, HER-2 negative, with an MIB-1 of 35%.  The patient's subsequent history is as detailed below  INTERVAL HISTORY: Dominique Dillon returns today for follow-up of her triple negative breast cancer. Today is day 1, cycle 2 of 4 planned cycles of cyclophosphamide and docetaxel, with Neulasta support on day 2  REVIEW OF SYSTEMS: Adalena is batting a long standing upper respiratory infection. She has seen her mucus change from white to brown to  green. She denies fevers or chills. She has been taking mucinex BID for the cough, but her frontal and maxillary sinuses seemed stuff. She has taken zyrtec daily, but her eyes are still irritated. Otherwise she has no other complains. She is eating and drinking well despite taste changes. She has no nausea, and she is moving her bowels well. Her energy is good and she sleeps well. A detailed review of systems is otherwise stable.  PAST MEDICAL HISTORY: Past Medical History  Diagnosis Date  . GERD (gastroesophageal reflux disease)   . Obesity   . Breast cancer of upper-outer quadrant of left female breast 09/12/2014  . History of anemia     still takes iron supplement  . Arthritis     knees  . Hypertension     under control with med., has been on med. since 1990s  . Insulin dependent diabetes mellitus   . Seasonal asthma     no current med.  . DDD (degenerative disc disease), cervical     PAST SURGICAL HISTORY: Past Surgical History  Procedure Laterality Date  . Cesarean section      x 2  . Knee arthroscopy Bilateral   . Abdominal hysterectomy      complete  . Cholecystectomy    . Appendectomy    . Tonsillectomy    . Irrigation and debridement abscess N/A 03/08/2014    Procedure: IRRIGATION AND DEBRIDEMENT ABDOMINAL WALL ABSCESS;  Surgeon: Adin Hector, MD;  Location: [facility];  Service: General;  Laterality: N/A;  . Colonoscopy    . Upper gi endoscopy    . Radioactive seed guided mastectomy with axillary sentinel lymph node biopsy Left 10/01/2014    Procedure: RADIOACTIVE SEED GUIDED LEFT BREAST LUMPECTOMY WITH LEFT  AXILLARY SENTINEL LYMPH NODE BIOPSY;  Surgeon: Rolm Bookbinder, MD;  Location: [place];  Service: General;  Laterality: Left;  . Carpal tunnel release Right 08/06/2004  . Trigger finger release Right 08/06/2004    middle finger  . Portacath placement Right 10/21/2014    Procedure: INSERTION PORT-A-CATH;  Surgeon: Rolm Bookbinder, MD;  Location:  [place];  Service: General;  Laterality: Right;  . Re-excision of breast lumpectomy Left 10/21/2014    Procedure: RE-EXCISION OF BREAST CANCER, POSTERIOR MARGINS;  Surgeon: Rolm Bookbinder, MD;  Location: [place];  Service: General;  Laterality: Left;    FAMILY HISTORY Family History  Problem Relation Age of Onset  . Prostate cancer Father   . Stomach cancer Maternal Aunt   . Pancreatic cancer Maternal Uncle   . Lung cancer Maternal Grandmother   . Breast cancer Maternal Aunt   . Pancreatic cancer Maternal Aunt   . Pancreatic cancer Maternal Uncle    there is significant stomach (one case) and pancreatic cancer (3 cases) on the maternal side. There is also a history of breast cancer on the maternal side(1 and diagnosed at age 4). The patient's father died the age of 61 with prostate cancer  GYNECOLOGIC HISTORY:  No LMP recorded. Patient has had a hysterectomy. Menarche age 59, first live birth age 59, the patient is G X P2. She underwent hysterectomy with salpingo-oophorectomy in 1995. She did not take hormone replacement.  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 She is widowed. At home she lives with her adopted daughter Feleica Fulmore. The patient's 2 biologic daughter are Seila Liston who works as a Recruitment consultant and Grainne Knights who works as a Land, both in [place]. The patient also has a goddaughter, [name], whom she help raise and whom she considers as a daughter    ADVANCED DIRECTIVES: Not in place. At her to 05/29/2015 visit the patient was given the appropriate documents 2 complete and notarize at her discretion. She tells me she intends to name her daughter [name] as her healthcare power of attorney. 68 can be reached at [place] [place]: History  Substance Use Topics  . Smoking status: Never Smoker   . Smokeless tobacco: Never Used  . Alcohol Use: No     Colonoscopy:  [place], 2011  PAP:  Bone density: Remote  Lipid panel:  Allergies  Allergen Reactions  . Biaxin [Clarithromycin] Other (See Comments)    UNKNOWN    Current Outpatient Prescriptions  Medication Sig Dispense Refill  . aspirin 81 MG tablet Take 81 mg by mouth daily.    . Calcium Carbonate (CALCIUM 600 PO) Take by mouth.    . cetirizine (ZYRTEC) 10 MG chewable tablet Chew 10 mg by mouth daily.    . Cholecalciferol (VITAMIN D) 1000 UNITS capsule Take 1,000 Units by mouth daily.    . ferrous sulfate 325 (65 FE) MG tablet Take 325 mg by mouth daily with breakfast.    . hydrochlorothiazide (HYDRODIURIL) 25 MG tablet Take 25 mg by mouth every other day.     . insulin lispro protamine-lispro (HUMALOG 75/25 MIX) (75-25) 100 UNIT/ML SUSP injection Inject 30 Units into the skin 2 (two) times daily with a meal.    . lidocaine-prilocaine (EMLA) cream Apply to affected area once 30 g 3  . LORazepam (ATIVAN) 0.5 MG tablet Take 1 tablet (0.5 mg total) by mouth every 6 (six) hours as needed (Nausea or vomiting). 30 tablet  0  . losartan (COZAAR) 50 MG tablet Take 50 mg by mouth daily.    . metFORMIN (GLUCOPHAGE) 1000 MG tablet Take 1,000 mg by mouth 2 (two) times daily with a meal.    . Omega-3 Fatty Acids (FISH OIL PO) Take by mouth.    . ondansetron (ZOFRAN) 8 MG tablet Take 1 tablet (8 mg total) by mouth 2 (two) times daily. Start the day after chemo for 3 days. Then take as needed for nausea or vomiting. 30 tablet 1  . oxyCODONE-acetaminophen (PERCOCET) 10-325 MG per tablet Take 1 tablet by mouth every 6 (six) hours as needed for pain. 20 tablet 0  . pantoprazole (PROTONIX) 40 MG tablet Take 40 mg by mouth daily.    . prochlorperazine (COMPAZINE) 10 MG tablet Take 1 tablet (10 mg total) by mouth every 6 (six) hours as needed (Nausea or vomiting). 30 tablet 1  . amoxicillin-clavulanate (AUGMENTIN) 875-125 MG per tablet Take 1 tablet by mouth 2 (two) times daily. 14 tablet 0  . dexamethasone (DECADRON) 4  MG tablet Take 2 tablets (8 mg total) by mouth 2 (two) times daily. Start the day before Taxotere. Then again the day after chemo for 3 days. (Patient not taking: Reported on 11/20/2014) 30 tablet 1  . tobramycin-dexamethasone (TOBRADEX) ophthalmic solution Place 1 drop into both eyes every 4 (four) hours while awake. 5 mL 0   No current facility-administered medications for this visit.    OBJECTIVE: Middle-aged African-American woman in no acute distress Filed Vitals:   12/03/14 1044  BP: 142/76  Pulse: 96  Temp: 98 F (36.7 C)  Resp: 20     Body mass index is 35.9 kg/(m^2).    ECOG FS:0 - Asymptomatic  Sclerae unicteric, pupils equal and reactive Oropharynx clear and moist-- no thrush No cervical or supraclavicular adenopathy Lungs no rales or rhonchi Heart regular rate and rhythm Abd soft, nontender, positive bowel sounds MSK no focal spinal tenderness, no upper extremity lymphedema Neuro: nonfocal, well oriented, appropriate affect Breasts: deferred  LAB RESULTS:  CMP     Component Value Date/Time   NA 141 11/20/2014 1112   NA 142 10/16/2014 0848   K 4.2 11/20/2014 1112   K 3.9 10/16/2014 0848   CL 108 10/16/2014 0848   CO2 26 11/20/2014 1112   CO2 29 10/16/2014 0848   GLUCOSE 200* 11/20/2014 1112   GLUCOSE 155* 10/16/2014 0848   BUN 9.1 11/20/2014 1112   BUN 11 10/16/2014 0848   CREATININE 0.8 11/20/2014 1112   CREATININE 0.73 10/16/2014 0848   CALCIUM 9.3 11/20/2014 1112   CALCIUM 9.2 10/16/2014 0848   PROT 6.8 11/20/2014 1112   PROT 7.1 03/08/2014 0500   ALBUMIN 3.5 11/20/2014 1112   ALBUMIN 3.1* 03/08/2014 0500   AST 19 11/20/2014 1112   AST 12 03/08/2014 0500   ALT 12 11/20/2014 1112   ALT 8 03/08/2014 0500   ALKPHOS 150 11/20/2014 1112   ALKPHOS 90 03/08/2014 0500   BILITOT 0.24 11/20/2014 1112   BILITOT 0.6 03/08/2014 0500   GFRNONAA >90 10/16/2014 0848   GFRAA >90 10/16/2014 0848    INo results found for: SPEP, UPEP  Lab Results  Component  Value Date   WBC 6.9 12/03/2014   NEUTROABS 3.9 12/03/2014   HGB 11.4* 12/03/2014   HCT 35.2 12/03/2014   MCV 74.1* 12/03/2014   PLT 320 12/03/2014      Chemistry      Component Value Date/Time   NA 141 11/20/2014 1112  NA 142 10/16/2014 0848   K 4.2 11/20/2014 1112   K 3.9 10/16/2014 0848   CL 108 10/16/2014 0848   CO2 26 11/20/2014 1112   CO2 29 10/16/2014 0848   BUN 9.1 11/20/2014 1112   BUN 11 10/16/2014 0848   CREATININE 0.8 11/20/2014 1112   CREATININE 0.73 10/16/2014 0848      Component Value Date/Time   CALCIUM 9.3 11/20/2014 1112   CALCIUM 9.2 10/16/2014 0848   ALKPHOS 150 11/20/2014 1112   ALKPHOS 90 03/08/2014 0500   AST 19 11/20/2014 1112   AST 12 03/08/2014 0500   ALT 12 11/20/2014 1112   ALT 8 03/08/2014 0500   BILITOT 0.24 11/20/2014 1112   BILITOT 0.6 03/08/2014 0500       No results found for: LABCA2  No components found for: LABCA125  No results for input(s): INR in the last 168 hours.  Urinalysis    Component Value Date/Time   COLORURINE YELLOW 01/14/2007 0708   APPEARANCEUR CLEAR 01/14/2007 0708   LABSPEC 1.017 01/14/2007 0708   PHURINE 6.0 01/14/2007 0708   GLUCOSEU NEGATIVE 01/14/2007 0708   HGBUR NEGATIVE 01/14/2007 0708   BILIRUBINUR NEGATIVE 01/14/2007 0708   KETONESUR NEGATIVE 01/14/2007 0708   PROTEINUR NEGATIVE 01/14/2007 0708   UROBILINOGEN 0.2 01/14/2007 0708   NITRITE NEGATIVE 01/14/2007 0708   LEUKOCYTESUR SMALL* 01/14/2007 0708    STUDIES: No results found.  ASSESSMENT: 59 y.o. [place] woman status post left breast upper outer quadrant biopsy 09/09/2014 for a clinical T1b N0, stage IA invasive ductal carcinoma, grade 2 or 3, weakly estrogen receptor positive, progesterone receptor and HER-2 negative, with an MIB-1 of 35%  (1) status post left lumpectomy and sentinel lymph node sampling 10/01/2014 for a pT1c pN0, stage IA invasive ductal carcinoma, grade 3, repeat HER-2 again negative.  (a) positive posterior  margin cleared with subsequent excision 10/21/2014  (2) Mammaprint shows a basal type tumor which is clearly high risk. It confirms that the tumor is the triple negative   (3) adjuvant chemotherapy starting 11/11/2014 consisting of cyclophosphamide and docetaxel 4, with Neulasta support  (4) adjuvant radiation to follow chemotherapy  (5) the patient has declined genetics testing   PLAN:  The labs were reviewed in detail and were entirely stable. She will proceed with cycle 2 of cyclophosphamide and docetaxel as planned today. She will only receive 52m dexamethasone IV as premed, and will not take any of the dexamethasone at home to keep her blood sugars from spiking so high.   It seems KBreckinmay be dealing with a sinus infection. I have sent a prescription for augmentin to her pharmacy to take for the next 7 days. She will continue mucinex BID. I have also sent tobradex to her pharmacy for her eye irritation.   KElisabelwill return in 1 week for labs and a nadir visit. She understands and agrees with this plan. She knows the goal of treatment in her case is cure. She has been encouraged to call with any issues that might arise before her next visit here.   HLaurie Panda NP   12/03/2014 11:09 AM

## 2014-12-03 NOTE — Patient Instructions (Addendum)
[facility] Discharge Instructions for Patients Receiving Chemotherapy  Today you received the following chemotherapy agents docetaxel, cytoxan  To help prevent nausea and vomiting after your treatment, we encourage you to take your nausea medication as directed by MD   If you develop nausea and vomiting that is not controlled by your nausea medication, call the clinic.   BELOW ARE SYMPTOMS THAT SHOULD BE REPORTED IMMEDIATELY:  *FEVER GREATER THAN 100.5 F  *CHILLS WITH OR WITHOUT FEVER  NAUSEA AND VOMITING THAT IS NOT CONTROLLED WITH YOUR NAUSEA MEDICATION  *UNUSUAL SHORTNESS OF BREATH  *UNUSUAL BRUISING OR BLEEDING  TENDERNESS IN MOUTH AND THROAT WITH OR WITHOUT PRESENCE OF ULCERS  *URINARY PROBLEMS  *BOWEL PROBLEMS  UNUSUAL RASH Items with * indicate a potential emergency and should be followed up as soon as possible.  Feel free to call the clinic you have any questions or concerns. The clinic phone number is [phone].  Please show the [place] at check-in to the Emergency Department and triage nurse

## 2014-12-05 ENCOUNTER — Ambulatory Visit (HOSPITAL_BASED_OUTPATIENT_CLINIC_OR_DEPARTMENT_OTHER): Payer: BC Managed Care – PPO

## 2014-12-05 VITALS — BP 106/72 | HR 92 | Temp 98.3°F

## 2014-12-05 DIAGNOSIS — C50412 Malignant neoplasm of upper-outer quadrant of left female breast: Secondary | ICD-10-CM | POA: Diagnosis not present

## 2014-12-05 DIAGNOSIS — Z5189 Encounter for other specified aftercare: Secondary | ICD-10-CM | POA: Diagnosis not present

## 2014-12-05 MED ORDER — PEGFILGRASTIM INJECTION 6 MG/0.6ML ~~LOC~~
6.0000 mg | PREFILLED_SYRINGE | Freq: Once | SUBCUTANEOUS | Status: AC
  Administered 2014-12-05: 6 mg via SUBCUTANEOUS
  Filled 2014-12-05: qty 0.6

## 2014-12-10 ENCOUNTER — Encounter: Payer: Self-pay | Admitting: Nurse Practitioner

## 2014-12-10 ENCOUNTER — Ambulatory Visit (HOSPITAL_BASED_OUTPATIENT_CLINIC_OR_DEPARTMENT_OTHER): Payer: BC Managed Care – PPO | Admitting: Nurse Practitioner

## 2014-12-10 ENCOUNTER — Other Ambulatory Visit (HOSPITAL_BASED_OUTPATIENT_CLINIC_OR_DEPARTMENT_OTHER): Payer: BC Managed Care – PPO

## 2014-12-10 ENCOUNTER — Encounter: Payer: Self-pay | Admitting: Oncology

## 2014-12-10 VITALS — BP 134/90 | HR 102 | Temp 98.2°F | Resp 20 | Wt 202.9 lb

## 2014-12-10 DIAGNOSIS — C50412 Malignant neoplasm of upper-outer quadrant of left female breast: Secondary | ICD-10-CM | POA: Diagnosis not present

## 2014-12-10 LAB — CBC WITH DIFFERENTIAL/PLATELET
BASO%: 0.7 % (ref 0.0–2.0)
Basophils Absolute: 0.1 10*3/uL (ref 0.0–0.1)
EOS%: 2.1 % (ref 0.0–7.0)
Eosinophils Absolute: 0.2 10*3/uL (ref 0.0–0.5)
HCT: 34.1 % — ABNORMAL LOW (ref 34.8–46.6)
HGB: 11.2 g/dL — ABNORMAL LOW (ref 11.6–15.9)
LYMPH%: 20.2 % (ref 14.0–49.7)
MCH: 24 pg — AB (ref 25.1–34.0)
MCHC: 32.9 g/dL (ref 31.5–36.0)
MCV: 72.8 fL — ABNORMAL LOW (ref 79.5–101.0)
MONO#: 1.2 10*3/uL — ABNORMAL HIGH (ref 0.1–0.9)
MONO%: 11.6 % (ref 0.0–14.0)
NEUT#: 6.9 10*3/uL — ABNORMAL HIGH (ref 1.5–6.5)
NEUT%: 65.4 % (ref 38.4–76.8)
Platelets: 291 10*3/uL (ref 145–400)
RBC: 4.69 10*6/uL (ref 3.70–5.45)
RDW: 15.5 % — AB (ref 11.2–14.5)
WBC: 10.5 10*3/uL — AB (ref 3.9–10.3)
lymph#: 2.1 10*3/uL (ref 0.9–3.3)

## 2014-12-10 LAB — COMPREHENSIVE METABOLIC PANEL (CC13)
ALBUMIN: 3.7 g/dL (ref 3.5–5.0)
ALT: 15 U/L (ref 0–55)
AST: 21 U/L (ref 5–34)
Alkaline Phosphatase: 130 U/L (ref 40–150)
Anion Gap: 10 mEq/L (ref 3–11)
BUN: 10.6 mg/dL (ref 7.0–26.0)
CALCIUM: 9.9 mg/dL (ref 8.4–10.4)
CO2: 25 meq/L (ref 22–29)
Chloride: 104 mEq/L (ref 98–109)
Creatinine: 0.8 mg/dL (ref 0.6–1.1)
EGFR: >90 mL/min/{1.73_m2} (ref >90)
Glucose: 219 mg/dl — ABNORMAL HIGH (ref 70–140)
POTASSIUM: 4.1 meq/L (ref 3.5–5.1)
Sodium: 139 mEq/L (ref 136–145)
TOTAL PROTEIN: 7.2 g/dL (ref 6.4–8.3)
Total Bilirubin: 0.31 mg/dL (ref 0.20–1.20)

## 2014-12-10 NOTE — Progress Notes (Signed)
[place]  Telephone:(336) [phone] Fax:(336) [phone]     ID: Dominique Dillon DOB: Jul 14, 1956  MR#: [id number]  YSA#:[id number]  Patient Care Team: Flora Lipps, FNP as PCP - General (Internal Medicine) Deland Pretty, MD as Referring Physician (Internal Medicine) Newton Pigg, MD as Consulting Physician (Obstetrics and Gynecology) Rolm Bookbinder, MD as Consulting Physician (General Surgery) Chauncey Cruel, MD as Consulting Physician (Oncology) Eppie Gibson, MD as Consulting Physician (Radiation Oncology) Rockwell Germany, RN as Registered Nurse Mauro Kaufmann, RN as Registered Nurse Holley Bouche, NP as Nurse Practitioner (Nurse Practitioner) OTHER MD:  CHIEF COMPLAINT: Triple negative breast cancer  CURRENT TREATMENT: Adjuvant chemotherapy  BREAST CANCER HISTORY: From the original intake note:  Dominique Dillon had bilateral screening mammography at [facility] 08/15/2014 showing a possible mass in the left breast. Left diagnostic mamm[facility] showed the breast density to be category B. There was a persistent spiculated mass in the upper outer quadrant of the left breast, which by ultrasonography measured 0.74 cm. Ultrasound of the left axilla was negative. Biopsy of the left breast mass in question 09/09/2014 showed ([id number]) and invasive ductal carcinoma, grade 2 or 3, E-cadherin positive, estrogen receptor 11% positive with weak staining intensity, progesterone receptor negative, HER-2 negative, with an MIB-1 of 35%.  The patient's subsequent history is as detailed below  INTERVAL HISTORY: Dominique Dillon returns today for follow-up of her triple negative breast cancer. Today is day 8, cycle 2 of 4 planned cycles of cyclophosphamide and docetaxel, with Neulasta support on day 2  REVIEW OF SYSTEMS: Dominique Dillon is doing well today, stating that she thinks she has chemo figured out. She denies fevers or chills. Her  nausea was well handled by her PRN antiemetics. She had loose stools the first 2 days but these resolved on their own. She finished her course of antibiotics and while her mucus is now clear, she still has an occasional cough. Her eye irritation is better with the tobradex, though her vision is still blurred some from the dexamethasone. Her neulasta bone pain resolved with ibuprofen and claritin. Her blood sugars are under better control. She had some numbness in 3 toes on her left foot that lasted for 2 days, and is no longer present. A detailed review of systems is otherwise stable.   PAST MEDICAL HISTORY: Past Medical History  Diagnosis Date  . GERD (gastroesophageal reflux disease)   . Obesity   . Breast cancer of upper-outer quadrant of left female breast 09/12/2014  . History of anemia     still takes iron supplement  . Arthritis     knees  . Hypertension     under control with med., has been on med. since 1990s  . Insulin dependent diabetes mellitus   . Seasonal asthma     no current med.  . DDD (degenerative disc disease), cervical     PAST SURGICAL HISTORY: Past Surgical History  Procedure Laterality Date  . Cesarean section      x 2  . Knee arthroscopy Bilateral   . Abdominal hysterectomy      complete  . Cholecystectomy    . Appendectomy    . Tonsillectomy    . Irrigation and debridement abscess N/A 03/08/2014    Procedure: IRRIGATION AND DEBRIDEMENT ABDOMINAL WALL ABSCESS;  Surgeon: Adin Hector, MD;  Location: [facility];  Service: General;  Laterality: N/A;  . Colonoscopy    . Upper gi endoscopy    . Radioactive  seed guided mastectomy with axillary sentinel lymph node biopsy Left 10/01/2014    Procedure: RADIOACTIVE SEED GUIDED LEFT BREAST LUMPECTOMY WITH LEFT  AXILLARY SENTINEL LYMPH NODE BIOPSY;  Surgeon: Rolm Bookbinder, MD;  Location: [place];  Service: General;  Laterality: Left;  . Carpal tunnel release Right 08/06/2004  . Trigger finger  release Right 08/06/2004    middle finger  . Portacath placement Right 10/21/2014    Procedure: INSERTION PORT-A-CATH;  Surgeon: Rolm Bookbinder, MD;  Location: [place];  Service: General;  Laterality: Right;  . Re-excision of breast lumpectomy Left 10/21/2014    Procedure: RE-EXCISION OF BREAST CANCER, POSTERIOR MARGINS;  Surgeon: Rolm Bookbinder, MD;  Location: [place];  Service: General;  Laterality: Left;    FAMILY HISTORY Family History  Problem Relation Age of Onset  . Prostate cancer Father   . Stomach cancer Maternal Aunt   . Pancreatic cancer Maternal Uncle   . Lung cancer Maternal Grandmother   . Breast cancer Maternal Aunt   . Pancreatic cancer Maternal Aunt   . Pancreatic cancer Maternal Uncle    there is significant stomach (one case) and pancreatic cancer (3 cases) on the maternal side. There is also a history of breast cancer on the maternal side(1 and diagnosed at age 54). The patient's father died the age of 5 with prostate cancer  GYNECOLOGIC HISTORY:  No LMP recorded. Patient has had a hysterectomy. Menarche age 59, first live birth age 59, the patient is G X P2. She underwent hysterectomy with salpingo-oophorectomy in 1995. She did not take hormone replacement.  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 She is widowed. At home she lives with her adopted daughter [name]. The patient's 2 biologic daughter are [name] who works as a Recruitment consultant and [name] who works as a Land, both in [place]. The patient also has a goddaughter, [name], whom she help raise and whom she considers as a daughter    ADVANCED DIRECTIVES: Not in place. At her to 05/29/2015 visit the patient was given the appropriate documents 2 complete and notarize at her discretion. She tells me she intends to name her daughter [name] as her healthcare power of attorney. 68 can be reached at  [place]: History  Substance Use Topics  . Smoking status: Never Smoker   . Smokeless tobacco: Never Used  . Alcohol Use: No     Colonoscopy: [place], 2011  PAP:  Bone density: Remote  Lipid panel:  Allergies  Allergen Reactions  . Biaxin [Clarithromycin] Other (See Comments)    UNKNOWN    Current Outpatient Prescriptions  Medication Sig Dispense Refill  . aspirin 81 MG tablet Take 81 mg by mouth daily.    . Calcium Carbonate (CALCIUM 600 PO) Take by mouth.    . cetirizine (ZYRTEC) 10 MG chewable tablet Chew 10 mg by mouth daily.    . Cholecalciferol (VITAMIN D) 1000 UNITS capsule Take 1,000 Units by mouth daily.    . ferrous sulfate 325 (65 FE) MG tablet Take 325 mg by mouth daily with breakfast.    . hydrochlorothiazide (HYDRODIURIL) 25 MG tablet Take 25 mg by mouth every other day.     . insulin lispro protamine-lispro (HUMALOG 75/25 MIX) (75-25) 100 UNIT/ML SUSP injection Inject 30 Units into the skin 2 (two) times daily with a meal.    . lidocaine-prilocaine (EMLA) cream Apply to affected area  once 30 g 3  . LORazepam (ATIVAN) 0.5 MG tablet Take 1 tablet (0.5 mg total) by mouth every 6 (six) hours as needed (Nausea or vomiting). 30 tablet 0  . losartan (COZAAR) 50 MG tablet Take 50 mg by mouth daily.    . metFORMIN (GLUCOPHAGE) 1000 MG tablet Take 1,000 mg by mouth 2 (two) times daily with a meal.    . Omega-3 Fatty Acids (FISH OIL PO) Take by mouth.    . ondansetron (ZOFRAN) 8 MG tablet Take 1 tablet (8 mg total) by mouth 2 (two) times daily. Start the day after chemo for 3 days. Then take as needed for nausea or vomiting. 30 tablet 1  . pantoprazole (PROTONIX) 40 MG tablet Take 40 mg by mouth daily.    Marland Kitchen tobramycin-dexamethasone (TOBRADEX) ophthalmic solution Place 1 drop into both eyes every 4 (four) hours while awake. 5 mL 0  . dexamethasone (DECADRON) 4 MG tablet Take 2 tablets (8 mg total) by mouth 2 (two) times daily. Start the day before  Taxotere. Then again the day after chemo for 3 days. (Patient not taking: Reported on 11/20/2014) 30 tablet 1  . oxyCODONE-acetaminophen (PERCOCET) 10-325 MG per tablet Take 1 tablet by mouth every 6 (six) hours as needed for pain. (Patient not taking: Reported on 12/10/2014) 20 tablet 0  . prochlorperazine (COMPAZINE) 10 MG tablet Take 1 tablet (10 mg total) by mouth every 6 (six) hours as needed (Nausea or vomiting). (Patient not taking: Reported on 12/10/2014) 30 tablet 1   No current facility-administered medications for this visit.    OBJECTIVE: Middle-aged African-American woman in no acute distress Filed Vitals:   12/10/14 1501  BP: 134/90  Pulse: 102  Temp: 98.2 F (36.8 C)  Resp: 20     Body mass index is 35.95 kg/(m^2).    ECOG FS:0 - Asymptomatic  Skin: warm, dry  HEENT: sclerae anicteric, conjunctivae pink, oropharynx clear. No thrush or mucositis.  Lymph Nodes: No cervical or supraclavicular lymphadenopathy  Lungs: clear to auscultation bilaterally, no rales, wheezes, or rhonci  Heart: regular rate and rhythm  Abdomen: round, soft, non tender, positive bowel sounds  Musculoskeletal: No focal spinal tenderness, no peripheral edema  Neuro: non focal, well oriented, positive affect  Breasts: deferred  LAB RESULTS:  CMP     Component Value Date/Time   NA 139 12/10/2014 1437   NA 142 10/16/2014 0848   K 4.1 12/10/2014 1437   K 3.9 10/16/2014 0848   CL 108 10/16/2014 0848   CO2 25 12/10/2014 1437   CO2 29 10/16/2014 0848   GLUCOSE 219* 12/10/2014 1437   GLUCOSE 155* 10/16/2014 0848   BUN 10.6 12/10/2014 1437   BUN 11 10/16/2014 0848   CREATININE 0.8 12/10/2014 1437   CREATININE 0.73 10/16/2014 0848   CALCIUM 9.9 12/10/2014 1437   CALCIUM 9.2 10/16/2014 0848   PROT 7.2 12/10/2014 1437   PROT 7.1 03/08/2014 0500   ALBUMIN 3.7 12/10/2014 1437   ALBUMIN 3.1* 03/08/2014 0500   AST 21 12/10/2014 1437   AST 12 03/08/2014 0500   ALT 15 12/10/2014 1437   ALT 8  03/08/2014 0500   ALKPHOS 130 12/10/2014 1437   ALKPHOS 90 03/08/2014 0500   BILITOT 0.31 12/10/2014 1437   BILITOT 0.6 03/08/2014 0500   GFRNONAA >90 10/16/2014 0848   GFRAA >90 10/16/2014 0848    INo results found for: SPEP, UPEP  Lab Results  Component Value Date   WBC 10.5* 12/10/2014   NEUTROABS 6.9*  12/10/2014   HGB 11.2* 12/10/2014   HCT 34.1* 12/10/2014   MCV 72.8* 12/10/2014   PLT 291 12/10/2014      Chemistry      Component Value Date/Time   NA 139 12/10/2014 1437   NA 142 10/16/2014 0848   K 4.1 12/10/2014 1437   K 3.9 10/16/2014 0848   CL 108 10/16/2014 0848   CO2 25 12/10/2014 1437   CO2 29 10/16/2014 0848   BUN 10.6 12/10/2014 1437   BUN 11 10/16/2014 0848   CREATININE 0.8 12/10/2014 1437   CREATININE 0.73 10/16/2014 0848      Component Value Date/Time   CALCIUM 9.9 12/10/2014 1437   CALCIUM 9.2 10/16/2014 0848   ALKPHOS 130 12/10/2014 1437   ALKPHOS 90 03/08/2014 0500   AST 21 12/10/2014 1437   AST 12 03/08/2014 0500   ALT 15 12/10/2014 1437   ALT 8 03/08/2014 0500   BILITOT 0.31 12/10/2014 1437   BILITOT 0.6 03/08/2014 0500       No results found for: LABCA2  No components found for: LABCA125  No results for input(s): INR in the last 168 hours.  Urinalysis    Component Value Date/Time   COLORURINE YELLOW 01/14/2007 0708   APPEARANCEUR CLEAR 01/14/2007 0708   LABSPEC 1.017 01/14/2007 0708   PHURINE 6.0 01/14/2007 0708   GLUCOSEU NEGATIVE 01/14/2007 0708   HGBUR NEGATIVE 01/14/2007 0708   BILIRUBINUR NEGATIVE 01/14/2007 0708   KETONESUR NEGATIVE 01/14/2007 0708   PROTEINUR NEGATIVE 01/14/2007 0708   UROBILINOGEN 0.2 01/14/2007 0708   NITRITE NEGATIVE 01/14/2007 0708   LEUKOCYTESUR SMALL* 01/14/2007 0708    STUDIES: No results found.  ASSESSMENT: 59 y.o. [place] woman status post left breast upper outer quadrant biopsy 09/09/2014 for a clinical T1b N0, stage IA invasive ductal carcinoma, grade 2 or 3, weakly estrogen  receptor positive, progesterone receptor and HER-2 negative, with an MIB-1 of 35%  (1) status post left lumpectomy and sentinel lymph node sampling 10/01/2014 for a pT1c pN0, stage IA invasive ductal carcinoma, grade 3, repeat HER-2 again negative.  (a) positive posterior margin cleared with subsequent excision 10/21/2014  (2) Mammaprint shows a basal type tumor which is clearly high risk. It confirms that the tumor is the triple negative   (3) adjuvant chemotherapy starting 11/11/2014 consisting of cyclophosphamide and docetaxel 4, with Neulasta support  (4) adjuvant radiation to follow chemotherapy  (5) the patient has declined genetics testing   PLAN:  Sherrian managed this cycle better than the first, though not well enough to work as much this week. The labs were reviewed in detail and were entirely stable. I am making no changes to her regimen as for the most part she was able to manage the side effects on her own. I suggested she use robitussin for her residual cough, but the infection itself seemed to clear up with the augmentin.   Taccara will return in 2 weeks for the start of cycle 3. She understands and agrees with this plan. She knows the goal of treatment in her case is cure. She has been encouraged to call with any issues that might arise before her next visit here.  Laurie Panda, NP   12/10/2014 4:17 PM

## 2014-12-10 NOTE — Progress Notes (Signed)
Pt is approved for the $1000 [employer] grant.  

## 2014-12-24 ENCOUNTER — Other Ambulatory Visit (HOSPITAL_BASED_OUTPATIENT_CLINIC_OR_DEPARTMENT_OTHER): Payer: BC Managed Care – PPO

## 2014-12-24 ENCOUNTER — Ambulatory Visit (HOSPITAL_BASED_OUTPATIENT_CLINIC_OR_DEPARTMENT_OTHER): Payer: BC Managed Care – PPO

## 2014-12-24 ENCOUNTER — Ambulatory Visit (HOSPITAL_BASED_OUTPATIENT_CLINIC_OR_DEPARTMENT_OTHER): Payer: BC Managed Care – PPO | Admitting: Nurse Practitioner

## 2014-12-24 ENCOUNTER — Encounter: Payer: Self-pay | Admitting: Nurse Practitioner

## 2014-12-24 ENCOUNTER — Ambulatory Visit: Payer: BC Managed Care – PPO

## 2014-12-24 ENCOUNTER — Telehealth: Payer: Self-pay | Admitting: Nurse Practitioner

## 2014-12-24 ENCOUNTER — Encounter: Payer: Self-pay | Admitting: *Deleted

## 2014-12-24 VITALS — BP 124/65 | HR 82 | Temp 97.6°F | Resp 18 | Ht 63.0 in | Wt 206.0 lb

## 2014-12-24 DIAGNOSIS — Z95828 Presence of other vascular implants and grafts: Secondary | ICD-10-CM

## 2014-12-24 DIAGNOSIS — H538 Other visual disturbances: Secondary | ICD-10-CM | POA: Diagnosis not present

## 2014-12-24 DIAGNOSIS — C50412 Malignant neoplasm of upper-outer quadrant of left female breast: Secondary | ICD-10-CM

## 2014-12-24 DIAGNOSIS — Z5111 Encounter for antineoplastic chemotherapy: Secondary | ICD-10-CM

## 2014-12-24 LAB — COMPREHENSIVE METABOLIC PANEL (CC13)
ALBUMIN: 3.7 g/dL (ref 3.5–5.0)
ALT: 11 U/L (ref 0–55)
AST: 16 U/L (ref 5–34)
Alkaline Phosphatase: 107 U/L (ref 40–150)
Anion Gap: 12 mEq/L — ABNORMAL HIGH (ref 3–11)
BUN: 11.5 mg/dL (ref 7.0–26.0)
CO2: 26 mEq/L (ref 22–29)
Calcium: 9.6 mg/dL (ref 8.4–10.4)
Chloride: 104 mEq/L (ref 98–109)
Creatinine: 0.7 mg/dL (ref 0.6–1.1)
EGFR: >90 mL/min/{1.73_m2} (ref >90)
Glucose: 168 mg/dl — ABNORMAL HIGH (ref 70–140)
POTASSIUM: 4.2 meq/L (ref 3.5–5.1)
Sodium: 142 mEq/L (ref 136–145)
Total Bilirubin: 0.61 mg/dL (ref 0.20–1.20)
Total Protein: 6.8 g/dL (ref 6.4–8.3)

## 2014-12-24 LAB — CBC WITH DIFFERENTIAL/PLATELET
BASO%: 0.5 % (ref 0.0–2.0)
BASOS ABS: 0 10*3/uL (ref 0.0–0.1)
EOS%: 0.9 % (ref 0.0–7.0)
Eosinophils Absolute: 0.1 10*3/uL (ref 0.0–0.5)
HCT: 31.2 % — ABNORMAL LOW (ref 34.8–46.6)
HGB: 10.2 g/dL — ABNORMAL LOW (ref 11.6–15.9)
LYMPH%: 19.4 % (ref 14.0–49.7)
MCH: 24.2 pg — ABNORMAL LOW (ref 25.1–34.0)
MCHC: 32.7 g/dL (ref 31.5–36.0)
MCV: 73.9 fL — AB (ref 79.5–101.0)
MONO#: 0.7 10*3/uL (ref 0.1–0.9)
MONO%: 12.9 % (ref 0.0–14.0)
NEUT%: 66.3 % (ref 38.4–76.8)
NEUTROS ABS: 3.8 10*3/uL (ref 1.5–6.5)
PLATELETS: 249 10*3/uL (ref 145–400)
RBC: 4.22 10*6/uL (ref 3.70–5.45)
RDW: 16.3 % — ABNORMAL HIGH (ref 11.2–14.5)
WBC: 5.7 10*3/uL (ref 3.9–10.3)
lymph#: 1.1 10*3/uL (ref 0.9–3.3)

## 2014-12-24 MED ORDER — SODIUM CHLORIDE 0.9 % IJ SOLN
10.0000 mL | INTRAMUSCULAR | Status: DC | PRN
  Administered 2014-12-24: 10 mL
  Filled 2014-12-24: qty 10

## 2014-12-24 MED ORDER — SODIUM CHLORIDE 0.9 % IV SOLN
Freq: Once | INTRAVENOUS | Status: AC
  Administered 2014-12-24: 13:00:00 via INTRAVENOUS
  Filled 2014-12-24: qty 8

## 2014-12-24 MED ORDER — SODIUM CHLORIDE 0.9 % IJ SOLN
10.0000 mL | INTRAMUSCULAR | Status: DC | PRN
  Administered 2014-12-24: 10 mL via INTRAVENOUS
  Filled 2014-12-24: qty 10

## 2014-12-24 MED ORDER — SODIUM CHLORIDE 0.9 % IV SOLN
Freq: Once | INTRAVENOUS | Status: AC
  Administered 2014-12-24: 13:00:00 via INTRAVENOUS

## 2014-12-24 MED ORDER — HEPARIN SOD (PORK) LOCK FLUSH 100 UNIT/ML IV SOLN
500.0000 [IU] | Freq: Once | INTRAVENOUS | Status: AC | PRN
  Administered 2014-12-24: 500 [IU]
  Filled 2014-12-24: qty 5

## 2014-12-24 MED ORDER — SODIUM CHLORIDE 0.9 % IV SOLN
600.0000 mg/m2 | Freq: Once | INTRAVENOUS | Status: AC
  Administered 2014-12-24: 1220 mg via INTRAVENOUS
  Filled 2014-12-24: qty 61

## 2014-12-24 MED ORDER — DOCETAXEL CHEMO INJECTION 160 MG/16ML
75.0000 mg/m2 | Freq: Once | INTRAVENOUS | Status: AC
  Administered 2014-12-24: 150 mg via INTRAVENOUS
  Filled 2014-12-24: qty 15

## 2014-12-24 NOTE — Progress Notes (Signed)
[place] [employer]  [employer] representative met with patient in [place] office at [facility] to assess patients needs and offer support.  Patient had previously expressed financial concerns since being diagnosed.  Patient was working prior to diagnosis and now is experiencing additional financial stressors due to treatment.  CSW and patient reviewed financial resources and organizations.  CSW and patient reviewed applications for [employer], [employer], [employer], and [employer].  CSW submitted preliminary application for [employer].  Patient plans to collect requested documentation, and complete patient portions of the applications.  Once completed patient will return information to CSW.  At that time CSW can review and submit applications.  CSW encouraged patient to call with any questions or concerns.    Johnnye Lana, MSW, LCSW, OSW-C Clinical Social Worker [facility] [phone]

## 2014-12-24 NOTE — Patient Instructions (Signed)
Cyclophosphamide injection What is this medicine? CYCLOPHOSPHAMIDE (sye kloe FOSS fa mide) is a chemotherapy drug. It slows the growth of cancer cells. This medicine is used to treat many types of cancer like lymphoma, myeloma, leukemia, breast cancer, and ovarian cancer, to name a few. This medicine may be used for other purposes; ask your health care provider or pharmacist if you have questions. COMMON BRAND NAME(S): Cytoxan, Neosar What should I tell my health care provider before I take this medicine? They need to know if you have any of these conditions: -blood disorders -history of other chemotherapy -infection -kidney disease -liver disease -recent or ongoing radiation therapy -tumors in the bone marrow -an unusual or allergic reaction to cyclophosphamide, other chemotherapy, other medicines, foods, dyes, or preservatives -pregnant or trying to get pregnant -breast-feeding How should I use this medicine? This drug is usually given as an injection into a vein or muscle or by infusion into a vein. It is administered in a hospital or clinic by a specially trained health care professional. Talk to your pediatrician regarding the use of this medicine in children. Special care may be needed. Overdosage: If you think you have taken too much of this medicine contact a poison control center or emergency room at once. NOTE: This medicine is only for you. Do not share this medicine with others. What if I miss a dose? It is important not to miss your dose. Call your doctor or health care professional if you are unable to keep an appointment. What may interact with this medicine? This medicine may interact with the following medications: -amiodarone -amphotericin B -azathioprine -certain antiviral medicines for HIV or AIDS such as protease inhibitors (e.g., indinavir, ritonavir) and zidovudine -certain blood pressure medications such as benazepril, captopril, enalapril, fosinopril,  lisinopril, moexipril, monopril, perindopril, quinapril, ramipril, trandolapril -certain cancer medications such as anthracyclines (e.g., daunorubicin, doxorubicin), busulfan, cytarabine, paclitaxel, pentostatin, tamoxifen, trastuzumab -certain diuretics such as chlorothiazide, chlorthalidone, hydrochlorothiazide, indapamide, metolazone -certain medicines that treat or prevent blood clots like warfarin -certain muscle relaxants such as succinylcholine -cyclosporine -etanercept -indomethacin -medicines to increase blood counts like filgrastim, pegfilgrastim, sargramostim -medicines used as general anesthesia -metronidazole -natalizumab This list may not describe all possible interactions. Give your health care provider a list of all the medicines, herbs, non-prescription drugs, or dietary supplements you use. Also tell them if you smoke, drink alcohol, or use illegal drugs. Some items may interact with your medicine. What should I watch for while using this medicine? Visit your doctor for checks on your progress. This drug may make you feel generally unwell. This is not uncommon, as chemotherapy can affect healthy cells as well as cancer cells. Report any side effects. Continue your course of treatment even though you feel ill unless your doctor tells you to stop. Drink water or other fluids as directed. Urinate often, even at night. In some cases, you may be given additional medicines to help with side effects. Follow all directions for their use. Call your doctor or health care professional for advice if you get a fever, chills or sore throat, or other symptoms of a cold or flu. Do not treat yourself. This drug decreases your body's ability to fight infections. Try to avoid being around people who are sick. This medicine may increase your risk to bruise or bleed. Call your doctor or health care professional if you notice any unusual bleeding. Be careful brushing and flossing your teeth or using a  toothpick because you may get an infection or bleed   more easily. If you have any dental work done, tell your dentist you are receiving this medicine. You may get drowsy or dizzy. Do not drive, use machinery, or do anything that needs mental alertness until you know how this medicine affects you. Do not become pregnant while taking this medicine or for 1 year after stopping it. Women should inform their doctor if they wish to become pregnant or think they might be pregnant. Men should not father a child while taking this medicine and for 4 months after stopping it. There is a potential for serious side effects to an unborn child. Talk to your health care professional or pharmacist for more information. Do not breast-feed an infant while taking this medicine. This medicine may interfere with the ability to have a child. This medicine has caused ovarian failure in some women. This medicine has caused reduced sperm counts in some men. You should talk with your doctor or health care professional if you are concerned about your fertility. If you are going to have surgery, tell your doctor or health care professional that you have taken this medicine. What side effects may I notice from receiving this medicine? Side effects that you should report to your doctor or health care professional as soon as possible: -allergic reactions like skin rash, itching or hives, swelling of the face, lips, or tongue -low blood counts - this medicine may decrease the number of white blood cells, red blood cells and platelets. You may be at increased risk for infections and bleeding. -signs of infection - fever or chills, cough, sore throat, pain or difficulty passing urine -signs of decreased platelets or bleeding - bruising, pinpoint red spots on the skin, black, tarry stools, blood in the urine -signs of decreased red blood cells - unusually weak or tired, fainting spells, lightheadedness -breathing problems -dark  urine -dizziness -palpitations -swelling of the ankles, feet, hands -trouble passing urine or change in the amount of urine -weight gain -yellowing of the eyes or skin Side effects that usually do not require medical attention (report to your doctor or health care professional if they continue or are bothersome): -changes in nail or skin color -hair loss -missed menstrual periods -mouth sores -nausea, vomiting This list may not describe all possible side effects. Call your doctor for medical advice about side effects. You may report side effects to FDA at 1-800-FDA-1088. Where should I keep my medicine? This drug is given in a hospital or clinic and will not be stored at home. NOTE: This sheet is a summary. It may not cover all possible information. If you have questions about this medicine, talk to your doctor, pharmacist, or health care provider.  2015, [employer]. (2012-06-09 16:22:58) Docetaxel injection What is this medicine? DOCETAXEL (doe se TAX el) is a chemotherapy drug. It targets fast dividing cells, like cancer cells, and causes these cells to die. This medicine is used to treat many types of cancers like breast cancer, certain stomach cancers, head and neck cancer, lung cancer, and prostate cancer. This medicine may be used for other purposes; ask your health care provider or pharmacist if you have questions. COMMON BRAND NAME(S): Docefrez, Taxotere What should I tell my health care provider before I take this medicine? They need to know if you have any of these conditions: -infection (especially a virus infection such as chickenpox, cold sores, or herpes) -liver disease -low blood counts, like low white cell, platelet, or red cell counts -an unusual or allergic reaction to docetaxel,  polysorbate 80, other chemotherapy agents, other medicines, foods, dyes, or preservatives -pregnant or trying to get pregnant -breast-feeding How should I use this  medicine? This drug is given as an infusion into a vein. It is administered in a hospital or clinic by a specially trained health care professional. Talk to your pediatrician regarding the use of this medicine in children. Special care may be needed. Overdosage: If you think you have taken too much of this medicine contact a poison control center or emergency room at once. NOTE: This medicine is only for you. Do not share this medicine with others. What if I miss a dose? It is important not to miss your dose. Call your doctor or health care professional if you are unable to keep an appointment. What may interact with this medicine? -cyclosporine -erythromycin -ketoconazole -medicines to increase blood counts like filgrastim, pegfilgrastim, sargramostim -vaccines Talk to your doctor or health care professional before taking any of these medicines: -acetaminophen -aspirin -ibuprofen -ketoprofen -naproxen This list may not describe all possible interactions. Give your health care provider a list of all the medicines, herbs, non-prescription drugs, or dietary supplements you use. Also tell them if you smoke, drink alcohol, or use illegal drugs. Some items may interact with your medicine. What should I watch for while using this medicine? Your condition will be monitored carefully while you are receiving this medicine. You will need important blood work done while you are taking this medicine. This drug may make you feel generally unwell. This is not uncommon, as chemotherapy can affect healthy cells as well as cancer cells. Report any side effects. Continue your course of treatment even though you feel ill unless your doctor tells you to stop. In some cases, you may be given additional medicines to help with side effects. Follow all directions for their use. Call your doctor or health care professional for advice if you get a fever, chills or sore throat, or other symptoms of a cold or flu. Do  not treat yourself. This drug decreases your body's ability to fight infections. Try to avoid being around people who are sick. This medicine may increase your risk to bruise or bleed. Call your doctor or health care professional if you notice any unusual bleeding. Be careful brushing and flossing your teeth or using a toothpick because you may get an infection or bleed more easily. If you have any dental work done, tell your dentist you are receiving this medicine. Avoid taking products that contain aspirin, acetaminophen, ibuprofen, naproxen, or ketoprofen unless instructed by your doctor. These medicines may hide a fever. This medicine contains an alcohol in the product. You may get drowsy or dizzy. Do not drive, use machinery, or do anything that needs mental alertness until you know how this medicine affects you. Do not stand or sit up quickly, especially if you are an older patient. This reduces the risk of dizzy or fainting spells. Avoid alcoholic drinks Do not become pregnant while taking this medicine. Women should inform their doctor if they wish to become pregnant or think they might be pregnant. There is a potential for serious side effects to an unborn child. Talk to your health care professional or pharmacist for more information. Do not breast-feed an infant while taking this medicine. What side effects may I notice from receiving this medicine? Side effects that you should report to your doctor or health care professional as soon as possible: -allergic reactions like skin rash, itching or hives, swelling of the face,  lips, or tongue -low blood counts - This drug may decrease the number of white blood cells, red blood cells and platelets. You may be at increased risk for infections and bleeding. -signs of infection - fever or chills, cough, sore throat, pain or difficulty passing urine -signs of decreased platelets or bleeding - bruising, pinpoint red spots on the skin, black, tarry stools,  nosebleeds -signs of decreased red blood cells - unusually weak or tired, fainting spells, lightheadedness -breathing problems -fast or irregular heartbeat -low blood pressure -mouth sores -nausea and vomiting -pain, swelling, redness or irritation at the injection site -pain, tingling, numbness in the hands or feet -swelling of the ankle, feet, hands -weight gain Side effects that usually do not require medical attention (report to your prescriber or health care professional if they continue or are bothersome): -bone pain -complete hair loss including hair on your head, underarms, pubic hair, eyebrows, and eyelashes -diarrhea -excessive tearing -changes in the color of fingernails -loosening of the fingernails -nausea -muscle pain -red flush to skin -sweating -weak or tired This list may not describe all possible side effects. Call your doctor for medical advice about side effects. You may report side effects to FDA at 1-800-FDA-1088. Where should I keep my medicine? This drug is given in a hospital or clinic and will not be stored at home. NOTE: This sheet is a summary. It may not cover all possible information. If you have questions about this medicine, talk to your doctor, pharmacist, or health care provider.  2015, [employer]. (2013-06-21 22:21:02)

## 2014-12-24 NOTE — Patient Instructions (Signed)

## 2014-12-24 NOTE — Progress Notes (Signed)
[place]  [phone] [phone]     ID: Dominique Dillon DOB: [birth year]  MR#: [id number]  VCB#:[id number]  Patient Care Team: Flora Lipps, FNP as PCP - General (Internal Medicine) Deland Pretty, MD as Referring Physician (Internal Medicine) Newton Pigg, MD as Consulting Physician (Obstetrics and Gynecology) Rolm Bookbinder, MD as Consulting Physician (General Surgery) Chauncey Cruel, MD as Consulting Physician (Oncology) Eppie Gibson, MD as Consulting Physician (Radiation Oncology) Rockwell Germany, RN as Registered Nurse Mauro Kaufmann, RN as Registered Nurse Holley Bouche, NP as Nurse Practitioner (Nurse Practitioner) OTHER MD:  CHIEF COMPLAINT: Triple negative breast cancer  CURRENT TREATMENT: Adjuvant chemotherapy  BREAST CANCER HISTORY: From the original intake note:  Dominique Dillon had bilateral screening mammography at [facility] 08/15/2014 showing a possible mass in the left breast. Left diagnostic mammography [facility] showed the breast density to be category B. There was a persistent spiculated mass in the upper outer quadrant of the left breast, which by ultrasonography measured 0.74 cm. Ultrasound of the left axilla was negative. Biopsy of the left breast mass in question 09/09/2014 showed ([id number]) and invasive ductal carcinoma, grade 2 or 3, E-cadherin positive, estrogen receptor 11% positive with weak staining intensity, progesterone receptor negative, HER-2 negative, with an MIB-1 of 35%.  The patient's subsequent history is as detailed below  INTERVAL HISTORY: Dominique Dillon returns today for follow-up of her triple negative breast cancer. Today is day 1, cycle 3 of 4 planned cycles of cyclophosphamide and docetaxel, with Neulasta support on day 2  REVIEW OF SYSTEMS: Dominique Dillon denies fevers, chills, nausea, vomiting, or changes in bowel or bladder habits. Her appetite is healthy  despite taste changes. She continues to battle blurred vision and dry eyes. She denies mouth sores, rashes, or neuropathy symptoms. She continues to have a mild cough and some ear stuffiness, but relates this to sinuses. She has mild fatigue, and sleeps well at night. A detailed review of systems is otherwise stable.   PAST MEDICAL HISTORY: Past Medical History  Diagnosis Date  . GERD (gastroesophageal reflux disease)   . Obesity   . Breast cancer of upper-outer quadrant of left female breast 09/12/2014  . History of anemia     still takes iron supplement  . Arthritis     knees  . Hypertension     under control with med., has been on med. since 1990s  . Insulin dependent diabetes mellitus   . Seasonal asthma     no current med.  . DDD (degenerative disc disease), cervical     PAST SURGICAL HISTORY: Past Surgical History  Procedure Laterality Date  . Cesarean section      x 2  . Knee arthroscopy Bilateral   . Abdominal hysterectomy      complete  . Cholecystectomy    . Appendectomy    . Tonsillectomy    . Irrigation and debridement abscess N/A 03/08/2014    Procedure: IRRIGATION AND DEBRIDEMENT ABDOMINAL WALL ABSCESS;  Surgeon: Adin Hector, MD;  Location: [facility];  Service: General;  Laterality: N/A;  . Colonoscopy    . Upper gi endoscopy    . Radioactive seed guided mastectomy with axillary sentinel lymph node biopsy Left 10/01/2014    Procedure: RADIOACTIVE SEED GUIDED LEFT BREAST LUMPECTOMY WITH LEFT  AXILLARY SENTINEL LYMPH NODE BIOPSY;  Surgeon: Rolm Bookbinder, MD;  Location: [place];  Service: General;  Laterality: Left;  . Carpal tunnel release Right 08/06/2004  .  Trigger finger release Right 08/06/2004    middle finger  . Portacath placement Right 10/21/2014    Procedure: INSERTION PORT-A-CATH;  Surgeon: Rolm Bookbinder, MD;  Location: [place];  Service: General;  Laterality: Right;  . Re-excision of breast lumpectomy Left  10/21/2014    Procedure: RE-EXCISION OF BREAST CANCER, POSTERIOR MARGINS;  Surgeon: Rolm Bookbinder, MD;  Location: [place];  Service: General;  Laterality: Left;    FAMILY HISTORY Family History  Problem Relation Age of Onset  . Prostate cancer Father   . Stomach cancer Maternal Aunt   . Pancreatic cancer Maternal Uncle   . Lung cancer Maternal Grandmother   . Breast cancer Maternal Aunt   . Pancreatic cancer Maternal Aunt   . Pancreatic cancer Maternal Uncle    there is significant stomach (one case) and pancreatic cancer (3 cases) on the maternal side. There is also a history of breast cancer on the maternal side(1 and diagnosed at age 69). The patient's father died the age of 27 with prostate cancer  GYNECOLOGIC HISTORY:  No LMP recorded. Patient has had a hysterectomy. Menarche age 22, first live birth age 19, the patient is G X P2. She underwent hysterectomy with salpingo-oophorectomy in 1995. She did not take hormone replacement.  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 She is widowed. At home she lives with her adopted daughter Dominique Dillon. The patient's 2 biologic daughter are Dominique Dillon who works as a Recruitment consultant and Dominique Dillon who works as a Land, both in [place]. The patient also has a goddaughter, Dominique Dillon, whom she help raise and whom she considers as a daughter    ADVANCED DIRECTIVES: Not in place. At her to 05/29/2015 visit the patient was given the appropriate documents 2 complete and notarize at her discretion. She tells me she intends to name her daughter Dominique Dillon as her healthcare power of attorney. 68 can be reached at [place]: History  Substance Use Topics  . Smoking status: Never Smoker   . Smokeless tobacco: Never Used  . Alcohol Use: No     Colonoscopy: [facility], 2011  PAP:  Bone density: Remote  Lipid panel:  Allergies  Allergen Reactions  . Biaxin  [Clarithromycin] Other (See Comments)    UNKNOWN    Current Outpatient Prescriptions  Medication Sig Dispense Refill  . aspirin 81 MG tablet Take 81 mg by mouth daily.    . Calcium Carbonate (CALCIUM 600 PO) Take by mouth.    . cetirizine (ZYRTEC) 10 MG chewable tablet Chew 10 mg by mouth daily.    . Cholecalciferol (VITAMIN D) 1000 UNITS capsule Take 1,000 Units by mouth daily.    Marland Kitchen dexamethasone (DECADRON) 4 MG tablet Take 2 tablets (8 mg total) by mouth 2 (two) times daily. Start the day before Taxotere. Then again the day after chemo for 3 days. 30 tablet 1  . ferrous sulfate 325 (65 FE) MG tablet Take 325 mg by mouth daily with breakfast.    . hydrochlorothiazide (HYDRODIURIL) 25 MG tablet Take 25 mg by mouth every other day.     . insulin lispro protamine-lispro (HUMALOG 75/25 MIX) (75-25) 100 UNIT/ML SUSP injection Inject 30 Units into the skin 2 (two) times daily with a meal.    . lidocaine-prilocaine (EMLA) cream Apply to affected area once 30 g 3  . LORazepam (ATIVAN) 0.5 MG tablet Take 1 tablet (0.5 mg total) by mouth  every 6 (six) hours as needed (Nausea or vomiting). 30 tablet 0  . losartan (COZAAR) 50 MG tablet Take 50 mg by mouth daily.    . metFORMIN (GLUCOPHAGE) 1000 MG tablet Take 1,000 mg by mouth 2 (two) times daily with a meal.    . Omega-3 Fatty Acids (FISH OIL PO) Take by mouth.    . ondansetron (ZOFRAN) 8 MG tablet Take 1 tablet (8 mg total) by mouth 2 (two) times daily. Start the day after chemo for 3 days. Then take as needed for nausea or vomiting. 30 tablet 1  . pantoprazole (PROTONIX) 40 MG tablet Take 40 mg by mouth daily.    . oxyCODONE-acetaminophen (PERCOCET) 10-325 MG per tablet Take 1 tablet by mouth every 6 (six) hours as needed for pain. (Patient not taking: Reported on 12/10/2014) 20 tablet 0  . prochlorperazine (COMPAZINE) 10 MG tablet Take 1 tablet (10 mg total) by mouth every 6 (six) hours as needed (Nausea or vomiting). (Patient not taking: Reported on  12/10/2014) 30 tablet 1  . tobramycin-dexamethasone (TOBRADEX) ophthalmic solution Place 1 drop into both eyes every 4 (four) hours while awake. (Patient not taking: Reported on 12/24/2014) 5 mL 0   No current facility-administered medications for this visit.    OBJECTIVE: Middle-aged African-American woman in no acute distress Filed Vitals:   12/24/14 1137  BP: 124/65  Pulse: 82  Temp: 97.6 F (36.4 C)  Resp: 18     Body mass index is 36.5 kg/(m^2).    ECOG FS:0 - Asymptomatic  Sclerae unicteric, pupils equal and reactive Oropharynx clear and moist-- no thrush No cervical or supraclavicular adenopathy Lungs no rales or rhonchi Heart regular rate and rhythm Abd soft, nontender, positive bowel sounds MSK no focal spinal tenderness, no upper extremity lymphedema Neuro: nonfocal, well oriented, appropriate affect Breasts: deferred  LAB RESULTS:  CMP     Component Value Date/Time   NA 142 12/24/2014 1109   NA 142 10/16/2014 0848   K 4.2 12/24/2014 1109   K 3.9 10/16/2014 0848   CL 108 10/16/2014 0848   CO2 26 12/24/2014 1109   CO2 29 10/16/2014 0848   GLUCOSE 168* 12/24/2014 1109   GLUCOSE 155* 10/16/2014 0848   BUN 11.5 12/24/2014 1109   BUN 11 10/16/2014 0848   CREATININE 0.7 12/24/2014 1109   CREATININE 0.73 10/16/2014 0848   CALCIUM 9.6 12/24/2014 1109   CALCIUM 9.2 10/16/2014 0848   PROT 6.8 12/24/2014 1109   PROT 7.1 03/08/2014 0500   ALBUMIN 3.7 12/24/2014 1109   ALBUMIN 3.1* 03/08/2014 0500   AST 16 12/24/2014 1109   AST 12 03/08/2014 0500   ALT 11 12/24/2014 1109   ALT 8 03/08/2014 0500   ALKPHOS 107 12/24/2014 1109   ALKPHOS 90 03/08/2014 0500   BILITOT 0.61 12/24/2014 1109   BILITOT 0.6 03/08/2014 0500   GFRNONAA >90 10/16/2014 0848   GFRAA >90 10/16/2014 0848    INo results found for: SPEP, UPEP  Lab Results  Component Value Date   WBC 5.7 12/24/2014   NEUTROABS 3.8 12/24/2014   HGB 10.2* 12/24/2014   HCT 31.2* 12/24/2014   MCV 73.9*  12/24/2014   PLT 249 12/24/2014      Chemistry      Component Value Date/Time   NA 142 12/24/2014 1109   NA 142 10/16/2014 0848   K 4.2 12/24/2014 1109   K 3.9 10/16/2014 0848   CL 108 10/16/2014 0848   CO2 26 12/24/2014 1109   CO2   29 10/16/2014 0848   BUN 11.5 12/24/2014 1109   BUN 11 10/16/2014 0848   CREATININE 0.7 12/24/2014 1109   CREATININE 0.73 10/16/2014 0848      Component Value Date/Time   CALCIUM 9.6 12/24/2014 1109   CALCIUM 9.2 10/16/2014 0848   ALKPHOS 107 12/24/2014 1109   ALKPHOS 90 03/08/2014 0500   AST 16 12/24/2014 1109   AST 12 03/08/2014 0500   ALT 11 12/24/2014 1109   ALT 8 03/08/2014 0500   BILITOT 0.61 12/24/2014 1109   BILITOT 0.6 03/08/2014 0500       No results found for: LABCA2  No components found for: LABCA125  No results for input(s): INR in the last 168 hours.  Urinalysis    Component Value Date/Time   COLORURINE YELLOW 01/14/2007 0708   APPEARANCEUR CLEAR 01/14/2007 0708   LABSPEC 1.017 01/14/2007 0708   PHURINE 6.0 01/14/2007 0708   GLUCOSEU NEGATIVE 01/14/2007 0708   HGBUR NEGATIVE 01/14/2007 0708   BILIRUBINUR NEGATIVE 01/14/2007 0708   KETONESUR NEGATIVE 01/14/2007 0708   PROTEINUR NEGATIVE 01/14/2007 0708   UROBILINOGEN 0.2 01/14/2007 0708   NITRITE NEGATIVE 01/14/2007 0708   LEUKOCYTESUR SMALL* 01/14/2007 0708    STUDIES: No results found.  ASSESSMENT: 58 y.o. [place] woman status post left breast upper outer quadrant biopsy 09/09/2014 for a clinical T1b N0, stage IA invasive ductal carcinoma, grade 2 or 3, weakly estrogen receptor positive, progesterone receptor and HER-2 negative, with an MIB-1 of 35%  (1) status post left lumpectomy and sentinel lymph node sampling 10/01/2014 for a pT1c pN0, stage IA invasive ductal carcinoma, grade 3, repeat HER-2 again negative.  (a) positive posterior margin cleared with subsequent excision 10/21/2014  (2) Mammaprint shows a basal type tumor which is clearly high risk. It  confirms that the tumor is the triple negative   (3) adjuvant chemotherapy starting 11/11/2014 consisting of cyclophosphamide and docetaxel 4, with Neulasta support  (4) adjuvant radiation to follow chemotherapy  (5) the patient has declined genetics testing   PLAN:  Kobe looks and feels well today. The labs were reviewed in detail and were entirely stable. She will proceed with cycle 3 of cyclophosphamide and docetaxel as planned.   I believe her accomodation issues are going to be related to dexamethasone use, but with her history of cataracts, Kinzie wants to be sure. She has already made an appointment with her ophthalmologist.   Preslyn will return in 1 week for labs and a nadir visit. She understands and agrees with this plan. She knows the goal of treatment in her case is cure. She has been encouraged to call with any issues that might arise before her next visit here.   Heather F Boelter, NP   12/24/2014 12:07 PM 

## 2014-12-24 NOTE — Telephone Encounter (Signed)
Flush appointments made and the rad onc referral is noted and they will call her with her appointment

## 2014-12-26 ENCOUNTER — Ambulatory Visit (HOSPITAL_BASED_OUTPATIENT_CLINIC_OR_DEPARTMENT_OTHER): Payer: BC Managed Care – PPO

## 2014-12-26 VITALS — BP 103/58 | HR 92 | Temp 99.8°F

## 2014-12-26 DIAGNOSIS — Z5189 Encounter for other specified aftercare: Secondary | ICD-10-CM

## 2014-12-26 DIAGNOSIS — C50412 Malignant neoplasm of upper-outer quadrant of left female breast: Secondary | ICD-10-CM | POA: Diagnosis not present

## 2014-12-26 MED ORDER — PEGFILGRASTIM INJECTION 6 MG/0.6ML ~~LOC~~
6.0000 mg | PREFILLED_SYRINGE | Freq: Once | SUBCUTANEOUS | Status: AC
  Administered 2014-12-26: 6 mg via SUBCUTANEOUS
  Filled 2014-12-26: qty 0.6

## 2014-12-31 ENCOUNTER — Other Ambulatory Visit (HOSPITAL_BASED_OUTPATIENT_CLINIC_OR_DEPARTMENT_OTHER): Payer: BC Managed Care – PPO

## 2014-12-31 ENCOUNTER — Encounter: Payer: Self-pay | Admitting: Nurse Practitioner

## 2014-12-31 ENCOUNTER — Ambulatory Visit (HOSPITAL_BASED_OUTPATIENT_CLINIC_OR_DEPARTMENT_OTHER): Payer: BC Managed Care – PPO | Admitting: Nurse Practitioner

## 2014-12-31 VITALS — BP 140/71 | HR 107 | Temp 97.9°F | Resp 18 | Ht 63.0 in | Wt 205.5 lb

## 2014-12-31 DIAGNOSIS — C50412 Malignant neoplasm of upper-outer quadrant of left female breast: Secondary | ICD-10-CM

## 2014-12-31 DIAGNOSIS — H409 Unspecified glaucoma: Secondary | ICD-10-CM | POA: Insufficient documentation

## 2014-12-31 LAB — COMPREHENSIVE METABOLIC PANEL (CC13)
ALT: 12 U/L (ref 0–55)
AST: 30 U/L (ref 5–34)
Albumin: 4 g/dL (ref 3.5–5.0)
Alkaline Phosphatase: 137 U/L (ref 40–150)
Anion Gap: 15 meq/L — ABNORMAL HIGH (ref 3–11)
BUN: 8.4 mg/dL (ref 7.0–26.0)
CO2: 23 meq/L (ref 22–29)
Calcium: 10 mg/dL (ref 8.4–10.4)
Chloride: 105 meq/L (ref 98–109)
Creatinine: 0.8 mg/dL (ref 0.6–1.1)
EGFR: 90 ml/min/1.73 m2 — ABNORMAL LOW
Glucose: 115 mg/dL (ref 70–140)
Potassium: 4.2 meq/L (ref 3.5–5.1)
Sodium: 142 meq/L (ref 136–145)
Total Bilirubin: 0.63 mg/dL (ref 0.20–1.20)
Total Protein: 7.4 g/dL (ref 6.4–8.3)

## 2014-12-31 LAB — CBC WITH DIFFERENTIAL/PLATELET
BASO%: 0.8 % (ref 0.0–2.0)
Basophils Absolute: 0.1 10*3/uL (ref 0.0–0.1)
EOS%: 1.4 % (ref 0.0–7.0)
Eosinophils Absolute: 0.2 10*3/uL (ref 0.0–0.5)
HCT: 32 % — ABNORMAL LOW (ref 34.8–46.6)
HGB: 10.4 g/dL — ABNORMAL LOW (ref 11.6–15.9)
LYMPH%: 13.7 % — ABNORMAL LOW (ref 14.0–49.7)
MCH: 23.8 pg — ABNORMAL LOW (ref 25.1–34.0)
MCHC: 32.4 g/dL (ref 31.5–36.0)
MCV: 73.3 fL — ABNORMAL LOW (ref 79.5–101.0)
MONO#: 1.7 10*3/uL — ABNORMAL HIGH (ref 0.1–0.9)
MONO%: 12.9 % (ref 0.0–14.0)
NEUT#: 9.6 10*3/uL — ABNORMAL HIGH (ref 1.5–6.5)
NEUT%: 71.2 % (ref 38.4–76.8)
Platelets: 308 10*3/uL (ref 145–400)
RBC: 4.37 10*6/uL (ref 3.70–5.45)
RDW: 17 % — ABNORMAL HIGH (ref 11.2–14.5)
WBC: 13.5 10*3/uL — ABNORMAL HIGH (ref 3.9–10.3)
lymph#: 1.8 10*3/uL (ref 0.9–3.3)

## 2014-12-31 NOTE — Progress Notes (Signed)
[place]  [phone]) [phone] [phone]) [phone]     ID: Dominique Dillon DOB: [birth year]  MR#: [id number]  PYP#:[id number]  Patient Care Team: Flora Lipps, FNP as PCP - General (Internal Medicine) Deland Pretty, MD as Referring Physician (Internal Medicine) Newton Pigg, MD as Consulting Physician (Obstetrics and Gynecology) Rolm Bookbinder, MD as Consulting Physician (General Surgery) Chauncey Cruel, MD as Consulting Physician (Oncology) Eppie Gibson, MD as Consulting Physician (Radiation Oncology) Rockwell Germany, RN as Registered Nurse Mauro Kaufmann, RN as Registered Nurse Holley Bouche, NP as Nurse Practitioner (Nurse Practitioner) OTHER MD:  CHIEF COMPLAINT: Triple negative breast cancer  CURRENT TREATMENT: Adjuvant chemotherapy  BREAST CANCER HISTORY: From the original intake note:  Dominique Dillon had bilateral screening mammography at [facility] 08/15/2014 showing a possible mass in the left breast. Left diagnostic mammography and [facility]2016 showed the breast density to be category B. There was a persistent spiculated mass in the upper outer quadrant of the left breast, which by ultrasonography measured 0.74 cm. Ultrasound of the left axilla was negative. Biopsy of the left breast mass in question 09/09/2014 showed ([id number]) and invasive ductal carcinoma, grade 2 or 3, E-cadherin positive, estrogen receptor 11% positive with weak staining intensity, progesterone receptor negative, HER-2 negative, with an MIB-1 of 35%.  The patient's subsequent history is as detailed below  INTERVAL HISTORY: Dominique Dillon returns today for follow-up of her triple negative breast cancer. Today is day 8, cycle 3 of 4 planned cycles of cyclophosphamide and docetaxel, with Neulasta support on day 2  REVIEW OF SYSTEMS: Dominique Dillon visited her ophthalmologist who believes her glaucoma has worsened since the start of chemotherapy. 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 She is feeling pressure from her boss to continue driving anyway and she is stressed as a result. She was tearful during this part of our visit. She received a massage yesterday and was told that she was exceptionally tight. She slept well afterwards. Dominique Dillon denies fevers, chills, nausea, vomiting, or changes in bowel or bladder habits. Her appetite is healthy despite taste changes.  She denies mouth sores, rashes, or neuropathy symptoms. A detailed review of systems is otherwise stable.   PAST MEDICAL HISTORY: Past Medical History  Diagnosis Date  . GERD (gastroesophageal reflux disease)   . Obesity   . Breast cancer of upper-outer quadrant of left female breast 09/12/2014  . History of anemia     still takes iron supplement  . Arthritis     knees  . Hypertension     under control with med., has been on med. since 1990s  . Insulin dependent diabetes mellitus   . Seasonal asthma     no current med.  . DDD (degenerative disc disease), cervical     PAST SURGICAL HISTORY: Past Surgical History  Procedure Laterality Date  . Cesarean section      x 2  . Knee arthroscopy Bilateral   . Abdominal hysterectomy      complete  . Cholecystectomy    . Appendectomy    . Tonsillectomy    . Irrigation and debridement abscess N/A 03/08/2014    Procedure: IRRIGATION AND DEBRIDEMENT ABDOMINAL WALL ABSCESS;  Surgeon: Adin Hector, MD;  Location: [facility];  Service: General;  Laterality: N/A;  . Colonoscopy    . Upper gi endoscopy    .  Radioactive seed guided mastectomy with axillary sentinel lymph node biopsy Left 10/01/2014    Procedure: RADIOACTIVE SEED GUIDED LEFT BREAST LUMPECTOMY WITH LEFT  AXILLARY SENTINEL LYMPH NODE BIOPSY;  Surgeon: Rolm Bookbinder, MD;  Location: [place];  Service: General;  Laterality:  Left;  . Carpal tunnel release Right 08/06/2004  . Trigger finger release Right 08/06/2004    middle finger  . Portacath placement Right 10/21/2014    Procedure: INSERTION PORT-A-CATH;  Surgeon: Rolm Bookbinder, MD;  Location: [place];  Service: General;  Laterality: Right;  . Re-excision of breast lumpectomy Left 10/21/2014    Procedure: RE-EXCISION OF BREAST CANCER, POSTERIOR MARGINS;  Surgeon: Rolm Bookbinder, MD;  Location: [place];  Service: General;  Laterality: Left;    FAMILY HISTORY Family History  Problem Relation Age of Onset  . Prostate cancer Father   . Stomach cancer Maternal Aunt   . Pancreatic cancer Maternal Uncle   . Lung cancer Maternal Grandmother   . Breast cancer Maternal Aunt   . Pancreatic cancer Maternal Aunt   . Pancreatic cancer Maternal Uncle    there is significant stomach (one case) and pancreatic cancer (3 cases) on the maternal side. There is also a history of breast cancer on the maternal side(1 and diagnosed at age 71). The patient's father died the age of 28 with prostate cancer  GYNECOLOGIC HISTORY:  No LMP recorded. Patient has had a hysterectomy. Menarche age 29, first live birth age 17, the patient is G X P2. She underwent hysterectomy with salpingo-oophorectomy in 1995. She did not take hormone replacement.  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 She is widowed. At home she lives with her adopted daughter [name]. The patient's 2 biologic daughter are [name] who works as a Recruitment consultant and [name] who works as a Land, both in [place]. The patient also has a goddaughter, [name], whom she help raise and whom she considers as a daughter    ADVANCED DIRECTIVES: Not in place. At her to 05/29/2015 visit the patient was given the appropriate documents 2 complete and notarize at her discretion. She tells me she intends to name her daughter [name]  as her healthcare power of attorney. 68 can be reached at [place]: History  Substance Use Topics  . Smoking status: Never Smoker   . Smokeless tobacco: Never Used  . Alcohol Use: No     Colonoscopy: [place], 2011  PAP:  Bone density: Remote  Lipid panel:  Allergies  Allergen Reactions  . Biaxin [Clarithromycin] Other (See Comments)    UNKNOWN    Current Outpatient Prescriptions  Medication Sig Dispense Refill  . aspirin 81 MG tablet Take 81 mg by mouth daily.    . Calcium Carbonate (CALCIUM 600 PO) Take by mouth.    . cetirizine (ZYRTEC) 10 MG chewable tablet Chew 10 mg by mouth daily.    . Cholecalciferol (VITAMIN D) 1000 UNITS capsule Take 1,000 Units by mouth daily.    . ferrous sulfate 325 (65 FE) MG tablet Take 325 mg by mouth daily with breakfast.    . hydrochlorothiazide (HYDRODIURIL) 25 MG tablet Take 25 mg by mouth every other day.     . insulin lispro protamine-lispro (HUMALOG 75/25 MIX) (75-25) 100 UNIT/ML SUSP injection Inject 30 Units into the skin 2 (two) times daily with a meal. Humalog 70/30.    Marland Kitchen LORazepam (ATIVAN) 0.5 MG  tablet Take 1 tablet (0.5 mg total) by mouth every 6 (six) hours as needed (Nausea or vomiting). 30 tablet 0  . losartan (COZAAR) 50 MG tablet Take 50 mg by mouth daily.    . metFORMIN (GLUCOPHAGE) 1000 MG tablet Take 1,000 mg by mouth 2 (two) times daily with a meal.    . Omega-3 Fatty Acids (FISH OIL PO) Take by mouth.    . ondansetron (ZOFRAN) 8 MG tablet Take 1 tablet (8 mg total) by mouth 2 (two) times daily. Start the day after chemo for 3 days. Then take as needed for nausea or vomiting. 30 tablet 1  . pantoprazole (PROTONIX) 40 MG tablet Take 40 mg by mouth daily.    . prochlorperazine (COMPAZINE) 10 MG tablet Take 1 tablet (10 mg total) by mouth every 6 (six) hours as needed (Nausea or vomiting). 30 tablet 1  . Propylene Glycol (SYSTANE BALANCE OP) Apply 1 drop to eye 4 (four) times daily. Pt uses this medication  in both eyes four times daily.    Marland Kitchen lidocaine-prilocaine (EMLA) cream Apply to affected area once (Patient not taking: Reported on 12/31/2014) 30 g 3  . oxyCODONE-acetaminophen (PERCOCET) 10-325 MG per tablet Take 1 tablet by mouth every 6 (six) hours as needed for pain. (Patient not taking: Reported on 12/10/2014) 20 tablet 0  . tobramycin-dexamethasone (TOBRADEX) ophthalmic solution Place 1 drop into both eyes every 4 (four) hours while awake. (Patient not taking: Reported on 12/24/2014) 5 mL 0   No current facility-administered medications for this visit.    OBJECTIVE: Middle-aged African-American woman in no acute distress Filed Vitals:   12/31/14 1421  BP: 140/71  Pulse: 107  Temp: 97.9 F (36.6 C)  Resp: 18     Body mass index is 36.41 kg/(m^2).    ECOG FS:0 - Asymptomatic  Skin: warm, dry  HEENT: sclerae anicteric, conjunctivae pink, oropharynx clear. No thrush or mucositis.  Lymph Nodes: No cervical or supraclavicular lymphadenopathy  Lungs: clear to auscultation bilaterally, no rales, wheezes, or rhonci  Heart: regular rate and rhythm  Abdomen: round, soft, non tender, positive bowel sounds  Musculoskeletal: No focal spinal tenderness, no peripheral edema  Neuro: non focal, well oriented, positive affect  Breasts: deferred  LAB RESULTS:  CMP     Component Value Date/Time   NA 142 12/31/2014 1406   NA 142 10/16/2014 0848   K 4.2 12/31/2014 1406   K 3.9 10/16/2014 0848   CL 108 10/16/2014 0848   CO2 23 12/31/2014 1406   CO2 29 10/16/2014 0848   GLUCOSE 115 12/31/2014 1406   GLUCOSE 155* 10/16/2014 0848   BUN 8.4 12/31/2014 1406   BUN 11 10/16/2014 0848   CREATININE 0.8 12/31/2014 1406   CREATININE 0.73 10/16/2014 0848   CALCIUM 10.0 12/31/2014 1406   CALCIUM 9.2 10/16/2014 0848   PROT 7.4 12/31/2014 1406   PROT 7.1 03/08/2014 0500   ALBUMIN 4.0 12/31/2014 1406   ALBUMIN 3.1* 03/08/2014 0500   AST 30 12/31/2014 1406   AST 12 03/08/2014 0500   ALT 12 12/31/2014  1406   ALT 8 03/08/2014 0500   ALKPHOS 137 12/31/2014 1406   ALKPHOS 90 03/08/2014 0500   BILITOT 0.63 12/31/2014 1406   BILITOT 0.6 03/08/2014 0500   GFRNONAA >90 10/16/2014 0848   GFRAA >90 10/16/2014 0848    INo results found for: SPEP, UPEP  Lab Results  Component Value Date   WBC 13.5* 12/31/2014   NEUTROABS 9.6* 12/31/2014   HGB 10.4*  12/31/2014   HCT 32.0* 12/31/2014   MCV 73.3* 12/31/2014   PLT 308 12/31/2014      Chemistry      Component Value Date/Time   NA 142 12/31/2014 1406   NA 142 10/16/2014 0848   K 4.2 12/31/2014 1406   K 3.9 10/16/2014 0848   CL 108 10/16/2014 0848   CO2 23 12/31/2014 1406   CO2 29 10/16/2014 0848   BUN 8.4 12/31/2014 1406   BUN 11 10/16/2014 0848   CREATININE 0.8 12/31/2014 1406   CREATININE 0.73 10/16/2014 0848      Component Value Date/Time   CALCIUM 10.0 12/31/2014 1406   CALCIUM 9.2 10/16/2014 0848   ALKPHOS 137 12/31/2014 1406   ALKPHOS 90 03/08/2014 0500   AST 30 12/31/2014 1406   AST 12 03/08/2014 0500   ALT 12 12/31/2014 1406   ALT 8 03/08/2014 0500   BILITOT 0.63 12/31/2014 1406   BILITOT 0.6 03/08/2014 0500       No results found for: LABCA2  No components found for: [id number]  No results for input(s): INR in the last 168 hours.  Urinalysis    Component Value Date/Time   COLORURINE YELLOW 01/14/2007 0708   APPEARANCEUR CLEAR 01/14/2007 0708   LABSPEC 1.017 01/14/2007 0708   PHURINE 6.0 01/14/2007 0708   GLUCOSEU NEGATIVE 01/14/2007 0708   HGBUR NEGATIVE 01/14/2007 0708   BILIRUBINUR NEGATIVE 01/14/2007 0708   KETONESUR NEGATIVE 01/14/2007 0708   PROTEINUR NEGATIVE 01/14/2007 0708   UROBILINOGEN 0.2 01/14/2007 0708   NITRITE NEGATIVE 01/14/2007 0708   LEUKOCYTESUR SMALL* 01/14/2007 0708    STUDIES: No results found.  ASSESSMENT: 59 y.o. Dominique Dillon woman status post left breast upper outer quadrant biopsy 09/09/2014 for a clinical T1b N0, stage IA invasive ductal carcinoma, grade 2 or 3, weakly  estrogen receptor positive, progesterone receptor and HER-2 negative, with an MIB-1 of 35%  (1) status post left lumpectomy and sentinel lymph node sampling 10/01/2014 for a pT1c pN0, stage IA invasive ductal carcinoma, grade 3, repeat HER-2 again negative.  (a) positive posterior margin cleared with subsequent excision 10/21/2014  (2) Mammaprint shows a basal type tumor which is clearly high risk. It confirms that the tumor is the triple negative   (3) adjuvant chemotherapy starting 11/11/2014 consisting of cyclophosphamide and docetaxel 4, with Neulasta support  (4) adjuvant radiation to follow chemotherapy  (5) the patient has declined genetics testing   PLAN:  Physically, Sreya is doing well beside her eye issue. The labs were reviewed in detail and were entirely stable. She had no major complications with the 3rd cycle of treatment. She returns the ophthalmologist next week for re-evaluation.  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   Mentally, she needs a break from stress. She plans to head to the beach with some girlfriends this weekend, and the massage she received yesterday was also helpful.   Lainie will return in 2 week for the start of her 4th and final cycle of cyclophosphamide and docetaxel. She understands and agrees with this plan. She knows the goal of treatment in her case is cure. She has been encouraged to call with any issues that might arise before her next visit here.    Laurie Panda, NP   12/31/2014 3:13 PM

## 2015-01-01 ENCOUNTER — Other Ambulatory Visit: Payer: Self-pay | Admitting: *Deleted

## 2015-01-08 ENCOUNTER — Encounter: Payer: Self-pay | Admitting: Radiation Oncology

## 2015-01-08 ENCOUNTER — Ambulatory Visit
Admission: RE | Admit: 2015-01-08 | Discharge: 2015-01-08 | Disposition: A | Discharge status: Home / Self Care | Payer: BC Managed Care – PPO | Source: Ambulatory Visit | Attending: Radiation Oncology | Admitting: Radiation Oncology

## 2015-01-08 ENCOUNTER — Other Ambulatory Visit: Payer: Self-pay | Admitting: Radiation Oncology

## 2015-01-08 ENCOUNTER — Ambulatory Visit (HOSPITAL_COMMUNITY)
Admission: RE | Admit: 2015-01-08 | Discharge: 2015-01-08 | Disposition: A | Discharge status: Home / Self Care | Payer: BC Managed Care – PPO | Source: Ambulatory Visit | Attending: Radiation Oncology | Admitting: Radiation Oncology

## 2015-01-08 VITALS — BP 114/76 | HR 77 | Temp 99.0°F | Resp 12 | Wt 210.0 lb

## 2015-01-08 DIAGNOSIS — Z510 Encounter for antineoplastic radiation therapy: Secondary | ICD-10-CM | POA: Diagnosis not present

## 2015-01-08 DIAGNOSIS — Z7982 Long term (current) use of aspirin: Secondary | ICD-10-CM | POA: Diagnosis not present

## 2015-01-08 DIAGNOSIS — I10 Essential (primary) hypertension: Secondary | ICD-10-CM | POA: Diagnosis not present

## 2015-01-08 DIAGNOSIS — Z800 Family history of malignant neoplasm of digestive organs: Secondary | ICD-10-CM | POA: Diagnosis not present

## 2015-01-08 DIAGNOSIS — E118 Type 2 diabetes mellitus with unspecified complications: Secondary | ICD-10-CM | POA: Insufficient documentation

## 2015-01-08 DIAGNOSIS — D649 Anemia, unspecified: Secondary | ICD-10-CM | POA: Insufficient documentation

## 2015-01-08 DIAGNOSIS — M79609 Pain in unspecified limb: Secondary | ICD-10-CM | POA: Diagnosis not present

## 2015-01-08 DIAGNOSIS — R609 Edema, unspecified: Secondary | ICD-10-CM | POA: Diagnosis not present

## 2015-01-08 DIAGNOSIS — H409 Unspecified glaucoma: Secondary | ICD-10-CM | POA: Insufficient documentation

## 2015-01-08 DIAGNOSIS — L0390 Cellulitis, unspecified: Secondary | ICD-10-CM | POA: Insufficient documentation

## 2015-01-08 DIAGNOSIS — Z801 Family history of malignant neoplasm of trachea, bronchus and lung: Secondary | ICD-10-CM | POA: Diagnosis not present

## 2015-01-08 DIAGNOSIS — Z803 Family history of malignant neoplasm of breast: Secondary | ICD-10-CM | POA: Insufficient documentation

## 2015-01-08 DIAGNOSIS — E669 Obesity, unspecified: Secondary | ICD-10-CM | POA: Diagnosis not present

## 2015-01-08 DIAGNOSIS — M7989 Other specified soft tissue disorders: Secondary | ICD-10-CM | POA: Insufficient documentation

## 2015-01-08 DIAGNOSIS — Z794 Long term (current) use of insulin: Secondary | ICD-10-CM | POA: Insufficient documentation

## 2015-01-08 DIAGNOSIS — C50412 Malignant neoplasm of upper-outer quadrant of left female breast: Secondary | ICD-10-CM | POA: Diagnosis present

## 2015-01-08 DIAGNOSIS — L02211 Cutaneous abscess of abdominal wall: Secondary | ICD-10-CM | POA: Diagnosis not present

## 2015-01-08 NOTE — Progress Notes (Signed)
Radiation Oncology         [phone] ________________________________  Name: Dominique Dillon MRN: [id number]  Date: 01/08/2015  DOB: 10/02/1955  Follow-Up Visit Note  outpatient  CC: PREVOST-GILBERT,MARY, FNP  Magrinat, Virgie Dad, MD  Diagnosis and Prior Radiotherapy:    ICD-9-CM ICD-10-CM   1. Breast cancer of upper-outer quadrant of left female breast 174.4 C50.412 Ultrasound doppler venous legs bilat   Stage I, pT1cN0 cM0 left breast invasive ductal carcinoma, with DCIS, ER 11%, PR and Her2 negative. Grade III.  Narrative:  The patient returns today for routine follow-up. Patient underwent lumpectomy sentinel node bx which revealed a 1.4 cm tumor and two negative nodes, re-excision was performed for positive margins. No additional tumor was revealed. She is received adjuvant chemotherapy in the form of cyclophosphamide and docetaxel x4. "Legs are just hurting so bad for some reason. I can hardly walk, especially back here in the back of this one," patient stated this symptom occurred after last treatment, referring that pain is worst in the R calf area. No swelling was found. Patient repots mucus build up in the throat.      ALLERGIES:  is allergic to biaxin.  Meds: Current Outpatient Prescriptions  Medication Sig Dispense Refill  . aspirin 81 MG tablet Take 81 mg by mouth daily.    . Calcium Carbonate (CALCIUM 600 PO) Take by mouth.    . cetirizine (ZYRTEC) 10 MG chewable tablet Chew 10 mg by mouth daily.    . Cholecalciferol (VITAMIN D) 1000 UNITS capsule Take 1,000 Units by mouth daily.    . ferrous sulfate 325 (65 FE) MG tablet Take 325 mg by mouth daily with breakfast.    . hydrochlorothiazide (HYDRODIURIL) 25 MG tablet Take 25 mg by mouth every other day.     . insulin lispro protamine-lispro (HUMALOG 75/25 MIX) (75-25) 100 UNIT/ML SUSP injection Inject 30 Units into the skin 2 (two) times daily with a meal. Humalog 70/30.    Marland Kitchen lidocaine-prilocaine (EMLA) cream Apply to  affected area once (Patient not taking: Reported on 12/31/2014) 30 g 3  . LORazepam (ATIVAN) 0.5 MG tablet Take 1 tablet (0.5 mg total) by mouth every 6 (six) hours as needed (Nausea or vomiting). 30 tablet 0  . losartan (COZAAR) 50 MG tablet Take 50 mg by mouth daily.    . metFORMIN (GLUCOPHAGE) 1000 MG tablet Take 1,000 mg by mouth 2 (two) times daily with a meal.    . Omega-3 Fatty Acids (FISH OIL PO) Take by mouth.    . ondansetron (ZOFRAN) 8 MG tablet Take 1 tablet (8 mg total) by mouth 2 (two) times daily. Start the day after chemo for 3 days. Then take as needed for nausea or vomiting. 30 tablet 1  . oxyCODONE-acetaminophen (PERCOCET) 10-325 MG per tablet Take 1 tablet by mouth every 6 (six) hours as needed for pain. (Patient not taking: Reported on 12/10/2014) 20 tablet 0  . pantoprazole (PROTONIX) 40 MG tablet Take 40 mg by mouth daily.    . prochlorperazine (COMPAZINE) 10 MG tablet Take 1 tablet (10 mg total) by mouth every 6 (six) hours as needed (Nausea or vomiting). 30 tablet 1  . Propylene Glycol (SYSTANE BALANCE OP) Apply 1 drop to eye 4 (four) times daily. Pt uses this medication in both eyes four times daily.    Marland Kitchen tobramycin-dexamethasone (TOBRADEX) ophthalmic solution Place 1 drop into both eyes every 4 (four) hours while awake. (Patient not taking: Reported on 12/24/2014) 5 mL 0  No current facility-administered medications for this encounter.    Physical Findings: The patient is in no acute distress. Patient is alert and oriented.  weight is 210 lb (95.255 kg). Her oral temperature is 99 F (37.2 C). Her blood pressure is 114/76 and her pulse is 77. Her respiration is 12 and oxygen saturation is 100%. .     General: Alert and oriented, in no acute distress HEENT: Head is normocephalic. Extraocular movements are intact. Oropharynx is clear. Neck: Neck is supple, no palpable cervical or supraclavicular lymphadenopathy. Heart: Regular in rate and rhythm with no murmurs, rubs, or  gallops. Chest: Clear to auscultation bilaterally, with no rhonchi, wheezes, or rales. Right upper chest porticath. Abdomen: Soft, nontender, nondistended, with no rigidity or guarding. Extremities: No cyanosis or edema. Palpable superficial veins in the right calf. Reports tenderness of legs is alleviated by palpation. Lymphatics: see Neck Exam Skin: No concerning lesions. Musculoskeletal: symmetric strength and muscle tone throughout. Neurologic: Cranial nerves II through XII are grossly intact. No obvious focalities. Speech is fluent. Coordination is intact. Psychiatric: Judgment and insight are intact. Affect is appropriate. Expressed no symptoms of depression. Breast: Upper outer quadrant of the left breast, well healed lumpectomy scar.     Lab Findings: Lab Results  Component Value Date   WBC 13.5* 12/31/2014   HGB 10.4* 12/31/2014   HCT 32.0* 12/31/2014   MCV 73.3* 12/31/2014   PLT 308 12/31/2014    Radiographic Findings: No results found.  Impression/Plan:  STAT bilateral ultrasound of legs ordered on 01/08/2015, to address pain in the calves and rule out possible blood clot. Sx Could be due to Bone Marrow stimulation shot, but will rule out DVTs.   CT sim and radiation treatment planned for 02/03/2015. Consent form signed. Anticipate 6-7 weeks of treatment to the left breast.     This document serves as a record of services personally performed by Eppie Gibson, MD. It was created on her behalf by Lenn Cal, a trained medical scribe. The creation of this record is based on the scribe's personal observations and the provider's statements to them. This document has been checked and approved by the attending provider.  _____________________________________   Eppie Gibson, MD

## 2015-01-08 NOTE — Progress Notes (Signed)
Location of Breast Cancer:  Left breast   Histology per Pathology Report:  Diagnosis Breast, excision, left - BENIGN FIBROFATTY SOFT TISSUE. - FOCAL FAT NECROSIS WITH ASSOCIATED CHRONIC INFLAMMATION AND GIANT CELL REACTION. - NO ATYPIA OR TUMOR SEEN.  Receptor Status: ER(11%+), PR (-), Her2-neu (-)  Did patient present with symptoms (if so, please note symptoms) or was this found on screening mammography?: Mammogram January 2016  Past/Anticipated interventions by surgeon, if any: RADIOACTIVE SEED GUIDED PARTIAL MASTECTOMY WITH AXILLARY SENTINEL LYMPH NODE BIOPSY  Past/Anticipated interventions by medical oncology, if any: Chemotherapy   Lymphedema issues, if any: No  Pain issues, if any: No  SAFETY ISSUES:  Prior radiation? no  Pacemaker/ICD? no  Possible current pregnancy?no  Is the patient on methotrexate? no  Current Complaints / other details:  Reports bilateral leg pain rating it a 7     Dominique Dillon, Donna Christen, RN 01/08/2015,9:38 AM

## 2015-01-08 NOTE — Progress Notes (Signed)
Bilateral lower extremity venous duplex completed:  No evidence of DVT, superficial thrombosis, or Baker's cyst.   

## 2015-01-08 NOTE — Addendum Note (Signed)
Encounter addended by: Jenene Slicker, RN on: 01/08/2015  1:07 PM<BR>     Documentation filed: Arn Medal VN

## 2015-01-14 ENCOUNTER — Encounter: Payer: Self-pay | Admitting: *Deleted

## 2015-01-14 ENCOUNTER — Ambulatory Visit (HOSPITAL_BASED_OUTPATIENT_CLINIC_OR_DEPARTMENT_OTHER): Payer: BC Managed Care – PPO | Admitting: Nurse Practitioner

## 2015-01-14 ENCOUNTER — Ambulatory Visit (HOSPITAL_BASED_OUTPATIENT_CLINIC_OR_DEPARTMENT_OTHER): Payer: BC Managed Care – PPO

## 2015-01-14 ENCOUNTER — Other Ambulatory Visit (HOSPITAL_BASED_OUTPATIENT_CLINIC_OR_DEPARTMENT_OTHER): Payer: BC Managed Care – PPO

## 2015-01-14 ENCOUNTER — Ambulatory Visit: Payer: BC Managed Care – PPO

## 2015-01-14 ENCOUNTER — Encounter: Payer: Self-pay | Admitting: Nurse Practitioner

## 2015-01-14 ENCOUNTER — Other Ambulatory Visit: Payer: BC Managed Care – PPO

## 2015-01-14 VITALS — BP 137/78 | HR 89 | Temp 97.8°F | Resp 20 | Wt 208.3 lb

## 2015-01-14 DIAGNOSIS — C50412 Malignant neoplasm of upper-outer quadrant of left female breast: Secondary | ICD-10-CM

## 2015-01-14 DIAGNOSIS — D6481 Anemia due to antineoplastic chemotherapy: Secondary | ICD-10-CM | POA: Diagnosis not present

## 2015-01-14 DIAGNOSIS — Z452 Encounter for adjustment and management of vascular access device: Secondary | ICD-10-CM

## 2015-01-14 DIAGNOSIS — Z170 Estrogen receptor positive status [ER+]: Secondary | ICD-10-CM | POA: Diagnosis not present

## 2015-01-14 DIAGNOSIS — Z5111 Encounter for antineoplastic chemotherapy: Secondary | ICD-10-CM

## 2015-01-14 LAB — COMPREHENSIVE METABOLIC PANEL (CC13)
ALBUMIN: 3.6 g/dL (ref 3.5–5.0)
ALT: 9 U/L (ref 0–55)
AST: 17 U/L (ref 5–34)
Alkaline Phosphatase: 97 U/L (ref 40–150)
Anion Gap: 8 mEq/L (ref 3–11)
BUN: 15.3 mg/dL (ref 7.0–26.0)
CALCIUM: 9.5 mg/dL (ref 8.4–10.4)
CHLORIDE: 106 meq/L (ref 98–109)
CO2: 27 meq/L (ref 22–29)
Creatinine: 0.7 mg/dL (ref 0.6–1.1)
EGFR: >90 mL/min/{1.73_m2} (ref >90)
Glucose: 144 mg/dl — ABNORMAL HIGH (ref 70–140)
Potassium: 4.2 mEq/L (ref 3.5–5.1)
SODIUM: 140 meq/L (ref 136–145)
Total Bilirubin: 0.55 mg/dL (ref 0.20–1.20)
Total Protein: 6.6 g/dL (ref 6.4–8.3)

## 2015-01-14 LAB — CBC WITH DIFFERENTIAL/PLATELET
BASO%: 0.4 % (ref 0.0–2.0)
BASOS ABS: 0 10*3/uL (ref 0.0–0.1)
EOS ABS: 0 10*3/uL (ref 0.0–0.5)
EOS%: 0.7 % (ref 0.0–7.0)
HCT: 29.3 % — ABNORMAL LOW (ref 34.8–46.6)
HEMOGLOBIN: 9.5 g/dL — AB (ref 11.6–15.9)
LYMPH%: 22.5 % (ref 14.0–49.7)
MCH: 24.5 pg — AB (ref 25.1–34.0)
MCHC: 32.4 g/dL (ref 31.5–36.0)
MCV: 75.5 fL — ABNORMAL LOW (ref 79.5–101.0)
MONO#: 0.7 10*3/uL (ref 0.1–0.9)
MONO%: 12.1 % (ref 0.0–14.0)
NEUT#: 3.6 10*3/uL (ref 1.5–6.5)
NEUT%: 64.3 % (ref 38.4–76.8)
PLATELETS: 269 10*3/uL (ref 145–400)
RBC: 3.88 10*6/uL (ref 3.70–5.45)
RDW: 17.8 % — ABNORMAL HIGH (ref 11.2–14.5)
WBC: 5.6 10*3/uL (ref 3.9–10.3)
lymph#: 1.3 10*3/uL (ref 0.9–3.3)

## 2015-01-14 MED ORDER — DOCETAXEL CHEMO INJECTION 160 MG/16ML
75.0000 mg/m2 | Freq: Once | INTRAVENOUS | Status: AC
  Administered 2015-01-14: 150 mg via INTRAVENOUS
  Filled 2015-01-14: qty 15

## 2015-01-14 MED ORDER — SODIUM CHLORIDE 0.9 % IJ SOLN
10.0000 mL | INTRAMUSCULAR | Status: DC | PRN
  Administered 2015-01-14 (×2): 10 mL
  Filled 2015-01-14: qty 10

## 2015-01-14 MED ORDER — SODIUM CHLORIDE 0.9 % IV SOLN
Freq: Once | INTRAVENOUS | Status: AC
  Administered 2015-01-14: 12:00:00 via INTRAVENOUS

## 2015-01-14 MED ORDER — HEPARIN SOD (PORK) LOCK FLUSH 100 UNIT/ML IV SOLN
500.0000 [IU] | Freq: Once | INTRAVENOUS | Status: AC | PRN
  Administered 2015-01-14: 500 [IU]
  Filled 2015-01-14: qty 5

## 2015-01-14 MED ORDER — SODIUM CHLORIDE 0.9 % IV SOLN
600.0000 mg/m2 | Freq: Once | INTRAVENOUS | Status: AC
  Administered 2015-01-14: 1220 mg via INTRAVENOUS
  Filled 2015-01-14: qty 61

## 2015-01-14 MED ORDER — SODIUM CHLORIDE 0.9 % IV SOLN
Freq: Once | INTRAVENOUS | Status: AC
  Administered 2015-01-14: 13:00:00 via INTRAVENOUS
  Filled 2015-01-14: qty 8

## 2015-01-14 NOTE — Patient Instructions (Signed)
[facility] Discharge Instructions for Patients Receiving Chemotherapy  Today you received the following chemotherapy agents Taxotere/Cytoxan To help prevent nausea and vomiting after your treatment, we encourage you to take your nausea medication as prescribed.   If you develop nausea and vomiting that is not controlled by your nausea medication, call the clinic.   BELOW ARE SYMPTOMS THAT SHOULD BE REPORTED IMMEDIATELY:  *FEVER GREATER THAN 100.5 F  *CHILLS WITH OR WITHOUT FEVER  NAUSEA AND VOMITING THAT IS NOT CONTROLLED WITH YOUR NAUSEA MEDICATION  *UNUSUAL SHORTNESS OF BREATH  *UNUSUAL BRUISING OR BLEEDING  TENDERNESS IN MOUTH AND THROAT WITH OR WITHOUT PRESENCE OF ULCERS  *URINARY PROBLEMS  *BOWEL PROBLEMS  UNUSUAL RASH Items with * indicate a potential emergency and should be followed up as soon as possible.  Feel free to call the clinic you have any questions or concerns. The clinic phone number is [phone].  Please show the CHEMO ALERT CARD at check-in to the Emergency Department and triage nurse.   

## 2015-01-14 NOTE — Patient Instructions (Signed)

## 2015-01-14 NOTE — Progress Notes (Signed)
[place]  [phone] [phone]     ID: Dominique Dillon DOB: 10/13/1955  MR#: [id number]  DZH#:[id number]  Patient Care Team: Flora Lipps, FNP as PCP - General (Internal Medicine) Deland Pretty, MD as Referring Physician (Internal Medicine) Newton Pigg, MD as Consulting Physician (Obstetrics and Gynecology) Rolm Bookbinder, MD as Consulting Physician (General Surgery) Chauncey Cruel, MD as Consulting Physician (Oncology) Eppie Gibson, MD as Consulting Physician (Radiation Oncology) Rockwell Germany, RN as Registered Nurse Mauro Kaufmann, RN as Registered Nurse Holley Bouche, NP as Nurse Practitioner (Nurse Practitioner) OTHER MD:  CHIEF COMPLAINT: Triple negative breast cancer  CURRENT TREATMENT: Adjuvant chemotherapy  BREAST CANCER HISTORY: From the original intake note:  Dominique Dillon had bilateral screening mammography at [facility] 08/15/2014 showing a possible mass in the left breast. Left diagnostic mammography [facility] showed the breast density to be category B. There was a persistent spiculated mass in the upper outer quadrant of the left breast, which by ultrasonography measured 0.74 cm. Ultrasound of the left axilla was negative. Biopsy of the left breast mass in question 09/09/2014 showed ([id number]) and invasive ductal carcinoma, grade 2 or 3, E-cadherin positive, estrogen receptor 11% positive with weak staining intensity, progesterone receptor negative, HER-2 negative, with an MIB-1 of 35%.  The patient's subsequent history is as detailed below  INTERVAL HISTORY: Dominique Dillon returns today for follow-up of her triple negative breast cancer. Today is day 1, cycle 4 of 4 planned cycles of cyclophosphamide and docetaxel, with Neulasta support on day 2  REVIEW OF SYSTEMS: Dominique Dillon's return visit to her ophthalmologist was productive. She was restarted on restasis. Her eye pressure has  increased somewhat and she will return every 2 months for monitoring. Otherwise she is doing well. She denies fevers, chills, nausea, vomiting, or changes in bowel or bladder habits. She takes aleve for her bone pain after neulasta. She is somewhat more fatigued this week. She is sleeping well. Her appetite is healthy. She denies mouth sores, rashes, or neuropathy symptoms. A detailed review of systems is otherwise stable.  PAST MEDICAL HISTORY: Past Medical History  Diagnosis Date  . GERD (gastroesophageal reflux disease)   . Obesity   . Breast cancer of upper-outer quadrant of left female breast 09/12/2014  . History of anemia     still takes iron supplement  . Arthritis     knees  . Hypertension     under control with med., has been on med. since 1990s  . Insulin dependent diabetes mellitus   . Seasonal asthma     no current med.  . DDD (degenerative disc disease), cervical     PAST SURGICAL HISTORY: Past Surgical History  Procedure Laterality Date  . Cesarean section      x 2  . Knee arthroscopy Bilateral   . Abdominal hysterectomy      complete  . Cholecystectomy    . Appendectomy    . Tonsillectomy    . Irrigation and debridement abscess N/A 03/08/2014    Procedure: IRRIGATION AND DEBRIDEMENT ABDOMINAL WALL ABSCESS;  Surgeon: Adin Hector, MD;  Location: [facility];  Service: General;  Laterality: N/A;  . Colonoscopy    . Upper gi endoscopy    . Radioactive seed guided mastectomy with axillary sentinel lymph node biopsy Left 10/01/2014    Procedure: RADIOACTIVE SEED GUIDED LEFT BREAST LUMPECTOMY WITH LEFT  AXILLARY SENTINEL LYMPH NODE BIOPSY;  Surgeon: Rolm Bookbinder, MD;  Location: [facility];  Service: General;  Laterality: Left;  . Carpal tunnel release Right 08/06/2004  . Trigger finger release Right 08/06/2004    middle finger  . Portacath placement Right 10/21/2014    Procedure: INSERTION PORT-A-CATH;  Surgeon: Rolm Bookbinder, MD;  Location: [facility];  Service: General;  Laterality: Right;  . Re-excision of breast lumpectomy Left 10/21/2014    Procedure: RE-EXCISION OF BREAST CANCER, POSTERIOR MARGINS;  Surgeon: Rolm Bookbinder, MD;  Location: [facility];  Service: General;  Laterality: Left;    FAMILY HISTORY Family History  Problem Relation Age of Onset  . Prostate cancer Father   . Stomach cancer Maternal Aunt   . Pancreatic cancer Maternal Uncle   . Lung cancer Maternal Grandmother   . Breast cancer Maternal Aunt   . Pancreatic cancer Maternal Aunt   . Pancreatic cancer Maternal Uncle    there is significant stomach (one case) and pancreatic cancer (3 cases) on the maternal side. There is also a history of breast cancer on the maternal side(1 and diagnosed at age 52). The patient's father died the age of 35 with prostate cancer  GYNECOLOGIC HISTORY:  No LMP recorded. Patient has had a hysterectomy. Menarche age 47, first live birth age 34, the patient is G X P2. She underwent hysterectomy with salpingo-oophorectomy in 1995. She did not take hormone replacement.  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 She is widowed. At home she lives with her adopted daughter Judyth Demarais. The patient's 2 biologic daughter are Laurielle Selmon who works as a Recruitment consultant and Latacha Texeira who works as a Land, both in [place]. The patient also has a goddaughter, [name], whom she help raise and whom she considers as a daughter    ADVANCED DIRECTIVES: Not in place. At her to 05/29/2015 visit the patient was given the appropriate documents 2 complete and notarize at her discretion. She tells me she intends to name her daughter [name] as her healthcare power of attorney. 68 can be reached at [place]: History  Substance Use Topics  . Smoking status: Never Smoker   . Smokeless tobacco: Never Used  . Alcohol Use: No     Colonoscopy: [facility],  2011  PAP:  Bone density: Remote  Lipid panel:  Allergies  Allergen Reactions  . Biaxin [Clarithromycin] Other (See Comments)    UNKNOWN    Current Outpatient Prescriptions  Medication Sig Dispense Refill  . aspirin 81 MG tablet Take 81 mg by mouth daily.    . Calcium Carbonate (CALCIUM 600 PO) Take by mouth.    . cetirizine (ZYRTEC) 10 MG chewable tablet Chew 10 mg by mouth daily.    . Cholecalciferol (VITAMIN D) 1000 UNITS capsule Take 1,000 Units by mouth daily.    . cycloSPORINE (RESTASIS) 0.05 % ophthalmic emulsion Place 1 drop into both eyes 2 (two) times daily.    . ferrous sulfate 325 (65 FE) MG tablet Take 325 mg by mouth daily with breakfast.    . hydrochlorothiazide (HYDRODIURIL) 25 MG tablet Take 25 mg by mouth every other day.     . insulin lispro protamine-lispro (HUMALOG 75/25 MIX) (75-25) 100 UNIT/ML SUSP injection Inject 30 Units into the skin 2 (two) times daily with a meal. Humalog 70/30.    Marland Kitchen LORazepam (ATIVAN) 0.5 MG tablet Take 1 tablet (0.5 mg total) by mouth every 6 (six) hours as needed (Nausea or vomiting). 30 tablet  0  . losartan (COZAAR) 50 MG tablet Take 50 mg by mouth daily.    . metFORMIN (GLUCOPHAGE) 1000 MG tablet Take 1,000 mg by mouth 2 (two) times daily with a meal.    . Omega-3 Fatty Acids (FISH OIL PO) Take by mouth.    . pantoprazole (PROTONIX) 40 MG tablet Take 40 mg by mouth daily.    Marland Kitchen Propylene Glycol (SYSTANE BALANCE OP) Apply 1 drop to eye 4 (four) times daily. Pt uses this medication in both eyes four times daily.    Marland Kitchen lidocaine-prilocaine (EMLA) cream Apply to affected area once (Patient not taking: Reported on 12/31/2014) 30 g 3  . ondansetron (ZOFRAN) 8 MG tablet Take 1 tablet (8 mg total) by mouth 2 (two) times daily. Start the day after chemo for 3 days. Then take as needed for nausea or vomiting. (Patient not taking: Reported on 01/14/2015) 30 tablet 1  . oxyCODONE-acetaminophen (PERCOCET) 10-325 MG per tablet Take 1 tablet by mouth every  6 (six) hours as needed for pain. (Patient not taking: Reported on 12/10/2014) 20 tablet 0  . prochlorperazine (COMPAZINE) 10 MG tablet Take 1 tablet (10 mg total) by mouth every 6 (six) hours as needed (Nausea or vomiting). (Patient not taking: Reported on 01/14/2015) 30 tablet 1  . tobramycin-dexamethasone (TOBRADEX) ophthalmic solution Place 1 drop into both eyes every 4 (four) hours while awake. (Patient not taking: Reported on 12/24/2014) 5 mL 0   No current facility-administered medications for this visit.   Facility-Administered Medications Ordered in Other Visits  Medication Dose Route Frequency Provider Last Rate Last Dose  . cyclophosphamide (CYTOXAN) 1,220 mg in sodium chloride 0.9 % 250 mL chemo infusion  600 mg/m2 (Treatment Plan Actual) Intravenous Once Chauncey Cruel, MD 622 mL/hr at 01/14/15 1411 1,220 mg at 01/14/15 1411  . heparin lock flush 100 unit/mL  500 Units Intracatheter Once PRN Chauncey Cruel, MD      . sodium chloride 0.9 % injection 10 mL  10 mL Intracatheter PRN Chauncey Cruel, MD   10 mL at 01/14/15 1150    OBJECTIVE: Middle-aged African-American woman in no acute distress Filed Vitals:   01/14/15 1203  BP: 137/78  Pulse: 89  Temp: 97.8 F (36.6 C)  Resp: 20     Body mass index is 36.91 kg/(m^2).    ECOG FS:0 - Asymptomatic  Skin: warm, dry  HEENT: sclerae anicteric, conjunctivae pink, oropharynx clear. No thrush or mucositis.  Lymph Nodes: No cervical or supraclavicular lymphadenopathy  Lungs: clear to auscultation bilaterally, no rales, wheezes, or rhonci  Heart: regular rate and rhythm  Abdomen: round, soft, non tender, positive bowel sounds  Musculoskeletal: No focal spinal tenderness, no peripheral edema  Neuro: non focal, well oriented, positive affect  Breasts: deferred  LAB RESULTS:  CMP     Component Value Date/Time   NA 140 01/14/2015 1056   NA 142 10/16/2014 0848   K 4.2 01/14/2015 1056   K 3.9 10/16/2014 0848   CL 108 10/16/2014  0848   CO2 27 01/14/2015 1056   CO2 29 10/16/2014 0848   GLUCOSE 144* 01/14/2015 1056   GLUCOSE 155* 10/16/2014 0848   BUN 15.3 01/14/2015 1056   BUN 11 10/16/2014 0848   CREATININE 0.7 01/14/2015 1056   CREATININE 0.73 10/16/2014 0848   CALCIUM 9.5 01/14/2015 1056   CALCIUM 9.2 10/16/2014 0848   PROT 6.6 01/14/2015 1056   PROT 7.1 03/08/2014 0500   ALBUMIN 3.6 01/14/2015 1056   ALBUMIN  3.1* 03/08/2014 0500   AST 17 01/14/2015 1056   AST 12 03/08/2014 0500   ALT 9 01/14/2015 1056   ALT 8 03/08/2014 0500   ALKPHOS 97 01/14/2015 1056   ALKPHOS 90 03/08/2014 0500   BILITOT 0.55 01/14/2015 1056   BILITOT 0.6 03/08/2014 0500   GFRNONAA >90 10/16/2014 0848   GFRAA >90 10/16/2014 0848    INo results found for: SPEP, UPEP  Lab Results  Component Value Date   WBC 5.6 01/14/2015   NEUTROABS 3.6 01/14/2015   HGB 9.5* 01/14/2015   HCT 29.3* 01/14/2015   MCV 75.5* 01/14/2015   PLT 269 01/14/2015      Chemistry      Component Value Date/Time   NA 140 01/14/2015 1056   NA 142 10/16/2014 0848   K 4.2 01/14/2015 1056   K 3.9 10/16/2014 0848   CL 108 10/16/2014 0848   CO2 27 01/14/2015 1056   CO2 29 10/16/2014 0848   BUN 15.3 01/14/2015 1056   BUN 11 10/16/2014 0848   CREATININE 0.7 01/14/2015 1056   CREATININE 0.73 10/16/2014 0848      Component Value Date/Time   CALCIUM 9.5 01/14/2015 1056   CALCIUM 9.2 10/16/2014 0848   ALKPHOS 97 01/14/2015 1056   ALKPHOS 90 03/08/2014 0500   AST 17 01/14/2015 1056   AST 12 03/08/2014 0500   ALT 9 01/14/2015 1056   ALT 8 03/08/2014 0500   BILITOT 0.55 01/14/2015 1056   BILITOT 0.6 03/08/2014 0500       No results found for: LABCA2  No components found for: [id number]  No results for input(s): INR in the last 168 hours.  Urinalysis    Component Value Date/Time   COLORURINE YELLOW 01/14/2007 0708   APPEARANCEUR CLEAR 01/14/2007 0708   LABSPEC 1.017 01/14/2007 0708   PHURINE 6.0 01/14/2007 0708   GLUCOSEU NEGATIVE  01/14/2007 0708   HGBUR NEGATIVE 01/14/2007 0708   BILIRUBINUR NEGATIVE 01/14/2007 0708   KETONESUR NEGATIVE 01/14/2007 0708   PROTEINUR NEGATIVE 01/14/2007 0708   UROBILINOGEN 0.2 01/14/2007 0708   NITRITE NEGATIVE 01/14/2007 0708   LEUKOCYTESUR SMALL* 01/14/2007 0708    STUDIES: No results found.  ASSESSMENT: 59 y.o. [place] woman status post left breast upper outer quadrant biopsy 09/09/2014 for a clinical T1b N0, stage IA invasive ductal carcinoma, grade 2 or 3, weakly estrogen receptor positive, progesterone receptor and HER-2 negative, with an MIB-1 of 35%  (1) status post left lumpectomy and sentinel lymph node sampling 10/01/2014 for a pT1c pN0, stage IA invasive ductal carcinoma, grade 3, repeat HER-2 again negative.  (a) positive posterior margin cleared with subsequent excision 10/21/2014  (2) Mammaprint shows a basal type tumor which is clearly high risk. It confirms that the tumor is the triple negative   (3) adjuvant chemotherapy starting 11/11/2014 consisting of cyclophosphamide and docetaxel 4, with Neulasta support, completed 01/14/2015  (4) adjuvant radiation to follow chemotherapy  (5) the patient has declined genetics testing   PLAN:  Dominique Dillon looks and feels well today. The labs were reviewed in detail and were stable. She is demonstrating some treatment related anemia, but besides fatigues she is asymptomatic. She will proceed with cycle 4 of cyclophosphamide and docetaxel as planned today.   She has met with Dr. Isidore Moos to discuss radiation, and will be simulated in 2 weeks.   Dominique Dillon will return in 1 week for labs and a final nadir visit. She understands and agrees with this plan. She knows the goal of treatment in  her case is cure. She has been encouraged to call with any issues that might arise before her next visit here.    Laurie Panda, NP   01/14/2015 2:23 PM

## 2015-01-14 NOTE — Progress Notes (Unsigned)
Met with pt during final chemotherapy treatment. Relate she is doing well and without needs. Encourage pt to call with questions or concerns. 

## 2015-01-16 ENCOUNTER — Ambulatory Visit (HOSPITAL_BASED_OUTPATIENT_CLINIC_OR_DEPARTMENT_OTHER): Payer: BC Managed Care – PPO

## 2015-01-16 VITALS — BP 100/74 | HR 102 | Temp 98.6°F

## 2015-01-16 DIAGNOSIS — Z5189 Encounter for other specified aftercare: Secondary | ICD-10-CM | POA: Diagnosis not present

## 2015-01-16 DIAGNOSIS — C50412 Malignant neoplasm of upper-outer quadrant of left female breast: Secondary | ICD-10-CM

## 2015-01-16 MED ORDER — PEGFILGRASTIM INJECTION 6 MG/0.6ML ~~LOC~~
6.0000 mg | PREFILLED_SYRINGE | Freq: Once | SUBCUTANEOUS | Status: AC
  Administered 2015-01-16: 6 mg via SUBCUTANEOUS
  Filled 2015-01-16: qty 0.6

## 2015-01-22 ENCOUNTER — Other Ambulatory Visit: Payer: Self-pay | Admitting: *Deleted

## 2015-01-22 ENCOUNTER — Encounter: Payer: Self-pay | Admitting: Nurse Practitioner

## 2015-01-22 ENCOUNTER — Ambulatory Visit (HOSPITAL_BASED_OUTPATIENT_CLINIC_OR_DEPARTMENT_OTHER): Payer: BC Managed Care – PPO | Admitting: Nurse Practitioner

## 2015-01-22 ENCOUNTER — Telehealth: Payer: Self-pay | Admitting: Oncology

## 2015-01-22 ENCOUNTER — Other Ambulatory Visit (HOSPITAL_BASED_OUTPATIENT_CLINIC_OR_DEPARTMENT_OTHER): Payer: BC Managed Care – PPO

## 2015-01-22 VITALS — BP 121/68 | HR 99 | Temp 98.2°F | Resp 18 | Ht 63.0 in | Wt 207.0 lb

## 2015-01-22 DIAGNOSIS — D6481 Anemia due to antineoplastic chemotherapy: Secondary | ICD-10-CM | POA: Diagnosis not present

## 2015-01-22 DIAGNOSIS — C50412 Malignant neoplasm of upper-outer quadrant of left female breast: Secondary | ICD-10-CM

## 2015-01-22 DIAGNOSIS — Z171 Estrogen receptor negative status [ER-]: Secondary | ICD-10-CM

## 2015-01-22 LAB — CBC WITH DIFFERENTIAL/PLATELET
BASO%: 1.6 % (ref 0.0–2.0)
Basophils Absolute: 0.3 10*3/uL — ABNORMAL HIGH (ref 0.0–0.1)
EOS%: 0.9 % (ref 0.0–7.0)
Eosinophils Absolute: 0.1 10*3/uL (ref 0.0–0.5)
HEMATOCRIT: 28.8 % — AB (ref 34.8–46.6)
HGB: 9.2 g/dL — ABNORMAL LOW (ref 11.6–15.9)
LYMPH%: 10.8 % — ABNORMAL LOW (ref 14.0–49.7)
MCH: 24 pg — AB (ref 25.1–34.0)
MCHC: 31.9 g/dL (ref 31.5–36.0)
MCV: 75.2 fL — AB (ref 79.5–101.0)
MONO#: 3.2 10*3/uL — AB (ref 0.1–0.9)
MONO%: 20.6 % — ABNORMAL HIGH (ref 0.0–14.0)
NEUT#: 10.2 10*3/uL — ABNORMAL HIGH (ref 1.5–6.5)
NEUT%: 66.1 % (ref 38.4–76.8)
PLATELETS: 261 10*3/uL (ref 145–400)
RBC: 3.83 10*6/uL (ref 3.70–5.45)
RDW: 17.7 % — ABNORMAL HIGH (ref 11.2–14.5)
WBC: 15.4 10*3/uL — ABNORMAL HIGH (ref 3.9–10.3)
lymph#: 1.7 10*3/uL (ref 0.9–3.3)

## 2015-01-22 LAB — COMPREHENSIVE METABOLIC PANEL (CC13)
ALBUMIN: 3.4 g/dL — AB (ref 3.5–5.0)
ALK PHOS: 117 U/L (ref 40–150)
ALT: 10 U/L (ref 0–55)
AST: 23 U/L (ref 5–34)
Anion Gap: 12 mEq/L — ABNORMAL HIGH (ref 3–11)
BUN: 8.4 mg/dL (ref 7.0–26.0)
CALCIUM: 9.7 mg/dL (ref 8.4–10.4)
CHLORIDE: 110 meq/L — AB (ref 98–109)
CO2: 22 mEq/L (ref 22–29)
Creatinine: 0.8 mg/dL (ref 0.6–1.1)
EGFR: >90 mL/min/{1.73_m2} (ref >90)
Glucose: 106 mg/dl (ref 70–140)
Potassium: 4 mEq/L (ref 3.5–5.1)
Sodium: 143 mEq/L (ref 136–145)
TOTAL PROTEIN: 6.5 g/dL (ref 6.4–8.3)
Total Bilirubin: 0.52 mg/dL (ref 0.20–1.20)

## 2015-01-22 MED ORDER — CYCLOSPORINE 0.05 % OP EMUL: 1.0000 [drp] | Freq: Two times a day (BID) | OPHTHALMIC | Status: DC

## 2015-01-22 NOTE — Telephone Encounter (Signed)
Gave avs & calendar for July °

## 2015-01-22 NOTE — Progress Notes (Signed)
[facility]  [phone] [phone]     ID: Dominique Dillon DOB: Sep 09, 1955  MR#: [id number]  PVX#:[id number]  Patient Care Team: Flora Lipps, FNP as PCP - General (Internal Medicine) Deland Pretty, MD as Referring Physician (Internal Medicine) Newton Pigg, MD as Consulting Physician (Obstetrics and Gynecology) Rolm Bookbinder, MD as Consulting Physician (General Surgery) Chauncey Cruel, MD as Consulting Physician (Oncology) Eppie Gibson, MD as Consulting Physician (Radiation Oncology) Rockwell Germany, RN as Registered Nurse Mauro Kaufmann, RN as Registered Nurse Holley Bouche, NP as Nurse Practitioner (Nurse Practitioner) OTHER MD:  CHIEF COMPLAINT: Triple negative breast cancer  CURRENT TREATMENT: Adjuvant chemotherapy  BREAST CANCER HISTORY: From the original intake note:  Sameen had bilateral screening mammography at [facility] 08/15/2014 showing a possible mass in the left breast. Left diagnostic mammography and ultrason[facility]2016 showed the breast density to be category B. There was a persistent spiculated mass in the upper outer quadrant of the left breast, which by ultrasonography measured 0.74 cm. Ultrasound of the left axilla was negative. Biopsy of the left breast mass in question 09/09/2014 showed ([id number]) and invasive ductal carcinoma, grade 2 or 3, E-cadherin positive, estrogen receptor 11% positive with weak staining intensity, progesterone receptor negative, HER-2 negative, with an MIB-1 of 35%.  The patient's subsequent history is as detailed below  INTERVAL HISTORY: Dominique Dillon returns today for follow-up of her triple negative breast cancer.  Last week she completed her 4th and final planned cycles of cyclophosphamide and docetaxel, with Neulasta support on day 2  REVIEW OF SYSTEMS: Dominique Dillon is feeling today. She denies fevers, chills, nausea, vomiting, or changes in bowel habits. She  denies mouth sores, rashes, or neuropathy symptoms. She has mild fatigue, but sleeps well. Her vision is stable. She asked if I would transfer her restasis to our outpatient pharmacy so she can use her [place] on this this expensive drug. A detailed review of systems is otherwise stable.  PAST MEDICAL HISTORY: Past Medical History  Diagnosis Date  . GERD (gastroesophageal reflux disease)   . Obesity   . Breast cancer of upper-outer quadrant of left female breast 09/12/2014  . History of anemia     still takes iron supplement  . Arthritis     knees  . Hypertension     under control with med., has been on med. since 1990s  . Insulin dependent diabetes mellitus   . Seasonal asthma     no current med.  . DDD (degenerative disc disease), cervical     PAST SURGICAL HISTORY: Past Surgical History  Procedure Laterality Date  . Cesarean section      x 2  . Knee arthroscopy Bilateral   . Abdominal hysterectomy      complete  . Cholecystectomy    . Appendectomy    . Tonsillectomy    . Irrigation and debridement abscess N/A 03/08/2014    Procedure: IRRIGATION AND DEBRIDEMENT ABDOMINAL WALL ABSCESS;  Surgeon: Adin Hector, MD;  Location: [facility];  Service: General;  Laterality: N/A;  . Colonoscopy    . Upper gi endoscopy    . Radioactive seed guided mastectomy with axillary sentinel lymph node biopsy Left 10/01/2014    Procedure: RADIOACTIVE SEED GUIDED LEFT BREAST LUMPECTOMY WITH LEFT  AXILLARY SENTINEL LYMPH NODE BIOPSY;  Surgeon: Rolm Bookbinder, MD;  Location: [place];  Service: General;  Laterality: Left;  . Carpal tunnel release Right 08/06/2004  . Trigger finger release  Right 08/06/2004    middle finger  . Portacath placement Right 10/21/2014    Procedure: INSERTION PORT-A-CATH;  Surgeon: Rolm Bookbinder, MD;  Location: [place];  Service: General;  Laterality: Right;  . Re-excision of breast lumpectomy Left 10/21/2014    Procedure:  RE-EXCISION OF BREAST CANCER, POSTERIOR MARGINS;  Surgeon: Rolm Bookbinder, MD;  Location: [place];  Service: General;  Laterality: Left;    FAMILY HISTORY Family History  Problem Relation Age of Onset  . Prostate cancer Father   . Stomach cancer Maternal Aunt   . Pancreatic cancer Maternal Uncle   . Lung cancer Maternal Grandmother   . Breast cancer Maternal Aunt   . Pancreatic cancer Maternal Aunt   . Pancreatic cancer Maternal Uncle    there is significant stomach (one case) and pancreatic cancer (3 cases) on the maternal side. There is also a history of breast cancer on the maternal side(1 and diagnosed at age 4). The patient's father died the age of 63 with prostate cancer  GYNECOLOGIC HISTORY:  No LMP recorded. Patient has had a hysterectomy. Menarche age 3, first live birth age 78, the patient is G X P2. She underwent hysterectomy with salpingo-oophorectomy in 1995. She did not take hormone replacement.  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 She is widowed. At home she lives with her adopted daughter Dominique Dillon. The patient's 2 biologic daughter are Dominique Dillon who works as a Recruitment consultant and Dominique Dillon who works as a Land, both in [place]. The patient also has a goddaughter, Dominique Dillon, whom she help raise and whom she considers as a daughter    ADVANCED DIRECTIVES: Not in place. At her to 05/29/2015 visit the patient was given the appropriate documents 2 complete and notarize at her discretion. She tells me she intends to name her daughter Dominique Dillon as her healthcare power of attorney. 68 can be reached at [place]: History  Substance Use Topics  . Smoking status: Never Smoker   . Smokeless tobacco: Never Used  . Alcohol Use: No     Colonoscopy: [place], 2011  PAP:  Bone density: Remote  Lipid panel:  Allergies  Allergen Reactions  . Biaxin [Clarithromycin] Other (See  Comments)    UNKNOWN    Current Outpatient Prescriptions  Medication Sig Dispense Refill  . aspirin 81 MG tablet Take 81 mg by mouth daily.    . Calcium Carbonate (CALCIUM 600 PO) Take by mouth.    . cetirizine (ZYRTEC) 10 MG chewable tablet Chew 10 mg by mouth daily.    . Cholecalciferol (VITAMIN D) 1000 UNITS capsule Take 1,000 Units by mouth daily.    . cycloSPORINE (RESTASIS) 0.05 % ophthalmic emulsion Place 1 drop into both eyes 2 (two) times daily. 0.4 mL 2  . ferrous sulfate 325 (65 FE) MG tablet Take 325 mg by mouth daily with breakfast.    . hydrochlorothiazide (HYDRODIURIL) 25 MG tablet Take 25 mg by mouth every other day.     . insulin lispro protamine-lispro (HUMALOG 75/25 MIX) (75-25) 100 UNIT/ML SUSP injection Inject 30 Units into the skin 2 (two) times daily with a meal. Humalog 70/30.    Marland Kitchen lidocaine-prilocaine (EMLA) cream Apply to affected area once (Patient not taking: Reported on 12/31/2014) 30 g 3  . LORazepam (ATIVAN) 0.5 MG tablet Take 1 tablet (0.5 mg total) by mouth every 6 (six) hours as needed (Nausea or vomiting). Bacon  tablet 0  . losartan (COZAAR) 50 MG tablet Take 50 mg by mouth daily.    . metFORMIN (GLUCOPHAGE) 1000 MG tablet Take 1,000 mg by mouth 2 (two) times daily with a meal.    . Omega-3 Fatty Acids (FISH OIL PO) Take by mouth.    . ondansetron (ZOFRAN) 8 MG tablet Take 1 tablet (8 mg total) by mouth 2 (two) times daily. Start the day after chemo for 3 days. Then take as needed for nausea or vomiting. (Patient not taking: Reported on 01/14/2015) 30 tablet 1  . oxyCODONE-acetaminophen (PERCOCET) 10-325 MG per tablet Take 1 tablet by mouth every 6 (six) hours as needed for pain. (Patient not taking: Reported on 12/10/2014) 20 tablet 0  . pantoprazole (PROTONIX) 40 MG tablet Take 40 mg by mouth daily.    . prochlorperazine (COMPAZINE) 10 MG tablet Take 1 tablet (10 mg total) by mouth every 6 (six) hours as needed (Nausea or vomiting). (Patient not taking: Reported on  01/14/2015) 30 tablet 1  . Propylene Glycol (SYSTANE BALANCE OP) Apply 1 drop to eye 4 (four) times daily. Pt uses this medication in both eyes four times daily.    Marland Kitchen tobramycin-dexamethasone (TOBRADEX) ophthalmic solution Place 1 drop into both eyes every 4 (four) hours while awake. (Patient not taking: Reported on 12/24/2014) 5 mL 0   No current facility-administered medications for this visit.    OBJECTIVE: Middle-aged African-American woman in no acute distress Filed Vitals:   01/22/15 1129  BP: 121/68  Dillon: 99  Temp: 98.2 F (36.8 C)  Resp: 18     Body mass index is 36.68 kg/(m^2).    ECOG FS:0 - Asymptomatic  Sclerae unicteric, pupils round and equal Oropharynx clear and moist-- no thrush or other lesions No cervical or supraclavicular adenopathy Lungs no rales or rhonchi Heart regular rate and rhythm Abd soft, nontender, positive bowel sounds MSK no focal spinal tenderness, no upper extremity lymphedema Neuro: nonfocal, well oriented, appropriate affect Breasts: deferred  LAB RESULTS:  CMP     Component Value Date/Time   NA 143 01/22/2015 1048   NA 142 10/16/2014 0848   K 4.0 01/22/2015 1048   K 3.9 10/16/2014 0848   CL 108 10/16/2014 0848   CO2 22 01/22/2015 1048   CO2 29 10/16/2014 0848   GLUCOSE 106 01/22/2015 1048   GLUCOSE 155* 10/16/2014 0848   BUN 8.4 01/22/2015 1048   BUN 11 10/16/2014 0848   CREATININE 0.8 01/22/2015 1048   CREATININE 0.73 10/16/2014 0848   CALCIUM 9.7 01/22/2015 1048   CALCIUM 9.2 10/16/2014 0848   PROT 6.5 01/22/2015 1048   PROT 7.1 03/08/2014 0500   ALBUMIN 3.4* 01/22/2015 1048   ALBUMIN 3.1* 03/08/2014 0500   AST 23 01/22/2015 1048   AST 12 03/08/2014 0500   ALT 10 01/22/2015 1048   ALT 8 03/08/2014 0500   ALKPHOS 117 01/22/2015 1048   ALKPHOS 90 03/08/2014 0500   BILITOT 0.52 01/22/2015 1048   BILITOT 0.6 03/08/2014 0500   GFRNONAA >90 10/16/2014 0848   GFRAA >90 10/16/2014 0848    INo results found for: SPEP,  UPEP  Lab Results  Component Value Date   WBC 15.4* 01/22/2015   NEUTROABS 10.2* 01/22/2015   HGB 9.2* 01/22/2015   HCT 28.8* 01/22/2015   MCV 75.2* 01/22/2015   PLT 261 01/22/2015      Chemistry      Component Value Date/Time   NA 143 01/22/2015 1048   NA 142 10/16/2014 0848  K 4.0 01/22/2015 1048   K 3.9 10/16/2014 0848   CL 108 10/16/2014 0848   CO2 22 01/22/2015 1048   CO2 29 10/16/2014 0848   BUN 8.4 01/22/2015 1048   BUN 11 10/16/2014 0848   CREATININE 0.8 01/22/2015 1048   CREATININE 0.73 10/16/2014 0848      Component Value Date/Time   CALCIUM 9.7 01/22/2015 1048   CALCIUM 9.2 10/16/2014 0848   ALKPHOS 117 01/22/2015 1048   ALKPHOS 90 03/08/2014 0500   AST 23 01/22/2015 1048   AST 12 03/08/2014 0500   ALT 10 01/22/2015 1048   ALT 8 03/08/2014 0500   BILITOT 0.52 01/22/2015 1048   BILITOT 0.6 03/08/2014 0500       No results found for: LABCA2  No components found for: LABCA125  No results for input(s): INR in the last 168 hours.  Urinalysis    Component Value Date/Time   COLORURINE YELLOW 01/14/2007 0708   APPEARANCEUR CLEAR 01/14/2007 0708   LABSPEC 1.017 01/14/2007 0708   PHURINE 6.0 01/14/2007 0708   GLUCOSEU NEGATIVE 01/14/2007 0708   HGBUR NEGATIVE 01/14/2007 0708   BILIRUBINUR NEGATIVE 01/14/2007 0708   KETONESUR NEGATIVE 01/14/2007 0708   PROTEINUR NEGATIVE 01/14/2007 0708   UROBILINOGEN 0.2 01/14/2007 0708   NITRITE NEGATIVE 01/14/2007 0708   LEUKOCYTESUR SMALL* 01/14/2007 0708    STUDIES: No results found.  ASSESSMENT: 59 y.o. [place] woman status post left breast upper outer quadrant biopsy 09/09/2014 for a clinical T1b N0, stage IA invasive ductal carcinoma, grade 2 or 3, weakly estrogen receptor positive, progesterone receptor and HER-2 negative, with an MIB-1 of 35%  (1) status post left lumpectomy and sentinel lymph node sampling 10/01/2014 for a pT1c pN0, stage IA invasive ductal carcinoma, grade 3, repeat HER-2 again  negative.  (a) positive posterior margin cleared with subsequent excision 10/21/2014  (2) Mammaprint shows a basal type tumor which is clearly high risk. It confirms that the tumor is the triple negative   (3) adjuvant chemotherapy starting 11/11/2014 consisting of cyclophosphamide and docetaxel 4, with Neulasta support, completed 01/14/2015  (4) adjuvant radiation to follow chemotherapy  (5) the patient has declined genetics testing   PLAN:  Dominique Dillon is excited to have completed chemotherapy. The labs were reviewed, and besides mild treatment related anemia everything was stable. She has recently started an oral iron supplement.   She will be simulated in 1 week for radiation to start in early July.  Dominique Dillon will return in late July for labs and a follow up visit with Dr. Jana Hakim. She understands and agrees with this plan. She knows the goal of treatment in her case is cure. She has been encouraged to call with any issues that might arise before her next visit here.    Laurie Panda, NP   01/22/2015 2:17 PM

## 2015-01-23 ENCOUNTER — Telehealth: Payer: Self-pay | Admitting: *Deleted

## 2015-01-23 NOTE — Telephone Encounter (Signed)
Faxed request asking if pharmacy can dispense two boxes = sixty vials of Cyclosporine opthalmic.  Return number for [facility] [phone] or fax [phone].

## 2015-01-24 ENCOUNTER — Telehealth: Payer: Self-pay

## 2015-01-24 NOTE — Telephone Encounter (Signed)
Pt reports she fell yesterday and injured her knee and breast.  Confirmed with pt that surgery on breast was completed March 2016 and the fall did not cause open wounds.  Confirmed with patient fall was resulted in "bumps and bruises".  Chemo completed - counts should have recovered by now.  Advised pt she should see here PCP.  If PCP feels oncology needs to see her relative to her breast, to let us know and we will be happy to see her.  Pt voiced understanding.    2 call reports rcvd from [place] 6/16 and 6/17.  Sent to scan.

## 2015-01-29 NOTE — Addendum Note (Signed)
Encounter addended by: Jenene Slicker, RN on: 01/29/2015  9:19 AM<BR>     Documentation filed: Charges VN

## 2015-02-03 ENCOUNTER — Telehealth: Payer: Self-pay | Admitting: Nurse Practitioner

## 2015-02-03 ENCOUNTER — Other Ambulatory Visit: Payer: Self-pay | Admitting: Nurse Practitioner

## 2015-02-03 ENCOUNTER — Ambulatory Visit
Admission: RE | Admit: 2015-02-03 | Discharge: 2015-02-03 | Disposition: A | Discharge status: Home / Self Care | Payer: BC Managed Care – PPO | Source: Ambulatory Visit | Attending: Radiation Oncology | Admitting: Radiation Oncology

## 2015-02-03 ENCOUNTER — Ambulatory Visit (HOSPITAL_BASED_OUTPATIENT_CLINIC_OR_DEPARTMENT_OTHER): Payer: BC Managed Care – PPO | Admitting: Nurse Practitioner

## 2015-02-03 ENCOUNTER — Telehealth: Payer: Self-pay | Admitting: *Deleted

## 2015-02-03 VITALS — BP 127/67 | HR 87 | Temp 98.1°F | Resp 18 | Ht 63.0 in | Wt 209.2 lb

## 2015-02-03 DIAGNOSIS — C50412 Malignant neoplasm of upper-outer quadrant of left female breast: Secondary | ICD-10-CM | POA: Diagnosis not present

## 2015-02-03 DIAGNOSIS — G893 Neoplasm related pain (acute) (chronic): Secondary | ICD-10-CM | POA: Diagnosis not present

## 2015-02-03 DIAGNOSIS — J302 Other seasonal allergic rhinitis: Secondary | ICD-10-CM

## 2015-02-03 DIAGNOSIS — Z510 Encounter for antineoplastic radiation therapy: Secondary | ICD-10-CM | POA: Diagnosis not present

## 2015-02-03 NOTE — Telephone Encounter (Signed)
Per Dr. Isidore Moos called Dominique Dillon  To se after her Ct simulation today  To be sen for medication reaction in med/onc, patient can be seen at 1230 pm, will let Ct sim  Therapists Katie/Heather Rt know, patient to go to lobby register to see Cyndee Bacon,NP 11:34 AM

## 2015-02-03 NOTE — Progress Notes (Signed)
  Radiation Oncology         [phone] ________________________________  Name: Dominique Dillon MRN: [id number]  Date: 02/03/2015  DOB: 06-29-1956  SIMULATION AND TREATMENT PLANNING NOTE    outpatient  DIAGNOSIS:     ICD-9-CM ICD-10-CM   1. Breast cancer of upper-outer quadrant of left female breast 174.4 C50.412     NARRATIVE:  The patient was brought to the [facility].  Identity was confirmed.  All relevant records and images related to the planned course of therapy were reviewed.  The patient freely provided informed written consent to proceed with treatment after reviewing the details related to the planned course of therapy. The consent form was witnessed and verified by the simulation staff.    Then, the patient was set-up in a stable reproducible supine position for radiation therapy with her ipsilateral arm over her head, and her upper body secured in a custom-made Vac-lok device.  CT images were obtained.  Surface markings were placed.  The CT images were loaded into the planning software.    TREATMENT PLANNING NOTE: Treatment planning then occurred.  The radiation prescription was entered and confirmed.     A total of 3 medically necessary complex treatment devices were fabricated and supervised by me: 2 fields with MLCs for custom blocks to protect heart, and lungs;  and, a Vac-lok. I have requested : 3D Simulation  I have requested a DVH of the following structures: lungs, heart, lumpectomy cavity.    The patient will receive 50 Gy in 25 fractions to the left breast with 2 tangential fields.   This will be followed by a boost.  Optical Surface Tracking Plan:  Since intensity modulated radiotherapy (IMRT) and 3D conformal radiation treatment methods are predicated on accurate and precise positioning for treatment, intrafraction motion monitoring is medically necessary to ensure accurate and safe treatment delivery. The ability to quantify intrafraction motion  without excessive ionizing radiation dose can only be performed with optical surface tracking. Accordingly, surface imaging offers the opportunity to obtain 3D measurements of patient position throughout IMRT and 3D treatments without excessive radiation exposure. I am ordering optical surface tracking for this patient's upcoming course of radiotherapy.  ________________________________   Reference:  Ursula Alert, J, et al. Surface imaging-based analysis of intrafraction motion for breast radiotherapy patients.Journal of [place], n. 6, nov. 2014. ISSN [id number].  Available at: <http://www.jacmp.org/index.php/jacmp/article/view/4957>.    -----------------------------------  Eppie Gibson, MD

## 2015-02-03 NOTE — Telephone Encounter (Signed)
Pt in office now added to NP/CB schedule per 06/27 POF.... Cherylann Banas

## 2015-02-05 DIAGNOSIS — Z510 Encounter for antineoplastic radiation therapy: Secondary | ICD-10-CM | POA: Diagnosis not present

## 2015-02-06 ENCOUNTER — Telehealth: Payer: Self-pay

## 2015-02-06 NOTE — Telephone Encounter (Signed)
Lab results rcvd from [facility] 02/06/15.  Reviewed by Dr. Jana Hakim, Sent to scan.

## 2015-02-07 DIAGNOSIS — Z510 Encounter for antineoplastic radiation therapy: Secondary | ICD-10-CM | POA: Diagnosis not present

## 2015-02-08 ENCOUNTER — Encounter: Payer: Self-pay | Admitting: Nurse Practitioner

## 2015-02-08 DIAGNOSIS — G893 Neoplasm related pain (acute) (chronic): Secondary | ICD-10-CM | POA: Insufficient documentation

## 2015-02-08 DIAGNOSIS — J302 Other seasonal allergic rhinitis: Secondary | ICD-10-CM | POA: Insufficient documentation

## 2015-02-08 NOTE — Assessment & Plan Note (Signed)
Patient's is complaining of some very mild, nasal congestion with clear secretions only.  Patient states that she takes either Claritin or Zyrtec on a daily basis to treat her chronic seasonal allergies.  On exam-minimal nasal congestion noted.  Oropharynx clear with no exudate.  No facial tenderness with palpation.  Advised patient to continue with her over-the-counter allergy medication as directed.  Also, advised patient to call/return or go directly to the emergency department for worsening symptoms.

## 2015-02-08 NOTE — Progress Notes (Signed)
SYMPTOM MANAGEMENT CLINIC   HPI: Dominique Dillon 59 y.o. female diagnosed with breast cancer.  Patient is status post  Lumpectomy and cyclophosphamide/docetaxel chemotherapy. Plan is for the pt to initiate radiation treatments next week.    Pt c/o continued chronic bilat leg achiness since receiving her Neulasta injection after her last chemo. She states that she took Claritin as directed following her chemotherapy.  She's also complaining of mild allergy symptoms as well.  She denies any fever or chills.   HPI    ROS  Past Medical History  Diagnosis Date  . GERD (gastroesophageal reflux disease)   . Obesity   . Breast cancer of upper-outer quadrant of left female breast 09/12/2014  . History of anemia     still takes iron supplement  . Arthritis     knees  . Hypertension     under control with med., has been on med. since 1990s  . Insulin dependent diabetes mellitus   . Seasonal asthma     no current med.  . DDD (degenerative disc disease), cervical     Past Surgical History  Procedure Laterality Date  . Cesarean section      x 2  . Knee arthroscopy Bilateral   . Abdominal hysterectomy      complete  . Cholecystectomy    . Appendectomy    . Tonsillectomy    . Irrigation and debridement abscess N/A 03/08/2014    Procedure: IRRIGATION AND DEBRIDEMENT ABDOMINAL WALL ABSCESS;  Surgeon: Adin Hector, MD;  Location: [facility];  Service: General;  Laterality: N/A;  . Colonoscopy    . Upper gi endoscopy    . Radioactive seed guided mastectomy with axillary sentinel lymph node biopsy Left 10/01/2014    Procedure: RADIOACTIVE SEED GUIDED LEFT BREAST LUMPECTOMY WITH LEFT  AXILLARY SENTINEL LYMPH NODE BIOPSY;  Surgeon: Rolm Bookbinder, MD;  Location: [place];  Service: General;  Laterality: Left;  . Carpal tunnel release Right 08/06/2004  . Trigger finger release Right 08/06/2004    middle finger  . Portacath placement Right 10/21/2014    Procedure: INSERTION  PORT-A-CATH;  Surgeon: Rolm Bookbinder, MD;  Location: [place];  Service: General;  Laterality: Right;  . Re-excision of breast lumpectomy Left 10/21/2014    Procedure: RE-EXCISION OF BREAST CANCER, POSTERIOR MARGINS;  Surgeon: Rolm Bookbinder, MD;  Location: [place];  Service: General;  Laterality: Left;    has Abscess of abdominal wall s/p I&D 7/30- & 03/08/2014; Cellulitis; Diabetes mellitus type 2, controlled; Essential hypertension, benign; GERD (gastroesophageal reflux disease); Anemia in neoplastic disease; Breast cancer of upper-outer quadrant of left female breast; Glaucoma; Seasonal allergies; and Cancer associated pain on her problem list.    is allergic to biaxin.    Medication List       This list is accurate as of: 02/03/15 11:59 PM.  Always use your most recent med list.               aspirin 81 MG tablet  Take 81 mg by mouth daily.     CALCIUM 600 PO  Take by mouth.     cetirizine 10 MG chewable tablet  Commonly known as:  ZYRTEC  Chew 10 mg by mouth daily.     cycloSPORINE 0.05 % ophthalmic emulsion  Commonly known as:  RESTASIS  Place 1 drop into both eyes 2 (two) times daily.     ferrous sulfate 325 (65 FE) MG tablet  Take 325 mg  by mouth daily with breakfast.     FISH OIL PO  Take by mouth.     hydrochlorothiazide 25 MG tablet  Commonly known as:  HYDRODIURIL  Take 25 mg by mouth every other day.     insulin lispro protamine-lispro (75-25) 100 UNIT/ML Susp injection  Commonly known as:  HUMALOG 75/25 MIX  Inject 30 Units into the skin 2 (two) times daily with a meal. Humalog 70/30.     lidocaine-prilocaine cream  Commonly known as:  EMLA  Apply to affected area once     LORazepam 0.5 MG tablet  Commonly known as:  ATIVAN  Take 1 tablet (0.5 mg total) by mouth every 6 (six) hours as needed (Nausea or vomiting).     losartan 50 MG tablet  Commonly known as:  COZAAR  Take 50 mg by mouth daily.      metFORMIN 1000 MG tablet  Commonly known as:  GLUCOPHAGE  Take 1,000 mg by mouth 2 (two) times daily with a meal.     ondansetron 8 MG tablet  Commonly known as:  ZOFRAN  Take 1 tablet (8 mg total) by mouth 2 (two) times daily. Start the day after chemo for 3 days. Then take as needed for nausea or vomiting.     oxyCODONE-acetaminophen 10-325 MG per tablet  Commonly known as:  PERCOCET  Take 1 tablet by mouth every 6 (six) hours as needed for pain.     pantoprazole 40 MG tablet  Commonly known as:  PROTONIX  Take 40 mg by mouth daily.     prochlorperazine 10 MG tablet  Commonly known as:  COMPAZINE  Take 1 tablet (10 mg total) by mouth every 6 (six) hours as needed (Nausea or vomiting).     SYSTANE BALANCE OP  Apply 1 drop to eye 4 (four) times daily. Pt uses this medication in both eyes four times daily.     tobramycin-dexamethasone ophthalmic solution  Commonly known as:  TOBRADEX  Place 1 drop into both eyes every 4 (four) hours while awake.     Vitamin D 1000 UNITS capsule  Take 1,000 Units by mouth daily.         PHYSICAL EXAMINATION  Oncology Vitals 02/03/2015 01/22/2015 01/16/2015 01/14/2015 01/08/2015 12/31/2014 12/26/2014  Height 160 cm 160 cm - - - 160 cm -  Weight 94.892 kg 93.895 kg - 94.484 kg 95.255 kg 93.214 kg -  Weight (lbs) 209 lbs 3 oz 207 lbs - 208 lbs 5 oz 210 lbs 205 lbs 8 oz -  BMI (kg/m2) 37.06 kg/m2 36.67 kg/m2 - - - 36.4 kg/m2 -  Temp 98.1 98.2 98.6 97.8 99 97.9 99.8  Pulse 87 99 102 89 77 107 92  Resp 18 18 - _0 -  SpO2 100 - - 100 100 100 -  BSA (m2) 2.05 m2 2.04 m2 - - - 2.04 m2 -   BP Readings from Last 3 Encounters:  02/03/15 127/67  01/22/15 121/68  01/16/15 100/74    Physical Exam  Constitutional: She is oriented to person, place, and time and well-developed, well-nourished, and in no distress.  HENT:  Head: Normocephalic and atraumatic.  Mouth/Throat: Oropharynx is clear and moist.  Mild nasal congestion only.  Throat clear. No  facial tenderness.   Eyes: Conjunctivae and EOM are normal. Pupils are equal, round, and reactive to light. Right eye exhibits no discharge. Left eye exhibits no discharge. No scleral icterus.  Neck: Normal range of motion. Neck supple. No  JVD present. No tracheal deviation present. No thyromegaly present.  Cardiovascular: Normal rate, regular rhythm, normal heart sounds and intact distal pulses.   Pulmonary/Chest: Effort normal and breath sounds normal. No respiratory distress. She has no wheezes. She has no rales. She exhibits no tenderness.  Abdominal: Soft. Bowel sounds are normal. She exhibits no distension and no mass. There is no tenderness. There is no rebound and no guarding.  Musculoskeletal: Normal range of motion. She exhibits no edema or tenderness.  Lymphadenopathy:    She has no cervical adenopathy.  Neurological: She is alert and oriented to person, place, and time. Gait normal.  Skin: Skin is warm and dry. No rash noted. No erythema. No pallor.  Psychiatric: Affect normal.  Nursing note and vitals reviewed.   LABORATORY DATA:. No visits with results within 3 Day(s) from this visit. Latest known visit with results is:  Appointment on 01/22/2015  Component Date Value Ref Range Status  . WBC 01/22/2015 15.4* 3.9 - 10.3 10e3/uL Final  . NEUT# 01/22/2015 10.2* 1.5 - 6.5 10e3/uL Final  . HGB 01/22/2015 9.2* 11.6 - 15.9 g/dL Final  . HCT 01/22/2015 28.8* 34.8 - 46.6 % Final  . Platelets 01/22/2015 261  145 - 400 10e3/uL Final  . MCV 01/22/2015 75.2* 79.5 - 101.0 fL Final  . MCH 01/22/2015 24.0* 25.1 - 34.0 pg Final  . MCHC 01/22/2015 31.9  31.5 - 36.0 g/dL Final  . RBC 01/22/2015 3.83  3.70 - 5.45 10e6/uL Final  . RDW 01/22/2015 17.7* 11.2 - 14.5 % Final  . lymph# 01/22/2015 1.7  0.9 - 3.3 10e3/uL Final  . MONO# 01/22/2015 3.2* 0.1 - 0.9 10e3/uL Final  . Eosinophils Absolute 01/22/2015 0.1  0.0 - 0.5 10e3/uL Final  . Basophils Absolute 01/22/2015 0.3* 0.0 - 0.1 10e3/uL  Final  . NEUT% 01/22/2015 66.1  38.4 - 76.8 % Final  . LYMPH% 01/22/2015 10.8* 14.0 - 49.7 % Final  . MONO% 01/22/2015 20.6* 0.0 - 14.0 % Final  . EOS% 01/22/2015 0.9  0.0 - 7.0 % Final  . BASO% 01/22/2015 1.6  0.0 - 2.0 % Final  . Sodium 01/22/2015 143  136 - 145 mEq/L Final  . Potassium 01/22/2015 4.0  3.5 - 5.1 mEq/L Final  . Chloride 01/22/2015 110* 98 - 109 mEq/L Final  . CO2 01/22/2015 22  22 - 29 mEq/L Final  . Glucose 01/22/2015 106  70 - 140 mg/dl Final  . BUN 01/22/2015 8.4  7.0 - 26.0 mg/dL Final  . Creatinine 01/22/2015 0.8  0.6 - 1.1 mg/dL Final  . Total Bilirubin 01/22/2015 0.52  0.20 - 1.20 mg/dL Final  . Alkaline Phosphatase 01/22/2015 117  40 - 150 U/L Final  . AST 01/22/2015 23  5 - 34 U/L Final  . ALT 01/22/2015 10  0 - 55 U/L Final  . Total Protein 01/22/2015 6.5  6.4 - 8.3 g/dL Final  . Albumin 01/22/2015 3.4* 3.5 - 5.0 g/dL Final  . Calcium 01/22/2015 9.7  8.4 - 10.4 mg/dL Final  . Anion Gap 01/22/2015 12* 3 - 11 mEq/L Final  . EGFR 01/22/2015 >90  >90 ml/min/1.73 m2 Final   eGFR is calculated using the CKD-EPI Creatinine Equation (2009)     RADIOGRAPHIC STUDIES: No results found.  ASSESSMENT/PLAN:    Breast cancer of upper-outer quadrant of left female breast Patient completed cyclophosphamide/docetaxel chemotherapy with Neulasta growth factor support on 01/14/2015.  Patient underwent a CT simulation in preparation for radiation therapy earlier today.she is scheduled to  initiate radiation treatments on 02/12/2015.  Patient scheduled to return for a followup visit on 03/05/2015.    Cancer associated pain Patient continues to complain of some mild, bilateral leg pain since receiving her Neulasta injection for growth factor support.  Following her last cycle of chemotherapy.  Patient states she took the Claritin as directed.  She also has oxycodone at home to take on an as-needed basis.  On exam-patient with no obvious injury or trauma to her legs.  There  is no erythema, warmth, edema, or tenderness on exam.  Patient ambulating with difficulty whatsoever.  Advised patient that mild achiness to bilateral legs could very well be secondary to deconditioning.  Advised that she try increasing her level of activity possible.   Advised patient to followup with her primary care physician is symptoms persist or worsen.  Seasonal allergies Patient's is complaining of some very mild, nasal congestion with clear secretions only.  Patient states that she takes either Claritin or Zyrtec on a daily basis to treat her chronic seasonal allergies.  On exam-minimal nasal congestion noted.  Oropharynx clear with no exudate.  No facial tenderness with palpation.  Advised patient to continue with her over-the-counter allergy medication as directed.  Also, advised patient to call/return or go directly to the emergency department for worsening symptoms.      Patient stated understanding of all instructions; and was in agreement with this plan of care. The patient knows to call the clinic with any problems, questions or concerns.   Review/collaboration with Dr. Jana Hakim  regarding all aspects of patient's visit today.   Total time spent with patient was 25 minutes;  with greater than 75 percent of that time spent in face to face counseling regarding patient's symptoms,  and coordination of care and follow up.  Disclaimer: This note was dictated with voice recognition software. Similar sounding words can inadvertently be transcribed and may not be corrected upon review.   Drue Second, NP 02/08/2015

## 2015-02-08 NOTE — Assessment & Plan Note (Signed)
Patient completed cyclophosphamide/docetaxel chemotherapy with Neulasta growth factor support on 01/14/2015.  Patient underwent a CT simulation in preparation for radiation therapy earlier today.she is scheduled to initiate radiation treatments on 02/12/2015.  Patient scheduled to return for a followup visit on 03/05/2015.

## 2015-02-08 NOTE — Assessment & Plan Note (Signed)
Patient continues to complain of some mild, bilateral leg pain since receiving her Neulasta injection for growth factor support.  Following her last cycle of chemotherapy.  Patient states she took the Claritin as directed.  She also has oxycodone at home to take on an as-needed basis.  On exam-patient with no obvious injury or trauma to her legs.  There is no erythema, warmth, edema, or tenderness on exam.  Patient ambulating with difficulty whatsoever.  Advised patient that mild achiness to bilateral legs could very well be secondary to deconditioning.  Advised that she try increasing her level of activity possible.   Advised patient to followup with her primary care physician is symptoms persist or worsen.

## 2015-02-11 ENCOUNTER — Ambulatory Visit
Admission: RE | Admit: 2015-02-11 | Discharge: 2015-02-11 | Disposition: A | Discharge status: Home / Self Care | Payer: BC Managed Care – PPO | Source: Ambulatory Visit | Attending: Radiation Oncology | Admitting: Radiation Oncology

## 2015-02-11 DIAGNOSIS — Z510 Encounter for antineoplastic radiation therapy: Secondary | ICD-10-CM | POA: Diagnosis not present

## 2015-02-12 ENCOUNTER — Ambulatory Visit
Admission: RE | Admit: 2015-02-12 | Discharge: 2015-02-12 | Disposition: A | Discharge status: Home / Self Care | Payer: BC Managed Care – PPO | Source: Ambulatory Visit | Attending: Radiation Oncology | Admitting: Radiation Oncology

## 2015-02-12 DIAGNOSIS — Z510 Encounter for antineoplastic radiation therapy: Secondary | ICD-10-CM | POA: Diagnosis not present

## 2015-02-13 ENCOUNTER — Other Ambulatory Visit: Payer: Self-pay | Admitting: Internal Medicine

## 2015-02-13 ENCOUNTER — Ambulatory Visit
Admission: RE | Admit: 2015-02-13 | Discharge: 2015-02-13 | Disposition: A | Discharge status: Home / Self Care | Payer: BC Managed Care – PPO | Source: Ambulatory Visit | Attending: Radiation Oncology | Admitting: Radiation Oncology

## 2015-02-13 ENCOUNTER — Ambulatory Visit
Admission: RE | Admit: 2015-02-13 | Discharge: 2015-02-13 | Disposition: A | Discharge status: Home / Self Care | Payer: BC Managed Care – PPO | Source: Ambulatory Visit | Attending: Internal Medicine | Admitting: Internal Medicine

## 2015-02-13 DIAGNOSIS — R0602 Shortness of breath: Secondary | ICD-10-CM

## 2015-02-13 DIAGNOSIS — Z510 Encounter for antineoplastic radiation therapy: Secondary | ICD-10-CM | POA: Diagnosis not present

## 2015-02-13 MED ORDER — IOPAMIDOL (ISOVUE-370) INJECTION 76%
100.0000 mL | Freq: Once | INTRAVENOUS | Status: AC | PRN
  Administered 2015-02-13: 100 mL via INTRAVENOUS

## 2015-02-14 ENCOUNTER — Ambulatory Visit
Admission: RE | Admit: 2015-02-14 | Discharge: 2015-02-14 | Disposition: A | Discharge status: Home / Self Care | Payer: BC Managed Care – PPO | Source: Ambulatory Visit | Attending: Radiation Oncology | Admitting: Radiation Oncology

## 2015-02-14 DIAGNOSIS — Z510 Encounter for antineoplastic radiation therapy: Secondary | ICD-10-CM | POA: Diagnosis not present

## 2015-02-17 ENCOUNTER — Ambulatory Visit
Admission: RE | Admit: 2015-02-17 | Discharge: 2015-02-17 | Disposition: A | Discharge status: Home / Self Care | Payer: BC Managed Care – PPO | Source: Ambulatory Visit | Attending: Radiation Oncology | Admitting: Radiation Oncology

## 2015-02-17 ENCOUNTER — Encounter: Payer: Self-pay | Admitting: Radiation Oncology

## 2015-02-17 VITALS — BP 118/80 | HR 80 | Temp 99.0°F | Resp 20 | Ht 63.0 in | Wt 206.6 lb

## 2015-02-17 DIAGNOSIS — C50412 Malignant neoplasm of upper-outer quadrant of left female breast: Secondary | ICD-10-CM

## 2015-02-17 DIAGNOSIS — Z510 Encounter for antineoplastic radiation therapy: Secondary | ICD-10-CM | POA: Diagnosis not present

## 2015-02-17 MED ORDER — ALRA NON-METALLIC DEODORANT (RAD-ONC): 1.0000 "application " | Freq: Once | TOPICAL | Status: DC

## 2015-02-17 MED ORDER — RADIAPLEXRX EX GEL: Freq: Once | CUTANEOUS | Status: DC

## 2015-02-17 NOTE — Progress Notes (Signed)
Weekly rad txs left breast 4/30 completed, no skin changes, patient education done, radiation therapy book, alra deodorant,radiaplex gel given to patient,  Discussed ways to manage skin irritation and fatiue,pain, swelling, tenderness of breast, use radiaplex bid, alra prn,  No c/o pain at present, verbal understanding, teach back method used 2:17 PM Wt Readings from Last 3 Encounters:  02/17/15 206 lb 9.6 oz (93.713 kg)  02/03/15 209 lb 3.2 oz (94.892 kg)  01/22/15 207 lb (93.895 kg)  BP 118/80 mmHg  Pulse 80  Temp(Src) 99 F (37.2 C) (Oral)  Resp 20  Ht 5\' 3"  (1.6 m)  Wt 206 lb 9.6 oz (93.713 kg)  BMI 36.61 kg/m2

## 2015-02-17 NOTE — Progress Notes (Signed)
Gas card $20.00  Given to patient 2:21 PM

## 2015-02-17 NOTE — Progress Notes (Signed)
   Weekly Management Note:  Outpatient    ICD-9-CM ICD-10-CM   1. Breast cancer of upper-outer quadrant of left female breast 174.4 C50.412 hyaluronate sodium (RADIAPLEXRX) gel     non-metallic deodorant (ALRA) 1 application    Current Dose:  8 Gy  Projected Dose: 60 Gy   Narrative:  The patient presents for routine under treatment assessment.  CBCT/MVCT images/Port film x-rays were reviewed.  The chart was checked. No complaints  Physical Findings:  height is 5\' 3"  (1.6 m) and weight is 206 lb 9.6 oz (93.713 kg). Her oral temperature is 99 F (37.2 C). Her blood pressure is 118/80 and her pulse is 80. Her respiration is 20.   Wt Readings from Last 3 Encounters:  02/17/15 206 lb 9.6 oz (93.713 kg)  02/03/15 209 lb 3.2 oz (94.892 kg)  01/22/15 207 lb (93.895 kg)   NAD, no left breast skin changes  Impression:  The patient is tolerating radiotherapy.  Plan:  Continue radiotherapy as planned.    ________________________________   Eppie Gibson, M.D.

## 2015-02-18 ENCOUNTER — Ambulatory Visit
Admission: RE | Admit: 2015-02-18 | Discharge: 2015-02-18 | Disposition: A | Discharge status: Home / Self Care | Payer: BC Managed Care – PPO | Source: Ambulatory Visit | Attending: Radiation Oncology | Admitting: Radiation Oncology

## 2015-02-18 DIAGNOSIS — Z510 Encounter for antineoplastic radiation therapy: Secondary | ICD-10-CM | POA: Diagnosis not present

## 2015-02-19 ENCOUNTER — Ambulatory Visit
Admission: RE | Admit: 2015-02-19 | Discharge: 2015-02-19 | Disposition: A | Discharge status: Home / Self Care | Payer: BC Managed Care – PPO | Source: Ambulatory Visit | Attending: Radiation Oncology | Admitting: Radiation Oncology

## 2015-02-19 DIAGNOSIS — Z510 Encounter for antineoplastic radiation therapy: Secondary | ICD-10-CM | POA: Diagnosis not present

## 2015-02-20 ENCOUNTER — Ambulatory Visit
Admission: RE | Admit: 2015-02-20 | Discharge: 2015-02-20 | Disposition: A | Discharge status: Home / Self Care | Payer: BC Managed Care – PPO | Source: Ambulatory Visit | Attending: Radiation Oncology | Admitting: Radiation Oncology

## 2015-02-20 ENCOUNTER — Encounter: Payer: Self-pay | Admitting: *Deleted

## 2015-02-20 DIAGNOSIS — Z510 Encounter for antineoplastic radiation therapy: Secondary | ICD-10-CM | POA: Diagnosis not present

## 2015-02-20 NOTE — Progress Notes (Signed)
Spoke to pt concerning needs during xrt. Relate doing well and is without complaints. Encourage pt to call with questions or needs. Received verbal understanding.

## 2015-02-21 ENCOUNTER — Encounter: Payer: Self-pay | Admitting: *Deleted

## 2015-02-21 ENCOUNTER — Ambulatory Visit
Admission: RE | Admit: 2015-02-21 | Discharge: 2015-02-21 | Disposition: A | Discharge status: Home / Self Care | Payer: BC Managed Care – PPO | Source: Ambulatory Visit | Attending: Radiation Oncology | Admitting: Radiation Oncology

## 2015-02-21 DIAGNOSIS — Z510 Encounter for antineoplastic radiation therapy: Secondary | ICD-10-CM | POA: Diagnosis not present

## 2015-02-21 NOTE — Progress Notes (Signed)
[facility]  Clinical Social Work notified pt that Publishing copy in [place] needed [employer] for approval of funding. Pt plans to bring in bills today and CSW will submit accordingly.  Loren Racer, [place] Worker [place]  [place] Phone: [phone] Fax: [phone]

## 2015-02-24 ENCOUNTER — Ambulatory Visit
Admission: RE | Admit: 2015-02-24 | Discharge: 2015-02-24 | Disposition: A | Discharge status: Home / Self Care | Payer: BC Managed Care – PPO | Source: Ambulatory Visit | Attending: Radiation Oncology | Admitting: Radiation Oncology

## 2015-02-24 VITALS — BP 111/78 | HR 82 | Wt 206.0 lb

## 2015-02-24 DIAGNOSIS — Z510 Encounter for antineoplastic radiation therapy: Secondary | ICD-10-CM | POA: Diagnosis not present

## 2015-02-24 DIAGNOSIS — C50412 Malignant neoplasm of upper-outer quadrant of left female breast: Secondary | ICD-10-CM

## 2015-02-24 NOTE — Progress Notes (Addendum)
   Weekly Management Note:  Outpatient    ICD-9-CM ICD-10-CM   1. Breast cancer of upper-outer quadrant of left female breast 174.4 C50.412     Current Dose:  18 Gy  Projected Dose: 60 Gy   Narrative:  The patient presents for routine under treatment assessment.  CBCT/MVCT images/Port film x-rays were reviewed.  The chart was checked. No complaints  Physical Findings:  weight is 206 lb (93.441 kg). Her blood pressure is 111/78 and her pulse is 82.   Wt Readings from Last 3 Encounters:  02/24/15 206 lb (93.441 kg)  02/17/15 206 lb 9.6 oz (93.713 kg)  02/03/15 209 lb 3.2 oz (94.892 kg)   NAD, no obvious left breast skin changes  Skin intact  Impression:  The patient is tolerating radiotherapy.  Plan:  Continue radiotherapy as planned.    ________________________________   Eppie Gibson, M.D.

## 2015-02-24 NOTE — Progress Notes (Signed)
Weekly assessment of radiation to left [email or web address] 9 of 30 treatments.Mild tannin go skin, no peeling.Continue application of radiaplex twice daily.Denies pain or fatigue.Told to discuss removal of porta cath with medical oncologist next week .

## 2015-02-25 ENCOUNTER — Ambulatory Visit
Admission: RE | Admit: 2015-02-25 | Discharge: 2015-02-25 | Disposition: A | Discharge status: Home / Self Care | Payer: BC Managed Care – PPO | Source: Ambulatory Visit | Attending: Radiation Oncology | Admitting: Radiation Oncology

## 2015-02-25 DIAGNOSIS — Z510 Encounter for antineoplastic radiation therapy: Secondary | ICD-10-CM | POA: Diagnosis not present

## 2015-02-26 ENCOUNTER — Ambulatory Visit
Admission: RE | Admit: 2015-02-26 | Discharge: 2015-02-26 | Disposition: A | Discharge status: Home / Self Care | Payer: BC Managed Care – PPO | Source: Ambulatory Visit | Attending: Radiation Oncology | Admitting: Radiation Oncology

## 2015-02-26 DIAGNOSIS — Z510 Encounter for antineoplastic radiation therapy: Secondary | ICD-10-CM | POA: Diagnosis not present

## 2015-02-27 ENCOUNTER — Ambulatory Visit
Admission: RE | Admit: 2015-02-27 | Discharge: 2015-02-27 | Disposition: A | Discharge status: Home / Self Care | Payer: BC Managed Care – PPO | Source: Ambulatory Visit | Attending: Radiation Oncology | Admitting: Radiation Oncology

## 2015-02-27 DIAGNOSIS — Z510 Encounter for antineoplastic radiation therapy: Secondary | ICD-10-CM | POA: Diagnosis not present

## 2015-02-28 ENCOUNTER — Ambulatory Visit
Admission: RE | Admit: 2015-02-28 | Discharge: 2015-02-28 | Disposition: A | Discharge status: Home / Self Care | Payer: BC Managed Care – PPO | Source: Ambulatory Visit | Attending: Radiation Oncology | Admitting: Radiation Oncology

## 2015-02-28 DIAGNOSIS — Z510 Encounter for antineoplastic radiation therapy: Secondary | ICD-10-CM | POA: Diagnosis not present

## 2015-03-03 ENCOUNTER — Encounter: Payer: Self-pay | Admitting: Radiation Oncology

## 2015-03-03 ENCOUNTER — Ambulatory Visit
Admission: RE | Admit: 2015-03-03 | Discharge: 2015-03-03 | Disposition: A | Discharge status: Home / Self Care | Payer: BC Managed Care – PPO | Source: Ambulatory Visit | Attending: Radiation Oncology | Admitting: Radiation Oncology

## 2015-03-03 VITALS — BP 121/67 | HR 73 | Temp 98.1°F | Resp 12 | Wt 204.1 lb

## 2015-03-03 DIAGNOSIS — C50412 Malignant neoplasm of upper-outer quadrant of left female breast: Secondary | ICD-10-CM

## 2015-03-03 DIAGNOSIS — Z510 Encounter for antineoplastic radiation therapy: Secondary | ICD-10-CM | POA: Diagnosis not present

## 2015-03-03 NOTE — Progress Notes (Signed)
Weekly Management Note:  Site: left breast Current Dose:   2800  cGy Projected Dose:  5000  cGy  ollow-up the left breast boost  Narrative: The patient is seen today for routine under treatment assessment. CBCT/MVCT images/port films were reviewed. The chart was reviewed.    She is without complaints today.  She uses Radioplex gel.  Physical Examination:  Filed Vitals:   03/03/15 1135  BP: 121/67  Pulse: 73  Temp: 98.1 F (36.7 C)  Resp: 12  .  Weight: 204 lb 1.6 oz (92.579 kg).  There is  mild hyperpigmentation along the left breast with no areas of desquamation.  Impression: Tolerating radiation therapy well.  Plan: Continue radiation therapy as planned.

## 2015-03-03 NOTE — Progress Notes (Signed)
PAIN: She is currently in no pain.  SKIN: Pt left breast- warm dry and intact. for.  Pt denies edema.  Pt continues to apply Radiaplex as directed.  BP 121/67 mmHg  Pulse 73  Temp(Src) 98.1 F (36.7 C) (Oral)  Resp 12  Wt 204 lb 1.6 oz (92.579 kg)  SpO2 100% Wt Readings from Last 3 Encounters:  03/03/15 204 lb 1.6 oz (92.579 kg)  02/24/15 206 lb (93.441 kg)  02/17/15 206 lb 9.6 oz (93.713 kg)

## 2015-03-04 ENCOUNTER — Ambulatory Visit
Admission: RE | Admit: 2015-03-04 | Discharge: 2015-03-04 | Disposition: A | Discharge status: Home / Self Care | Payer: BC Managed Care – PPO | Source: Ambulatory Visit | Attending: Radiation Oncology | Admitting: Radiation Oncology

## 2015-03-04 DIAGNOSIS — Z510 Encounter for antineoplastic radiation therapy: Secondary | ICD-10-CM | POA: Diagnosis not present

## 2015-03-05 ENCOUNTER — Ambulatory Visit (HOSPITAL_BASED_OUTPATIENT_CLINIC_OR_DEPARTMENT_OTHER): Payer: BC Managed Care – PPO | Admitting: Oncology

## 2015-03-05 ENCOUNTER — Ambulatory Visit
Admission: RE | Admit: 2015-03-05 | Discharge: 2015-03-05 | Disposition: A | Discharge status: Home / Self Care | Payer: BC Managed Care – PPO | Source: Ambulatory Visit | Attending: Radiation Oncology | Admitting: Radiation Oncology

## 2015-03-05 ENCOUNTER — Ambulatory Visit (HOSPITAL_BASED_OUTPATIENT_CLINIC_OR_DEPARTMENT_OTHER): Payer: BC Managed Care – PPO

## 2015-03-05 ENCOUNTER — Telehealth: Payer: Self-pay | Admitting: Oncology

## 2015-03-05 VITALS — BP 132/69 | HR 86 | Temp 99.2°F | Resp 18 | Ht 63.0 in | Wt 204.9 lb

## 2015-03-05 DIAGNOSIS — Z170 Estrogen receptor positive status [ER+]: Secondary | ICD-10-CM | POA: Diagnosis not present

## 2015-03-05 DIAGNOSIS — E119 Type 2 diabetes mellitus without complications: Secondary | ICD-10-CM

## 2015-03-05 DIAGNOSIS — D561 Beta thalassemia: Secondary | ICD-10-CM

## 2015-03-05 DIAGNOSIS — Z510 Encounter for antineoplastic radiation therapy: Secondary | ICD-10-CM | POA: Diagnosis not present

## 2015-03-05 DIAGNOSIS — C50412 Malignant neoplasm of upper-outer quadrant of left female breast: Secondary | ICD-10-CM

## 2015-03-05 DIAGNOSIS — D569 Thalassemia, unspecified: Secondary | ICD-10-CM | POA: Diagnosis not present

## 2015-03-05 LAB — CBC & DIFF AND RETIC
BASO%: 0.5 % (ref 0.0–2.0)
Basophils Absolute: 0 10*3/uL (ref 0.0–0.1)
EOS ABS: 0.3 10*3/uL (ref 0.0–0.5)
EOS%: 7.5 % — ABNORMAL HIGH (ref 0.0–7.0)
HCT: 33 % — ABNORMAL LOW (ref 34.8–46.6)
HEMOGLOBIN: 10.7 g/dL — AB (ref 11.6–15.9)
IMMATURE RETIC FRACT: 5.2 % (ref 1.60–10.00)
LYMPH#: 0.8 10*3/uL — AB (ref 0.9–3.3)
LYMPH%: 20.5 % (ref 14.0–49.7)
MCH: 24.7 pg — ABNORMAL LOW (ref 25.1–34.0)
MCHC: 32.4 g/dL (ref 31.5–36.0)
MCV: 76.2 fL — ABNORMAL LOW (ref 79.5–101.0)
MONO#: 0.3 10*3/uL (ref 0.1–0.9)
MONO%: 8.1 % (ref 0.0–14.0)
NEUT#: 2.4 10*3/uL (ref 1.5–6.5)
NEUT%: 63.4 % (ref 38.4–76.8)
PLATELETS: 220 10*3/uL (ref 145–400)
RBC: 4.33 10*6/uL (ref 3.70–5.45)
RDW: 15.8 % — ABNORMAL HIGH (ref 11.2–14.5)
RETIC CT ABS: 46.76 10*3/uL (ref 33.70–90.70)
Retic %: 1.08 % (ref 0.70–2.10)
WBC: 3.7 10*3/uL — AB (ref 3.9–10.3)

## 2015-03-05 LAB — IRON AND TIBC CHCC
%SAT: 15 % — ABNORMAL LOW (ref 21–57)
Iron: 40 ug/dL — ABNORMAL LOW (ref 41–142)
TIBC: 269 ug/dL (ref 236–444)
UIBC: 229 ug/dL (ref 120–384)

## 2015-03-05 LAB — FERRITIN CHCC: FERRITIN: 52 ng/mL (ref 9–269)

## 2015-03-05 NOTE — Progress Notes (Signed)
[facility]  [phone] [phone]     ID: Dominique Dillon DOB: 1956/06/24  MR#: [id number]  NOM#:[id number]  Patient Care Team: Flora Lipps, FNP as PCP - General (Internal Medicine) Deland Pretty, MD as Referring Physician (Internal Medicine) Newton Pigg, MD as Consulting Physician (Obstetrics and Gynecology) Rolm Bookbinder, MD as Consulting Physician (General Surgery) Chauncey Cruel, MD as Consulting Physician (Oncology) Eppie Gibson, MD as Consulting Physician (Radiation Oncology) Rockwell Germany, RN as Registered Nurse Mauro Kaufmann, RN as Registered Nurse Holley Bouche, NP as Nurse Practitioner (Nurse Practitioner) OTHER MD:  CHIEF COMPLAINT: Triple negative breast cancer  CURRENT TREATMENT: Adjuvant radiation  BREAST CANCER HISTORY: From the original intake note:  Aretha had bilateral screening mammography at [facility] 08/15/2014 showing a possible mass in the left breast. Left diagnostic mamm[facility] showed the breast density to be category B. There was a persistent spiculated mass in the upper outer quadrant of the left breast, which by ultrasonography measured 0.74 cm. Ultrasound of the left axilla was negative. Biopsy of the left breast mass in question 09/09/2014 showed ([id number]) and invasive ductal carcinoma, grade 2 or 3, E-cadherin positive, estrogen receptor 11% positive with weak staining intensity, progesterone receptor negative, HER-2 negative, with an MIB-1 of 35%.  The patient's subsequent history is as detailed below  INTERVAL HISTORY: [name] returns today for follow-up of her triple negative breast cancer. She is currently undergoing radiation, with approximately 3 more weeks to follow. She is tolerating the treatments well. She denies fatigue, significant skin changes, or other complications of the treatments  REVIEW OF SYSTEMS: Azaela tells me she  developed significant neuropathy from her chemotherapy, but that that got significantly better. She currently has no numbness or tingling in her feet or hands. She was also somewhat depressed, thinking about dying all the time. Her primary care physician started her on Cymbalta and that has helped a great deal. She [facility] the senior citizen [facility] at the [facility] and does some walking and that's her main activity at present. Otherwise a detailed review of systems today was stable  PAST MEDICAL HISTORY: Past Medical History  Diagnosis Date  . GERD (gastroesophageal reflux disease)   . Obesity   . Breast cancer of upper-outer quadrant of left female breast 09/12/2014  . History of anemia     still takes iron supplement  . Arthritis     knees  . Hypertension     under control with med., has been on med. since 1990s  . Insulin dependent diabetes mellitus   . Seasonal asthma     no current med.  . DDD (degenerative disc disease), cervical     PAST SURGICAL HISTORY: Past Surgical History  Procedure Laterality Date  . Cesarean section      x 2  . Knee arthroscopy Bilateral   . Abdominal hysterectomy      complete  . Cholecystectomy    . Appendectomy    . Tonsillectomy    . Irrigation and debridement abscess N/A 03/08/2014    Procedure: IRRIGATION AND DEBRIDEMENT ABDOMINAL WALL ABSCESS;  Surgeon: Adin Hector, MD;  Location: [facility];  Service: General;  Laterality: N/A;  . Colonoscopy    . Upper gi endoscopy    . Radioactive seed guided mastectomy with axillary sentinel lymph node biopsy Left 10/01/2014    Procedure: RADIOACTIVE SEED GUIDED LEFT BREAST LUMPECTOMY WITH LEFT  AXILLARY SENTINEL LYMPH NODE BIOPSY;  Surgeon: Rolm Bookbinder, MD;  Location: [place];  Service: General;  Laterality: Left;  . Carpal tunnel release Right 08/06/2004  . Trigger finger release Right 08/06/2004    middle finger  . Portacath placement Right 10/21/2014    Procedure: INSERTION  PORT-A-CATH;  Surgeon: Rolm Bookbinder, MD;  Location: [place];  Service: General;  Laterality: Right;  . Re-excision of breast lumpectomy Left 10/21/2014    Procedure: RE-EXCISION OF BREAST CANCER, POSTERIOR MARGINS;  Surgeon: Rolm Bookbinder, MD;  Location: [place];  Service: General;  Laterality: Left;    FAMILY HISTORY Family History  Problem Relation Age of Onset  . Prostate cancer Father   . Stomach cancer Maternal Aunt   . Pancreatic cancer Maternal Uncle   . Lung cancer Maternal Grandmother   . Breast cancer Maternal Aunt   . Pancreatic cancer Maternal Aunt   . Pancreatic cancer Maternal Uncle    there is significant stomach (one case) and pancreatic cancer (3 cases) on the maternal side. There is also a history of breast cancer on the maternal side(1 and diagnosed at age 69). The patient's father died the age of 52 with prostate cancer  GYNECOLOGIC HISTORY:  No LMP recorded. Patient has had a hysterectomy. Menarche age 74, first live birth age 4, the patient is G X P2. She underwent hysterectomy with salpingo-oophorectomy in 1995. She did not take hormone replacement.  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 She is widowed. At home she lives with her adopted daughter Dominique Dillon. The patient's 2 biologic daughter are Dominique Dillon who works as a Recruitment consultant and Dominique Dillon who works as a Land, both in [place]. The patient also has a goddaughter, Dominique Dillon, whom she help raise and whom she considers as a daughter    ADVANCED DIRECTIVES: Not in place. At her to 05/29/2015 visit the patient was given the appropriate documents 2 complete and notarize at her discretion. She tells me she intends to name her daughter Dominique Dillon as her healthcare power of attorney. 68 can be reached at [place]: History  Substance Use Topics  . Smoking status: Never Smoker   . Smokeless  tobacco: Never Used  . Alcohol Use: No     Colonoscopy: [facility], 2011  PAP:  Bone density: Remote  Lipid panel:  Allergies  Allergen Reactions  . Biaxin [Clarithromycin] Other (See Comments)    UNKNOWN    Current Outpatient Prescriptions  Medication Sig Dispense Refill  . aspirin 81 MG tablet Take 81 mg by mouth daily.    . Calcium Carbonate (CALCIUM 600 PO) Take by mouth.    . cetirizine (ZYRTEC) 10 MG chewable tablet Chew 10 mg by mouth daily.    . Cholecalciferol (VITAMIN D) 1000 UNITS capsule Take 1,000 Units by mouth daily.    . cycloSPORINE (RESTASIS) 0.05 % ophthalmic emulsion Place 1 drop into both eyes 2 (two) times daily. 0.4 mL   . DULoxetine (CYMBALTA) 60 MG capsule Take 60 mg by mouth 2 (two) times daily.    . ferrous sulfate 325 (65 FE) MG tablet Take 325 mg by mouth daily with breakfast.    . hyaluronate sodium (RADIAPLEXRX) GEL Apply 1 application topically 2 (two) times daily.    . hydrochlorothiazide (HYDRODIURIL) 25 MG tablet Take 25 mg by mouth every other day.     . insulin lispro protamine-lispro (HUMALOG 75/25 MIX) (75-25) 100 UNIT/ML SUSP  injection Inject 30 Units into the skin 2 (two) times daily with a meal. Humalog 70/30.    Marland Kitchen lidocaine-prilocaine (EMLA) cream Apply to affected area once 30 g 3  . LIVALO 4 MG TABS Take 4 mg by mouth daily.  0  . losartan (COZAAR) 50 MG tablet Take 50 mg by mouth daily.    . Magnesium 250 MG TABS Take 265 mg by mouth 2 (two) times daily.    . metFORMIN (GLUCOPHAGE) 1000 MG tablet Take 1,000 mg by mouth 2 (two) times daily with a meal.    . non-metallic deodorant (ALRA) MISC Apply 1 application topically daily as needed.    . Omega-3 Fatty Acids (FISH OIL PO) Take by mouth.    . pantoprazole (PROTONIX) 40 MG tablet Take 40 mg by mouth daily.     No current facility-administered medications for this visit.    OBJECTIVE: Middle-aged African-American woman who appears well Filed Vitals:   03/05/15 1031  BP: 132/69    Pulse: 86  Temp: 99.2 F (37.3 C)  Resp: 18     Body mass index is 36.31 kg/(m^2).    ECOG FS:0 - Asymptomatic  Sclerae unicteric, pupils round and equal Oropharynx clear and moist-- no thrush or other lesions No cervical or supraclavicular adenopathy Lungs no rales or rhonchi Heart regular rate and rhythm Abd soft, nontender, positive bowel sounds MSK no focal spinal tenderness, no upper extremity lymphedema Neuro: nonfocal, well oriented, appropriate affect Breasts: The right breast is unremarkable. The left breast is currently receiving radiation. There is minimal skin edema. There is no significant hyperpigmentation at this point. The left axilla is benign   LAB RESULTS:  CMP     Component Value Date/Time   NA 143 01/22/2015 1048   NA 142 10/16/2014 0848   K 4.0 01/22/2015 1048   K 3.9 10/16/2014 0848   CL 108 10/16/2014 0848   CO2 22 01/22/2015 1048   CO2 29 10/16/2014 0848   GLUCOSE 106 01/22/2015 1048   GLUCOSE 155* 10/16/2014 0848   BUN 8.4 01/22/2015 1048   BUN 11 10/16/2014 0848   CREATININE 0.8 01/22/2015 1048   CREATININE 0.73 10/16/2014 0848   CALCIUM 9.7 01/22/2015 1048   CALCIUM 9.2 10/16/2014 0848   PROT 6.5 01/22/2015 1048   PROT 7.1 03/08/2014 0500   ALBUMIN 3.4* 01/22/2015 1048   ALBUMIN 3.1* 03/08/2014 0500   AST 23 01/22/2015 1048   AST 12 03/08/2014 0500   ALT 10 01/22/2015 1048   ALT 8 03/08/2014 0500   ALKPHOS 117 01/22/2015 1048   ALKPHOS 90 03/08/2014 0500   BILITOT 0.52 01/22/2015 1048   BILITOT 0.6 03/08/2014 0500   GFRNONAA >90 10/16/2014 0848   GFRAA >90 10/16/2014 0848    INo results found for: SPEP, UPEP  Lab Results  Component Value Date   WBC 15.4* 01/22/2015   NEUTROABS 10.2* 01/22/2015   HGB 9.2* 01/22/2015   HCT 28.8* 01/22/2015   MCV 75.2* 01/22/2015   PLT 261 01/22/2015      Chemistry      Component Value Date/Time   NA 143 01/22/2015 1048   NA 142 10/16/2014 0848   K 4.0 01/22/2015 1048   K 3.9 10/16/2014  0848   CL 108 10/16/2014 0848   CO2 22 01/22/2015 1048   CO2 29 10/16/2014 0848   BUN 8.4 01/22/2015 1048   BUN 11 10/16/2014 0848   CREATININE 0.8 01/22/2015 1048   CREATININE 0.73 10/16/2014 0848  Component Value Date/Time   CALCIUM 9.7 01/22/2015 1048   CALCIUM 9.2 10/16/2014 0848   ALKPHOS 117 01/22/2015 1048   ALKPHOS 90 03/08/2014 0500   AST 23 01/22/2015 1048   AST 12 03/08/2014 0500   ALT 10 01/22/2015 1048   ALT 8 03/08/2014 0500   BILITOT 0.52 01/22/2015 1048   BILITOT 0.6 03/08/2014 0500       No results found for: LABCA2  No components found for: LABCA125  No results for input(s): INR in the last 168 hours.  Urinalysis    Component Value Date/Time   COLORURINE YELLOW 01/14/2007 0708   APPEARANCEUR CLEAR 01/14/2007 0708   LABSPEC 1.017 01/14/2007 0708   PHURINE 6.0 01/14/2007 0708   GLUCOSEU NEGATIVE 01/14/2007 0708   HGBUR NEGATIVE 01/14/2007 0708   BILIRUBINUR NEGATIVE 01/14/2007 0708   KETONESUR NEGATIVE 01/14/2007 0708   PROTEINUR NEGATIVE 01/14/2007 0708   UROBILINOGEN 0.2 01/14/2007 0708   NITRITE NEGATIVE 01/14/2007 0708   LEUKOCYTESUR SMALL* 01/14/2007 0708    STUDIES: Ct Angio Chest Pe W/cm &/or Wo Cm  02/13/2015   CLINICAL DATA:  Shortness of breath, cough, elevated D-dimer question pulmonary embolism, history breast cancer post lumpectomy and chemotherapy with ongoing radiation therapy, hypertension, diabetes mellitus  EXAM: CT ANGIOGRAPHY CHEST WITH CONTRAST  TECHNIQUE: Multidetector CT imaging of the chest was performed using the standard protocol during bolus administration of intravenous contrast. Multiplanar CT image reconstructions and MIPs were obtained to evaluate the vascular anatomy.  CONTRAST:  100 cc Isovue 370 IV  COMPARISON:  01/14/2007  FINDINGS: Surgical clips LEFT breast/axilla.  Aorta normal caliber with scattered atherosclerotic calcification.  No aortic aneurysm or dissection.  Pulmonary arteries well opacified and patent.   No evidence of pulmonary embolism.  Gallbladder surgically absent.  Suspected small splenule at gastrosplenic ligament, unchanged.  Visualized portion of upper abdomen otherwise unremarkable.  No thoracic adenopathy, with upper normal sized precarinal lymph node 10 mm short axis image 79 noted.  Minimal dependent atelectasis.  Lungs otherwise clear.  No infiltrate, pleural effusion, pneumothorax or mass/ nodule.  Sclerotic lesion LEFT lateral aspect of T5 vertebral body near base of pedicle unchanged since 2008 question bone island.  Review of the MIP images confirms the above findings.  IMPRESSION: No evidence of pulmonary embolism.  No significant intra thoracic abnormalities.   Electronically Signed   By: Lavonia Dana M.D.   On: 02/13/2015 15:05    ASSESSMENT: 59 y.o. [place] woman status post left breast upper outer quadrant biopsy 09/09/2014 for a clinical T1b N0, stage IA invasive ductal carcinoma, grade 2 or 3, weakly estrogen receptor positive, progesterone receptor and HER-2 negative, with an MIB-1 of 35%  (1) status post left lumpectomy and sentinel lymph node sampling 10/01/2014 for a pT1c pN0, stage IA invasive ductal carcinoma, grade 3, repeat HER-2 again negative.  (a) positive posterior margin cleared with subsequent excision 10/21/2014  (2) Mammaprint shows a basal type tumor which is clearly high risk. It confirms that the tumor is the triple negative   (3) adjuvant chemotherapy starting 11/11/2014 consisting of cyclophosphamide and docetaxel 4, with Neulasta support, completed 01/14/2015  (4) adjuvant radiation to be completed 03/25/2015  (5) the patient has declined genetics testing   (6) thalassemia, likely beta, with pending ferritin results drawn 03/05/2015  PLAN:  Juliann Pulse is tolerating the radiation well so far. She will completed mid August. After that she can have her port removed and I have already contacted her surgeon t to get that scheduled.  We reviewed her anemia  labs, which show a low MCV anemia which is very persistent. It doesn't seem to be responding to iron supplementation. Most likely she has a beta thalassemia and I am sending labs today simply to check iron by way of ferritin and TIBC. If the iron is normal as I expect that is all the workup I would do to establish the diagnosis. She understands this is a hereditary problem, which she has had all her life, and which she can pass on to her children. She can discuss it with her family in those terms.  Otherwise she will have her next set of mammograms in January and she will see me again in February. I will continue to "tag team her" with Dr. Donne Hazel until we complete her 5 years of follow-up here  She knows to call for any problems that may develop before her next visit.     Chauncey Cruel, MD   03/05/2015 11:13 AM

## 2015-03-06 ENCOUNTER — Ambulatory Visit
Admission: RE | Admit: 2015-03-06 | Discharge: 2015-03-06 | Disposition: A | Discharge status: Home / Self Care | Payer: BC Managed Care – PPO | Source: Ambulatory Visit | Attending: Radiation Oncology | Admitting: Radiation Oncology

## 2015-03-06 DIAGNOSIS — Z510 Encounter for antineoplastic radiation therapy: Secondary | ICD-10-CM | POA: Diagnosis not present

## 2015-03-07 ENCOUNTER — Ambulatory Visit
Admission: RE | Admit: 2015-03-07 | Discharge: 2015-03-07 | Disposition: A | Discharge status: Home / Self Care | Payer: BC Managed Care – PPO | Source: Ambulatory Visit | Attending: Radiation Oncology | Admitting: Radiation Oncology

## 2015-03-07 DIAGNOSIS — Z510 Encounter for antineoplastic radiation therapy: Secondary | ICD-10-CM | POA: Diagnosis not present

## 2015-03-10 ENCOUNTER — Encounter: Payer: Self-pay | Admitting: Radiation Oncology

## 2015-03-10 ENCOUNTER — Ambulatory Visit
Admission: RE | Admit: 2015-03-10 | Discharge: 2015-03-10 | Disposition: A | Discharge status: Home / Self Care | Payer: BC Managed Care – PPO | Source: Ambulatory Visit | Attending: Radiation Oncology | Admitting: Radiation Oncology

## 2015-03-10 DIAGNOSIS — Z510 Encounter for antineoplastic radiation therapy: Secondary | ICD-10-CM | POA: Diagnosis not present

## 2015-03-11 ENCOUNTER — Ambulatory Visit
Admission: RE | Admit: 2015-03-11 | Discharge: 2015-03-11 | Disposition: A | Discharge status: Home / Self Care | Payer: BC Managed Care – PPO | Source: Ambulatory Visit | Attending: Radiation Oncology | Admitting: Radiation Oncology

## 2015-03-11 ENCOUNTER — Encounter: Payer: Self-pay | Admitting: Radiation Oncology

## 2015-03-11 VITALS — BP 120/72 | HR 79 | Resp 16 | Wt 207.3 lb

## 2015-03-11 DIAGNOSIS — Z510 Encounter for antineoplastic radiation therapy: Secondary | ICD-10-CM | POA: Diagnosis not present

## 2015-03-11 DIAGNOSIS — C50412 Malignant neoplasm of upper-outer quadrant of left female breast: Secondary | ICD-10-CM | POA: Insufficient documentation

## 2015-03-11 MED ORDER — RADIAPLEXRX EX GEL
Freq: Once | CUTANEOUS | Status: AC
  Administered 2015-03-11: 12:00:00 via TOPICAL

## 2015-03-11 NOTE — Progress Notes (Signed)
Weight and vital stable. Denies pain. Reports left nipple is tender. Denies fatigue. Faint hyperpigmentation of left/treated breast noted.  BP 120/72 mmHg  Pulse 79  Resp 16  Wt 207 lb 4.8 oz (94.031 kg) Wt Readings from Last 3 Encounters:  03/11/15 207 lb 4.8 oz (94.031 kg)  03/05/15 204 lb 14.4 oz (92.942 kg)  03/03/15 204 lb 1.6 oz (92.579 kg)

## 2015-03-11 NOTE — Progress Notes (Signed)
Weekly Management Note:  Site: Left breast Current Dose:  4000  cGy Projected Dose: 5000  cGy followed by 5 fraction boost  Narrative: The patient is seen today for routine under treatment assessment. CBCT/MVCT images/port films were reviewed. The chart was reviewed.   She is without complaints today.  She uses Radioplex gel.  Physical Examination:  Filed Vitals:   03/11/15 1134  BP: 120/72  Pulse: 79  Resp: 16  .  Weight: 207 lb 4.8 oz (94.031 kg).  There is mild to moderate hyperpigmentation of the skin along the left breast with patchy dry desquamation along the inframammary region.  No areas of moist desquamation.  Impression: Tolerating radiation therapy well.  Plan: Continue radiation therapy as planned.

## 2015-03-12 ENCOUNTER — Other Ambulatory Visit: Payer: Self-pay | Admitting: Oncology

## 2015-03-12 ENCOUNTER — Ambulatory Visit
Admission: RE | Admit: 2015-03-12 | Payer: BC Managed Care – PPO | Source: Ambulatory Visit | Admitting: Radiation Oncology

## 2015-03-12 ENCOUNTER — Ambulatory Visit
Admission: RE | Admit: 2015-03-12 | Discharge: 2015-03-12 | Disposition: A | Discharge status: Home / Self Care | Payer: BC Managed Care – PPO | Source: Ambulatory Visit | Attending: Radiation Oncology | Admitting: Radiation Oncology

## 2015-03-12 DIAGNOSIS — Z510 Encounter for antineoplastic radiation therapy: Secondary | ICD-10-CM | POA: Diagnosis not present

## 2015-03-13 ENCOUNTER — Ambulatory Visit
Admission: RE | Admit: 2015-03-13 | Discharge: 2015-03-13 | Disposition: A | Discharge status: Home / Self Care | Payer: BC Managed Care – PPO | Source: Ambulatory Visit | Attending: Radiation Oncology | Admitting: Radiation Oncology

## 2015-03-13 DIAGNOSIS — Z510 Encounter for antineoplastic radiation therapy: Secondary | ICD-10-CM | POA: Diagnosis not present

## 2015-03-14 ENCOUNTER — Ambulatory Visit
Admission: RE | Admit: 2015-03-14 | Discharge: 2015-03-14 | Disposition: A | Discharge status: Home / Self Care | Payer: BC Managed Care – PPO | Source: Ambulatory Visit | Attending: Radiation Oncology | Admitting: Radiation Oncology

## 2015-03-14 DIAGNOSIS — Z510 Encounter for antineoplastic radiation therapy: Secondary | ICD-10-CM | POA: Diagnosis not present

## 2015-03-17 ENCOUNTER — Ambulatory Visit
Admission: RE | Admit: 2015-03-17 | Discharge: 2015-03-17 | Disposition: A | Discharge status: Home / Self Care | Payer: BC Managed Care – PPO | Source: Ambulatory Visit | Attending: Radiation Oncology | Admitting: Radiation Oncology

## 2015-03-17 ENCOUNTER — Encounter: Payer: Self-pay | Admitting: Radiation Oncology

## 2015-03-17 VITALS — BP 137/68 | HR 72 | Temp 98.4°F | Resp 12 | Wt 208.1 lb

## 2015-03-17 DIAGNOSIS — Z510 Encounter for antineoplastic radiation therapy: Secondary | ICD-10-CM | POA: Diagnosis not present

## 2015-03-17 DIAGNOSIS — C50412 Malignant neoplasm of upper-outer quadrant of left female breast: Secondary | ICD-10-CM

## 2015-03-17 NOTE — Progress Notes (Signed)
   Weekly Management Note:  Outpatient    ICD-9-CM ICD-10-CM   1. Breast cancer of upper-outer quadrant of left female breast 174.4 C50.412     Current Dose:  48 Gy  Projected Dose: 60 Gy   Narrative:  The patient presents for routine under treatment assessment.  CBCT/MVCT images/Port film x-rays were reviewed.  The chart was checked.  Skin irritation reported  Physical Findings:  weight is 208 lb 1.6 oz (94.394 kg). Her oral temperature is 98.4 F (36.9 C). Her blood pressure is 137/68 and her pulse is 72. Her respiration is 12 and oxygen saturation is 100%.   Wt Readings from Last 3 Encounters:  03/17/15 208 lb 1.6 oz (94.394 kg)  03/11/15 207 lb 4.8 oz (94.031 kg)  03/05/15 204 lb 14.4 oz (92.942 kg)   NAD, left breast hyperpigmented with early desquamation at IM fold  Impression:  The patient is tolerating radiotherapy.  Plan:  Continue radiotherapy as planned.  Triple antibiotic ointment given for IM fold  ________________________________   Eppie Gibson, M.D.

## 2015-03-17 NOTE — Progress Notes (Signed)
PAIN: She is currently in no pain.  SKIN: Pt left breast- positive for Hyperpigmentation and breast tenderness.  Pt denies edema.  Pt continues to apply Radiaplex as directed. Pt reports using Vaseline to treatment area.  Encouraged to discontinue doing so.  BP 137/68 mmHg  Pulse 72  Temp(Src) 98.4 F (36.9 C) (Oral)  Resp 12  Wt 208 lb 1.6 oz (94.394 kg)  SpO2 100% Wt Readings from Last 3 Encounters:  03/17/15 208 lb 1.6 oz (94.394 kg)  03/11/15 207 lb 4.8 oz (94.031 kg)  03/05/15 204 lb 14.4 oz (92.942 kg)

## 2015-03-18 ENCOUNTER — Ambulatory Visit
Admission: RE | Admit: 2015-03-18 | Payer: BC Managed Care – PPO | Source: Ambulatory Visit | Admitting: Radiation Oncology

## 2015-03-18 ENCOUNTER — Ambulatory Visit
Admission: RE | Admit: 2015-03-18 | Discharge: 2015-03-18 | Disposition: A | Discharge status: Home / Self Care | Payer: BC Managed Care – PPO | Source: Ambulatory Visit | Attending: Radiation Oncology | Admitting: Radiation Oncology

## 2015-03-18 DIAGNOSIS — Z510 Encounter for antineoplastic radiation therapy: Secondary | ICD-10-CM | POA: Diagnosis not present

## 2015-03-19 ENCOUNTER — Ambulatory Visit
Admission: RE | Admit: 2015-03-19 | Discharge: 2015-03-19 | Disposition: A | Discharge status: Home / Self Care | Payer: BC Managed Care – PPO | Source: Ambulatory Visit | Attending: Radiation Oncology | Admitting: Radiation Oncology

## 2015-03-19 DIAGNOSIS — Z510 Encounter for antineoplastic radiation therapy: Secondary | ICD-10-CM | POA: Diagnosis not present

## 2015-03-20 ENCOUNTER — Ambulatory Visit
Admission: RE | Admit: 2015-03-20 | Discharge: 2015-03-20 | Disposition: A | Discharge status: Home / Self Care | Payer: BC Managed Care – PPO | Source: Ambulatory Visit | Attending: Radiation Oncology | Admitting: Radiation Oncology

## 2015-03-20 DIAGNOSIS — Z510 Encounter for antineoplastic radiation therapy: Secondary | ICD-10-CM | POA: Diagnosis not present

## 2015-03-21 ENCOUNTER — Ambulatory Visit
Admission: RE | Admit: 2015-03-21 | Discharge: 2015-03-21 | Disposition: A | Discharge status: Home / Self Care | Payer: BC Managed Care – PPO | Source: Ambulatory Visit | Attending: Radiation Oncology | Admitting: Radiation Oncology

## 2015-03-21 DIAGNOSIS — Z510 Encounter for antineoplastic radiation therapy: Secondary | ICD-10-CM | POA: Diagnosis not present

## 2015-03-24 ENCOUNTER — Encounter: Payer: Self-pay | Admitting: Radiation Oncology

## 2015-03-24 ENCOUNTER — Ambulatory Visit
Admission: RE | Admit: 2015-03-24 | Discharge: 2015-03-24 | Disposition: A | Discharge status: Home / Self Care | Payer: BC Managed Care – PPO | Source: Ambulatory Visit | Attending: Radiation Oncology | Admitting: Radiation Oncology

## 2015-03-24 VITALS — BP 121/69 | HR 82 | Temp 98.2°F | Resp 12 | Wt 205.0 lb

## 2015-03-24 DIAGNOSIS — C50412 Malignant neoplasm of upper-outer quadrant of left female breast: Secondary | ICD-10-CM | POA: Diagnosis present

## 2015-03-24 DIAGNOSIS — Z510 Encounter for antineoplastic radiation therapy: Secondary | ICD-10-CM | POA: Diagnosis not present

## 2015-03-24 MED ORDER — RADIAPLEXRX EX GEL
Freq: Once | CUTANEOUS | Status: AC
  Administered 2015-03-24: 13:00:00 via TOPICAL

## 2015-03-24 NOTE — Progress Notes (Signed)
PAIN: She is currently in no pain. SKIN: Pt left breast- positive for Hyperpigmentation and dry desquamation.  Pt denies edema.  Pt continues to apply Radiaplex and Neosporin as directed. OTHER: Finishes treatment tomorrow!  BP 121/69 mmHg  Pulse 82  Temp(Src) 98.2 F (36.8 C) (Oral)  Resp 12  Wt 205 lb (92.987 kg)  SpO2 100% Wt Readings from Last 3 Encounters:  03/24/15 205 lb (92.987 kg)  03/17/15 208 lb 1.6 oz (94.394 kg)  03/11/15 207 lb 4.8 oz (94.031 kg)

## 2015-03-24 NOTE — Addendum Note (Signed)
Encounter addended by: Benn Moulder, RN on: 03/24/2015 12:34 PM<BR>     Documentation filed: Orders, Dx Association, Inpatient [facility]

## 2015-03-24 NOTE — Progress Notes (Signed)
   Weekly Management Note:  Outpatient    ICD-9-CM ICD-10-CM   1. Breast cancer of upper-outer quadrant of left female breast 174.4 C50.412     Current Dose:  58 Gy  Projected Dose: 60 Gy   Narrative:  The patient presents for routine under treatment assessment.  CBCT/MVCT images/Port film x-rays were reviewed.  The chart was checked.  No pain. Applying neosporin to IM fold.  Physical Findings:  weight is 205 lb (92.987 kg). Her oral temperature is 98.2 F (36.8 C). Her blood pressure is 121/69 and her pulse is 82. Her respiration is 12 and oxygen saturation is 100%.   Wt Readings from Last 3 Encounters:  03/24/15 205 lb (92.987 kg)  03/17/15 208 lb 1.6 oz (94.394 kg)  03/11/15 207 lb 4.8 oz (94.031 kg)   NAD, left breast hyperpigmented with dry desquamation at IM fold  Impression:  The patient is tolerating radiotherapy.  Plan:  Continue radiotherapy as planned.  Continue Radiaplex; Triple antibiotic ointment given for IM fold F/u in 57mo ________________________________   Eppie Gibson, M.D.

## 2015-03-25 ENCOUNTER — Ambulatory Visit
Admission: RE | Admit: 2015-03-25 | Discharge: 2015-03-25 | Disposition: A | Discharge status: Home / Self Care | Payer: BC Managed Care – PPO | Source: Ambulatory Visit | Attending: Radiation Oncology | Admitting: Radiation Oncology

## 2015-03-25 DIAGNOSIS — Z510 Encounter for antineoplastic radiation therapy: Secondary | ICD-10-CM | POA: Diagnosis not present

## 2015-03-31 ENCOUNTER — Encounter: Payer: Self-pay | Admitting: *Deleted

## 2015-04-03 ENCOUNTER — Telehealth: Payer: Self-pay | Admitting: Nurse Practitioner

## 2015-04-03 ENCOUNTER — Other Ambulatory Visit: Payer: Self-pay | Admitting: Adult Health

## 2015-04-03 DIAGNOSIS — C50412 Malignant neoplasm of upper-outer quadrant of left female breast: Secondary | ICD-10-CM

## 2015-04-03 NOTE — Telephone Encounter (Signed)
Per 08/25 POF added SCP with [facility] Spore, left msg for pt and mailed schedule to pt... KJ

## 2015-05-02 ENCOUNTER — Ambulatory Visit
Admission: RE | Admit: 2015-05-02 | Discharge: 2015-05-02 | Disposition: A | Discharge status: Home / Self Care | Payer: BC Managed Care – PPO | Source: Ambulatory Visit | Attending: Radiation Oncology | Admitting: Radiation Oncology

## 2015-05-02 ENCOUNTER — Telehealth: Payer: Self-pay | Admitting: Oncology

## 2015-05-02 VITALS — BP 119/79 | HR 70 | Temp 98.2°F | Wt 208.5 lb

## 2015-05-02 DIAGNOSIS — C50412 Malignant neoplasm of upper-outer quadrant of left female breast: Secondary | ICD-10-CM

## 2015-05-02 NOTE — Telephone Encounter (Signed)
Patient called and change appointment from 10/11 to 10/14. Confirmed change.

## 2015-05-02 NOTE — Progress Notes (Signed)
Radiation Oncology         (336) [phone] ________________________________  Name: Dominique Dillon MRN: [id number]  Date: 05/02/2015  DOB: Jun 05, 1956  Follow-Up Visit Note  Outpatient  CC: Holland Commons, FNP  Magrinat, Virgie Dad, MD  Diagnosis and Prior Radiotherapy:    ICD-9-CM ICD-10-CM   1. Breast cancer of upper-outer quadrant of left female breast 174.4 C50.412    Breast cancer of upper-outer quadrant of left female breast [1 month since radiation treatment]   Narrative:  The patient returns today for routine follow-up appointment with radiation oncology. She denies symptoms of pain. It is reported that her skin in the area of treatment is mildy discolored. She was concerned as to when the color of her left breast might return "back to normal". She has been administering the provided radiaplex cream to the treated area as previously instructed. Her PORT-CATH was removed on Monday (04/28/2015). She stated that she is going to [place] in October and [place] in November with her friends, since it has been on her bucket list. "I feel good and I'm going to that [facility] class now. I've been twice." The patient also stated that she is interested in the [employer] offered at the [facility] and attending the [facility] of [place], [place] next Saturday (05/10/2015). She expressed being excited about her birthday next week! The patient projected a health mental status and was not accompanied by family.                        ALLERGIES:  is allergic to biaxin.  Meds: Current Outpatient Prescriptions  Medication Sig Dispense Refill  . aspirin 81 MG tablet Take 81 mg by mouth daily.    . Calcium Carbonate (CALCIUM 600 PO) Take by mouth.    . cetirizine (ZYRTEC) 10 MG chewable tablet Chew 10 mg by mouth daily.    . Cholecalciferol (VITAMIN D) 1000 UNITS capsule Take 1,000 Units by mouth daily.    . cycloSPORINE (RESTASIS) 0.05 % ophthalmic emulsion Place 1 drop into both eyes 2 (two)  times daily. 0.4 mL   . DULoxetine (CYMBALTA) 60 MG capsule Take 60 mg by mouth 2 (two) times daily.    . ferrous sulfate 325 (65 FE) MG tablet Take 325 mg by mouth daily with breakfast.    . hyaluronate sodium (RADIAPLEXRX) GEL Apply 1 application topically 2 (two) times daily.    . hydrochlorothiazide (HYDRODIURIL) 25 MG tablet Take 25 mg by mouth every other day.     . insulin lispro protamine-lispro (HUMALOG 75/25 MIX) (75-25) 100 UNIT/ML SUSP injection Inject 30 Units into the skin 2 (two) times daily with a meal. Humalog 70/30.    Marland Kitchen LIVALO 4 MG TABS Take 4 mg by mouth daily.  0  . losartan (COZAAR) 50 MG tablet Take 50 mg by mouth daily.    . Magnesium 250 MG TABS Take 265 mg by mouth 2 (two) times daily.    . metFORMIN (GLUCOPHAGE) 1000 MG tablet Take 1,000 mg by mouth 2 (two) times daily with a meal.    . Omega-3 Fatty Acids (FISH OIL PO) Take by mouth.    . pantoprazole (PROTONIX) 40 MG tablet Take 40 mg by mouth daily.    Marland Kitchen tobramycin-dexamethasone (TOBRADEX) ophthalmic solution INSTILL 1 DROP INTO BOTH EYES EVERY 4 HOURS WHILE AWAKE  0   No current facility-administered medications for this encounter.    Physical Findings: The patient is in  no acute distress. Patient is alert and oriented. There is no significant changes to the status of the paients overall health to be noted at this time.  weight is 208 lb 8 oz (94.575 kg). Her temperature is 98.2 F (36.8 C). Her blood pressure is 119/79 and her pulse is 70.    Left Breast: residual hyperpigmentation, dryness over the left breast observed   Lab Findings: Lab Results  Component Value Date   WBC 3.7* 03/05/2015   HGB 10.7* 03/05/2015   HCT 33.0* 03/05/2015   MCV 76.2* 03/05/2015   PLT 220 03/05/2015    Radiographic Findings: No results found.  Impression/Plan: Dominique Dillon is a 59 year old female presenting to clinic in regards to her cancer of the left female breast. She is recovering well from the effects of  radiation therapy treatments.Healthy methods of management to address vocalized symptoms were reviewed. She is currently managing symptoms appropriately. The patient is aware that she can also use vitamin E lotion once her radiaplex is complete. She is educated on the proper administration and benefits of daily application of recommended lotion to the area of treatment. The patient is aware of the purpose and benefits of attending the [place] and [facility] [employer] Program offered at the [facility]. She is aware of her follow-up appointments with Dr. Jana Hakim, MD to take place in February of 2017. She will attend Survivorship in October of 2016. The patient understands that she can access her appointments and medical records via [facility]. She has been advised of her follow-up appointment with radiation oncology to take place as scheduled on an as needed basis. She understands that she is to follow-up with Dr. Jana Hakim, MD of medical oncology, moving forward. All vocalized questions and concerns have been addressed. If the patient develops any further questions or concerns in regards to her treatment and recovery, she has been encouraged to contact Dr. Isidore Moos, MD. (I will see her back PRN.)    This document serves as a record of services personally performed by Eppie Gibson, MD. It was created on her behalf by Lenn Cal, a trained medical scribe. The creation of this record is based on the scribe's personal observations and the provider's statements to them. This document has been checked and approved by the attending provider.  _____________________________________   Eppie Gibson, MD

## 2015-05-02 NOTE — Progress Notes (Signed)
Patient for one month follow up completion of radiation to left breast.Skin mildy discolored.Continue application of radiaplex but may try lotion with vitamin e.Denies pain.Follow up with Dr.Magrinat in February 2017 and Survivorship in October 2016. BP 119/79 mmHg  Pulse 70  Temp(Src) 98.2 F (36.8 C)  Wt 208 lb 8 oz (94.575 kg)

## 2015-05-12 ENCOUNTER — Encounter: Payer: Self-pay | Admitting: Radiation Oncology

## 2015-05-16 ENCOUNTER — Telehealth: Payer: Self-pay | Admitting: Oncology

## 2015-05-16 NOTE — Telephone Encounter (Signed)
Returned patient call re message left to r/s 10/11 appointment with HM to 10/10 or 10/14. Per patient called several time and not able to get rescheduled - no return call. Appointment is currently scheduled 10/14 and was r/s back on 9/23 from 10/11 to 10/14 and confirmed with patient. Left message confirming appointment for 10/14 @ 11 am.

## 2015-05-16 NOTE — Progress Notes (Signed)
  Radiation Oncology         (336) [phone] ________________________________  Name: Dominique Dillon MRN: [id number]  Date: 05/12/2015  DOB: Dec 19, 1955  End of Treatment Note  Diagnosis:  Stage I, pT1cN0 cM0 left breast invasive ductal carcinoma, with DCIS, ER 11%, PR and Her2 negative. Grade III.     Indication for treatment:  Curative       Radiation treatment dates:   02/12/15 to 03/25/15  Site/dose:    1) Left Breast, 50 Gy in 25 fractions 2) Left Breast Boost, 10 Gy in 5 fractions  Beams/energy:    1) 3D Conformal, 10X 2) Electron Monte Carlo, 15MeV  Narrative: The patient tolerated radiation treatment relatively well.    Plan: The patient has completed radiation treatment. The patient will return to radiation oncology clinic for routine followup in one month. I advised them to call or return sooner if they have any questions or concerns related to their recovery or treatment.  -----------------------------------  Eppie Gibson, MD  This document serves as a record of services personally performed by Eppie Gibson, MD. It was created on her behalf by Derek Mound, a trained medical scribe. The creation of this record is based on the scribe's personal observations and the provider's statements to them. This document has been checked and approved by the attending provider.

## 2015-05-16 NOTE — Progress Notes (Incomplete)
°  Radiation Oncology         [phone] ________________________________  Name: Dominique Dillon MRN: [id number]  Date: 03/14/2015  DOB: 1956-07-26  End of Treatment Note  Diagnosis: Stage I T1bN0M0 Left Breast UOQ GRADE III Invasive ductal carcinoma, ER11%, PR neg, HER2neg     Indication for treatment:  Curative       Radiation treatment dates:   1 ) 02/12/15- 03/18/15 2) 03/19/15- 03/25/15  Site/dose:    1) Left Breast, 50 Gy in 25 fractions 2) Left Breast Boost, 10 Gy in 5 fractions  Beams/energy:    1) 3D Conformal, 10X 2) Electron Monte Carlo, 15MeV  Narrative: The patient tolerated radiation treatment relatively well.   ***  Plan: The patient has completed radiation treatment. The patient will return to radiation oncology clinic for routine followup in one month. I advised them to call or return sooner if they have any questions or concerns related to their recovery or treatment.  -----------------------------------  Eppie Gibson, MD  This document serves as a record of services personally performed by Eppie Gibson, MD. It was created on her behalf by Derek Mound, a trained medical scribe. The creation of this record is based on the scribe's personal observations and the provider's statements to them. This document has been checked and approved by the attending provider.

## 2015-05-19 ENCOUNTER — Observation Stay (HOSPITAL_COMMUNITY)
Admission: EM | Admit: 2015-05-19 | Discharge: 2015-05-20 | Disposition: A | Discharge status: Home / Self Care | Payer: BC Managed Care – PPO | Attending: Family Medicine | Admitting: Family Medicine

## 2015-05-19 ENCOUNTER — Encounter (HOSPITAL_COMMUNITY): Payer: Self-pay | Admitting: Emergency Medicine

## 2015-05-19 ENCOUNTER — Emergency Department (HOSPITAL_COMMUNITY): Payer: BC Managed Care – PPO

## 2015-05-19 DIAGNOSIS — K219 Gastro-esophageal reflux disease without esophagitis: Secondary | ICD-10-CM | POA: Diagnosis present

## 2015-05-19 DIAGNOSIS — Z853 Personal history of malignant neoplasm of breast: Secondary | ICD-10-CM | POA: Diagnosis not present

## 2015-05-19 DIAGNOSIS — Z7982 Long term (current) use of aspirin: Secondary | ICD-10-CM | POA: Diagnosis not present

## 2015-05-19 DIAGNOSIS — E669 Obesity, unspecified: Secondary | ICD-10-CM | POA: Diagnosis present

## 2015-05-19 DIAGNOSIS — T50905A Adverse effect of unspecified drugs, medicaments and biological substances, initial encounter: Secondary | ICD-10-CM

## 2015-05-19 DIAGNOSIS — I959 Hypotension, unspecified: Secondary | ICD-10-CM | POA: Diagnosis not present

## 2015-05-19 DIAGNOSIS — D649 Anemia, unspecified: Secondary | ICD-10-CM | POA: Diagnosis not present

## 2015-05-19 DIAGNOSIS — Z7984 Long term (current) use of oral hypoglycemic drugs: Secondary | ICD-10-CM | POA: Insufficient documentation

## 2015-05-19 DIAGNOSIS — Z9012 Acquired absence of left breast and nipple: Secondary | ICD-10-CM | POA: Diagnosis not present

## 2015-05-19 DIAGNOSIS — Z6838 Body mass index (BMI) 38.0-38.9, adult: Secondary | ICD-10-CM | POA: Insufficient documentation

## 2015-05-19 DIAGNOSIS — I4581 Long QT syndrome: Secondary | ICD-10-CM | POA: Insufficient documentation

## 2015-05-19 DIAGNOSIS — E119 Type 2 diabetes mellitus without complications: Secondary | ICD-10-CM | POA: Diagnosis not present

## 2015-05-19 DIAGNOSIS — R55 Syncope and collapse: Secondary | ICD-10-CM

## 2015-05-19 DIAGNOSIS — R079 Chest pain, unspecified: Secondary | ICD-10-CM | POA: Diagnosis not present

## 2015-05-19 DIAGNOSIS — Z794 Long term (current) use of insulin: Secondary | ICD-10-CM | POA: Diagnosis not present

## 2015-05-19 DIAGNOSIS — I10 Essential (primary) hypertension: Secondary | ICD-10-CM | POA: Diagnosis not present

## 2015-05-19 DIAGNOSIS — Z79899 Other long term (current) drug therapy: Secondary | ICD-10-CM | POA: Diagnosis not present

## 2015-05-19 DIAGNOSIS — E876 Hypokalemia: Secondary | ICD-10-CM | POA: Diagnosis not present

## 2015-05-19 DIAGNOSIS — R9431 Abnormal electrocardiogram [ECG] [EKG]: Secondary | ICD-10-CM | POA: Diagnosis present

## 2015-05-19 MED ORDER — SODIUM CHLORIDE 0.9 % IV BOLUS (SEPSIS)
1000.0000 mL | Freq: Once | INTRAVENOUS | Status: AC
  Administered 2015-05-20: 1000 mL via INTRAVENOUS

## 2015-05-19 MED ORDER — GI COCKTAIL ~~LOC~~
30.0000 mL | Freq: Once | ORAL | Status: AC
  Administered 2015-05-20: 30 mL via ORAL
  Filled 2015-05-19: qty 30

## 2015-05-19 NOTE — ED Notes (Signed)
Per [facility] pt diagnosed with UTI and took her first dose of  Macrobid and started feeling dizzy and fait with chest pain.  Per FD they sat her up to take vitals and she passed out on them.  EMS sat up to take vital unable to palpate pulse. Denies chest pain and dizziness upon arriving to the facility. Slight chest pain now. 4-5 out of 10. Took 324 aspirin prior to EMS arrival. No nitro d/t low BPs.

## 2015-05-19 NOTE — ED Provider Notes (Signed)
CSN: [id number]     Arrival date & time 05/19/15  2314 History  By signing my name below, I, Meriel Pica, attest that this documentation has been prepared under the direction and in the presence of Merryl Hacker, MD. Electronically Signed: Meriel Pica, ED Scribe. 05/19/2015. 11:45 PM.   Chief Complaint  Patient presents with  . Chest Pain   The history is provided by the patient. No language interpreter was used.   HPI Comments: Dominique Dillon is a 59 y.o. female, with a PMhx of GERD, HTN, DM, and obesity, brought in by ambulance, who presents to the Emergency Department complaining of sudden onset, gradually improving, 5/10, mid sternum CP with associated SOB and chills that have resolved since onset. Pt was diagnosed with a UTI and was prescribed Macrobid today after PCP visit. She notes onset of her CP 30 minutes after taking her first Macrobid dose this evening. She also feels like something was stuck in her throat. Per fire department, patient passed out when they sat her up. She cannot confirm this. Denies rash or shortness of breath. Denies any fevers or other infectious symptoms. No PMhx of cardiomyopathies. Pt denies a PShx of smoking cigarettes and has no PFhx of death or MI before 59yo. Denies nausea, vomiting, or abdominal pain. Pt has been in remission from breasts CA for 4 months.   Past Medical History  Diagnosis Date  . GERD (gastroesophageal reflux disease)   . Obesity   . Breast cancer of upper-outer quadrant of left female breast ([place]) 09/12/2014  . History of anemia     still takes iron supplement  . Arthritis     knees  . Hypertension     under control with med., has been on med. since 1990s  . Insulin dependent diabetes mellitus ([place])   . Seasonal asthma     no current med.  . DDD (degenerative disc disease), cervical    Past Surgical History  Procedure Laterality Date  . Cesarean section      x 2  . Knee arthroscopy Bilateral   . Abdominal  hysterectomy      complete  . Cholecystectomy    . Appendectomy    . Tonsillectomy    . Irrigation and debridement abscess N/A 03/08/2014    Procedure: IRRIGATION AND DEBRIDEMENT ABDOMINAL WALL ABSCESS;  Surgeon: Adin Hector, MD;  Location: [facility];  Service: General;  Laterality: N/A;  . Colonoscopy    . Upper gi endoscopy    . Radioactive seed guided mastectomy with axillary sentinel lymph node biopsy Left 10/01/2014    Procedure: RADIOACTIVE SEED GUIDED LEFT BREAST LUMPECTOMY WITH LEFT  AXILLARY SENTINEL LYMPH NODE BIOPSY;  Surgeon: Rolm Bookbinder, MD;  Location: [facility];  Service: General;  Laterality: Left;  . Carpal tunnel release Right 08/06/2004  . Trigger finger release Right 08/06/2004    middle finger  . Portacath placement Right 10/21/2014    Procedure: INSERTION PORT-A-CATH;  Surgeon: Rolm Bookbinder, MD;  Location: [facility];  Service: General;  Laterality: Right;  . Re-excision of breast lumpectomy Left 10/21/2014    Procedure: RE-EXCISION OF BREAST CANCER, POSTERIOR MARGINS;  Surgeon: Rolm Bookbinder, MD;  Location: [facility];  Service: General;  Laterality: Left;   Family History  Problem Relation Age of Onset  . Prostate cancer Father   . Stomach cancer Maternal Aunt   . Pancreatic cancer Maternal Uncle   . Lung cancer Maternal Grandmother   . Breast  cancer Maternal Aunt   . Pancreatic cancer Maternal Aunt   . Pancreatic cancer Maternal Uncle    Social History  Substance Use Topics  . Smoking status: Never Smoker   . Smokeless tobacco: Never Used  . Alcohol Use: No   OB History    No data available     Review of Systems  Constitutional: Negative for fever.  Respiratory: Negative for chest tightness and shortness of breath.   Cardiovascular: Positive for chest pain.  Gastrointestinal: Positive for nausea. Negative for vomiting and abdominal pain.  Skin: Negative for rash.  Neurological: Negative for  headaches.  All other systems reviewed and are negative.  Allergies  Biaxin  Home Medications   Prior to Admission medications   Medication Sig Start Date End Date Taking? Authorizing Provider  aspirin 81 MG tablet Take 81 mg by mouth daily.   Yes Historical Provider, MD  Calcium Carbonate (CALCIUM 600 PO) Take 600 mg by mouth daily.    Yes Historical Provider, MD  cetirizine (ZYRTEC) 10 MG tablet Take 10 mg by mouth daily.   Yes Historical Provider, MD  Cholecalciferol (VITAMIN D) 1000 UNITS capsule Take 1,000 Units by mouth daily.   Yes Historical Provider, MD  DULoxetine (CYMBALTA) 30 MG capsule Take 30 mg by mouth 2 (two) times daily. 05/15/15  Yes Historical Provider, MD  ferrous sulfate 325 (65 FE) MG tablet Take 325 mg by mouth daily with breakfast.   Yes Historical Provider, MD  hydrochlorothiazide (HYDRODIURIL) 25 MG tablet Take 25 mg by mouth every other day.    Yes Historical Provider, MD  LIVALO 4 MG TABS Take 4 mg by mouth daily. 02/11/15  Yes Historical Provider, MD  losartan (COZAAR) 50 MG tablet Take 50 mg by mouth daily.   Yes Historical Provider, MD  Magnesium 250 MG TABS Take 250 mg by mouth 2 (two) times daily.    Yes Historical Provider, MD  metFORMIN (GLUCOPHAGE) 1000 MG tablet Take 1,000 mg by mouth 2 (two) times daily with a meal.   Yes Historical Provider, MD  NOVOLOG MIX 70/30 FLEXPEN (70-30) 100 UNIT/ML FlexPen Inject 30 Units into the skin 2 (two) times daily. 04/30/15  Yes Historical Provider, MD  Omega-3 Fatty Acids (FISH OIL PO) Take 1 tablet by mouth daily.    Yes Historical Provider, MD  pantoprazole (PROTONIX) 40 MG tablet Take 40 mg by mouth daily.   Yes Historical Provider, MD  cephALEXin (KEFLEX) 500 MG capsule Take 1 capsule (500 mg total) by mouth 2 (two) times daily. 05/20/15   Merryl Hacker, MD  predniSONE (DELTASONE) 20 MG tablet Take 2 tablets (40 mg total) by mouth daily with breakfast. 05/20/15   Merryl Hacker, MD   BP 88/50 mmHg  Pulse 102   Temp(Src) 97.6 F (36.4 C) (Oral)  Resp 18  SpO2 99% Physical Exam  Constitutional: She is oriented to person, place, and time. She appears well-developed and well-nourished. No distress.  HENT:  Head: Normocephalic and atraumatic.  Cardiovascular: Normal rate, regular rhythm and normal heart sounds.   Pulmonary/Chest: Effort normal and breath sounds normal. No respiratory distress. She has no wheezes.  Old port site left upper chest  Abdominal: Soft. Bowel sounds are normal. There is no tenderness. There is no rebound.  Neurological: She is alert and oriented to person, place, and time.  Skin: Skin is warm and dry.  Psychiatric: She has a normal mood and affect.  Nursing note and vitals reviewed.   ED Course  Procedures  DIAGNOSTIC STUDIES: Oxygen Saturation is 99% on RA, normal by my interpretation.    COORDINATION OF CARE: 11:38 PM Discussed treatment plan with pt at bedside and pt agreed to plan. Will order cardiac workup and GI cocktail.  1:49 AM Discussed with pt plan to order CT angio chest to evaluate for PE.   Labs Review Labs Reviewed  CBC WITH DIFFERENTIAL/PLATELET - Abnormal; Notable for the following:    Hemoglobin 11.4 (*)    HCT 35.5 (*)    MCV 74.1 (*)    MCH 23.8 (*)    All other components within normal limits  BASIC METABOLIC PANEL - Abnormal; Notable for the following:    Potassium 3.2 (*)    Chloride 100 (*)    Glucose, Bld 169 (*)    All other components within normal limits  D-DIMER, QUANTITATIVE (NOT AT [facility]) - Abnormal; Notable for the following:    D-Dimer, Quant 1.43 (*)    All other components within normal limits  TROPONIN I  TROPONIN I  PROCALCITONIN  URINALYSIS, ROUTINE W REFLEX MICROSCOPIC (NOT AT [facility])    Imaging Review Dg Chest 2 View  05/19/2015   CLINICAL DATA:  59 year old female with dizziness and chest pain. Initial encounter.  EXAM: CHEST  2 VIEW  COMPARISON:  Chest CTA 02/13/2015 and earlier.  FINDINGS: Stable mild  eventration of the right hemidiaphragm. Stable lung volumes. Normal cardiac size and mediastinal contours. Visualized tracheal air column is within normal limits. No pneumothorax, pulmonary edema, pleural effusion or confluent pulmonary opacity. Stable cholecystectomy clips. No acute osseous abnormality identified.  IMPRESSION: No acute cardiopulmonary abnormality.   Electronically Signed   By: Genevie Ann M.D.   On: 05/19/2015 23:57   Ct Angio Chest Pe W/cm &/or Wo Cm  05/20/2015   CLINICAL DATA:  59 year old female with dizziness, chest pain, syncope. Initial encounter.  EXAM: CT ANGIOGRAPHY CHEST WITH CONTRAST  TECHNIQUE: Multidetector CT imaging of the chest was performed using the standard protocol during bolus administration of intravenous contrast. Multiplanar CT image reconstructions and MIPs were obtained to evaluate the vascular anatomy.  CONTRAST:  100 mL Omnipaque 350.  COMPARISON:  Chest radiographs 05/19/2015 and earlier. Chest CTA 02/13/2015.  FINDINGS: Adequate contrast bolus timing in the pulmonary arterial tree. Mild respiratory motion artifact. Large body habitus. No focal filling defect identified in the pulmonary arterial tree to suggest the presence of acute pulmonary embolism.Patchy peripheral opacity in the left upper lobe (series 407, image 46) is new. Similar patchy opacity in the azygoesophageal recess and medial basal segments of the right lower lobe is chronic but increased. Major airways are patent. Stable mild peribronchovascular ground-glass opacity in the left lower lobe. Stable small subpleural lung nodules in the right middle lobe. No pleural effusion.  No pericardial effusion. Negative visualized aorta aside from atherosclerosis. No mediastinal or hilar lymphadenopathy. No axillary lymphadenopathy.  Stable visualized liver, spleen, pancreas, adrenal glands, kidneys, and bowel in the upper abdomen.  Degenerative changes in the thoracic spine. Stable visualized osseous structures.   Review of the MIP images confirms the above findings.  IMPRESSION: 1.  No evidence of acute pulmonary embolus. 2. New patchy peripheral opacity in the left upper lobe most suggestive of developing pneumonia. Acute on chronic right lower lobe opacity suspicious for developing infection in an area of chronic scarring. No pleural effusion. 3. Otherwise stable chest.   Electronically Signed   By: Genevie Ann M.D.   On: 05/20/2015 02:49   I have personally  reviewed and evaluated these images and lab results as part of my medical decision-making.   EKG Interpretation   Date/Time:  Monday May 19 2015 23:39:11 EDT Ventricular Rate:  104 PR Interval:  151 QRS Duration: 98 QT Interval:  384 QTC Calculation: 505 R Axis:   93 Text Interpretation:  Sinus tachycardia Right axis deviation Borderline  repolarization abnormality Prolonged QT interval Tachycardia when compared  to prior Confirmed by HORTON  MD, Loma Sousa ([id number]) on 05/19/2015 11:46:40  PM      MDM   Final diagnoses:  Chest pain  Medication adverse effect, initial encounter  Syncope, unspecified syncope type    This is a 59 year old female who presents with chest pain. This is in the setting of taking Macrobid. She states that she has taken this before. Reportedly had a syncopal event and is mildly hypotensive with a blood pressure of 88/50 initially. Patient given fluids. No rash or wheezing. No other signs of anaphylaxis. Chest pain workup initiated. Given syncope, d-dimer also sent. EKG shows prolonged QT interval which is new. This can occur with Macrobid. D-dimer positive. CTA obtained and is negative for PE. Does show possible developing pneumonia. Patient does not have a leukocytosis, is afebrile, and denies any cough. She does report congestion.  The patient's clinical picture is more looking like a medication reaction. She received 2 L of fluid, was given prednisone, Benadryl, and Pepcid. Initially blood pressure improved to the  952W systolic.; However on recheck, she continues to trend back downward. She is on her third liter of fluid. She is also tachycardic. Given prolonged QT and syncope, felt patient warrants admission for observation and further fluids. She clinically feels better and looks much better. At this time will not administer epinephrine unless blood pressures refractory to further fluids. Discussed with Dr. Hal Hope.  At request of Dr. Hal Hope, procalcitonin ordered.  I personally performed the services described in this documentation, which was scribed in my presence. The recorded information has been reviewed and is accurate.    Merryl Hacker, MD 05/20/15 [phone]

## 2015-05-20 ENCOUNTER — Encounter (HOSPITAL_COMMUNITY): Payer: Self-pay | Admitting: Radiology

## 2015-05-20 ENCOUNTER — Emergency Department (HOSPITAL_COMMUNITY): Payer: BC Managed Care – PPO

## 2015-05-20 ENCOUNTER — Encounter: Payer: BC Managed Care – PPO | Admitting: Nurse Practitioner

## 2015-05-20 DIAGNOSIS — E669 Obesity, unspecified: Secondary | ICD-10-CM | POA: Diagnosis present

## 2015-05-20 DIAGNOSIS — K210 Gastro-esophageal reflux disease with esophagitis: Secondary | ICD-10-CM | POA: Diagnosis not present

## 2015-05-20 DIAGNOSIS — R9431 Abnormal electrocardiogram [ECG] [EKG]: Secondary | ICD-10-CM | POA: Diagnosis present

## 2015-05-20 DIAGNOSIS — R079 Chest pain, unspecified: Secondary | ICD-10-CM | POA: Diagnosis not present

## 2015-05-20 DIAGNOSIS — R0902 Hypoxemia: Secondary | ICD-10-CM | POA: Insufficient documentation

## 2015-05-20 DIAGNOSIS — I959 Hypotension, unspecified: Secondary | ICD-10-CM | POA: Diagnosis present

## 2015-05-20 DIAGNOSIS — T50905A Adverse effect of unspecified drugs, medicaments and biological substances, initial encounter: Secondary | ICD-10-CM | POA: Insufficient documentation

## 2015-05-20 DIAGNOSIS — R55 Syncope and collapse: Secondary | ICD-10-CM | POA: Diagnosis not present

## 2015-05-20 DIAGNOSIS — I10 Essential (primary) hypertension: Secondary | ICD-10-CM | POA: Diagnosis not present

## 2015-05-20 DIAGNOSIS — I4581 Long QT syndrome: Secondary | ICD-10-CM

## 2015-05-20 DIAGNOSIS — T887XXA Unspecified adverse effect of drug or medicament, initial encounter: Secondary | ICD-10-CM

## 2015-05-20 DIAGNOSIS — K219 Gastro-esophageal reflux disease without esophagitis: Secondary | ICD-10-CM | POA: Diagnosis not present

## 2015-05-20 DIAGNOSIS — E876 Hypokalemia: Secondary | ICD-10-CM | POA: Diagnosis present

## 2015-05-20 LAB — CREATININE, SERUM
Creatinine, Ser: 0.96 mg/dL (ref 0.44–1.00)
GFR calc non Af Amer: >60 mL/min (ref >60)

## 2015-05-20 LAB — BASIC METABOLIC PANEL
ANION GAP: 13 (ref 5–15)
BUN: 15 mg/dL (ref 6–20)
CALCIUM: 9.3 mg/dL (ref 8.9–10.3)
CO2: 24 mmol/L (ref 22–32)
Chloride: 100 mmol/L — ABNORMAL LOW (ref 101–111)
Creatinine, Ser: 0.98 mg/dL (ref 0.44–1.00)
Glucose, Bld: 169 mg/dL — ABNORMAL HIGH (ref 65–99)
POTASSIUM: 3.2 mmol/L — AB (ref 3.5–5.1)
Sodium: 137 mmol/L (ref 135–145)

## 2015-05-20 LAB — URINALYSIS, ROUTINE W REFLEX MICROSCOPIC
BILIRUBIN URINE: NEGATIVE
Glucose, UA: NEGATIVE mg/dL
HGB URINE DIPSTICK: NEGATIVE
Ketones, ur: NEGATIVE mg/dL
Leukocytes, UA: NEGATIVE
Nitrite: NEGATIVE
PH: 5 (ref 5.0–8.0)
Protein, ur: NEGATIVE mg/dL
SPECIFIC GRAVITY, URINE: 1.034 — AB (ref 1.005–1.030)
Urobilinogen, UA: 0.2 mg/dL (ref 0.0–1.0)

## 2015-05-20 LAB — TROPONIN I
Troponin I: <0.03 ng/mL (ref <0.031)
Troponin I: <0.03 ng/mL (ref <0.031)

## 2015-05-20 LAB — CBC WITH DIFFERENTIAL/PLATELET
BASOS ABS: 0 10*3/uL (ref 0.0–0.1)
BASOS PCT: 0 %
Eosinophils Absolute: 0 10*3/uL (ref 0.0–0.7)
Eosinophils Relative: 0 %
HCT: 35.5 % — ABNORMAL LOW (ref 36.0–46.0)
Hemoglobin: 11.4 g/dL — ABNORMAL LOW (ref 12.0–15.0)
Lymphocytes Relative: 13 %
Lymphs Abs: 0.9 10*3/uL (ref 0.7–4.0)
MCH: 23.8 pg — ABNORMAL LOW (ref 26.0–34.0)
MCHC: 32.1 g/dL (ref 30.0–36.0)
MCV: 74.1 fL — ABNORMAL LOW (ref 78.0–100.0)
MONO ABS: 0.1 10*3/uL (ref 0.1–1.0)
Monocytes Relative: 2 %
NEUTROS ABS: 6.1 10*3/uL (ref 1.7–7.7)
NEUTROS PCT: 85 %
Platelets: 286 10*3/uL (ref 150–400)
RBC: 4.79 MIL/uL (ref 3.87–5.11)
RDW: 15.5 % (ref 11.5–15.5)
WBC: 7.2 10*3/uL (ref 4.0–10.5)

## 2015-05-20 LAB — GLUCOSE, CAPILLARY
GLUCOSE-CAPILLARY: 290 mg/dL — AB (ref 65–99)
Glucose-Capillary: 125 mg/dL — ABNORMAL HIGH (ref 65–99)

## 2015-05-20 LAB — CBC
HEMATOCRIT: 33.1 % — AB (ref 36.0–46.0)
Hemoglobin: 10.7 g/dL — ABNORMAL LOW (ref 12.0–15.0)
MCH: 23.6 pg — ABNORMAL LOW (ref 26.0–34.0)
MCHC: 32.3 g/dL (ref 30.0–36.0)
MCV: 73.1 fL — AB (ref 78.0–100.0)
Platelets: 251 10*3/uL (ref 150–400)
RBC: 4.53 MIL/uL (ref 3.87–5.11)
RDW: 15.4 % (ref 11.5–15.5)
WBC: 12.2 10*3/uL — ABNORMAL HIGH (ref 4.0–10.5)

## 2015-05-20 LAB — TSH: TSH: 0.583 u[IU]/mL (ref 0.350–4.500)

## 2015-05-20 LAB — MAGNESIUM: Magnesium: 1.2 mg/dL — ABNORMAL LOW (ref 1.7–2.4)

## 2015-05-20 LAB — PROCALCITONIN: PROCALCITONIN: 0.56 ng/mL

## 2015-05-20 LAB — MRSA PCR SCREENING: MRSA by PCR: NEGATIVE

## 2015-05-20 LAB — D-DIMER, QUANTITATIVE (NOT AT ARMC): D DIMER QUANT: 1.43 ug{FEU}/mL — AB (ref 0.00–0.48)

## 2015-05-20 MED ORDER — PRAVASTATIN SODIUM 40 MG PO TABS: 20.0000 mg | ORAL_TABLET | Freq: Every day | ORAL | Status: DC

## 2015-05-20 MED ORDER — MAGNESIUM OXIDE 400 (241.3 MG) MG PO TABS
200.0000 mg | ORAL_TABLET | Freq: Two times a day (BID) | ORAL | Status: DC
Start: 2015-05-20 — End: 2015-05-20
  Administered 2015-05-20: 200 mg via ORAL
  Filled 2015-05-20: qty 1

## 2015-05-20 MED ORDER — SODIUM CHLORIDE 0.9 % IV BOLUS (SEPSIS)
1000.0000 mL | Freq: Once | INTRAVENOUS | Status: AC
  Administered 2015-05-20: 1000 mL via INTRAVENOUS

## 2015-05-20 MED ORDER — ACETAMINOPHEN 325 MG PO TABS
650.0000 mg | ORAL_TABLET | Freq: Four times a day (QID) | ORAL | Status: DC | PRN
  Administered 2015-05-20: 650 mg via ORAL
  Filled 2015-05-20: qty 2

## 2015-05-20 MED ORDER — IOHEXOL 350 MG/ML SOLN: 100.0000 mL | Freq: Once | INTRAVENOUS | Status: DC | PRN

## 2015-05-20 MED ORDER — LOSARTAN POTASSIUM 50 MG PO TABS
50.0000 mg | ORAL_TABLET | Freq: Every day | ORAL | Status: DC
  Administered 2015-05-20: 50 mg via ORAL
  Filled 2015-05-20: qty 1

## 2015-05-20 MED ORDER — INSULIN ASPART 100 UNIT/ML ~~LOC~~ SOLN
0.0000 [IU] | Freq: Three times a day (TID) | SUBCUTANEOUS | Status: DC
  Administered 2015-05-20: 8 [IU] via SUBCUTANEOUS

## 2015-05-20 MED ORDER — CALCIUM CARBONATE 1250 (500 CA) MG PO TABS
1250.0000 mg | ORAL_TABLET | Freq: Every day | ORAL | Status: DC
  Administered 2015-05-20: 1250 mg via ORAL
  Filled 2015-05-20: qty 3

## 2015-05-20 MED ORDER — PREDNISONE 20 MG PO TABS
60.0000 mg | ORAL_TABLET | Freq: Once | ORAL | Status: AC
  Administered 2015-05-20: 60 mg via ORAL
  Filled 2015-05-20: qty 3

## 2015-05-20 MED ORDER — INSULIN ASPART 100 UNIT/ML ~~LOC~~ SOLN
0.0000 [IU] | Freq: Every day | SUBCUTANEOUS | Status: DC
Start: 2015-05-20 — End: 2015-05-20

## 2015-05-20 MED ORDER — HYDROCHLOROTHIAZIDE 25 MG PO TABS: 25.0000 mg | ORAL_TABLET | ORAL | Status: DC

## 2015-05-20 MED ORDER — FAMOTIDINE 20 MG PO TABS
20.0000 mg | ORAL_TABLET | Freq: Once | ORAL | Status: AC
  Administered 2015-05-20: 20 mg via ORAL
  Filled 2015-05-20: qty 1

## 2015-05-20 MED ORDER — ONDANSETRON HCL 4 MG/2ML IJ SOLN: 4.0000 mg | Freq: Four times a day (QID) | INTRAMUSCULAR | Status: DC | PRN

## 2015-05-20 MED ORDER — ONDANSETRON HCL 4 MG PO TABS: 4.0000 mg | ORAL_TABLET | Freq: Four times a day (QID) | ORAL | Status: DC | PRN

## 2015-05-20 MED ORDER — PANTOPRAZOLE SODIUM 40 MG PO TBEC
40.0000 mg | DELAYED_RELEASE_TABLET | Freq: Every day | ORAL | Status: DC
  Administered 2015-05-20: 40 mg via ORAL
  Filled 2015-05-20: qty 1

## 2015-05-20 MED ORDER — MORPHINE SULFATE (PF) 2 MG/ML IV SOLN: 1.0000 mg | INTRAVENOUS | Status: DC | PRN

## 2015-05-20 MED ORDER — PREDNISONE 20 MG PO TABS
40.0000 mg | ORAL_TABLET | Freq: Every day | ORAL | Status: DC
Start: 2015-05-20 — End: 2015-05-20

## 2015-05-20 MED ORDER — ASPIRIN EC 325 MG PO TBEC
325.0000 mg | DELAYED_RELEASE_TABLET | Freq: Every day | ORAL | Status: DC
  Administered 2015-05-20: 325 mg via ORAL
  Filled 2015-05-20: qty 1

## 2015-05-20 MED ORDER — MAGNESIUM OXIDE 400 (241.3 MG) MG PO TABS: 400.0000 mg | ORAL_TABLET | Freq: Once | ORAL | Status: DC

## 2015-05-20 MED ORDER — DIPHENHYDRAMINE HCL 25 MG PO CAPS
50.0000 mg | ORAL_CAPSULE | Freq: Once | ORAL | Status: AC
  Administered 2015-05-20: 50 mg via ORAL
  Filled 2015-05-20: qty 2

## 2015-05-20 MED ORDER — ACETAMINOPHEN 650 MG RE SUPP: 650.0000 mg | Freq: Four times a day (QID) | RECTAL | Status: DC | PRN

## 2015-05-20 MED ORDER — SODIUM CHLORIDE 0.9 % IV SOLN
INTRAVENOUS | Status: AC
  Administered 2015-05-20: 07:00:00 via INTRAVENOUS

## 2015-05-20 MED ORDER — DULOXETINE HCL 30 MG PO CPEP
30.0000 mg | ORAL_CAPSULE | Freq: Two times a day (BID) | ORAL | Status: DC
  Administered 2015-05-20: 30 mg via ORAL
  Filled 2015-05-20: qty 1

## 2015-05-20 MED ORDER — POTASSIUM CHLORIDE CRYS ER 20 MEQ PO TBCR
40.0000 meq | EXTENDED_RELEASE_TABLET | Freq: Once | ORAL | Status: AC
  Administered 2015-05-20: 40 meq via ORAL
  Filled 2015-05-20: qty 2

## 2015-05-20 MED ORDER — CEPHALEXIN 500 MG PO CAPS: 500.0000 mg | ORAL_CAPSULE | Freq: Two times a day (BID) | ORAL | Status: DC

## 2015-05-20 MED ORDER — ENOXAPARIN SODIUM 30 MG/0.3ML ~~LOC~~ SOLN
30.0000 mg | SUBCUTANEOUS | Status: DC
  Administered 2015-05-20: 30 mg via SUBCUTANEOUS
  Filled 2015-05-20: qty 0.3

## 2015-05-20 MED ORDER — ALUM & MAG HYDROXIDE-SIMETH 200-200-20 MG/5ML PO SUSP: 30.0000 mL | Freq: Four times a day (QID) | ORAL | Status: DC | PRN

## 2015-05-20 MED ORDER — SODIUM CHLORIDE 0.9 % IJ SOLN: 3.0000 mL | Freq: Two times a day (BID) | INTRAMUSCULAR | Status: DC

## 2015-05-20 MED ORDER — HYDROCODONE-ACETAMINOPHEN 5-325 MG PO TABS: 1.0000 | ORAL_TABLET | ORAL | Status: DC | PRN

## 2015-05-20 MED ORDER — DIPHENHYDRAMINE HCL 25 MG PO CAPS: 25.0000 mg | ORAL_CAPSULE | Freq: Three times a day (TID) | ORAL | Status: DC | PRN

## 2015-05-20 NOTE — Discharge Summary (Signed)
Physician Discharge Summary  Dominique Dillon ZOX:[id number] DOB: 1956-05-06 DOA: 05/19/2015  PCP: Holland Commons, FNP  Admit date: 05/19/2015 Discharge date: 05/20/2015  Time spent: 40 minutes  Recommendations for Outpatient Follow-up:  1. Has follow up appointment 05/23/15 with PCP. Recommend bmet to track potassium level, urinalysis to evaluate UTI and BP check. Follow A1c 2. Has appointment with cardiology 05/28/15 for follow up to CP admission. May benefit OP stress test.   Discharge Diagnoses:  Principal Problem:   Chest pain Active Problems:   Diabetes mellitus type 2, controlled ([place])   Essential hypertension, benign   GERD (gastroesophageal reflux disease)   Hypotension   Hypokalemia   Prolonged QT interval   Syncope   Chest pain at rest   Obesity   Discharge Condition: stable  Diet recommendation: carb modified/ heart healthy  Filed Weights   05/20/15 0630  Weight: 97.5 kg (214 lb 15.2 oz)    History of present illness:  Dominique Dillon is a very pleasant 59 y.o. female with past medical hx including breast cancer completed chemo 2 months ago, HTN, DM, GERD, obesity degenerative disc disease presented to the emergency department with a chief complaint of sudden onset sharp left anterior chest pain. Initial evaluation revealed slightly abnormal EKG with initial troponin negative.  Patient reported she was in her usual state of health when she had some lab work done in anticipation of her routine PCP visit. PCP office informed her she had a urinary tract infection and started Macrobid. Patient stated she took 1 dose 9 PM and 45 minutes later developed sudden onset left anterior chest pain. She described the pain as a pressure and aching and rated it a 10 out of 10. She stated the pain did not radiate. Associated symptoms included diaphoresis and mild shortness of breath and sensation that something was "caught in her throat".. She denied nausea vomiting. She called 911  and recalled EMS stating her blood pressure was low. Per the fire department patient passed out when they sat her up but she had no recollection of this. She denied fever chills abdominal pain diarrhea. He denied any cough or dyspnea on exertion lower extremity edema. She stated the pain was constant at 8 out of 10 after she took the aspirin and lasted until she got to the emergency department.  Workup in the emergency department revealed initial troponin negative, EKG with slightly prolonged QT, elevated d-dimer. CT of the chest no evidence of acute pulmonary embolus. New patchy peripheral opacity in the left upper lobe most suggestive of developing pneumonia. Acute on chronic right lower lobe opacity suspicious for developing infection in an area of chronic scarring. No pleural effusion. Otherwise stable chest, urinalysis negative.  In the emergency department she presented with a blood pressure of 88/50 received IV fluids and at the time of my exam hemodynamically stable, afebrile and not hypoxic. She is provided with GI cocktail, Pepcid, IV fluids, prednisone.  At the time of admission she was chest pain-free, with stable vital signs.   Hospital Course:  Chest pain at rest: Patient with a history of hypertension hyperlipidemia, GERD. Heart score 5. May be related to reaction to antibiotic in setting of chronic GERD. She admitted and she ruled out. She had no further episodes of chest pain. No events on tele.  She will follow up with Dr. Ellyn Hack he primary cardiologist. May consider OP stress test. She will continue statin and asa at discharge.  Active Problems: Hypotension/ Syncope: Reported per EMS. may  be related to reaction to antibiotic. She received IV fluids in the emergency department and remained hemodynamically stable. She was not orthostatic x2. She  Ambulated with steady gait, no sob, no chest pain.  with assistance will check orthostatics. Will hold HCTZ. Monitor blood pressure  closely.  Abnormal CT of the chest. Patient had an elevated d-dimer CT angios of the chest negative for PE but revealed new patchy peripheral pacer he concerning for pneumonia. She remained afebrile hemodynamically stable no leukocytosis. She has follow up appointment  with PCP in 2 days. Consider repeat x-ray  Hypokalemia: Mild. Will replete. Magnesium level 1.2. Given extra dose. Follow up with PCP    Diabetes mellitus type 2, controlled ([place]): Hemoglobin A1c pending at discharge. Reports A1c 5.8 01/2015. OP follow up.    Essential hypertension, benign: See above. continue her losartan at discharge. Will hold HCTZ for 2 days then instructed to resume.    GERD (gastroesophageal reflux disease): stable at baseline. See #1     Obesity: BMI 38.2. OP follow up  Procedures:  none  Consultations:  none  Discharge Exam: Filed Vitals:   05/20/15 1525  BP: 121/57  Pulse:   Temp:   Resp:     General: obese  Appears comfortable Cardiovascular: RRR  No MGR NO LE edema Respiratory: normal effort BS clear bilaterally no wheeze  Discharge Instructions   Discharge Instructions    Diet - low sodium heart healthy    Complete by:  As directed      Discharge instructions    Complete by:  As directed   Follow up with PCP as scheduled. Follow up with cardiology 05/28/15 as scheduled     Increase activity slowly    Complete by:  As directed           Current Discharge Medication List    CONTINUE these medications which have CHANGED   Details  hydrochlorothiazide (HYDRODIURIL) 25 MG tablet Take 1 tablet (25 mg total) by mouth every other day. Hold until 05/22/15 then resume every other day schedule      CONTINUE these medications which have NOT CHANGED   Details  aspirin 81 MG tablet Take 81 mg by mouth daily.    Calcium Carbonate (CALCIUM 600 PO) Take 600 mg by mouth daily.     cetirizine (ZYRTEC) 10 MG tablet Take 10 mg by mouth daily.    Cholecalciferol (VITAMIN D)  1000 UNITS capsule Take 1,000 Units by mouth daily.    DULoxetine (CYMBALTA) 30 MG capsule Take 30 mg by mouth 2 (two) times daily. Refills: 0    ferrous sulfate 325 (65 FE) MG tablet Take 325 mg by mouth daily with breakfast.    LIVALO 4 MG TABS Take 4 mg by mouth daily. Refills: 0    losartan (COZAAR) 50 MG tablet Take 50 mg by mouth daily.    Magnesium 250 MG TABS Take 250 mg by mouth 2 (two) times daily.     metFORMIN (GLUCOPHAGE) 1000 MG tablet Take 1,000 mg by mouth 2 (two) times daily with a meal.    NOVOLOG MIX 70/30 FLEXPEN (70-30) 100 UNIT/ML FlexPen Inject 30 Units into the skin 2 (two) times daily. Refills: 1    Omega-3 Fatty Acids (FISH OIL PO) Take 1 tablet by mouth daily.     pantoprazole (PROTONIX) 40 MG tablet Take 40 mg by mouth daily.      STOP taking these medications     nitrofurantoin, macrocrystal-monohydrate, (MACROBID) 100 MG capsule  Allergies  Allergen Reactions  . Macrobid [Nitrofurantoin Monohyd Macro] Hives and Itching    Chest pain  . Biaxin [Clarithromycin] Other (See Comments)    UNKNOWN   Follow-up Information    Schedule an appointment as soon as possible for a visit with Holland Commons, FNP.   Specialty:  Internal Medicine   Contact information:   [place] [address] [phone]       Follow up with Leonie Man, MD On 05/28/2015.   Specialty:  Cardiology   Why:  appointment at 9:45 am   Contact information:   [address] [place] [address] [phone]        The results of significant diagnostics from this hospitalization (including imaging, microbiology, ancillary and laboratory) are listed below for reference.    Significant Diagnostic Studies: Dg Chest 2 View  05/19/2015   CLINICAL DATA:  59 year old female with dizziness and chest pain. Initial encounter.  EXAM: CHEST  2 VIEW  COMPARISON:  Chest CTA 02/13/2015 and earlier.  FINDINGS: Stable mild  eventration of the right hemidiaphragm. Stable lung volumes. Normal cardiac size and mediastinal contours. Visualized tracheal air column is within normal limits. No pneumothorax, pulmonary edema, pleural effusion or confluent pulmonary opacity. Stable cholecystectomy clips. No acute osseous abnormality identified.  IMPRESSION: No acute cardiopulmonary abnormality.   Electronically Signed   By: Genevie Ann M.D.   On: 05/19/2015 23:57   Ct Angio Chest Pe W/cm &/or Wo Cm  05/20/2015   CLINICAL DATA:  59 year old female with dizziness, chest pain, syncope. Initial encounter.  EXAM: CT ANGIOGRAPHY CHEST WITH CONTRAST  TECHNIQUE: Multidetector CT imaging of the chest was performed using the standard protocol during bolus administration of intravenous contrast. Multiplanar CT image reconstructions and MIPs were obtained to evaluate the vascular anatomy.  CONTRAST:  100 mL Omnipaque 350.  COMPARISON:  Chest radiographs 05/19/2015 and earlier. Chest CTA 02/13/2015.  FINDINGS: Adequate contrast bolus timing in the pulmonary arterial tree. Mild respiratory motion artifact. Large body habitus. No focal filling defect identified in the pulmonary arterial tree to suggest the presence of acute pulmonary embolism.Patchy peripheral opacity in the left upper lobe (series 407, image 46) is new. Similar patchy opacity in the azygoesophageal recess and medial basal segments of the right lower lobe is chronic but increased. Major airways are patent. Stable mild peribronchovascular ground-glass opacity in the left lower lobe. Stable small subpleural lung nodules in the right middle lobe. No pleural effusion.  No pericardial effusion. Negative visualized aorta aside from atherosclerosis. No mediastinal or hilar lymphadenopathy. No axillary lymphadenopathy.  Stable visualized liver, spleen, pancreas, adrenal glands, kidneys, and bowel in the upper abdomen.  Degenerative changes in the thoracic spine. Stable visualized osseous structures.   Review of the MIP images confirms the above findings.  IMPRESSION: 1.  No evidence of acute pulmonary embolus. 2. New patchy peripheral opacity in the left upper lobe most suggestive of developing pneumonia. Acute on chronic right lower lobe opacity suspicious for developing infection in an area of chronic scarring. No pleural effusion. 3. Otherwise stable chest.   Electronically Signed   By: Genevie Ann M.D.   On: 05/20/2015 02:49    Microbiology: Recent Results (from the past 240 hour(s))  MRSA PCR Screening     Status: None   Collection Time: 05/20/15  6:45 AM  Result Value Ref Range Status   MRSA by PCR NEGATIVE NEGATIVE Final    Comment:  The GeneXpert MRSA Assay (FDA approved for NASAL specimens only), is one component of a comprehensive MRSA colonization surveillance program. It is not intended to diagnose MRSA infection nor to guide or monitor treatment for MRSA infections.      Labs: Basic Metabolic Panel:  Recent Labs Lab 05/20/15 0029 05/20/15 0945  NA 137  --   K 3.2*  --   CL 100*  --   CO2 24  --   GLUCOSE 169*  --   BUN 15  --   CREATININE 0.98 0.96  CALCIUM 9.3  --   MG  --  1.2*   Liver Function Tests: No results for input(s): AST, ALT, ALKPHOS, BILITOT, PROT, ALBUMIN in the last 168 hours. No results for input(s): LIPASE, AMYLASE in the last 168 hours. No results for input(s): AMMONIA in the last 168 hours. CBC:  Recent Labs Lab 05/20/15 0029 05/20/15 0945  WBC 7.2 12.2*  NEUTROABS 6.1  --   HGB 11.4* 10.7*  HCT 35.5* 33.1*  MCV 74.1* 73.1*  PLT 286 251   Cardiac Enzymes:  Recent Labs Lab 05/20/15 0029 05/20/15 0322 05/20/15 1231  TROPONINI <0.03 <0.03 <0.03   BNP: BNP (last 3 results) No results for input(s): BNP in the last 8760 hours.  ProBNP (last 3 results) No results for input(s): PROBNP in the last 8760 hours.  CBG:  Recent Labs Lab 05/20/15 0629 05/20/15 1136  GLUCAP 125* 290*       Signed:  Dyanne Carrel  M  [facility] 05/20/2015, 3:41 PM

## 2015-05-20 NOTE — Discharge Instructions (Signed)
You were seen today for chest pain. Your workup is reassuring. Of note, you did have a mildly low blood pressure. This improved with hydration. Your symptoms may have been related to an adverse medication reaction. Unclear of whether this was a true allergic reaction. He will be given 3 additional days of prednisone. Her antibiotics will be changed to Keflex. You to follow-up closely with your primary physician. If you do experience new or worsening symptoms he should be reevaluated immediately.  Drug Allergy Allergic reactions to medicines are common. Some allergic reactions are mild. A delayed type of drug allergy that occurs 1 week or more after exposure to a medicine or vaccine is called serum sickness. A life-threatening, sudden (acute) allergic reaction that involves the whole body is called anaphylaxis. CAUSES  "True" drug allergies occur when there is an allergic reaction to a medicine. This is caused by overactivity of the immune system. First, the body becomes sensitized. The immune system is triggered by your first exposure to the medicine. Following this first exposure, future exposure to the same medicine may be life-threatening. Almost any medicine can cause an allergic reaction. Common ones are:  Penicillin.  Sulfonamides (sulfa drugs).  Local anesthetics.  X-ray dyes that contain iodine. SYMPTOMS  Common symptoms of a minor allergic reaction are:  Swelling around the mouth.  An itchy red rash or hives.  Vomiting or diarrhea. Anaphylaxis can cause swelling of the mouth and throat. This makes it difficult to breathe and swallow. Severe reactions can be fatal within seconds, even after exposure to only a trace amount of the drug that causes the reaction. HOME CARE INSTRUCTIONS  If you are unsure of what caused your reaction, write down:  The names of the medicines you took.  How much medicine you took.  How you took the medicine, such as whether you took a pill, injected  the medicine, or applied it to your skin.  All of the things you ate and drank.  The date and time of your reaction.  The symptoms of the reaction.  You may want to follow up with an allergy specialist after the reaction has cleared in order to be tested to confirm the allergy. It is important to confirm that your reaction is an allergy, not just a side effect to the medicine. If you have a true allergy to a medicine, this may prevent that medicine and related medicines from being given to you when you are very ill.  If you have hives or a rash:  Take medicines as directed by your caregiver.  You may use an over-the-counter antihistamine (diphenhydramine) as needed.  Apply cold compresses to the skin or take baths in cool water. Avoid hot baths or showers.  If you are severely allergic:  Continuous observation after a severe reaction may be needed. Hospitalization is often required.  Wear a medical alert bracelet or necklace stating your allergy.  You and your family must learn how to use an anaphylaxis kit or give an epinephrine injection to temporarily treat an emergency allergic reaction. If you have had a severe reaction, always carry your epinephrine injection or anaphylaxis kit with you. This can be lifesaving if you have a severe reaction.  Do not drive or perform tasks after treatment until the medicines used to treat your reaction have worn off, or until your caregiver says it is okay.  If you have a drug allergy that was confirmed by your health care provider:  Carry information about the drug  allergy with you at all times.  Always check with a pharmacist before taking any over-the-counter medicine. SEEK MEDICAL CARE IF:   You think you had an allergic reaction. Symptoms usually start within 30 minutes after exposure.  Symptoms are getting worse rather than better.  You develop new symptoms.  The symptoms that brought you to your caregiver return. SEEK IMMEDIATE  MEDICAL CARE IF:   You have swelling of the mouth, difficulty breathing, or wheezing.  You have a tight feeling in your chest or throat.  You develop hives, swelling, or itching all over your body.  You develop severe vomiting or diarrhea.  You feel faint or pass out. This is an emergency. Use your epinephrine injection or anaphylaxis kit as you have been instructed. Call for emergency medical help. Even if you improve after the injection, you need to be examined at a hospital emergency department. MAKE SURE YOU:   Understand these instructions.  Will watch your condition.  Will get help right away if you are not doing well or get worse.   This information is not intended to replace advice given to you by your health care provider. Make sure you discuss any questions you have with your health care provider.   Document Released: 07/26/2005 Document Revised: 08/16/2014 Document Reviewed: 02/25/2015 Elsevier Interactive Patient Education [employer].

## 2015-05-20 NOTE — H&P (Signed)
[facility] History and Physical  Erick Oxendine HQI:[id number] DOB: 09-19-1955 DOA: 05/19/2015  Referring physician: Dina Rich  PCP: Holland Commons, FNP   Chief Complaint: chest pain  HPI: Dominique Dillon is a very pleasant 59 y.o. female with past medical hx including breast cancer completed chemo 2 months ago, HTN, DM, GERD, obesity degenerative disc disease presents to the emergency department with a chief complaint of sudden onset sharp left anterior chest pain. Initial evaluation reveals slightly abnormal EKG with initial troponin negative. Patient reports she was in her usual state of health when she had some lab work done in anticipation of her routine PCP visit. PCP office informed her she had a urinary tract infection and started Macrobid. Patient states she took 1 dose 9 PM and 45 minutes later developed sudden onset left anterior chest pain. She describes the pain as a pressure and aching and rates it a 10 out of 10. She states the pain did not radiate. Associated symptoms include diaphoresis and mild shortness of breath and sensation that something was "caught in her throat".. She denies nausea vomiting. She called 911 and recalls EMS stating her blood pressure was low. Per the fire department patient passed out when they sat her up but she has no recollection of this. She denies fever chills abdominal pain diarrhea. He denies any cough or dyspnea on exertion lower extremity edema. She states the pain was constant at 8 out of 10 after she took the aspirin and lasted until she got to the emergency department.  Workup in the emergency department reveals initial troponin negative, EKG with slightly prolonged QT, elevated d-dimer. CT of the chest no evidence of acute pulmonary embolus. New patchy peripheral opacity in the left upper lobe most suggestive of developing pneumonia. Acute on chronic right lower lobe opacity suspicious for developing infection in an area of chronic scarring. No  pleural effusion. Otherwise stable chest, urinalysis negative. In the emergency department she presented with a blood pressure of 88/50 received IV fluids and at the time of my exam hemodynamically stable, afebrile and not hypoxic. She is provided with GI cocktail, Pepcid, IV fluids, prednisone.  At the time of my exam she is chest pain-free, with stable vital signs.   Review of Systems:  10 point review of systems complete and all systems are negative except as indicated in the history of present illness Past Medical History  Diagnosis Date  . GERD (gastroesophageal reflux disease)   . Obesity   . Breast cancer of upper-outer quadrant of left female breast ([place]) 09/12/2014  . History of anemia     still takes iron supplement  . Arthritis     knees  . Hypertension     under control with med., has been on med. since 1990s  . Insulin dependent diabetes mellitus ([place])   . Seasonal asthma     no current med.  . DDD (degenerative disc disease), cervical    Past Surgical History  Procedure Laterality Date  . Cesarean section      x 2  . Knee arthroscopy Bilateral   . Abdominal hysterectomy      complete  . Cholecystectomy    . Appendectomy    . Tonsillectomy    . Irrigation and debridement abscess N/A 03/08/2014    Procedure: IRRIGATION AND DEBRIDEMENT ABDOMINAL WALL ABSCESS;  Surgeon: Adin Hector, MD;  Location: [facility];  Service: General;  Laterality: N/A;  . Colonoscopy    . Upper gi endoscopy    .  Radioactive seed guided mastectomy with axillary sentinel lymph node biopsy Left 10/01/2014    Procedure: RADIOACTIVE SEED GUIDED LEFT BREAST LUMPECTOMY WITH LEFT  AXILLARY SENTINEL LYMPH NODE BIOPSY;  Surgeon: Rolm Bookbinder, MD;  Location: [facility];  Service: General;  Laterality: Left;  . Carpal tunnel release Right 08/06/2004  . Trigger finger release Right 08/06/2004    middle finger  . Portacath placement Right 10/21/2014    Procedure: INSERTION PORT-A-CATH;   Surgeon: Rolm Bookbinder, MD;  Location: [facility];  Service: General;  Laterality: Right;  . Re-excision of breast lumpectomy Left 10/21/2014    Procedure: RE-EXCISION OF BREAST CANCER, POSTERIOR MARGINS;  Surgeon: Rolm Bookbinder, MD;  Location: [facility];  Service: General;  Laterality: Left;   Social History:  reports that she has never smoked. She has never used smokeless tobacco. She reports that she does not drink alcohol or use illicit drugs.  Allergies  Allergen Reactions  . Macrobid [Nitrofurantoin Monohyd Macro] Hives and Itching    Chest pain  . Biaxin [Clarithromycin] Other (See Comments)    UNKNOWN    Family History  Problem Relation Age of Onset  . Prostate cancer Father   . Stomach cancer Maternal Aunt   . Pancreatic cancer Maternal Uncle   . Lung cancer Maternal Grandmother   . Breast cancer Maternal Aunt   . Pancreatic cancer Maternal Aunt   . Pancreatic cancer Maternal Uncle     Prior to Admission medications   Medication Sig Start Date End Date Taking? Authorizing Provider  aspirin 81 MG tablet Take 81 mg by mouth daily.   Yes Historical Provider, MD  Calcium Carbonate (CALCIUM 600 PO) Take 600 mg by mouth daily.    Yes Historical Provider, MD  cetirizine (ZYRTEC) 10 MG tablet Take 10 mg by mouth daily.   Yes Historical Provider, MD  Cholecalciferol (VITAMIN D) 1000 UNITS capsule Take 1,000 Units by mouth daily.   Yes Historical Provider, MD  DULoxetine (CYMBALTA) 30 MG capsule Take 30 mg by mouth 2 (two) times daily. 05/15/15  Yes Historical Provider, MD  ferrous sulfate 325 (65 FE) MG tablet Take 325 mg by mouth daily with breakfast.   Yes Historical Provider, MD  hydrochlorothiazide (HYDRODIURIL) 25 MG tablet Take 25 mg by mouth every other day.    Yes Historical Provider, MD  LIVALO 4 MG TABS Take 4 mg by mouth daily. 02/11/15  Yes Historical Provider, MD  losartan (COZAAR) 50 MG tablet Take 50 mg by mouth daily.   Yes  Historical Provider, MD  Magnesium 250 MG TABS Take 250 mg by mouth 2 (two) times daily.    Yes Historical Provider, MD  metFORMIN (GLUCOPHAGE) 1000 MG tablet Take 1,000 mg by mouth 2 (two) times daily with a meal.   Yes Historical Provider, MD  NOVOLOG MIX 70/30 FLEXPEN (70-30) 100 UNIT/ML FlexPen Inject 30 Units into the skin 2 (two) times daily. 04/30/15  Yes Historical Provider, MD  Omega-3 Fatty Acids (FISH OIL PO) Take 1 tablet by mouth daily.    Yes Historical Provider, MD  pantoprazole (PROTONIX) 40 MG tablet Take 40 mg by mouth daily.   Yes Historical Provider, MD  cephALEXin (KEFLEX) 500 MG capsule Take 1 capsule (500 mg total) by mouth 2 (two) times daily. 05/20/15   Merryl Hacker, MD  predniSONE (DELTASONE) 20 MG tablet Take 2 tablets (40 mg total) by mouth daily with breakfast. 05/20/15   Merryl Hacker, MD   Physical  Exam: Filed Vitals:   05/20/15 0700 05/20/15 0715 05/20/15 0730 05/20/15 0800  BP: 143/83 132/71 135/77 132/84  Pulse: 93 93 95 92  Temp:    97.8 F (36.6 C)  TempSrc:    Oral  Resp: 26 17 20 16   Height:      Weight:      SpO2: 96% 100% 99% 100%    Wt Readings from Last 3 Encounters:  05/20/15 97.5 kg (214 lb 15.2 oz)  05/02/15 94.575 kg (208 lb 8 oz)  03/24/15 92.987 kg (205 lb)    General:  Appears calm and comfortable Eyes: PERRL, normal lids, irises & conjunctiva ENT: grossly normal hearing, lips & tongue, mucous membranes of her mouth are moist and pink Neck: no LAD, masses or thyromegaly Cardiovascular: RRR, no m/r/g. No LE edema. Pulses present and palpable  Respiratory: CTA bilaterally, no w/r/r. Normal respiratory effort. No wheeze Abdomen: soft, ntnd, obese positive bowel sounds Skin: no rash or induration seen on limited exam Musculoskeletal: grossly normal tone BUE/BLE Psychiatric: grossly normal mood and affect, speech fluent and appropriate Neurologic: grossly non-focal. speech clear facial symmetry           Labs on  Admission:  Basic Metabolic Panel:  Recent Labs Lab 05/20/15 0029  NA 137  K 3.2*  CL 100*  CO2 24  GLUCOSE 169*  BUN 15  CREATININE 0.98  CALCIUM 9.3   Liver Function Tests: No results for input(s): AST, ALT, ALKPHOS, BILITOT, PROT, ALBUMIN in the last 168 hours. No results for input(s): LIPASE, AMYLASE in the last 168 hours. No results for input(s): AMMONIA in the last 168 hours. CBC:  Recent Labs Lab 05/20/15 0029  WBC 7.2  NEUTROABS 6.1  HGB 11.4*  HCT 35.5*  MCV 74.1*  PLT 286   Cardiac Enzymes:  Recent Labs Lab 05/20/15 0029 05/20/15 0322  TROPONINI <0.03 <0.03    BNP (last 3 results) No results for input(s): BNP in the last 8760 hours.  ProBNP (last 3 results) No results for input(s): PROBNP in the last 8760 hours.  CBG: No results for input(s): GLUCAP in the last 168 hours.  Radiological Exams on Admission: Dg Chest 2 View  05/19/2015   CLINICAL DATA:  59 year old female with dizziness and chest pain. Initial encounter.  EXAM: CHEST  2 VIEW  COMPARISON:  Chest CTA 02/13/2015 and earlier.  FINDINGS: Stable mild eventration of the right hemidiaphragm. Stable lung volumes. Normal cardiac size and mediastinal contours. Visualized tracheal air column is within normal limits. No pneumothorax, pulmonary edema, pleural effusion or confluent pulmonary opacity. Stable cholecystectomy clips. No acute osseous abnormality identified.  IMPRESSION: No acute cardiopulmonary abnormality.   Electronically Signed   By: Genevie Ann M.D.   On: 05/19/2015 23:57   Ct Angio Chest Pe W/cm &/or Wo Cm  05/20/2015   CLINICAL DATA:  59 year old female with dizziness, chest pain, syncope. Initial encounter.  EXAM: CT ANGIOGRAPHY CHEST WITH CONTRAST  TECHNIQUE: Multidetector CT imaging of the chest was performed using the standard protocol during bolus administration of intravenous contrast. Multiplanar CT image reconstructions and MIPs were obtained to evaluate the vascular anatomy.   CONTRAST:  100 mL Omnipaque 350.  COMPARISON:  Chest radiographs 05/19/2015 and earlier. Chest CTA 02/13/2015.  FINDINGS: Adequate contrast bolus timing in the pulmonary arterial tree. Mild respiratory motion artifact. Large body habitus. No focal filling defect identified in the pulmonary arterial tree to suggest the presence of acute pulmonary embolism.Patchy peripheral opacity in the left upper lobe (  series 407, image 46) is new. Similar patchy opacity in the azygoesophageal recess and medial basal segments of the right lower lobe is chronic but increased. Major airways are patent. Stable mild peribronchovascular ground-glass opacity in the left lower lobe. Stable small subpleural lung nodules in the right middle lobe. No pleural effusion.  No pericardial effusion. Negative visualized aorta aside from atherosclerosis. No mediastinal or hilar lymphadenopathy. No axillary lymphadenopathy.  Stable visualized liver, spleen, pancreas, adrenal glands, kidneys, and bowel in the upper abdomen.  Degenerative changes in the thoracic spine. Stable visualized osseous structures.  Review of the MIP images confirms the above findings.  IMPRESSION: 1.  No evidence of acute pulmonary embolus. 2. New patchy peripheral opacity in the left upper lobe most suggestive of developing pneumonia. Acute on chronic right lower lobe opacity suspicious for developing infection in an area of chronic scarring. No pleural effusion. 3. Otherwise stable chest.   Electronically Signed   By: Genevie Ann M.D.   On: 05/20/2015 02:49    EKG: Independently reviewed Sinus tachycardia Right axis deviation Borderline repolarization abnormality Prolonged QT interval  Assessment/Plan Principal Problem:   Chest pain at rest: Patient with a history of hypertension hyperlipidemia, GERD. Heart score 3-4. May be related to reaction to antibiotic. Will admit to rule out. Initial troponin negative EKG with mild prolonged QT no events on telemetry. Will cycle  troponin repeat EKG. Continue aspirin statin. Patient reports she had a stress test "many many years ago" that she reports was normal. Her primary cardiologist is Dr. Ellyn Hack. She has an appointment with him scheduled for follow-up October 19 assuming she rules out. Consider outpatient stress test Active Problems: Hypotension/ Syncope: Reported per EMS. They be related to reaction to antibiotic. She received IV fluids in the emergency department and is hemodynamically stable. Will ambulate with assistance will check orthostatics. Will hold HCTZ. Monitor blood pressure closely.  Abnormal CT of the chest. Patient had an elevated d-dimer CT angios of the chest negative for PE but revealed new patchy peripheral pacer he concerning for pneumonia. She is afebrile hemodynamically stable no leukocytosis. She has some point with PCP in 2 days. Recommend repeat x-ray  Hypokalemia: Mild. Will replete. Will check magnesium level    Diabetes mellitus type 2, controlled ([place]): We'll obtain a hemoglobin A1c. We'll hold her metformin and her 7939 for now. Use sliding scale for optimal control    Essential hypertension, benign: See above. Will continue her losartan with parameters. Will hold HCTZ.    GERD (gastroesophageal reflux disease): Continue her PPI         Obesity: BMI 38.2. Nutritional consult     Code Status: Full DVT Prophylaxis: Family Communication: None present Disposition Plan: If she rules out may discharge this afternoon. If not certainly first thing in the morning. I called her primary cardiologist office and she has an appointment with him October 19 for follow-up and possible outpatient stress test  Time spent: 65 minutes  [facility]

## 2015-05-20 NOTE — Progress Notes (Signed)
DC orders received.  Patient stable with no S/S of distress.  Medication and discharge information reviewed with patient.  Patient DC home with daughter. Dominique Dillon, Dominique Dillon Sportsman

## 2015-05-20 NOTE — Progress Notes (Signed)
Patient ambulated in hallway 350 feet; no complaints of SOB/dizziness/CP; VSS.  Will continue to monitor. [place], Ardeth Sportsman

## 2015-05-21 LAB — HEMOGLOBIN A1C
Hgb A1c MFr Bld: 8.1 % — ABNORMAL HIGH (ref 4.8–5.6)
MEAN PLASMA GLUCOSE: 186 mg/dL

## 2015-05-23 ENCOUNTER — Encounter: Payer: BC Managed Care – PPO | Admitting: Nurse Practitioner

## 2015-05-28 ENCOUNTER — Encounter: Payer: Self-pay | Admitting: Cardiology

## 2015-05-28 ENCOUNTER — Ambulatory Visit (INDEPENDENT_AMBULATORY_CARE_PROVIDER_SITE_OTHER): Payer: BC Managed Care – PPO | Admitting: Cardiology

## 2015-05-28 VITALS — BP 90/60 | HR 88 | Ht 63.0 in | Wt 205.0 lb

## 2015-05-28 DIAGNOSIS — I739 Peripheral vascular disease, unspecified: Secondary | ICD-10-CM | POA: Insufficient documentation

## 2015-05-28 DIAGNOSIS — E669 Obesity, unspecified: Secondary | ICD-10-CM

## 2015-05-28 DIAGNOSIS — I951 Orthostatic hypotension: Secondary | ICD-10-CM

## 2015-05-28 DIAGNOSIS — R072 Precordial pain: Secondary | ICD-10-CM | POA: Insufficient documentation

## 2015-05-28 DIAGNOSIS — E66812 Obesity, class 2: Secondary | ICD-10-CM

## 2015-05-28 DIAGNOSIS — R079 Chest pain, unspecified: Secondary | ICD-10-CM | POA: Diagnosis not present

## 2015-05-28 DIAGNOSIS — R55 Syncope and collapse: Secondary | ICD-10-CM

## 2015-05-28 DIAGNOSIS — I10 Essential (primary) hypertension: Secondary | ICD-10-CM

## 2015-05-28 DIAGNOSIS — R0989 Other specified symptoms and signs involving the circulatory and respiratory systems: Secondary | ICD-10-CM | POA: Insufficient documentation

## 2015-05-28 DIAGNOSIS — E1121 Type 2 diabetes mellitus with diabetic nephropathy: Secondary | ICD-10-CM

## 2015-05-28 DIAGNOSIS — Z794 Long term (current) use of insulin: Secondary | ICD-10-CM

## 2015-05-28 MED ORDER — LOSARTAN POTASSIUM 25 MG PO TABS: 25.0000 mg | ORAL_TABLET | Freq: Every day | ORAL | Status: DC

## 2015-05-28 NOTE — Progress Notes (Signed)
PATIENT: Dominique Dillon MRN: [id number] DOB: Nov 15, 1955 PCP: Holland Commons, FNP  Clinic Note: Chief Complaint  Patient presents with  . Follow-up    post hospital , no chest pain , no swelling  ,--AVE NOT BEN TAKING HCTZ SINCE HOSPITALIZATION  . Chest Pain    HPI: Dominique Dillon is a 59 y.o. female with a PMH below who presents today for hospital f/u.   Has PMH of Breast CA s/p Chemo Rx (completed in ~Aug 2016), HTN, DM-2 & Gerd. She apparently saw me several years ago & had a stress test performed to evaluate chest pain -- her old chart is not available to since it is in long-term storage  Admitted 10/10-06/2015 - Sudden onset Sharp L Anterior Chest pain -- ~ slightly abnormal EKG.  This began shortly after taking 1st dose of Macrobid for UTI.  --> Pressure/aching pain ~10/10 without radiation w/ diaphoresis & mild dyspnea.  Felt throat tighness. EMS --> noted to be hypotensive & "passed out" when EMS sat her up. No F/C.  Pain continued @ ~8/10.  BP in ED 88/50 mmHg --> better with IVF. Elevated D-dimer, but no PE on CTA Chest.  ? PNA --> given Levaquin  -- Study reviewed: CT Angiogram of Chest IMPRESSION: 1. No evidence of acute pulmonary embolus. 2. New patchy peripheral opacity in the left upper lobe most suggestive of developing pneumonia. Acute on chronic right lower lobe opacity suspicious for developing infection in an area of chronic scarring. No pleural effusion. 3. Otherwise stable chest.  Interval History: Since d/c form hospital - no more severe CP, but still has strange sensation in L upper chest & fullness in throat. -- Seems to be worse with walking.  Tries to walk as much as her legs will tolerate (since chemo, has had tired / fatigue in her legs with intermittent cramping).   BP has been low , but no further syncope or near syncope.  Has been holding HCTZ - with plan to take every other day to combat edema  The remainder of cardiac review of systems is as follows:  Cardiovascular ROS: positive for - dyspnea on exertion and improved since completing XRT negative for - chest pain, edema, irregular heartbeat, loss of consciousness, orthopnea, palpitations, paroxysmal nocturnal dyspnea, rapid heart rate, shortness of breath or TIA/Amaurosis fugax :   Past Medical History  Diagnosis Date  . GERD (gastroesophageal reflux disease)   . Obesity   . Breast cancer of upper-outer quadrant of left female breast ([place]) 09/12/2014  . History of anemia     still takes iron supplement  . Arthritis     knees  . Hypertension     under control with med., has been on med. since 1990s  . Insulin dependent diabetes mellitus ([place])   . Seasonal asthma     no current med.  . DDD (degenerative disc disease), cervical     Prior Cardiac Evaluation and Past Surgical History: Past Surgical History  Procedure Laterality Date  . Cesarean section      x 2  . Knee arthroscopy Bilateral   . Abdominal hysterectomy      complete  . Cholecystectomy    . Appendectomy    . Tonsillectomy    . Irrigation and debridement abscess N/A 03/08/2014    Procedure: IRRIGATION AND DEBRIDEMENT ABDOMINAL WALL ABSCESS;  Surgeon: Adin Hector, MD;  Location: [facility];  Service: General;  Laterality: N/A;  . Colonoscopy    . Upper gi endoscopy    .  Radioactive seed guided mastectomy with axillary sentinel lymph node biopsy Left 10/01/2014    Procedure: RADIOACTIVE SEED GUIDED LEFT BREAST LUMPECTOMY WITH LEFT  AXILLARY SENTINEL LYMPH NODE BIOPSY;  Surgeon: Rolm Bookbinder, MD;  Location: [address];  Service: General;  Laterality: Left;  . Carpal tunnel release Right 08/06/2004  . Trigger finger release Right 08/06/2004    middle finger  . Portacath placement Right 10/21/2014    Procedure: INSERTION PORT-A-CATH;  Surgeon: Rolm Bookbinder, MD;  Location: [place];  Service: General;  Laterality: Right;  . Re-excision of breast lumpectomy Left 10/21/2014     Procedure: RE-EXCISION OF BREAST CANCER, POSTERIOR MARGINS;  Surgeon: Rolm Bookbinder, MD;  Location: [place];  Service: General;  Laterality: Left;    Allergies  Allergen Reactions  . Macrobid [Nitrofurantoin Monohyd Macro] Hives and Itching    Chest pain  . Biaxin [Clarithromycin] Other (See Comments)    UNKNOWN    Current Outpatient Prescriptions  Medication Sig Dispense Refill  . aspirin 81 MG tablet Take 81 mg by mouth daily.    . Calcium Carbonate (CALCIUM 600 PO) Take 600 mg by mouth daily.     . cetirizine (ZYRTEC) 10 MG tablet Take 10 mg by mouth daily.    . Cholecalciferol (VITAMIN D) 1000 UNITS capsule Take 1,000 Units by mouth daily.    . DULoxetine (CYMBALTA) 60 MG capsule Take 60 mg by mouth daily.    . ferrous sulfate 325 (65 FE) MG tablet Take 325 mg by mouth daily with breakfast.    . LIVALO 4 MG TABS Take 4 mg by mouth daily.  0  . Magnesium 250 MG TABS Take 250 mg by mouth 2 (two) times daily.     . metFORMIN (GLUCOPHAGE) 1000 MG tablet Take 1,000 mg by mouth 2 (two) times daily with a meal.    . NOVOLOG MIX 70/30 FLEXPEN (70-30) 100 UNIT/ML FlexPen Inject 32 Units into the skin 2 (two) times daily.   1  . Omega-3 Fatty Acids (FISH OIL PO) Take 1 tablet by mouth daily.     . pantoprazole (PROTONIX) 40 MG tablet Take 40 mg by mouth daily.    . hydrochlorothiazide (HYDRODIURIL) 25 MG tablet Take 1 tablet (25 mg total) by mouth every other day. Hold until 05/22/15 then resume every other day schedule (Patient not taking: Reported on 05/28/2015)    . losartan (COZAAR) 25 MG tablet Take 1 tablet (25 mg total) by mouth daily. 25 tablet 11   No current facility-administered medications for this visit.    Social History  Substance Use Topics  . Smoking status: Never Smoker   . Smokeless tobacco: Never Used  . Alcohol Use: No    family history includes Breast cancer in her maternal aunt; Lung cancer in her maternal grandmother; Pancreatic cancer in  her maternal aunt, maternal uncle, and maternal uncle; Prostate cancer in her father; Stomach cancer in her maternal aunt.  ROS: A comprehensive Review of Systems - was performed; if not noted in history of present illness -- Review of Systems  Constitutional: Negative for malaise/fatigue.  HENT: Negative for nosebleeds.   Respiratory: Positive for cough (Occasional).   Cardiovascular: Positive for claudication (Leg pain with walking. Not clear if this is true claudication).  Gastrointestinal: Positive for heartburn (Intermittent). Negative for blood in stool and melena.  Genitourinary: Negative for hematuria.  Neurological: Positive for dizziness. Negative for weakness and headaches.  Endo/Heme/Allergies: Does not bruise/bleed easily.  Psychiatric/Behavioral:  The patient is not nervous/anxious.   All other systems reviewed and are negative.   PHYSICAL EXAM BP 90/60 mmHg  Pulse 88  Ht 5\' 3"  (1.6 m)  Wt 205 lb (92.987 kg)  BMI 36.32 kg/m2 General appearance: alert, cooperative, appears stated age, no distress, moderately obese and Well-nourished and well-groomed Neck: no adenopathy, no carotid bruit, no JVD and supple, symmetrical, trachea midline Lungs: clear to auscultation bilaterally, normal percussion bilaterally and Nonlabored, good air movement Heart: regular rate and rhythm, S1, S2 normal, no murmur, click, rub or gallop and normal apical impulse Abdomen: soft, non-tender; bowel sounds normal; no masses,  no organomegaly Extremities: extremities normal, atraumatic, no cyanosis or edema Pulses: 2+ and symmetric Skin: Skin color, texture, turgor normal. No rashes or lesions Neurologic: Alert and oriented X 3, normal strength and tone. Normal symmetric reflexes. Normal coordination and gait Cranial nerves: normal   Adult ECG Report - no new EKG  Recent Labs:   05/19/2015: TC 209, TG 179, HDL 48, LDL 125.;; HgbA1c 7.4  05/23/2015: Sodium 140, potassium 4.4, chloride 102,  bicarbonate 30, BUN 16, creatinine 0.8, glucose 149, calcium 9.9. Magnesium 1.7.;  CBC: W4.7, H/H 11.1 / 34.3, to 99  ASSESSMENT / PLAN:  Dominique Dillon presents for reevaluation for cardiology standpoint: An episode of what sounds like hypotension related syncope as well as some chest pain. She has hypertension, hyperlipidemia and diabetes mellitus.  Problem List Items Addressed This Visit    Syncope    Her episode really sounded like she was dehydrated and became hypotensive. I discussed importance of medication that she stays adequately hydrated. Were also discontinuing HCTZ and reducing ARB dose to avoid hypotension.. No symptoms to suggest tachycardia or arrhythmia.      Relevant Medications   losartan (COZAAR) 25 MG tablet   Right carotid bruit   Relevant Orders   VAS US CAROTID   Myocardial Perfusion Imaging   VAS [place] LOWER EXTREMITY ARTERIAL DUPLEX   Obesity, Class II, BMI 35-39.9 (Chronic)    With the diabetes and hypertension plus obesity, she meets criteria for metabolic syndrome. This puts her at higher risk for coronary disease. She would like to do access program, but is somewhat scared mellitus episode of chest pain.  A normal result for stress test would be reassuring for her to get back and exercise.      Intermittent claudication (HCC) (Chronic)   Relevant Medications   losartan (COZAAR) 25 MG tablet   Other Relevant Orders   VAS US CAROTID   Myocardial Perfusion Imaging   VAS [place] LOWER EXTREMITY ARTERIAL DUPLEX   Hypotension    Plan is to reduce Cozaar to 25 mg daily.      Relevant Medications   losartan (COZAAR) 25 MG tablet   Other Relevant Orders   VAS US CAROTID   Myocardial Perfusion Imaging   VAS [place] LOWER EXTREMITY ARTERIAL DUPLEX   Essential hypertension, benign    Hypotensive today. Agree with using HCTZ less frequently. Would just recommend when necessary. Reduce ARB dose      Relevant Medications   losartan (COZAAR) 25 MG tablet   Other Relevant  Orders   VAS US CAROTID   Myocardial Perfusion Imaging   VAS [place] LOWER EXTREMITY ARTERIAL DUPLEX   Diabetes mellitus type 2, controlled (HCC) (Chronic)   Relevant Medications   losartan (COZAAR) 25 MG tablet   Other Relevant Orders   VAS US CAROTID   Myocardial Perfusion Imaging   VAS [place] LOWER EXTREMITY ARTERIAL  DUPLEX   Chest pain with moderate risk for cardiac etiology - Primary    Substernal chest pain with mild EKG changes followed by an episode of syncope. In a patient with risk factors of hypertension, hyperlipidemia and diabetes, at least moderate risk for cardiac etiology. . Plan: Treadmill Myoview      Relevant Orders   VAS US CAROTID   Myocardial Perfusion Imaging   VAS [place] LOWER EXTREMITY ARTERIAL DUPLEX   Chest pain at rest   Relevant Orders   VAS US CAROTID   Myocardial Perfusion Imaging   VAS [place] LOWER EXTREMITY ARTERIAL DUPLEX      Meds ordered this encounter  Medications  . DULoxetine (CYMBALTA) 60 MG capsule    Sig: Take 60 mg by mouth daily.  Marland Kitchen losartan (COZAAR) 25 MG tablet    Sig: Take 1 tablet (25 mg total) by mouth daily.    Dispense:  25 tablet    Refill:  11   PATIENT INSTRUCTIONS: Continue to not taking HCTZ- USE ONLY IF NEEDED  DO NOT TAKE LOSARTAN TODAY ,STARTING TOMORROW  - 1/2 TABLET OF 50 MG DAILY OF LOSARTAN ==25 MG DAILY  Followup: 1-2 months; after all studies done  Leonie Man, M.D., M.S. Interventional Cardiologist   Pager # [phone]

## 2015-05-28 NOTE — Patient Instructions (Addendum)
Your physician has requested that you have en exercise stress myoview. For further information please visit [email or web address]. Please follow instruction sheet, as given.  Your physician has requested that you have a carotid duplex. This test is an ultrasound of the carotid arteries in your neck. It looks at blood flow through these arteries that supply the brain with blood. Allow one hour for this exam. There are no restrictions or special instructions.  Your physician has requested that you have a lower  extremity arterial duplex. This test is an ultrasound of the arteries in the legs. It looks at arterial blood flow in the leg. Allow one hour for Lower Arterial scans. There are no restrictions or special instructionr  Continue to not taking HCTZ- USE ONLY IF NEEDED  DO NOT TAKE LOSARTAN TODAY ,STARTING TOMORROW  - 1/2 TABLET OF 50 MG DAILY OF LOSARTAN ==25 MG DAILY  Your physician recommends that you schedule a follow-up appointment in AFTER ALL TEST ARE COMPLETED WITH DR HARDING.

## 2015-05-29 ENCOUNTER — Encounter: Payer: Self-pay | Admitting: Cardiology

## 2015-05-30 ENCOUNTER — Encounter: Payer: Self-pay | Admitting: Nurse Practitioner

## 2015-05-30 ENCOUNTER — Encounter: Payer: Self-pay | Admitting: Cardiology

## 2015-05-30 ENCOUNTER — Ambulatory Visit (HOSPITAL_BASED_OUTPATIENT_CLINIC_OR_DEPARTMENT_OTHER): Payer: BC Managed Care – PPO | Admitting: Nurse Practitioner

## 2015-05-30 VITALS — BP 127/79 | HR 95 | Temp 98.6°F | Resp 18 | Ht 63.0 in | Wt 205.6 lb

## 2015-05-30 DIAGNOSIS — C50412 Malignant neoplasm of upper-outer quadrant of left female breast: Secondary | ICD-10-CM

## 2015-05-30 NOTE — Assessment & Plan Note (Signed)
With the diabetes and hypertension plus obesity, she meets criteria for metabolic syndrome. This puts her at higher risk for coronary disease. She would like to do access program, but is somewhat scared mellitus episode of chest pain.  A normal result for stress test would be reassuring for her to get back and exercise.

## 2015-05-30 NOTE — Assessment & Plan Note (Signed)
Plan is to reduce Cozaar to 25 mg daily.

## 2015-05-30 NOTE — Assessment & Plan Note (Signed)
Substernal chest pain with mild EKG changes followed by an episode of syncope. In a patient with risk factors of hypertension, hyperlipidemia and diabetes, at least moderate risk for cardiac etiology. . Plan: Treadmill Myoview

## 2015-05-30 NOTE — Progress Notes (Signed)
CLINIC:  Cancer Survivorship   REASON FOR VISIT:  Routine follow-up post-treatment for a recent history of breast cancer.  BRIEF ONCOLOGIC HISTORY:    Breast cancer of upper-outer quadrant of left female breast ([place])   08/15/2014 Mammogram Left breast: possible mass warranting further evaluation with spot compression views and possibly ultrasound. In the right breast, no findings suspicious for malignancy.   09/09/2014 Mammogram Left breast: MLO view, spot compression left CC and MLO views are submitted. There is a persistent spiculated mass in the upper-outer quadrant left breast.   09/09/2014 Breast US Left breast: 0.54 x 0.74 x 0.59 cm spiculated hypoechoic lesion at 1:30, 13 cm from nipple correlating to the mammographic finding. Ultrasound of the left axilla is negative.   09/09/2014 Initial Biopsy Left breast core needle bx: invasive ductal carcinoma, ER+ (11% - weak), PR- (0%), HER2/neu negative, Ki67 35%    09/09/2014 Clinical Stage Stage IA: T1b N0   10/01/2014 Definitive Surgery Left lumpectomy/[facility] Donne Hazel): Invasive ductal carcinoma, grade 3, positive margin, HER2/neu repeated and remains negative (ratio 1.28). 2 LN removed and negative (0/2), high grade DCIS.   10/01/2014 Procedure Mammaprint: High risk for recurrence; basal type; ER-, PR-, HER2/neu negative   10/01/2014 Pathologic Stage Stage I: pT1c pN0   10/21/2014 Surgery Left breast excision: benign fibrofatty soft tissue with no evidence of malignancy   11/11/2014 - 01/14/2015 Chemotherapy Adjuvant docetaxel and cyclophosphamide x 4 cycles with neulasta support   02/12/2015 - 03/25/2015 Radiation Therapy Adjuvant RT Isidore Moos): Left Breast, 50 Gy in 25 fractions. Left Breast Boost, 10 Gy in 5 fractions. Total dose: 60 Gy    INTERVAL HISTORY:  Dominique Dillon presents to the [facility] today for our initial meeting to review her survivorship care plan detailing her treatment course for breast cancer, as well as monitoring long-term side  effects of that treatment, education regarding health maintenance, screening, and overall wellness and health promotion.     Overall, Dominique Dillon reports feeling well since completion of her radiation therapy approximately two months ago.  She denies fatigue or continued skin changes since that time.  She continues with some darkening of her nails and toenails, secondary to the docetaxel.  She denies any change within her breast, headache, cough or shortness of breath. She denies any change in appetite or weight loss.  She recently had an episode of chest pain for which she was evaluated in the ED on May 19, 2015. Initial cardiac workup was negative and is seeing her cardiologist for additional workup.  There was some question of an allergic reaction to Macrobid, which she was prescribed for a URI. This was discontinued and she has had no further episodes of chest pain.  REVIEW OF SYSTEMS:  General: Denies fever, chills, or night sweats. Cardiac: Recent chest pain as above.  Denies today.  Denies palpitations and lower extremity edema.  Respiratory: Denies dyspnea on exertion.  GI: Denies abdominal pain, constipation, diarrhea, nausea, or vomiting.  GU: Denies dysuria, hematuria, vaginal bleeding, vaginal discharge, or vaginal dryness.  Musculoskeletal: Denies joint or bone pain.  Neuro: Denies headache or recent falls. Denies peripheral neuropathy. Skin: Denies rash, pruritis, or open wounds.  Breast: Denies any new nodularity, masses, tenderness, nipple changes, or nipple discharge.  Psych: Denies depression, anxiety, insomnia, or memory loss.   A 14-point review of systems was completed and was negative, except as noted above.   ONCOLOGY TREATMENT TEAM:  1. Surgeon:  Dr. Donne Hazel at [facility]  2. Medical  Oncologist: Dr. Jana Hakim 3. Radiation Oncologist: Dr. Isidore Moos    PAST MEDICAL/SURGICAL HISTORY:  Past Medical History  Diagnosis Date  . GERD (gastroesophageal reflux  disease)   . Obesity   . Breast cancer of upper-outer quadrant of left female breast ([place]) 09/12/2014  . History of anemia     still takes iron supplement  . Arthritis     knees  . Hypertension     under control with med., has been on med. since 1990s  . Insulin dependent diabetes mellitus ([place])   . Seasonal asthma     no current med.  . DDD (degenerative disc disease), cervical    Past Surgical History  Procedure Laterality Date  . Cesarean section      x 2  . Knee arthroscopy Bilateral   . Abdominal hysterectomy      complete  . Cholecystectomy    . Appendectomy    . Tonsillectomy    . Irrigation and debridement abscess N/A 03/08/2014    Procedure: IRRIGATION AND DEBRIDEMENT ABDOMINAL WALL ABSCESS;  Surgeon: Adin Hector, MD;  Location: [facility];  Service: General;  Laterality: N/A;  . Colonoscopy    . Upper gi endoscopy    . Radioactive seed guided mastectomy with axillary sentinel lymph node biopsy Left 10/01/2014    Procedure: RADIOACTIVE SEED GUIDED LEFT BREAST LUMPECTOMY WITH LEFT  AXILLARY SENTINEL LYMPH NODE BIOPSY;  Surgeon: Rolm Bookbinder, MD;  Location: [facility];  Service: General;  Laterality: Left;  . Carpal tunnel release Right 08/06/2004  . Trigger finger release Right 08/06/2004    middle finger  . Portacath placement Right 10/21/2014    Procedure: INSERTION PORT-A-CATH;  Surgeon: Rolm Bookbinder, MD;  Location: [facility];  Service: General;  Laterality: Right;  . Re-excision of breast lumpectomy Left 10/21/2014    Procedure: RE-EXCISION OF BREAST CANCER, POSTERIOR MARGINS;  Surgeon: Rolm Bookbinder, MD;  Location: [facility];  Service: General;  Laterality: Left;     ALLERGIES:  Allergies  Allergen Reactions  . Macrobid [Nitrofurantoin Monohyd Macro] Hives and Itching    Chest pain  . Biaxin [Clarithromycin] Other (See Comments)    UNKNOWN     CURRENT MEDICATIONS:  Current Outpatient Prescriptions  on File Prior to Visit  Medication Sig Dispense Refill  . aspirin 81 MG tablet Take 81 mg by mouth daily.    . Calcium Carbonate (CALCIUM 600 PO) Take 600 mg by mouth daily.     . cetirizine (ZYRTEC) 10 MG tablet Take 10 mg by mouth daily.    . Cholecalciferol (VITAMIN D) 1000 UNITS capsule Take 1,000 Units by mouth daily.    . DULoxetine (CYMBALTA) 60 MG capsule Take 60 mg by mouth daily.    . ferrous sulfate 325 (65 FE) MG tablet Take 325 mg by mouth daily with breakfast.    . LIVALO 4 MG TABS Take 4 mg by mouth daily.  0  . losartan (COZAAR) 25 MG tablet Take 1 tablet (25 mg total) by mouth daily. 25 tablet 11  . Magnesium 250 MG TABS Take 250 mg by mouth 2 (two) times daily.     . metFORMIN (GLUCOPHAGE) 1000 MG tablet Take 1,000 mg by mouth 2 (two) times daily with a meal.    . NOVOLOG MIX 70/30 FLEXPEN (70-30) 100 UNIT/ML FlexPen Inject 32 Units into the skin 2 (two) times daily.   1  . Omega-3 Fatty Acids (FISH OIL PO) Take 1 tablet by mouth daily.     Marland Kitchen  pantoprazole (PROTONIX) 40 MG tablet Take 40 mg by mouth daily.     No current facility-administered medications on file prior to visit.     ONCOLOGIC FAMILY HISTORY:  Family History  Problem Relation Age of Onset  . Prostate cancer Father   . Stomach cancer Maternal Aunt   . Pancreatic cancer Maternal Uncle   . Lung cancer Maternal Grandmother   . Breast cancer Maternal Aunt   . Pancreatic cancer Maternal Aunt   . Pancreatic cancer Maternal Uncle      GENETIC COUNSELING/TESTING: Patient declined.  SOCIAL HISTORY:  Dominique Dillon is widowed and lives with her adoptive daughter in [place], [place].  She has 2 children.  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  She denies any current or history of tobacco, alcohol, or illicit drug use.     PHYSICAL EXAMINATION:  Vital Signs:   Filed Vitals:   05/30/15 1006  BP: 127/79  Pulse: 95  Temp: 98.6 F (37 C)  Resp: 18   ECOG performance  status: 0 General: Well-nourished, well-appearing female in no acute distress.  She is unaccompanied in clinic today.   HEENT: Head is atraumatic and normocephalic.  Pupils equal and reactive to light and accomodation. Conjunctivae clear without exudate.  Sclerae anicteric. Oral mucosa is pink, moist, and intact without lesions.  Oropharynx is pink without lesions or erythema.  Lymph: No cervical, supraclavicular, infraclavicular, or axillary lymphadenopathy noted on palpation.  Cardiovascular: Regular rate and rhythm without murmurs, rubs, or gallops. Respiratory: Clear to auscultation bilaterally. Chest expansion symmetric without accessory muscle use on inspiration or expiration.  GI: Abdomen soft and round. No tenderness to palpation. Bowel sounds normoactive in 4 quadrants.  GU: Deferred.  Musculoskeletal: Muscle strength 5/5 in all extremities. Neuro: No focal deficits. Steady gait.  Psych: Mood and affect normal and appropriate for situation.  Extremities: No edema, cyanosis, or clubbing.  Skin: Warm and dry. No open lesions noted.   LABORATORY DATA:  None for this visit.  DIAGNOSTIC IMAGING:  None for this visit.     ASSESSMENT AND PLAN:   1. History of breast cancer: Stage IA invasive ductal carcinoma of the left breast, triple negative, with DCIS, S/P lumpectomy followed by adjuvant docetaxel and cyclophosphamide x 4 cycles and adjuvant RT to the left breast, now followed in a program of surveillance.  Dominique Dillon is doing well without clinical symptoms worrisome for disease recurrence at this time.  She will follow-up with her medical oncologist,  Dr. Jana Hakim, in February 2017 with history and physical exam per surveillance protocol.  A comprehensive survivorship care plan and treatment summary was extensively reviewed with the patient today detailing her breast cancer diagnosis, treatment course, potential late/long-term effects of treatment, appropriate follow-up care with  recommendations for the future, and patient education resources. A copy of this summary, along with a letter will be sent to the patient's primary care provider, cardiologist (Glenetta Hew MD), and OBGYN Newton Pigg MD) at her request via in basket message after today's visit.  Dominique Dillon is welcome to return to the Survivorship Clinic in the future, as needed; no follow-up will be scheduled at this time.    2. Cancer screening:  Due to Dominique Dillon's history and her age, she should receive screening for skin cancers and colon cancer.  She is S/P hysterectomy. The information and recommendations are listed on the patient's comprehensive care plan/treatment summary and were reviewed in detail with the patient.  3. Health maintenance and wellness promotion: Dominique Dillon was encouraged to consume 5-7 servings of fruits and vegetables per day. We reviewed the "Nutrition Rainbow" handout, as well as the handout about "Nutrition for Breast Cancer Survivors."  She was also encouraged to engage in moderate to vigorous exercise for 30 minutes per day most days of the week. We discussed the [employer] fitness program, which is designed for cancer survivors to help them become more physically fit after cancer treatments.  She was instructed to limit her alcohol consumption and continue to abstain from tobacco use.    4. Support services/counseling: It is not uncommon for this period of the patient's cancer care trajectory to be one of many emotions and stressors.  We discussed an opportunity for her to participate in the next session of [facility] ("Finding Your New Normal") support group series designed for patients after they have completed treatment.   Dominique Dillon was encouraged to take advantage of our many other support services programs, support groups, and/or counseling in coping with her new life as a cancer survivor after completing anti-cancer treatment.  She was offered support today through active listening and  expressive supportive counseling.  She was given information regarding our available services and encouraged to contact me with any questions or for help enrolling in any of our support group/programs.    A total of 50 minutes of face-to-face time was spent with this patient with greater than 50% of that time in counseling and care-coordination.   Sylvan Cheese, NP  Survivorship Program [facility] [phone]   Note: PRIMARY CARE PROVIDER [place] Reader [place], [place] [phone]

## 2015-05-30 NOTE — Assessment & Plan Note (Signed)
Her episode really sounded like she was dehydrated and became hypotensive. I discussed importance of medication that she stays adequately hydrated. Were also discontinuing HCTZ and reducing ARB dose to avoid hypotension.. No symptoms to suggest tachycardia or arrhythmia.

## 2015-05-30 NOTE — Assessment & Plan Note (Addendum)
Hypotensive today. Agree with using HCTZ less frequently. Would just recommend when necessary. Reduce ARB dose

## 2015-06-10 ENCOUNTER — Telehealth (HOSPITAL_COMMUNITY): Payer: Self-pay

## 2015-06-10 HISTORY — PX: NM MYOVIEW LTD: HXRAD82

## 2015-06-10 NOTE — Telephone Encounter (Signed)
Encounter complete. 

## 2015-06-11 ENCOUNTER — Telehealth (HOSPITAL_COMMUNITY): Payer: Self-pay

## 2015-06-11 NOTE — Telephone Encounter (Signed)
Encounter complete. 

## 2015-06-12 ENCOUNTER — Ambulatory Visit (HOSPITAL_BASED_OUTPATIENT_CLINIC_OR_DEPARTMENT_OTHER)
Admission: RE | Admit: 2015-06-12 | Discharge: 2015-06-12 | Disposition: A | Discharge status: Home / Self Care | Payer: BC Managed Care – PPO | Source: Ambulatory Visit | Attending: Cardiology | Admitting: Cardiology

## 2015-06-12 ENCOUNTER — Ambulatory Visit (HOSPITAL_COMMUNITY)
Admission: RE | Admit: 2015-06-12 | Discharge: 2015-06-12 | Disposition: A | Discharge status: Home / Self Care | Payer: BC Managed Care – PPO | Source: Ambulatory Visit | Attending: Cardiovascular Disease | Admitting: Cardiovascular Disease

## 2015-06-12 ENCOUNTER — Ambulatory Visit (HOSPITAL_BASED_OUTPATIENT_CLINIC_OR_DEPARTMENT_OTHER)
Admission: RE | Admit: 2015-06-12 | Discharge: 2015-06-12 | Disposition: A | Discharge status: Home / Self Care | Payer: BC Managed Care – PPO | Source: Ambulatory Visit | Attending: Cardiovascular Disease | Admitting: Cardiovascular Disease

## 2015-06-12 DIAGNOSIS — I739 Peripheral vascular disease, unspecified: Secondary | ICD-10-CM

## 2015-06-12 DIAGNOSIS — R9439 Abnormal result of other cardiovascular function study: Secondary | ICD-10-CM | POA: Insufficient documentation

## 2015-06-12 DIAGNOSIS — I10 Essential (primary) hypertension: Secondary | ICD-10-CM | POA: Insufficient documentation

## 2015-06-12 DIAGNOSIS — R0609 Other forms of dyspnea: Secondary | ICD-10-CM | POA: Diagnosis not present

## 2015-06-12 DIAGNOSIS — R55 Syncope and collapse: Secondary | ICD-10-CM | POA: Insufficient documentation

## 2015-06-12 DIAGNOSIS — I951 Orthostatic hypotension: Secondary | ICD-10-CM

## 2015-06-12 DIAGNOSIS — Z853 Personal history of malignant neoplasm of breast: Secondary | ICD-10-CM | POA: Insufficient documentation

## 2015-06-12 DIAGNOSIS — I6521 Occlusion and stenosis of right carotid artery: Secondary | ICD-10-CM | POA: Insufficient documentation

## 2015-06-12 DIAGNOSIS — E1121 Type 2 diabetes mellitus with diabetic nephropathy: Secondary | ICD-10-CM | POA: Diagnosis not present

## 2015-06-12 DIAGNOSIS — Z794 Long term (current) use of insulin: Secondary | ICD-10-CM | POA: Diagnosis not present

## 2015-06-12 DIAGNOSIS — R0989 Other specified symptoms and signs involving the circulatory and respiratory systems: Secondary | ICD-10-CM

## 2015-06-12 DIAGNOSIS — R079 Chest pain, unspecified: Secondary | ICD-10-CM

## 2015-06-12 DIAGNOSIS — R5383 Other fatigue: Secondary | ICD-10-CM | POA: Diagnosis not present

## 2015-06-12 LAB — MYOCARDIAL PERFUSION IMAGING
Estimated workload: 7 METS
Exercise duration (min): 5 min
Exercise duration (sec): 40 s
LV dias vol: 79 mL
LV sys vol: 29 mL
MPHR: 159 {beats}/min
Peak HR: 148 {beats}/min
Percent HR: 93 %
RPE: 16
Rest HR: 72 {beats}/min
SDS: 0
SRS: 0
SSS: 0
TID: 1.39

## 2015-06-12 MED ORDER — TECHNETIUM TC 99M SESTAMIBI GENERIC - CARDIOLITE
30.9000 | Freq: Once | INTRAVENOUS | Status: AC | PRN
  Administered 2015-06-12: 30.9 via INTRAVENOUS

## 2015-06-12 MED ORDER — TECHNETIUM TC 99M SESTAMIBI GENERIC - CARDIOLITE
10.6000 | Freq: Once | INTRAVENOUS | Status: AC | PRN
  Administered 2015-06-12: 10.6 via INTRAVENOUS

## 2015-06-13 ENCOUNTER — Telehealth: Payer: Self-pay | Admitting: *Deleted

## 2015-06-13 NOTE — Telephone Encounter (Signed)
Left message to call back - result myoview and patient needs follow up appointment for all test done

## 2015-06-13 NOTE — Telephone Encounter (Signed)
-----   Message from Leonie Man, MD sent at 06/12/2015  6:05 PM EDT ----- So, the nuclear stress test was read as LOW RISK.  Normal pump function. It did indicated Significant BP elevation with exercise -- need to treat BP. EKG portion was normal.  Imaging had a small sized defect that we cannot exclude "ischemia" with, but with a small distribution, recommendation for now is Medical Rx.  If Sx persist, consider Cath.  Leonie Man, MD

## 2015-06-13 NOTE — Telephone Encounter (Signed)
-----   Message from Leonie Man, MD sent at 06/13/2015  4:19 PM EDT ----- Good news - both Carotid & lower Extremity Artery dopplers look good.   Leonie Man, MD

## 2015-06-16 NOTE — Telephone Encounter (Signed)
Spoke to patient. Result given . Verbalized understanding  

## 2015-06-16 NOTE — Telephone Encounter (Signed)
left message to call back.

## 2015-06-16 NOTE — Telephone Encounter (Signed)
Returning your call from Friday. °

## 2015-06-18 ENCOUNTER — Encounter: Payer: Self-pay | Admitting: Cardiology

## 2015-06-27 ENCOUNTER — Encounter: Payer: Self-pay | Admitting: Radiation Oncology

## 2015-07-07 NOTE — Progress Notes (Signed)
Electron Holiday representative Note  Diagnosis:     ICD-9-CM ICD-10-CM   1. Breast cancer of upper-outer quadrant of left female breast 174.4 C50.412    Outpatient 03-10-15   The patient's CT images from her initial simulation were reviewed to plan her boost treatment to her left breast  lumpectomy cavity.  Measurements were made regarding the size and depth of the surgical bed. The boost to the lumpectomy cavity will be delivered with 15 MeV electrons; 10 Gy in 5 fractions has been prescribed to the 100% isodose line.   An electron Best boy was reviewed and approved.  A custom electron cut-out will be used for her boost field.    -----------------------------------  Eppie Gibson, MD

## 2015-08-18 ENCOUNTER — Ambulatory Visit: Payer: BC Managed Care – PPO | Admitting: Cardiology

## 2015-08-27 ENCOUNTER — Encounter: Payer: Self-pay | Admitting: Cardiology

## 2015-08-27 ENCOUNTER — Ambulatory Visit (INDEPENDENT_AMBULATORY_CARE_PROVIDER_SITE_OTHER): Payer: BC Managed Care – PPO | Admitting: Cardiology

## 2015-08-27 ENCOUNTER — Telehealth: Payer: Self-pay | Admitting: Nurse Practitioner

## 2015-08-27 ENCOUNTER — Encounter: Payer: Self-pay | Admitting: Nurse Practitioner

## 2015-08-27 ENCOUNTER — Ambulatory Visit (HOSPITAL_BASED_OUTPATIENT_CLINIC_OR_DEPARTMENT_OTHER): Payer: BC Managed Care – PPO | Admitting: Nurse Practitioner

## 2015-08-27 VITALS — BP 140/80 | HR 84 | Ht 63.0 in | Wt 209.0 lb

## 2015-08-27 VITALS — BP 158/80 | HR 83 | Temp 98.4°F | Resp 18 | Ht 63.0 in | Wt 211.5 lb

## 2015-08-27 DIAGNOSIS — N6459 Other signs and symptoms in breast: Secondary | ICD-10-CM | POA: Diagnosis not present

## 2015-08-27 DIAGNOSIS — E669 Obesity, unspecified: Secondary | ICD-10-CM

## 2015-08-27 DIAGNOSIS — N61 Mastitis without abscess: Secondary | ICD-10-CM

## 2015-08-27 DIAGNOSIS — N390 Urinary tract infection, site not specified: Secondary | ICD-10-CM | POA: Diagnosis not present

## 2015-08-27 DIAGNOSIS — C50412 Malignant neoplasm of upper-outer quadrant of left female breast: Secondary | ICD-10-CM | POA: Diagnosis not present

## 2015-08-27 DIAGNOSIS — E785 Hyperlipidemia, unspecified: Secondary | ICD-10-CM | POA: Diagnosis not present

## 2015-08-27 DIAGNOSIS — E119 Type 2 diabetes mellitus without complications: Secondary | ICD-10-CM | POA: Diagnosis not present

## 2015-08-27 DIAGNOSIS — R079 Chest pain, unspecified: Secondary | ICD-10-CM

## 2015-08-27 DIAGNOSIS — E8881 Metabolic syndrome: Secondary | ICD-10-CM

## 2015-08-27 DIAGNOSIS — I10 Essential (primary) hypertension: Secondary | ICD-10-CM

## 2015-08-27 MED ORDER — HYDROCHLOROTHIAZIDE 12.5 MG PO CAPS: 12.5000 mg | ORAL_CAPSULE | Freq: Every day | ORAL | Status: DC

## 2015-08-27 MED ORDER — LOSARTAN POTASSIUM 25 MG PO TABS: 25.0000 mg | ORAL_TABLET | Freq: Every day | ORAL | Status: DC

## 2015-08-27 NOTE — Progress Notes (Signed)
CLINIC:  Cancer Survivorship   REASON FOR VISIT:  Acute problem post-treatment for history of breast cancer.  BRIEF ONCOLOGIC HISTORY:    Breast cancer of upper-outer quadrant of left female breast ([place])   08/15/2014 Mammogram Left breast: possible mass warranting further evaluation with spot compression views and possibly ultrasound. In the right breast, no findings suspicious for malignancy.   09/09/2014 Mammogram Left breast: MLO view, spot compression left CC and MLO views are submitted. There is a persistent spiculated mass in the upper-outer quadrant left breast.   09/09/2014 Breast US Left breast: 0.54 x 0.74 x 0.59 cm spiculated hypoechoic lesion at 1:30, 13 cm from nipple correlating to the mammographic finding. Ultrasound of the left axilla is negative.   09/09/2014 Initial Biopsy Left breast core needle bx: invasive ductal carcinoma, ER+ (11% - weak), PR- (0%), HER2/neu negative, Ki67 35%    09/09/2014 Clinical Stage Stage IA: T1b N0   10/01/2014 Definitive Surgery Left lumpectomy/[facility] Donne Hazel): Invasive ductal carcinoma, grade 3, positive margin, HER2/neu repeated and remains negative (ratio 1.28). 2 LN removed and negative (0/2), high grade DCIS.   10/01/2014 Procedure Mammaprint: High risk for recurrence; basal type; ER-, PR-, HER2/neu negative   10/01/2014 Pathologic Stage Stage I: pT1c pN0   10/21/2014 Surgery Left breast excision: benign fibrofatty soft tissue with no evidence of malignancy   11/11/2014 - 01/14/2015 Chemotherapy Adjuvant docetaxel and cyclophosphamide x 4 cycles with neulasta support   02/12/2015 - 03/25/2015 Radiation Therapy Adjuvant RT Isidore Moos): Left Breast, 50 Gy in 25 fractions. Left Breast Boost, 10 Gy in 5 fractions. Total dose: 60 Gy   05/30/2015 Survivorship Survivorship visit completed today and copy of care plan provided to patient.    INTERVAL HISTORY:  Dominique Dillon presents to the Survivorship Clinic today for evaluation of new breast tenderness.  She called to  our office earlier today reporting the onset of left breast tenderness. She states that this has been going on for approximately two months and describes it as a throbbing. She states that it is tender and not warm to the touch, and she denies any underlying mass or lesion. She does feel that there is some swelling to her breast.  She recently (within the last 72 hours) was treated for an urinary tract infection and states that after the third day of antibiotics, that the tenderness and swelling began to improve.  However, it has returned since she stopped the antibiotics.  She denies any injury or trauma to the area.  Dominique Dillon has had no fever or chills. She denies any headache, cough, shortness of breath, or bone pain. She states that her urinary tract symptoms have improved.  She does have diabetes and states that she has had frequent infections (of varying types) in the past.  REVIEW OF SYSTEMS:  General: Denies fever, chills, unintentional weight loss, or generalized fatigue.  HEENT: Wears glasses. Ringing in ears (ongiong).  Otherwise, denies visual changes, hearing loss, mouth sores, or difficulty swallowing. Cardiac: Denies palpitations and lower extremity edema.  Respiratory: Denies wheeze or dyspnea on exertion.  Breast: Tenderness as above in left breast; right breast without mass, tenderness or swelling. GI: Denies abdominal pain, constipation, diarrhea, nausea, or vomiting.  GU: Denies dysuria, hematuria, vaginal bleeding, vaginal discharge, or vaginal dryness.  Musculoskeletal: Denies joint or bone pain.  Neuro: Peripheral neuropathy in her bilateral feet, improving. Denies recent fall. Skin: Swelling over left breast as above. Denies rash, pruritis, or open wounds.  Psych: Denies depression, anxiety, insomnia,  or memory loss.   A 14-point review of systems was completed and was negative, except as noted above.   ONCOLOGY TREATMENT TEAM:  1. Surgeon:  Dr. Donne [facility]el at [facility]  2. Medical Oncologist: Dr. Jana Hakim 3. Radiation Oncologist: Dr. Isidore Moos    PAST MEDICAL/SURGICAL HISTORY:  Past Medical History  Diagnosis Date  . GERD (gastroesophageal reflux disease)   . Obesity   . Breast cancer of upper-outer quadrant of left female breast ([place]) 09/12/2014  . History of anemia     still takes iron supplement  . Arthritis     knees  . Hypertension     under control with med., has been on med. since 1990s  . Insulin dependent diabetes mellitus ([place])   . Seasonal asthma     no current med.  . DDD (degenerative disc disease), cervical    Past Surgical History  Procedure Laterality Date  . Cesarean section      x 2  . Knee arthroscopy Bilateral   . Abdominal hysterectomy      complete  . Cholecystectomy    . Appendectomy    . Tonsillectomy    . Irrigation and debridement abscess N/A 03/08/2014    Procedure: IRRIGATION AND DEBRIDEMENT ABDOMINAL WALL ABSCESS;  Surgeon: Adin Hector, MD;  Location: [facility];  Service: General;  Laterality: N/A;  . Colonoscopy    . Upper gi endoscopy    . Radioactive seed guided mastectomy with axillary sentinel lymph node biopsy Left 10/01/2014    Procedure: RADIOACTIVE SEED GUIDED LEFT BREAST LUMPECTOMY WITH LEFT  AXILLARY SENTINEL LYMPH NODE BIOPSY;  Surgeon: Rolm Bookbinder, MD;  Location: [place];  Service: General;  Laterality: Left;  . Carpal tunnel release Right 08/06/2004  . Trigger finger release Right 08/06/2004    middle finger  . Portacath placement Right 10/21/2014    Procedure: INSERTION PORT-A-CATH;  Surgeon: Rolm Bookbinder, MD;  Location: [place];  Service: General;  Laterality: Right;  . Re-excision of breast lumpectomy Left 10/21/2014    Procedure: RE-EXCISION OF BREAST CANCER, POSTERIOR MARGINS;  Surgeon: Rolm Bookbinder, MD;  Location: [place];  Service: General;  Laterality: Left;     ALLERGIES:  Allergies  Allergen Reactions    . Macrobid [Nitrofurantoin Monohyd Macro] Hives and Itching    Chest pain  . Biaxin [Clarithromycin] Other (See Comments)    UNKNOWN     CURRENT MEDICATIONS:  Current Outpatient Prescriptions on File Prior to Visit  Medication Sig Dispense Refill  . aspirin 81 MG tablet Take 81 mg by mouth daily.    . Calcium Carbonate (CALCIUM 600 PO) Take 600 mg by mouth daily.     . cetirizine (ZYRTEC) 10 MG tablet Take 10 mg by mouth daily.    . Cholecalciferol (VITAMIN D) 1000 UNITS capsule Take 1,000 Units by mouth daily.    . DULoxetine (CYMBALTA) 60 MG capsule Take 60 mg by mouth daily.    . ferrous sulfate 325 (65 FE) MG tablet Take 325 mg by mouth daily with breakfast.    . LIVALO 4 MG TABS Take 4 mg by mouth daily.  0  . losartan (COZAAR) 25 MG tablet Take 1 tablet (25 mg total) by mouth daily. 25 tablet 11  . Magnesium 250 MG TABS Take 250 mg by mouth 2 (two) times daily.     . metFORMIN (GLUCOPHAGE) 1000 MG tablet Take 1,000 mg by mouth 2 (two) times daily with a meal.    .  NOVOLOG MIX 70/30 FLEXPEN (70-30) 100 UNIT/ML FlexPen Inject 32 Units into the skin 2 (two) times daily.   1  . Omega-3 Fatty Acids (FISH OIL PO) Take 1 tablet by mouth daily.     . pantoprazole (PROTONIX) 40 MG tablet Take 40 mg by mouth daily.     No current facility-administered medications on file prior to visit.     ONCOLOGIC FAMILY HISTORY:  Family History  Problem Relation Age of Onset  . Prostate cancer Father   . Stomach cancer Maternal Aunt   . Pancreatic cancer Maternal Uncle   . Lung cancer Maternal Grandmother   . Breast cancer Maternal Aunt   . Pancreatic cancer Maternal Aunt   . Pancreatic cancer Maternal Uncle    SOCIAL HISTORY:  Dominique Dillon is widowed and lives with her adoptive daughter in [place], [place].  She has 2 children. 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  She denies any current or history of tobacco,  alcohol, or illicit drug use.     PHYSICAL EXAMINATION:  Vital Signs: Filed Vitals:   08/27/15 0950  BP: 158/80  Pulse: 83  Temp: 98.4 F (36.9 C)  Resp: 18   ECOG performance status: 0 General: Well-nourished, well-appearing female in no acute distress.  She is  Unaccompanied in clinic today.   HEENT: Head is atraumatic and normocephalic.   Sclerae anicteric. Oral mucosa is pink, moist, and intact without lesions. .  Lymph: No cervical, supraclavicular, infraclavicular, or axillary lymphadenopathy noted on palpation.  Cardiovascular: Regular rate and rhythm without murmurs, rubs, or gallops. Respiratory: Clear to auscultation bilaterally. Chest expansion symmetric without accessory muscle use on inspiration or expiration.  Breast: Tenderness across lower inner quadrant of left breast diffusely with accompanying firmness secondary to edema.  Non erythematous. Cool to touch.  No discrete mass appreciated along area of tenderness or throughout remainder of breast. GI: Abdomen soft and round. No tenderness to palpation. GU: Deferred.    Neuro: No focal deficits. Steady gait.  Psych: Mood and affect normal and appropriate for situation.  Extremities: No edema, cyanosis, or clubbing.  Skin: Warm and dry. No open lesions noted.   LABORATORY DATA:  Recent Results (from the past 2160 hour(s))  Myocardial Perfusion Imaging     Status: None   Collection Time: 06/12/15  9:48 AM  Result Value Ref Range   Rest HR 72 bpm   Rest BP 138/93 mmHg   Exercise duration (min) 5 min   Exercise duration (sec) 40 sec   Estimated workload 7.0 METS   Peak HR 148 bpm   Peak BP 212/102 mmHg   MPHR 159 bpm   Percent HR 93 %   RPE 16    LV Systolic Volume 29 mL   TID 4.00    LV Diastolic Volume 79 mL   LHR     SSS 0    SRS 0    SDS 0       ASSESSMENT AND PLAN:   1. Breast cellulitis: I believe that Ms. Chavous's symptoms are consistent with cellulitis based on her clinical presentation.  During  her history, she reports that she did see some improvement following treatment with cephalexin for an urinary tract infection (3 days of therapy) with return of symptoms following completion of the dose.  I have elected therefore to place her back on cephalexin as it has been less than 72 hours since she completed her  course of antibiotics and will treat her for 10 days.  If it has not improved at that time, we will proceed with diagnostic imaging.  I will call to check on her next week and have asked that if her symptoms worsen, that she notify us ASAP and / or seek medical attention.   2. History of breast cancer: Stage IA invasive ductal carcinoma of the left/right breast, triple negative (ER/PR/ HER2neu) with accompanying DCIS, S/P lumpectomy followed by adjuvant docetaxel and cyclophosphamide x 4 cycles and adjuvant radiation to the left breast. She is due to see Dr. Jana Hakim next month for ongoing follow up and will be due mammography at that time.  If her clinical situation changes, we will expedite that appointment but at this time, will keep that current schedule for now.   A total of 20 minutes of face-to-face time was spent with this patient with greater than 50% of that time in counseling and care-coordination.   Sylvan Cheese, NP  Survivorship Program [facility] [phone]   Note: PRIMARY CARE PROVIDER [place] Reader [place], [place] [phone]

## 2015-08-27 NOTE — Patient Instructions (Signed)
START HCTZ (FLUID PILL) 12.5 MG  ONE TABLET DAILY THIS IS IN ADDITION TO ALL YOUR OTHER MEDICATION.  NO OTHER CHANGES.  TALK WITH PRIMARY  ABOUT  CONTINUING REFILL YOUR MEDICATIONS AFTER THIS VISIT.   Your physician recommends that you schedule a follow-up appointment AS NEEDED BASIS.

## 2015-08-27 NOTE — Progress Notes (Signed)
PATIENT: Dominique Dillon MRN: [id number] DOB: 04-25-1956 PCP: Holland Commons, FNP  Clinic Note: Chief Complaint  Patient presents with  . Follow-up    chest pain re-evaluation  . Hypertension    HPI: Dominique Dillon is a 60 y.o. female with a PMH below who presents today for 2nd Post- hospital f/u - to discuss Myoview Results. Has PMH of Breast CA s/p Chemo Rx (completed in ~Aug 2016), HTN, DM-2 & Gerd. She apparently saw me several years ago & had a stress test performed to evaluate chest pain -- her old chart is not available to since it is in long-term storage.  Admitted 10/10-06/2015 - Sudden onset Sharp L Anterior Chest pain -- ~ slightly abnormal EKG.  This began shortly after taking 1st dose of Macrobid for UTI.  --> Pressure/aching pain ~10/10 without radiation w/ diaphoresis & mild dyspnea.  Felt throat tighness. EMS --> noted to be hypotensive & "passed out" when EMS sat her up. No F/C.  Pain continued @ ~8/10.  BP in ED 88/50 mmHg --> better with IVF. Elevated D-dimer, but no PE on CTA Chest.  ? PNA --> given Levaquin  She was last seen on 05/28/15 - continued to do well.  Strange sensation in L upper chest & throat fullness with walking --> ordered Myoview (06/2015).    -- Study reviewed: Myoview 06/2015: Study Highlights     Nuclear stress EF: 63%.  The left ventricular ejection fraction is normal (55-65%).  Blood pressure demonstrated a hypertensive response to exercise.  There was no ST segment deviation noted during stress.  This is a low risk study.  Low risk stress nuclear study with a small, moderate intensity, reversible apical defect; cannot R/O very mild apical ischemia; remaining vascular territories with normal perfusion; EF 63 with normal wall motion.   Carotid US - 06/2015 - < 40% bilateral, normal vertebral artery flow.  Interval History: Dominique Dillon seems to be doing well since her last visit.  She has had no additional chest discomfort episodes.   She has some L sided chest discomfort, but it is superficial (potential related to prior Breast Cancer. No exertional CP or significant dyspnea, unless she "overdoes it". Last visit, we reduced her ARB does to 25 mg & HCTZ was already on hold. No further syncope/near syncope, but is noting mild "end-of-day" edema.  The remainder of Cardiovascular ROS is as follows:   positive for - dyspnea on significant  exertion and improved since completing XRT  negative for - chest pain, edema, irregular heartbeat, loss of consciousness, orthopnea, palpitations, paroxysmal nocturnal dyspnea, rapid heart rate, shortness of breath or TIA/Amaurosis fugax   Past Medical History  Diagnosis Date  . GERD (gastroesophageal reflux disease)   . Obesity   . Breast cancer of upper-outer quadrant of left female breast ([place]) 09/12/2014  . History of anemia     still takes iron supplement  . Arthritis     knees  . Hypertension     under control with med., has been on med. since 1990s  . Insulin dependent diabetes mellitus ([place])   . Seasonal asthma     no current med.  . DDD (degenerative disc disease), cervical     Prior Cardiac Evaluation and Past Surgical History: Past Surgical History  Procedure Laterality Date  . Cesarean section      x 2  . Knee arthroscopy Bilateral   . Abdominal hysterectomy      complete  . Cholecystectomy    .  Appendectomy    . Tonsillectomy    . Irrigation and debridement abscess N/A 03/08/2014    Procedure: IRRIGATION AND DEBRIDEMENT ABDOMINAL WALL ABSCESS;  Surgeon: Adin Hector, MD;  Location: [facility];  Service: General;  Laterality: N/A;  . Colonoscopy    . Upper gi endoscopy    . Radioactive seed guided mastectomy with axillary sentinel lymph node biopsy Left 10/01/2014    Procedure: RADIOACTIVE SEED GUIDED LEFT BREAST LUMPECTOMY WITH LEFT  AXILLARY SENTINEL LYMPH NODE BIOPSY;  Surgeon: Rolm Bookbinder, MD;  Location: [place];  Service: General;   Laterality: Left;  . Carpal tunnel release Right 08/06/2004  . Trigger finger release Right 08/06/2004    middle finger  . Portacath placement Right 10/21/2014    Procedure: INSERTION PORT-A-CATH;  Surgeon: Rolm Bookbinder, MD;  Location: [place];  Service: General;  Laterality: Right;  . Re-excision of breast lumpectomy Left 10/21/2014    Procedure: RE-EXCISION OF BREAST CANCER, POSTERIOR MARGINS;  Surgeon: Rolm Bookbinder, MD;  Location: [place];  Service: General;  Laterality: Left;  . Nm myoview ltd  06/2015    LOW RISK. HTN response to exercise. No EKG changes. Small, partially reversible apical defect (probably Breast Attenuation, but CRO apical Ischemia).  EF 63% with no RWMA    Allergies  Allergen Reactions  . Macrobid [Nitrofurantoin Monohyd Macro] Hives and Itching    Chest pain  . Biaxin [Clarithromycin] Other (See Comments)    UNKNOWN   Prior to Admission medications   Medication Sig Start Date End Date Taking? Authorizing Provider  aspirin 81 MG tablet Take 81 mg by mouth daily.   Yes Historical Provider, MD  Calcium Carbonate (CALCIUM 600 PO) Take 600 mg by mouth daily.    Yes Historical Provider, MD  cephALEXin (KEFLEX) 500 MG capsule Take 1 capsule by mouth 4 (four) times daily. 08/22/15  Yes Historical Provider, MD  cetirizine (ZYRTEC) 10 MG tablet Take 10 mg by mouth daily.   Yes Historical Provider, MD  Cholecalciferol (VITAMIN D) 1000 UNITS capsule Take 1,000 Units by mouth daily.   Yes Historical Provider, MD  DULoxetine (CYMBALTA) 60 MG capsule Take 60 mg by mouth daily.   Yes Historical Provider, MD  ferrous sulfate 325 (65 FE) MG tablet Take 325 mg by mouth daily with breakfast.   Yes Historical Provider, MD  LIVALO 4 MG TABS Take 4 mg by mouth daily. 02/11/15  Yes Historical Provider, MD  losartan (COZAAR) 25 MG tablet Take 1 tablet (25 mg total) by mouth daily. 05/28/15  Yes Leonie Man, MD  Magnesium 250 MG TABS Take 250  mg by mouth 2 (two) times daily.    Yes Historical Provider, MD  metFORMIN (GLUCOPHAGE) 1000 MG tablet Take 1,000 mg by mouth 2 (two) times daily with a meal.   Yes Historical Provider, MD  NOVOLOG MIX 70/30 FLEXPEN (70-30) 100 UNIT/ML FlexPen Inject 32 Units into the skin 2 (two) times daily.  04/30/15  Yes Historical Provider, MD  Omega-3 Fatty Acids (FISH OIL PO) Take 1 tablet by mouth daily.    Yes Historical Provider, MD  pantoprazole (PROTONIX) 40 MG tablet Take 40 mg by mouth daily.   Yes Historical Provider, MD    Social History  Substance Use Topics  . Smoking status: Never Smoker   . Smokeless tobacco: Never Used  . Alcohol Use: No    family history includes Breast cancer in her maternal aunt; Lung cancer in her maternal  grandmother; Pancreatic cancer in her maternal aunt, maternal uncle, and maternal uncle; Prostate cancer in her father; Stomach cancer in her maternal aunt.  ROS: A comprehensive Review of Systems - was performed; if not noted in history of present illness -- Review of Systems  Constitutional: Negative for malaise/fatigue.  HENT: Negative for nosebleeds.   Respiratory: Positive for cough (Occasional).   Cardiovascular: Claudication: Leg pain with walking. Not clear if this is true claudication.  Gastrointestinal: Positive for heartburn (Intermittent). Negative for blood in stool and melena.  Genitourinary: Negative for hematuria.  Neurological: Negative for weakness and headaches.  Endo/Heme/Allergies: Does not bruise/bleed easily.  Psychiatric/Behavioral: The patient is not nervous/anxious.   All other systems reviewed and are negative.   PHYSICAL EXAM BP 140/80 mmHg  Pulse 84  Ht 5\' 3"  (1.6 m)  Wt 209 lb (94.802 kg)  BMI 37.03 kg/m2 General appearance: alert, cooperative, appears stated age, no distress, moderately obese and Well-nourished and well-groomed Neck: no adenopathy, no carotid bruit, no JVD and supple, symmetrical, trachea midline Lungs:  CTAB normal percussion bilaterally and Nonlabored, good air movement Heart: regular rate and rhythm, S1& S2 normal, no murmur, click, rub or gallop and normal apical impulse; chest wall tenderness along margin of L Breast. Abdomen: soft, non-tender; bowel sounds normal; no masses,  no organomegaly Extremities: extremities normal, atraumatic, no cyanosis or edema Pulses: 2+ and symmetric Skin: Skin color, texture, turgor normal. No rashes or lesions Neurologic: Alert and oriented X 3, normal strength and tone. Normal symmetric reflexes. Normal coordination and gait Cranial nerves: normal   Adult ECG Report - no new EKG  Recent Labs:   05/19/2015: TC 209, TG 179, HDL 48, LDL 125.;; HgbA1c 7.4  ASSESSMENT / PLAN:  Problem List Items Addressed This Visit    Obesity, Class II, BMI 35-39.9 (Chronic)    Metabolic Syndrome --> With Myoview results, encouraged increased exercise.  Also discussed dietary modification.      Metabolic syndrome (Chronic)    Obesity, HTN & DM --> Aggressive Risk Factor control. Has been well managed by PCP, will defer to PCP & see on a PRN basis pending any recurrent SSx worrisome for angina.       Essential hypertension, benign    BP back up to pre-morbid level --> restart HCTZ. Defer to PCP on f/u to determine if she should return to 50 mg ARB.      Relevant Medications   losartan (COZAAR) 25 MG tablet   hydrochlorothiazide (MICROZIDE) 12.5 MG capsule   Dyslipidemia, goal LDL below 100 (Chronic)    On Livalo.  Followed by PCP.      Relevant Medications   losartan (COZAAR) 25 MG tablet   hydrochlorothiazide (MICROZIDE) 12.5 MG capsule   Chest pain with moderate risk for cardiac etiology - Primary    Dali presents for reevaluation for cardiology standpoint: she was evaluated with a Myoview ST that was read as LOW RISK (albiet with ? Apical ischemia).  She has not had any further Sx concerning for exertional angina.  No further syncope/neasr-syncope  & BP is up.  As she is not having Sx of Angina, will continue Medical Management, but if she has concerning Sx in the future, would have a low threshold for proceeding to cardiac catheterization.  For now, since she is relatively asymptomatic, she would prefer to take a "watchful waiting" approach. Consider BB if additional BP control is required above ARB/HCTZ  CRF control - she is on Livalo.  Close glycemic control  Meds ordered this encounter  Medications  . cephALEXin (KEFLEX) 500 MG capsule    Sig: Take 1 capsule by mouth 4 (four) times daily.    Refill:  0  . losartan (COZAAR) 25 MG tablet    Sig: Take 1 tablet (25 mg total) by mouth daily.    Dispense:  30 tablet    Refill:  11  . hydrochlorothiazide (MICROZIDE) 12.5 MG capsule    Sig: Take 1 capsule (12.5 mg total) by mouth daily.    Dispense:  30 capsule    Refill:  11   PATIENT INSTRUCTIONS: Restart HCTZ-  Discuss with PCP re: titration of Losartan back to 50 mg.   Followup: PRN  Leonie Man, M.D., M.S. Interventional Cardiologist   Pager # [phone]

## 2015-08-27 NOTE — Telephone Encounter (Signed)
Called and spoke with pharmacist regarding recent Rx.  Confirmed that patient received 3 day course of cephalexin; gave new orders for cephalexin 500 mg #40 with instructions to take one tablet by mouth every six hours x 10 days; no refills.  Called and spoke with patient to let her know, as well.

## 2015-08-27 NOTE — Telephone Encounter (Signed)
Patient called with new complaints of left breast pain that has been going on over last several weeks / months.  Offered appointment to come in for evaluation - patient coming this morning at 9:45 a.m. for evaluation.

## 2015-08-29 ENCOUNTER — Ambulatory Visit: Payer: BC Managed Care – PPO | Admitting: Cardiology

## 2015-08-29 ENCOUNTER — Encounter: Payer: Self-pay | Admitting: Cardiology

## 2015-08-29 DIAGNOSIS — E8881 Metabolic syndrome: Secondary | ICD-10-CM | POA: Insufficient documentation

## 2015-08-29 DIAGNOSIS — E785 Hyperlipidemia, unspecified: Secondary | ICD-10-CM | POA: Insufficient documentation

## 2015-08-29 NOTE — Assessment & Plan Note (Signed)
Metabolic Syndrome --> With Myoview results, encouraged increased exercise.  Also discussed dietary modification.

## 2015-08-29 NOTE — Assessment & Plan Note (Signed)
BP back up to pre-morbid level --> restart HCTZ. Defer to PCP on f/u to determine if she should return to 50 mg ARB.

## 2015-08-29 NOTE — Assessment & Plan Note (Signed)
On Livalo.  Followed by PCP.

## 2015-08-29 NOTE — Assessment & Plan Note (Signed)
Obesity, HTN & DM --> Aggressive Risk Factor control. Has been well managed by PCP, will defer to PCP & see on a PRN basis pending any recurrent SSx worrisome for angina.

## 2015-08-29 NOTE — Assessment & Plan Note (Signed)
Dominique Dillon presents for reevaluation for cardiology standpoint: she was evaluated with a Myoview ST that was read as LOW RISK (albiet with ? Apical ischemia).  She has not had any further Sx concerning for exertional angina.  No further syncope/neasr-syncope & BP is up.  As she is not having Sx of Angina, will continue Medical Management, but if she has concerning Sx in the future, would have a low threshold for proceeding to cardiac catheterization.  For now, since she is relatively asymptomatic, she would prefer to take a "watchful waiting" approach. Consider BB if additional BP control is required above ARB/HCTZ  CRF control - she is on Livalo.  Close glycemic control

## 2015-09-03 ENCOUNTER — Telehealth: Payer: Self-pay | Admitting: Nurse Practitioner

## 2015-09-03 NOTE — Telephone Encounter (Signed)
Called and spoke with patient regarding symptoms.  Symptoms improving on antibiotics and patient reports area is decreasing in size.  Will continue antibiotics throughout the end of the week (10 day course) and report if symptoms do not resolve completely.  Pt due for mammogram but advised to wait at least a week following completion of antibiotics before having study done to allow for complete resolution.  Pt without questions.

## 2015-09-08 ENCOUNTER — Telehealth: Payer: Self-pay | Admitting: Nurse Practitioner

## 2015-09-08 NOTE — Telephone Encounter (Signed)
Received call from patient.  Breast improved but still with some tenderness and swelling.  Pt to call Dr. Cristal Generous office today to make appointment for evaluation as discussed at prior visit.  This provider will also send note to Dr. Donne Hazel and his nurse about patient.  Pt advised to keep [place] posted.  Pt also with question about is it okay to see her dentist at this time.  Pt instructed that seeing her dentist would be fine; no contraindications. Pt without further questions.

## 2015-09-09 ENCOUNTER — Other Ambulatory Visit: Payer: Self-pay | Admitting: General Surgery

## 2015-09-09 DIAGNOSIS — N644 Mastodynia: Secondary | ICD-10-CM

## 2015-09-09 DIAGNOSIS — R52 Pain, unspecified: Secondary | ICD-10-CM

## 2015-09-11 ENCOUNTER — Ambulatory Visit
Admission: RE | Admit: 2015-09-11 | Discharge: 2015-09-11 | Disposition: A | Discharge status: Home / Self Care | Payer: BC Managed Care – PPO | Source: Ambulatory Visit | Attending: General Surgery | Admitting: General Surgery

## 2015-09-11 DIAGNOSIS — N644 Mastodynia: Secondary | ICD-10-CM

## 2015-09-11 DIAGNOSIS — R52 Pain, unspecified: Secondary | ICD-10-CM

## 2015-09-12 ENCOUNTER — Other Ambulatory Visit: Payer: Self-pay | Admitting: General Surgery

## 2015-09-12 DIAGNOSIS — N63 Unspecified lump in unspecified breast: Secondary | ICD-10-CM

## 2015-09-26 ENCOUNTER — Ambulatory Visit
Admission: RE | Admit: 2015-09-26 | Discharge: 2015-09-26 | Disposition: A | Discharge status: Home / Self Care | Payer: BC Managed Care – PPO | Source: Ambulatory Visit | Attending: General Surgery | Admitting: General Surgery

## 2015-09-26 DIAGNOSIS — N63 Unspecified lump in unspecified breast: Secondary | ICD-10-CM

## 2015-09-26 HISTORY — PX: BREAST BIOPSY: SHX20

## 2015-09-30 ENCOUNTER — Other Ambulatory Visit (HOSPITAL_BASED_OUTPATIENT_CLINIC_OR_DEPARTMENT_OTHER): Payer: BC Managed Care – PPO

## 2015-09-30 DIAGNOSIS — C50412 Malignant neoplasm of upper-outer quadrant of left female breast: Secondary | ICD-10-CM

## 2015-09-30 LAB — CBC WITH DIFFERENTIAL/PLATELET
BASO%: 0.5 % (ref 0.0–2.0)
Basophils Absolute: 0 10*3/uL (ref 0.0–0.1)
EOS%: 3.4 % (ref 0.0–7.0)
Eosinophils Absolute: 0.2 10*3/uL (ref 0.0–0.5)
HEMATOCRIT: 36.7 % (ref 34.8–46.6)
HEMOGLOBIN: 11.7 g/dL (ref 11.6–15.9)
LYMPH#: 1.4 10*3/uL (ref 0.9–3.3)
LYMPH%: 25.2 % (ref 14.0–49.7)
MCH: 23.5 pg — ABNORMAL LOW (ref 25.1–34.0)
MCHC: 32 g/dL (ref 31.5–36.0)
MCV: 73.6 fL — ABNORMAL LOW (ref 79.5–101.0)
MONO#: 0.5 10*3/uL (ref 0.1–0.9)
MONO%: 8.8 % (ref 0.0–14.0)
NEUT#: 3.4 10*3/uL (ref 1.5–6.5)
NEUT%: 62.1 % (ref 38.4–76.8)
Platelets: 290 10*3/uL (ref 145–400)
RBC: 4.99 10*6/uL (ref 3.70–5.45)
RDW: 15.1 % — AB (ref 11.2–14.5)
WBC: 5.5 10*3/uL (ref 3.9–10.3)

## 2015-09-30 LAB — COMPREHENSIVE METABOLIC PANEL
ALBUMIN: 3.8 g/dL (ref 3.5–5.0)
ALK PHOS: 102 U/L (ref 40–150)
ALT: 13 U/L (ref 0–55)
AST: 21 U/L (ref 5–34)
Anion Gap: 10 mEq/L (ref 3–11)
BUN: 14.3 mg/dL (ref 7.0–26.0)
CALCIUM: 9.7 mg/dL (ref 8.4–10.4)
CHLORIDE: 104 meq/L (ref 98–109)
CO2: 27 mEq/L (ref 22–29)
CREATININE: 0.8 mg/dL (ref 0.6–1.1)
EGFR: 89 mL/min/{1.73_m2} — ABNORMAL LOW (ref >90)
Glucose: 108 mg/dl (ref 70–140)
Potassium: 4.4 mEq/L (ref 3.5–5.1)
Sodium: 141 mEq/L (ref 136–145)
Total Bilirubin: 0.37 mg/dL (ref 0.20–1.20)
Total Protein: 7.5 g/dL (ref 6.4–8.3)

## 2015-10-03 ENCOUNTER — Other Ambulatory Visit: Payer: Self-pay | Admitting: General Surgery

## 2015-10-03 DIAGNOSIS — C50412 Malignant neoplasm of upper-outer quadrant of left female breast: Secondary | ICD-10-CM

## 2015-10-06 ENCOUNTER — Ambulatory Visit
Admission: RE | Admit: 2015-10-06 | Discharge: 2015-10-06 | Disposition: A | Discharge status: Home / Self Care | Payer: BC Managed Care – PPO | Source: Ambulatory Visit | Attending: General Surgery | Admitting: General Surgery

## 2015-10-06 DIAGNOSIS — C50412 Malignant neoplasm of upper-outer quadrant of left female breast: Secondary | ICD-10-CM

## 2015-10-06 MED ORDER — GADOBENATE DIMEGLUMINE 529 MG/ML IV SOLN: 20.0000 mL | Freq: Once | INTRAVENOUS | Status: DC | PRN

## 2015-10-07 ENCOUNTER — Telehealth: Payer: Self-pay | Admitting: Oncology

## 2015-10-07 ENCOUNTER — Ambulatory Visit (HOSPITAL_BASED_OUTPATIENT_CLINIC_OR_DEPARTMENT_OTHER): Payer: BC Managed Care – PPO | Admitting: Oncology

## 2015-10-07 VITALS — BP 145/90 | HR 86 | Temp 98.9°F | Resp 18 | Ht 63.0 in | Wt 209.0 lb

## 2015-10-07 DIAGNOSIS — E119 Type 2 diabetes mellitus without complications: Secondary | ICD-10-CM

## 2015-10-07 DIAGNOSIS — N649 Disorder of breast, unspecified: Secondary | ICD-10-CM

## 2015-10-07 DIAGNOSIS — G629 Polyneuropathy, unspecified: Secondary | ICD-10-CM

## 2015-10-07 DIAGNOSIS — C50919 Malignant neoplasm of unspecified site of unspecified female breast: Secondary | ICD-10-CM

## 2015-10-07 DIAGNOSIS — C50412 Malignant neoplasm of upper-outer quadrant of left female breast: Secondary | ICD-10-CM | POA: Diagnosis not present

## 2015-10-07 NOTE — Progress Notes (Signed)
[place]  [phone] [phone]     ID: Dominique Dillon DOB: 1955-11-03  MR#: [id number]  JOI#:[id number]  Patient Care Team: Holland Commons, FNP as PCP - General (Internal Medicine) Deland Pretty, MD as Referring Physician (Internal Medicine) Newton Pigg, MD as Consulting Physician (Obstetrics and Gynecology) Rolm Bookbinder, MD as Consulting Physician (General Surgery) Chauncey Cruel, MD as Consulting Physician (Oncology) Eppie Gibson, MD as Consulting Physician (Radiation Oncology) Rockwell Germany, RN as Registered Nurse Mauro Kaufmann, RN as Registered Nurse Holley Bouche, NP as Nurse Practitioner (Nurse Practitioner) Sylvan Cheese, NP as Nurse Practitioner (Hematology and Oncology) OTHER MD:  CHIEF COMPLAINT: Triple negative breast cancer  CURRENT TREATMENT: observation  BREAST CANCER HISTORY: From the original intake note:  Dominique Dillon had bilateral screening mammography at [facility] 08/15/2014 showing a possible mass in the left breast. Left diagnostic mamm[facility] showed the breast density to be category B. There was a persistent spiculated mass in the upper outer quadrant of the left breast, which by ultrasonography measured 0.74 cm. Ultrasound of the left axilla was negative. Biopsy of the left breast mass in question 09/09/2014 showed ([id number]) and invasive ductal carcinoma, grade 2 or 3, E-cadherin positive, estrogen receptor 11% positive with weak staining intensity, progesterone receptor negative, HER-2 negative, with an MIB-1 of 35%.  The patient's subsequent history is as detailed below  INTERVAL HISTORY: Dominique Dillon returns today for follow-up of her early stage, triple negative breast cancer. After completing radiation she presented to the survivorship nurse practitioner with a complaint of left breast tenderness. She was treated for cellulitis with  cephalexin for 10 days without complete resolution. Her surgeon was alerted and he ordered an ultrasound of the left breast and bilateral mammography with tomosynthesis. Bilateral diagnostic mammography at [facility] 09/11/2015 found a breast density to be category B. In addition to the expected changes from the recent radiation therapy there were new calcifications medial and inferior to the lumpectomy site. This was consistent with fat necrosis. There were no dominant masses. In the subareolar left breast there was an area of architectural distortion slightly above the nipple. Calcifications in this area are seen only on tomosynthesis. This also was felt to be possibly secondary to fat necrosis. On ultrasound there was skin thickening up to 7 mm at the site of the palpable abnormality at the 9:00 axis of the left breast. Ultrasound of the subareolar left breast found a vague hypoechoic area measuring 2.4 cm. This was suggestive of scarring.  The suspicious subareolar area was biopsied [place] 17 2017, and this showed ([id number]) fibrocystic changes associated with microcalcifications. There was no atypia or malignancy. This was felt to be discordant. Accordingly a breast MRI was performed yesterday. This showed an area of focal non-masslike enhancement in the superior left breast at the 1:00 axis medial to the known seroma in the upper outer quadrant, measuring 1.5 cm. There was also extensive non-masslike enhancement in the subareolar left breast measuring 3.1 cm. There was no evidence of metastatic lymphadenopathy and the right breast was unremarkable. MRI guided biopsy was suggested.  REVIEW OF SYSTEMS: Dominique Dillon still has some neuropathy involving the toes would at the bottom", but this is considerably milder than several months ago. She is short of breath when she walks up stairs. She has night sweats. She sleeps on 2 pillows. She has aches and pains here and there which are not more persistent  or intense  than prior. She has hot flashes. She tells me her diabetes is well-controlled. She denies fever, rash, or bleeding. A detailed review of systems today was otherwise stable  PAST MEDICAL HISTORY: Past Medical History  Diagnosis Date  . GERD (gastroesophageal reflux disease)   . Obesity   . Breast cancer of upper-outer quadrant of left female breast ([place]) 09/12/2014  . History of anemia     still takes iron supplement  . Arthritis     knees  . Hypertension     under control with med., has been on med. since 1990s  . Insulin dependent diabetes mellitus ([place])   . Seasonal asthma     no current med.  . DDD (degenerative disc disease), cervical     PAST SURGICAL HISTORY: Past Surgical History  Procedure Laterality Date  . Cesarean section      x 2  . Knee arthroscopy Bilateral   . Abdominal hysterectomy      complete  . Cholecystectomy    . Appendectomy    . Tonsillectomy    . Irrigation and debridement abscess N/A 03/08/2014    Procedure: IRRIGATION AND DEBRIDEMENT ABDOMINAL WALL ABSCESS;  Surgeon: Adin Hector, MD;  Location: [facility];  Service: General;  Laterality: N/A;  . Colonoscopy    . Upper gi endoscopy    . Radioactive seed guided mastectomy with axillary sentinel lymph node biopsy Left 10/01/2014    Procedure: RADIOACTIVE SEED GUIDED LEFT BREAST LUMPECTOMY WITH LEFT  AXILLARY SENTINEL LYMPH NODE BIOPSY;  Surgeon: Rolm Bookbinder, MD;  Location: [place];  Service: General;  Laterality: Left;  . Carpal tunnel release Right 08/06/2004  . Trigger finger release Right 08/06/2004    middle finger  . Portacath placement Right 10/21/2014    Procedure: INSERTION PORT-A-CATH;  Surgeon: Rolm Bookbinder, MD;  Location: [place];  Service: General;  Laterality: Right;  . Re-excision of breast lumpectomy Left 10/21/2014    Procedure: RE-EXCISION OF BREAST CANCER, POSTERIOR MARGINS;  Surgeon: Rolm Bookbinder, MD;  Location: [place];  Service: General;  Laterality: Left;  . Nm myoview ltd  06/2015    LOW RISK. HTN response to exercise. No EKG changes. Small, partially reversible apical defect (probably Breast Attenuation, but CRO apical Ischemia).  EF 63% with no RWMA    FAMILY HISTORY Family History  Problem Relation Age of Onset  . Prostate cancer Father   . Stomach cancer Maternal Aunt   . Pancreatic cancer Maternal Uncle   . Lung cancer Maternal Grandmother   . Breast cancer Maternal Aunt   . Pancreatic cancer Maternal Aunt   . Pancreatic cancer Maternal Uncle    there is significant stomach (one case) and pancreatic cancer (3 cases) on the maternal side. There is also a history of breast cancer on the maternal side(1 and diagnosed at age 89). The patient's father died the age of 36 with prostate cancer  GYNECOLOGIC HISTORY:  No LMP recorded. Patient has had a hysterectomy. Menarche age 89, first live birth age 44, the patient is G X P2. She underwent hysterectomy with salpingo-oophorectomy in 1995. She did not take hormone replacement.  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 She is widowed. At home she lives with her adopted daughter Kaisa Wofford. The patient's 2 biologic daughter are Mikala Podoll who works as a Recruitment consultant and Maizee Reinhold who works as a Land, both in [place]. The patient also  has a Film/video editor, [name], whom she help raise and whom she considers as a daughter    ADVANCED DIRECTIVES: Not in place. At her to 05/29/2015 visit the patient was given the appropriate documents 2 complete and notarize at her discretion. She tells me she intends to name her daughter Dominique Dillon as her healthcare power of attorney. 68 can be reached at [place]: Social History  Substance Use Topics  . Smoking status: Never Smoker   . Smokeless tobacco: Never Used  . Alcohol Use: No     Colonoscopy: [place], 2011  PAP:  Bone  density: Remote  Lipid panel:  Allergies  Allergen Reactions  . Macrobid [Nitrofurantoin Monohyd Macro] Hives and Itching    Chest pain  . Biaxin [Clarithromycin] Other (See Comments)    UNKNOWN    Current Outpatient Prescriptions  Medication Sig Dispense Refill  . aspirin 81 MG tablet Take 81 mg by mouth daily.    . Calcium Carbonate (CALCIUM 600 PO) Take 600 mg by mouth daily.     . cephALEXin (KEFLEX) 500 MG capsule Take 1 capsule by mouth 4 (four) times daily.  0  . cetirizine (ZYRTEC) 10 MG tablet Take 10 mg by mouth daily.    . Cholecalciferol (VITAMIN D) 1000 UNITS capsule Take 1,000 Units by mouth daily.    . DULoxetine (CYMBALTA) 60 MG capsule Take 60 mg by mouth daily.    . ferrous sulfate 325 (65 FE) MG tablet Take 325 mg by mouth daily with breakfast.    . hydrochlorothiazide (MICROZIDE) 12.5 MG capsule Take 1 capsule (12.5 mg total) by mouth daily. 30 capsule 11  . LIVALO 4 MG TABS Take 4 mg by mouth daily.  0  . losartan (COZAAR) 25 MG tablet Take 1 tablet (25 mg total) by mouth daily. 30 tablet 11  . Magnesium 250 MG TABS Take 250 mg by mouth 2 (two) times daily.     . metFORMIN (GLUCOPHAGE) 1000 MG tablet Take 1,000 mg by mouth 2 (two) times daily with a meal.    . NOVOLOG MIX 70/30 FLEXPEN (70-30) 100 UNIT/ML FlexPen Inject 32 Units into the skin 2 (two) times daily.   1  . Omega-3 Fatty Acids (FISH OIL PO) Take 1 tablet by mouth daily.     . pantoprazole (PROTONIX) 40 MG tablet Take 40 mg by mouth daily.     No current facility-administered medications for this visit.    OBJECTIVE: Middle-aged African-American woman who appears well Filed Vitals:   10/07/15 1105  BP: 145/90  Dillon: 86  Temp: 98.9 F (37.2 C)  Resp: 18     Body mass index is 37.03 kg/(m^2).    ECOG FS:0 - Asymptomatic  Sclerae unicteric, pupils round and equal Oropharynx clear and moist-- no thrush or other lesions No cervical or supraclavicular adenopathy Lungs no rales or rhonchi Heart  regular rate and rhythm Abd soft, nontender, positive bowel sounds MSK no focal spinal tenderness, no upper extremity lymphedema Neuro: nonfocal, well oriented, appropriate affect Breasts: The right breast is unremarkable. The left breast is currently receiving radiation. There is minimal skin edema. There is no significant hyperpigmentation at this point. The left axilla is benign   LAB RESULTS:  CMP     Component Value Date/Time   NA 141 09/30/2015 0914   NA 137 05/20/2015 0029   K 4.4 09/30/2015 0914   K 3.2* 05/20/2015 0029   CL 100* 05/20/2015 0029   CO2 27 09/30/2015 0914  CO2 24 05/20/2015 0029   GLUCOSE 108 09/30/2015 0914   GLUCOSE 169* 05/20/2015 0029   BUN 14.3 09/30/2015 0914   BUN 15 05/20/2015 0029   CREATININE 0.8 09/30/2015 0914   CREATININE 0.96 05/20/2015 0945   CALCIUM 9.7 09/30/2015 0914   CALCIUM 9.3 05/20/2015 0029   PROT 7.5 09/30/2015 0914   PROT 7.1 03/08/2014 0500   ALBUMIN 3.8 09/30/2015 0914   ALBUMIN 3.1* 03/08/2014 0500   AST 21 09/30/2015 0914   AST 12 03/08/2014 0500   ALT 13 09/30/2015 0914   ALT 8 03/08/2014 0500   ALKPHOS 102 09/30/2015 0914   ALKPHOS 90 03/08/2014 0500   BILITOT 0.37 09/30/2015 0914   BILITOT 0.6 03/08/2014 0500   GFRNONAA >60 05/20/2015 0945   GFRAA >60 05/20/2015 0945    INo results found for: SPEP, UPEP  Lab Results  Component Value Date   WBC 5.5 09/30/2015   NEUTROABS 3.4 09/30/2015   HGB 11.7 09/30/2015   HCT 36.7 09/30/2015   MCV 73.6* 09/30/2015   PLT 290 09/30/2015      Chemistry      Component Value Date/Time   NA 141 09/30/2015 0914   NA 137 05/20/2015 0029   K 4.4 09/30/2015 0914   K 3.2* 05/20/2015 0029   CL 100* 05/20/2015 0029   CO2 27 09/30/2015 0914   CO2 24 05/20/2015 0029   BUN 14.3 09/30/2015 0914   BUN 15 05/20/2015 0029   CREATININE 0.8 09/30/2015 0914   CREATININE 0.96 05/20/2015 0945      Component Value Date/Time   CALCIUM 9.7 09/30/2015 0914   CALCIUM 9.3 05/20/2015  0029   ALKPHOS 102 09/30/2015 0914   ALKPHOS 90 03/08/2014 0500   AST 21 09/30/2015 0914   AST 12 03/08/2014 0500   ALT 13 09/30/2015 0914   ALT 8 03/08/2014 0500   BILITOT 0.37 09/30/2015 0914   BILITOT 0.6 03/08/2014 0500       No results found for: LABCA2  No components found for: [id number]  No results for input(s): INR in the last 168 hours.  Urinalysis    Component Value Date/Time   COLORURINE YELLOW 05/20/2015 0755   APPEARANCEUR CLEAR 05/20/2015 0755   LABSPEC 1.034* 05/20/2015 0755   PHURINE 5.0 05/20/2015 0755   GLUCOSEU NEGATIVE 05/20/2015 0755   HGBUR NEGATIVE 05/20/2015 0755   BILIRUBINUR NEGATIVE 05/20/2015 0755   [place] 05/20/2015 0755   PROTEINUR NEGATIVE 05/20/2015 0755   UROBILINOGEN 0.2 05/20/2015 0755   NITRITE NEGATIVE 05/20/2015 0755   LEUKOCYTESUR NEGATIVE 05/20/2015 0755    STUDIES: Mr Breast Bilateral W Wo Contrast  10/06/2015  CLINICAL DATA:  History of left breast cancer in 2016 status post lumpectomy and radiation therapy. Diagnostic mammogram of 09/11/2015 showed new architectural distortion within the subareolar left breast, slightly above the level of the nipple, 1 o'clock axis region, without definite sonographic correlate. Patient then underwent stereotactic-guided biopsy of this area of distortion revealing fibrocystic changes and prominent hyalinized fibrous tissue suggestive of scar formation. The benign pathology result was felt to be discordant as this subareolar distortion is a large distance from the patient's previous lumpectomy site in the upper-outer quadrant. Surgical excision has been recommended for this subareolar architectural distortion. Diagnostic mammogram of 09/11/2015 also showed new calcifications in the upper-outer quadrant of the left breast, slightly medial to the lumpectomy site. Biopsy has also been recommended for these calcifications. LABS:  Not applicable EXAM: BILATERAL BREAST MRI WITH AND WITHOUT CONTRAST  TECHNIQUE: Multiplanar,  multisequence MR images of both breasts were obtained prior to and following the intravenous administration of 20 ml of MultiHance. THREE-DIMENSIONAL MR IMAGE RENDERING ON INDEPENDENT WORKSTATION: Three-dimensional MR images were rendered by post-processing of the original MR data on an independent workstation. The three-dimensional MR images were interpreted, and findings are reported in the following complete MRI report for this study. Three dimensional images were evaluated at the independent DynaCad workstation COMPARISON:  Previous exam(s). FINDINGS: Breast composition: b. Scattered fibroglandular tissue. Background parenchymal enhancement: Mild Right breast: No enhancing mass, non-mass enhancement or secondary signs of malignancy. Left breast: Postsurgical seroma is seen within the upper-outer quadrant, with associated surgical clip artifact, corresponding to the previous lumpectomy site. There is focal non-mass enhancement within the superior left breast, 12-1 o'clock axis, at posterior depth, located just medial to the lumpectomy site/seroma, measuring 1.5 x 0.9 x 1 cm (AP by transverse by craniocaudal dimensions), demonstrating mixed kinetics which are predominantly plateau and progressive (series 7, images 62-68). Within this non-mass enhancement, there are scattered small foci of susceptibility artifact which are likely related to the calcifications seen in this area on earlier diagnostic mammogram. There is additional enhancement within the subareolar left breast, measuring 3.1 x 1.8 cm, demonstrating predominantly persistent enhancement kinetics, with associated post biopsy change centrally, with overlying skin thickening and skin retraction (series 7, image 90). Additional diffuse left breast skin thickening likely related to the recent radiation therapy. Lymph nodes: No enlarged or morphologically abnormal lymph nodes identified within the bilateral axillary or internal mammary  chain regions. Ancillary findings:  None. IMPRESSION: 1. Suspicious focal non-mass enhancement within the superior LEFT breast, 12-1 o'clock axis region, at posterior depth, located just medial to the lumpectomy site/seroma in the upper-outer quadrant, measuring 1.5 x 0.9 x 1 cm, suspected to correlate with the area of calcifications seen on earlier diagnostic mammogram. As biopsy has been recommended for the left breast calcifications, I recommend MRI-guided biopsy of this suspicious non-mass enhancement with postprocedure mammogram to confirm correspondence with the calcifications seen on mammogram. 2. Extensive non-mass enhancement within the subareolar LEFT breast, measuring 3.1 x 1.8 cm, corresponding to the prominent architectural distortion seen on the earlier diagnostic mammogram. Stereotactic biopsy of this architectural distortion revealed a benign pathology result which was deemed discordant and surgical excision recommended (as there is no documented history of any surgical procedure within this region of the subareolar left breast). 3. No evidence of malignancy within the RIGHT breast. 4. No MRI evidence of metastatic lymphadenopathy. RECOMMENDATION: 1. MRI-guided biopsy for the suspicious focal non-mass enhancement within the superior LEFT breast, 12-1 o'clock axis region, at posterior depth, located just medial to the lumpectomy site/seroma in the upper-outer quadrant, measuring 1.5 x 0.9 x 1 cm (series 7, images 62 through 68). 2. Postprocedure mammogram to ensure correspondence with the calcifications seen in this area on recent diagnostic mammogram. Stereotactic-guided biopsy has been recommended for these calcifications which should be subsequently performed if the clip placed at MRI-guided biopsy does not correspond to these calcifications (and/or calcifications are not seen within the specimen at pathology review). 3. Surgical excision has been recommended for the prominent distortion within the  subareolar LEFT breast (as there is no documented history of any surgical procedure within the subareolar left breast). BI-RADS CATEGORY  4: Suspicious. Electronically Signed   By: Franki Cabot M.D.   On: 10/06/2015 15:54   US Breast Ltd Uni Left Inc Axilla  09/12/2015  CLINICAL DATA:  History of left breast cancer in 2016  status post lumpectomy and radiation therapy. Patient describes recent palpable abnormality within the inner left breast, improved status post 10 day course of antibiotics. EXAM: DIGITAL DIAGNOSTIC BILATERAL MAMMOGRAM WITH 3D TOMOSYNTHESIS WITH CAD ULTRASOUND LEFT BREAST COMPARISON:  Previous exam(s). ACR Breast Density Category b: There are scattered areas of fibroglandular density. FINDINGS: There are expected postsurgical changes within the outer left breast. There is diffuse skin thickening and trabecular thickening, consistent with expected changes status post radiation therapy. There are new calcifications located just medial and inferior to the lumpectomy site. On magnification views, these appear predominantly coarse with associated lucent centers suggesting fat necrosis. No dominant masses or suspicious calcifications within the inner left breast, corresponding to the area of clinical concern with overlying skin marker in place. There is architectural distortion within the subareolar left breast, slightly above the nipple, 1 o'clock axis region. Grouped calcifications are seen within this area of distortion, at the upper margin, only able to be seen on tomosynthesis images, best seen on tomosynthesis CC slice 51. This distortion persists on additional spot compression views, however, there are some lucent areas within the distortion reminiscent of early developing fat necrosis. Mammographic images were processed with CAD. On physical exam, there is soft tissue thickening within the inner left breast without evidence of circumscribed mass. No erythema seen. There is also skin thickening  just above the left nipple. No postsurgical scar identified above the left nipple. Targeted ultrasound is performed of the medial left breast, corresponding to the area of clinical concern, as directed by the patient. There is skin thickening up to 7 mm at the site of patient's palpable abnormality, 9 o'clock axis, corresponding to the mammographic appearance. There is no fluid collection or confluent edema within the skin. There are scattered areas of ill-defined fluid/edema within the superficial breast, underlying the area of skin thickening. No circumscribed fluid collection or abscess. No suspicious solid or cystic masses. Ultrasound was also performed of the subareolar left breast, corresponding to the area of distortion seen on mammogram. There is a vague hypoechoic area within the subareolar left breast at the 1 o'clock axis, measuring 2.4 cm in length and 0.7 cm in width, with extension to the undersurface of the skin, with mild posterior acoustic shadowing, somewhat suggestive of scarring, less than expected for the distortion seen on mammogram. IMPRESSION: 1. Distortion within the subareolar left breast, 12-1 o'clock axis region, with associated microcalcifications which are only visible on tomosynthesis images (best seen on tomosynthesis CC slice 51). There is an associated palpable soft tissue thickening at the upper margin of the left nipple. This distortion is a large distance from the surgical changes in the upper outer quadrant and, therefore, unlikely to be associated with the earlier surgery. There are areas of lucency within the distortion raising the possibility of early developing fat necrosis. There are also faint microcalcifications within the area of distortion, only visible on tomosynthesis, which are also suggestive of early developing fat necrosis. This distortion could conceivably be related to tissue damage caused by radiation and/or infection, but malignancy cannot be excluded and  stereotactic biopsy is recommended. 2. Probably benign fat necrosis located just medial and inferior to the left breast lumpectomy site, at posterior depth. Follow-up left breast diagnostic mammogram is recommend in 6 months to ensure stability. 3. Skin thickening at the site of patient's palpable abnormality, 9 o'clock axis of the left breast, measuring up to 7 mm thickness. No mass or fluid within the skin. Scattered areas of ill-defined fluid/edema  within the superficial breast tissues, underlying the area of skin thickening, without circumscribed fluid collection or abscess, possibly related to patient's earlier radiation treatments. Clinical follow-up recommended. RECOMMENDATION: 1. Stereotactic-guided biopsy, with tomosynthesis guidance, for the area of architectural distortion within the subareolar left breast, 1 o'clock axis region. Stereotactic biopsy is scheduled for February 17th at 9 a.m. 2. If pathology results for the subareolar distortion is benign, recommend follow-up left breast diagnostic mammogram in 6 months for the probably benign fat necrosis calcifications in the outer left breast, at posterior depth, located just medial and inferior to the lumpectomy site. If pathology results for the subareolar distortion was positive for malignancy, would then recommend additional biopsy sampling of these calcifications. 3. Clinical follow-up for the skin thickening and underlying ill-defined fluid/edema at the 9 o'clock axis of the left breast. I have discussed the findings and recommendations with the patient. Results were also provided in writing at the conclusion of the visit. If applicable, a reminder letter will be sent to the patient regarding the next appointment. I will also review these images with Dr. Donne Hazel prior to the biopsy date. BI-RADS CATEGORY  4: Suspicious. Electronically Signed   By: Franki Cabot M.D.   On: 09/12/2015 13:25   Mm Diag Breast Tomo Uni Left  09/30/2015  ADDENDUM  REPORT: 09/30/2015 09:52 ADDENDUM: Pathology revealed FIBROCYSTIC CHANGES WITH ASSOCIATED MICROCALCIFICATIONS of the Left breast subareolar area. In addition to the above findings, prominent hyalinized fibrous tissue is also present within the specimen suggestive of scar formation. This was found to be concordant by Dr. Ammie Ferrier. Pathology results were discussed with the patient by telephone. The patient reported tenderness and is doing well after the biopsy. Post biopsy instructions and care were reviewed and questions were answered. The patient was encouraged to call [facility] for any additional concerns. Surgical consultation has been arranged with Dr. Rolm Bookbinder at [facility] on October 03, 2015. The patient has an appointment to see Dr. Lurline Del at [facility] [facility] The patient has calcifications in the left breast which have been determined to be probably benign (BIRADS 3) which need further evaluation. Additionally, given that the distortion in the left breast is so prominent, and there is no clear cause, and as the patient has had cancer in that left breast, MRI is recommended for further evaluation. If there is no suspicious target for biopsy identified on MRI, I would recommend biopsy of the probably benign left breast calcifications prior to surgical excision of the left breast distortion. These findings were communicated via Epic email to Dr. Donne Hazel on 09/29/2015 by Dr. Theda Sers. Pathology results reported by Terie Purser, RN on 09/30/2015. Electronically Signed   By: Ammie Ferrier M.D.   On: 09/30/2015 09:52  09/29/2015  ADDENDUM REPORT: 09/29/2015 08:27 CLINICAL DATA:  60 year old female presenting for stereotactic guided biopsy of distortion in the subareolar left breast. EXAM: LEFT BREAST STEREOTACTIC CORE NEEDLE BIOPSY COMPARISON:  Previous exams. FINDINGS: The patient and I discussed the  procedure of stereotactic-guided biopsy including benefits and alternatives. We discussed the high likelihood of a successful procedure. We discussed the risks of the procedure including infection, bleeding, tissue injury, clip migration, and inadequate sampling. Informed written consent was given. The usual time out protocol was performed immediately prior to the procedure. Using sterile technique and 1% Lidocaine as local anesthetic, under stereotactic guidance, a 9 gauge vacuum assisted device was used to perform core needle  biopsy of distortion in the subareolar left breast using a lateral approach. At the conclusion of the procedure, a coil shaped tissue marker clip was deployed into the biopsy cavity. Follow-up 2-view mammogram was performed and dictated separately. IMPRESSION: Stereotactic-guided biopsy of distortion in the subareolar left breast. No apparent complications. Electronically Signed   By: Ammie Ferrier M.D.   On: 09/29/2015 08:27  09/30/2015  CLINICAL DATA:  Post biopsy mammogram of the left breast for clip placement. EXAM: 3D DIAGNOSTIC LEFT MAMMOGRAM POST STEREOTACTIC BIOPSY COMPARISON:  Previous exam(s). FINDINGS: 3D Mammographic images were obtained following stereotactic guided biopsy of distortion in the subareolar left breast. The coil shaped biopsy marking clip is appropriately positioned at the intended site of biopsy in the subareolar left breast. IMPRESSION: Appropriate positioning of the coil shaped biopsy marking clip in the subareolar left breast at the site of distortion. Final Assessment: Post Procedure Mammograms for Marker Placement Electronically Signed: By: Ammie Ferrier M.D. On: 09/26/2015 10:19   Mm Diag Breast Tomo Bilateral  09/12/2015  CLINICAL DATA:  History of left breast cancer in 2016 status post lumpectomy and radiation therapy. Patient describes recent palpable abnormality within the inner left breast, improved status post 10 day course of antibiotics.  EXAM: DIGITAL DIAGNOSTIC BILATERAL MAMMOGRAM WITH 3D TOMOSYNTHESIS WITH CAD ULTRASOUND LEFT BREAST COMPARISON:  Previous exam(s). ACR Breast Density Category b: There are scattered areas of fibroglandular density. FINDINGS: There are expected postsurgical changes within the outer left breast. There is diffuse skin thickening and trabecular thickening, consistent with expected changes status post radiation therapy. There are new calcifications located just medial and inferior to the lumpectomy site. On magnification views, these appear predominantly coarse with associated lucent centers suggesting fat necrosis. No dominant masses or suspicious calcifications within the inner left breast, corresponding to the area of clinical concern with overlying skin marker in place. There is architectural distortion within the subareolar left breast, slightly above the nipple, 1 o'clock axis region. Grouped calcifications are seen within this area of distortion, at the upper margin, only able to be seen on tomosynthesis images, best seen on tomosynthesis CC slice 51. This distortion persists on additional spot compression views, however, there are some lucent areas within the distortion reminiscent of early developing fat necrosis. Mammographic images were processed with CAD. On physical exam, there is soft tissue thickening within the inner left breast without evidence of circumscribed mass. No erythema seen. There is also skin thickening just above the left nipple. No postsurgical scar identified above the left nipple. Targeted ultrasound is performed of the medial left breast, corresponding to the area of clinical concern, as directed by the patient. There is skin thickening up to 7 mm at the site of patient's palpable abnormality, 9 o'clock axis, corresponding to the mammographic appearance. There is no fluid collection or confluent edema within the skin. There are scattered areas of ill-defined fluid/edema within the  superficial breast, underlying the area of skin thickening. No circumscribed fluid collection or abscess. No suspicious solid or cystic masses. Ultrasound was also performed of the subareolar left breast, corresponding to the area of distortion seen on mammogram. There is a vague hypoechoic area within the subareolar left breast at the 1 o'clock axis, measuring 2.4 cm in length and 0.7 cm in width, with extension to the undersurface of the skin, with mild posterior acoustic shadowing, somewhat suggestive of scarring, less than expected for the distortion seen on mammogram. IMPRESSION: 1. Distortion within the subareolar left breast, 12-1 o'clock axis region,  with associated microcalcifications which are only visible on tomosynthesis images (best seen on tomosynthesis CC slice 51). There is an associated palpable soft tissue thickening at the upper margin of the left nipple. This distortion is a large distance from the surgical changes in the upper outer quadrant and, therefore, unlikely to be associated with the earlier surgery. There are areas of lucency within the distortion raising the possibility of early developing fat necrosis. There are also faint microcalcifications within the area of distortion, only visible on tomosynthesis, which are also suggestive of early developing fat necrosis. This distortion could conceivably be related to tissue damage caused by radiation and/or infection, but malignancy cannot be excluded and stereotactic biopsy is recommended. 2. Probably benign fat necrosis located just medial and inferior to the left breast lumpectomy site, at posterior depth. Follow-up left breast diagnostic mammogram is recommend in 6 months to ensure stability. 3. Skin thickening at the site of patient's palpable abnormality, 9 o'clock axis of the left breast, measuring up to 7 mm thickness. No mass or fluid within the skin. Scattered areas of ill-defined fluid/edema within the superficial breast tissues,  underlying the area of skin thickening, without circumscribed fluid collection or abscess, possibly related to patient's earlier radiation treatments. Clinical follow-up recommended. RECOMMENDATION: 1. Stereotactic-guided biopsy, with tomosynthesis guidance, for the area of architectural distortion within the subareolar left breast, 1 o'clock axis region. Stereotactic biopsy is scheduled for February 17th at 9 a.m. 2. If pathology results for the subareolar distortion is benign, recommend follow-up left breast diagnostic mammogram in 6 months for the probably benign fat necrosis calcifications in the outer left breast, at posterior depth, located just medial and inferior to the lumpectomy site. If pathology results for the subareolar distortion was positive for malignancy, would then recommend additional biopsy sampling of these calcifications. 3. Clinical follow-up for the skin thickening and underlying ill-defined fluid/edema at the 9 o'clock axis of the left breast. I have discussed the findings and recommendations with the patient. Results were also provided in writing at the conclusion of the visit. If applicable, a reminder letter will be sent to the patient regarding the next appointment. I will also review these images with Dr. Donne Hazel prior to the biopsy date. BI-RADS CATEGORY  4: Suspicious. Electronically Signed   By: Franki Cabot M.D.   On: 09/12/2015 13:25   Mm Lt Breast Bx W Loc Dev 1st Lesion Image Bx Spec Stereo Guide  09/30/2015  ADDENDUM REPORT: 09/30/2015 09:52 ADDENDUM: Pathology revealed FIBROCYSTIC CHANGES WITH ASSOCIATED MICROCALCIFICATIONS of the Left breast subareolar area. In addition to the above findings, prominent hyalinized fibrous tissue is also present within the specimen suggestive of scar formation. This was found to be concordant by Dr. Ammie Ferrier. Pathology results were discussed with the patient by telephone. The patient reported tenderness and is doing well after the  biopsy. Post biopsy instructions and care were reviewed and questions were answered. The patient was encouraged to call [facility] for any additional concerns. Surgical consultation has been arranged with Dr. Rolm Bookbinder at [facility] on October 03, 2015. The patient has an appointment to see Dr. Lurline Del at [facility] on October 07, 2015. The patient has calcifications in the left breast which have been determined to be probably benign (BIRADS 3) which need further evaluation. Additionally, given that the distortion in the left breast is so prominent, and there is no clear cause, and as the patient has had cancer  in that left breast, MRI is recommended for further evaluation. If there is no suspicious target for biopsy identified on MRI, I would recommend biopsy of the probably benign left breast calcifications prior to surgical excision of the left breast distortion. These findings were communicated via Epic email to Dr. Donne Hazel on 09/29/2015 by Dr. Theda Sers. Pathology results reported by Terie Purser, RN on 09/30/2015. Electronically Signed   By: Ammie Ferrier M.D.   On: 09/30/2015 09:52  09/29/2015  ADDENDUM REPORT: 09/29/2015 08:27 CLINICAL DATA:  60 year old female presenting for stereotactic guided biopsy of distortion in the subareolar left breast. EXAM: LEFT BREAST STEREOTACTIC CORE NEEDLE BIOPSY COMPARISON:  Previous exams. FINDINGS: The patient and I discussed the procedure of stereotactic-guided biopsy including benefits and alternatives. We discussed the high likelihood of a successful procedure. We discussed the risks of the procedure including infection, bleeding, tissue injury, clip migration, and inadequate sampling. Informed written consent was given. The usual time out protocol was performed immediately prior to the procedure. Using sterile technique and 1% Lidocaine as local anesthetic, under stereotactic  guidance, a 9 gauge vacuum assisted device was used to perform core needle biopsy of distortion in the subareolar left breast using a lateral approach. At the conclusion of the procedure, a coil shaped tissue marker clip was deployed into the biopsy cavity. Follow-up 2-view mammogram was performed and dictated separately. IMPRESSION: Stereotactic-guided biopsy of distortion in the subareolar left breast. No apparent complications. Electronically Signed   By: Ammie Ferrier M.D.   On: 09/29/2015 08:27  09/30/2015  CLINICAL DATA:  Post biopsy mammogram of the left breast for clip placement. EXAM: 3D DIAGNOSTIC LEFT MAMMOGRAM POST STEREOTACTIC BIOPSY COMPARISON:  Previous exam(s). FINDINGS: 3D Mammographic images were obtained following stereotactic guided biopsy of distortion in the subareolar left breast. The coil shaped biopsy marking clip is appropriately positioned at the intended site of biopsy in the subareolar left breast. IMPRESSION: Appropriate positioning of the coil shaped biopsy marking clip in the subareolar left breast at the site of distortion. Final Assessment: Post Procedure Mammograms for Marker Placement Electronically Signed: By: Ammie Ferrier M.D. On: 09/26/2015 10:19    ASSESSMENT: 60 y.o. [place] woman status post left breast upper outer quadrant biopsy 09/09/2014 for a clinical T1b N0, stage IA invasive ductal carcinoma, grade 2 or 3, weakly estrogen receptor positive at 11%, progesterone receptor and HER-2 negative, with an MIB-1 of 35%  (1) status post left lumpectomy and sentinel lymph node sampling 10/01/2014 for a pT1c pN0, stage IA invasive ductal carcinoma, grade 3, repeat HER-2 again negative.  (a) positive posterior margin cleared with subsequent excision 10/21/2014  (2) Mammaprint shows a basal type tumor which is clearly high risk. 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 I spent approximately 40 minutes with her going over her history and prognosis. We discussed the fact that we do not  know whether or not she has breast cancer in the breast at this time, but there are some suspicious areas that do need to be biopsied. I have gone ahead and placed an order for MRI biopsy to be done within the next week.  Hopefully we will find only fat necrosis or a similarly benign finding. However her breast architecture is complex and she may need closer follow-up, with repeat mammography in 6 months and possibly a repeat MRI in one year.  We should have the results of her biopsies by the time of her return visit with me mid-March. She will let me know if any problems develop before that visit.     Chauncey Cruel, MD   10/07/2015 11:31 AM

## 2015-10-07 NOTE — Telephone Encounter (Signed)
Scheduled patient appt per pof, avs report printed.  Patient aware central scheduling will contact to schedule MR Biopsy.

## 2015-10-10 ENCOUNTER — Other Ambulatory Visit: Payer: Self-pay | Admitting: Oncology

## 2015-10-10 ENCOUNTER — Other Ambulatory Visit: Payer: Self-pay | Admitting: *Deleted

## 2015-10-10 DIAGNOSIS — R928 Other abnormal and inconclusive findings on diagnostic imaging of breast: Secondary | ICD-10-CM

## 2015-10-13 ENCOUNTER — Other Ambulatory Visit: Payer: Self-pay | Admitting: Oncology

## 2015-10-13 DIAGNOSIS — R928 Other abnormal and inconclusive findings on diagnostic imaging of breast: Secondary | ICD-10-CM

## 2015-10-15 ENCOUNTER — Inpatient Hospital Stay: Admission: RE | Admit: 2015-10-15 | Payer: BC Managed Care – PPO | Source: Ambulatory Visit

## 2015-10-15 ENCOUNTER — Ambulatory Visit
Admission: RE | Admit: 2015-10-15 | Discharge: 2015-10-15 | Disposition: A | Discharge status: Home / Self Care | Payer: BC Managed Care – PPO | Source: Ambulatory Visit | Attending: Oncology | Admitting: Oncology

## 2015-10-15 ENCOUNTER — Other Ambulatory Visit: Payer: BC Managed Care – PPO

## 2015-10-15 DIAGNOSIS — R928 Other abnormal and inconclusive findings on diagnostic imaging of breast: Secondary | ICD-10-CM

## 2015-10-15 HISTORY — PX: BREAST BIOPSY: SHX20

## 2015-10-15 MED ORDER — GADOBENATE DIMEGLUMINE 529 MG/ML IV SOLN
20.0000 mL | Freq: Once | INTRAVENOUS | Status: AC | PRN
  Administered 2015-10-15: 20 mL via INTRAVENOUS

## 2015-10-17 ENCOUNTER — Telehealth: Payer: Self-pay | Admitting: Nurse Practitioner

## 2015-10-17 ENCOUNTER — Ambulatory Visit: Payer: BC Managed Care – PPO | Admitting: Oncology

## 2015-10-17 ENCOUNTER — Telehealth: Payer: Self-pay | Admitting: Oncology

## 2015-10-17 NOTE — Telephone Encounter (Signed)
Patient left message to cx f/u due to she has already [facility]. Returned call to patient re rescheduling and per patient she does not wish to reschedule at this time and if there is something else that Dr. Jana Hakim thinks she should do we can call her and she will come in. Desk nurse informed.

## 2015-10-17 NOTE — Telephone Encounter (Signed)
Attempted to reach patient after recent biopsy to check on status.  Left message asking for return call.  Will await return call from patient.

## 2015-10-22 ENCOUNTER — Telehealth: Payer: Self-pay | Admitting: Nurse Practitioner

## 2015-10-22 NOTE — Telephone Encounter (Signed)
Received call from [name].  Doing well following recent biopsy.  No pain or signs of infection.  Will coordinate follow up visits with Drs. Magrinat and Donne Hazel.  Pt without questions at this time.

## 2015-10-30 ENCOUNTER — Telehealth: Payer: Self-pay | Admitting: Oncology

## 2015-10-30 ENCOUNTER — Other Ambulatory Visit: Payer: Self-pay | Admitting: Oncology

## 2015-10-30 NOTE — Telephone Encounter (Signed)
Left vm for patient about follow up appt 4/26 at 145 pm per GM 3/23 pof

## 2015-11-02 ENCOUNTER — Other Ambulatory Visit: Payer: Self-pay | Admitting: Oncology

## 2015-11-03 ENCOUNTER — Other Ambulatory Visit: Payer: Self-pay | Admitting: Oncology

## 2015-11-03 NOTE — Progress Notes (Unsigned)
Dominique Dillon biopsy showed only fat necrosis. Whenever Dr. Donne Hazel has reviewed this with radiology and the conclusion appears to be that this is discordant. He suggested to the patient and that an excisional biopsy be performed but she has preferred a six-month follow-up study.

## 2015-12-03 ENCOUNTER — Ambulatory Visit (HOSPITAL_BASED_OUTPATIENT_CLINIC_OR_DEPARTMENT_OTHER): Payer: BC Managed Care – PPO | Admitting: Nurse Practitioner

## 2015-12-03 ENCOUNTER — Telehealth: Payer: Self-pay | Admitting: Oncology

## 2015-12-03 ENCOUNTER — Encounter: Payer: Self-pay | Admitting: Nurse Practitioner

## 2015-12-03 VITALS — BP 114/72 | HR 95 | Temp 97.9°F | Resp 18 | Wt 213.8 lb

## 2015-12-03 DIAGNOSIS — C50412 Malignant neoplasm of upper-outer quadrant of left female breast: Secondary | ICD-10-CM | POA: Diagnosis not present

## 2015-12-03 DIAGNOSIS — N649 Disorder of breast, unspecified: Secondary | ICD-10-CM | POA: Diagnosis not present

## 2015-12-03 DIAGNOSIS — E119 Type 2 diabetes mellitus without complications: Secondary | ICD-10-CM

## 2015-12-03 DIAGNOSIS — G629 Polyneuropathy, unspecified: Secondary | ICD-10-CM

## 2015-12-03 NOTE — Progress Notes (Signed)
-  Thei nformation on radiaology's interpretation of Ms Jett's lesion is as below:  ---- Message -----   From: Jamie Kato, MD   Sent: 09/29/2015  4:01 PM    To: Rolm Bookbinder, MD  Subject: Recommendations                  Hi Matt,   I did the biopsy for the distortion in the subareolar left breast for Ms. Makey last week and the results came back as fibrocystic change and possible scar. Given how pronounced the distortion is, I don't think fibrocystic changes accounts for the appearance. It doesn't sound like you were in that area surgically, and given that she didn't have an abscess there, I don't know why the scarring would be so exhuberrant. So all that being said, I am not comfortable with that pathology.   Also, she has calcifications in the left breast that we are calling probably benign (I agree with that). But if she has surgery for the distortion, then we should biopsy those to prove it.   My suggestion for follow-up is to have her get an MRI since there are still a couple of question marks. If there is a concerning target by MRI, then we biopsy that. If not, then we biopsy the calcifications that are probably benign and then I'd have her come see you for excision of the subareolar distortion.   This is a long email, so if you need any clarification or if you would like to talk about it more, please give me a call! I am at the breast center tomorrow.   Best,  Sharyn Lull

## 2015-12-03 NOTE — Progress Notes (Signed)
Dominique Dillon  [phone] [phone]   ID: Dominique Dillon DOB: 05-03-1956  MR#: [id number]  LKT#:[id number]  Patient Care Team: Dominique Commons, FNP as PCP - General (Internal Medicine) Dominique Pretty, MD as Referring Physician (Internal Medicine) Dominique Pigg, MD as Consulting Physician (Obstetrics and Gynecology) Dominique Bookbinder, MD as Consulting Physician (General Surgery) Dominique Cruel, MD as Consulting Physician (Oncology) Dominique Gibson, MD as Consulting Physician (Radiation Oncology) Dominique Germany, RN as Registered Nurse Dominique Kaufmann, RN as Registered Nurse Dominique Bouche, NP as Nurse Practitioner (Nurse Practitioner) Dominique Cheese, NP as Nurse Practitioner (Hematology and Oncology) OTHER MD:  CHIEF COMPLAINT: Triple negative breast cancer  CURRENT TREATMENT: observation  BREAST CANCER HISTORY: From the original intake note:  Dominique Dillon had bilateral screening mammography [facility] 08/15/2014 showing a possible mass in the left breast. Left diagnostic mammography and ultrason[facility]2016 showed the breast density to be category B. There was a persistent spiculated mass in the upper outer quadrant of the left breast, which by ultrasonography measured 0.74 cm. Ultrasound of the left axilla was negative. Biopsy of the left breast mass in question 09/09/2014 showed ([id number]) and invasive ductal carcinoma, grade 2 or 3, E-cadherin positive, estrogen receptor 11% positive with weak staining intensity, progesterone receptor negative, HER-2 negative, with an MIB-1 of 35%.  The patient's subsequent history is as detailed below  INTERVAL HISTORY: Dominique Dillon returns today for follow-up of her early stage, triple negative breast cancer. Since her last visit she has had a repeat biopsy of a suspicious area in her left breast. The pathology returned negative, but there is some concern that the sample  tested is not an accurate representation of the mass. Dominique Dillon is hesitant to have further biopsies performed.  REVIEW OF SYSTEMS: Dominique Dillon's hair is thin. She is still wearing wigs full time and this is distressing to her. She still has residual neuropathy that is lightening up slowly. She is short of breath when she walks up stairs. She has night sweats. She sleeps on 2 pillows. She has aches and pains here and there which are not more persistent or intense than prior. She has hot flashes. She tells me her diabetes is well-controlled. She denies fever, rash, or bleeding. A detailed review of systems today was otherwise stable  PAST MEDICAL HISTORY: Past Medical History  Diagnosis Date  . GERD (gastroesophageal reflux disease)   . Obesity   . Breast cancer of upper-outer quadrant of left female breast ([place]) 09/12/2014  . History of anemia     still takes iron supplement  . Arthritis     knees  . Hypertension     under control with med., has been on med. since 1990s  . Insulin dependent diabetes mellitus ([place])   . Seasonal asthma     no current med.  . DDD (degenerative disc disease), cervical     PAST SURGICAL HISTORY: Past Surgical History  Procedure Laterality Date  . Cesarean section      x 2  . Knee arthroscopy Bilateral   . Abdominal hysterectomy      complete  . Cholecystectomy    . Appendectomy    . Tonsillectomy    . Irrigation and debridement abscess N/A 03/08/2014    Procedure: IRRIGATION AND DEBRIDEMENT ABDOMINAL WALL ABSCESS;  Surgeon: Dominique Hector, MD;  Location: [facility];  Service: General;  Laterality: N/A;  . Colonoscopy    . Upper gi endoscopy    .  Radioactive seed guided mastectomy with axillary sentinel lymph node biopsy Left 10/01/2014    Procedure: RADIOACTIVE SEED GUIDED LEFT BREAST LUMPECTOMY WITH LEFT  AXILLARY SENTINEL LYMPH NODE BIOPSY;  Surgeon: Dominique Bookbinder, MD;  Location: [place];  Service: General;  Laterality: Left;  . Carpal  tunnel release Right 08/06/2004  . Trigger finger release Right 08/06/2004    middle finger  . Portacath placement Right 10/21/2014    Procedure: INSERTION PORT-A-CATH;  Surgeon: Dominique Bookbinder, MD;  Location: [place];  Service: General;  Laterality: Right;  . Re-excision of breast lumpectomy Left 10/21/2014    Procedure: RE-EXCISION OF BREAST CANCER, POSTERIOR MARGINS;  Surgeon: Dominique Bookbinder, MD;  Location: [place];  Service: General;  Laterality: Left;  . Nm myoview ltd  06/2015    LOW RISK. HTN response to exercise. No EKG changes. Small, partially reversible apical defect (probably Breast Attenuation, but CRO apical Ischemia).  EF 63% with no RWMA    FAMILY HISTORY Family History  Problem Relation Age of Onset  . Prostate cancer Father   . Stomach cancer Maternal Aunt   . Pancreatic cancer Maternal Uncle   . Lung cancer Maternal Grandmother   . Breast cancer Maternal Aunt   . Pancreatic cancer Maternal Aunt   . Pancreatic cancer Maternal Uncle    there is significant stomach (one case) and pancreatic cancer (3 cases) on the maternal side. There is also a history of breast cancer on the maternal side(1 and diagnosed at age 47). The patient's father died the age of 62 with prostate cancer  GYNECOLOGIC HISTORY:  No LMP recorded. Patient has had a hysterectomy. Menarche age 86, first live birth age 8, the patient is G X P2. She underwent hysterectomy with salpingo-oophorectomy in 1995. She did not take hormone replacement.  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 She is widowed. At home she lives with her adopted daughter Dominique Dillon. The patient's 2 biologic daughter are Dominique Dillon who works as a Recruitment consultant and Dominique Dillon who works as a Land, both in [place]. The patient also has a goddaughter, Dominique Dillon, whom she help raise and whom she considers as a daughter    ADVANCED DIRECTIVES:  Not in place. At her to 05/29/2015 visit the patient was given the appropriate documents 2 complete and notarize at her discretion. She tells me she intends to name her daughter Dominique Dillon as her healthcare power of attorney. 68 can be reached at [place]: Social History  Substance Use Topics  . Smoking status: Never Smoker   . Smokeless tobacco: Never Used  . Alcohol Use: No     Colonoscopy: [place], 2011  PAP:  Bone density: Remote  Lipid panel:  Allergies  Allergen Reactions  . Macrobid [Nitrofurantoin Monohyd Macro] Hives and Itching    Chest pain  . Biaxin [Clarithromycin] Other (See Comments)    UNKNOWN    Current Outpatient Prescriptions  Medication Sig Dispense Refill  . aspirin 81 MG tablet Take 81 mg by mouth daily.    . Calcium Carbonate (CALCIUM 600 PO) Take 600 mg by mouth daily.     . cetirizine (ZYRTEC) 10 MG tablet Take 10 mg by mouth daily.    . Cholecalciferol (VITAMIN D) 1000 UNITS capsule Take 1,000 Units by mouth daily.    . DULoxetine (CYMBALTA) 60 MG capsule Take 60 mg by mouth daily.    . ferrous  sulfate 325 (65 FE) MG tablet Take 325 mg by mouth daily with breakfast.    . hydrochlorothiazide (MICROZIDE) 12.5 MG capsule Take 1 capsule (12.5 mg total) by mouth daily. 30 capsule 11  . LIVALO 4 MG TABS Take 4 mg by mouth daily.  0  . losartan (COZAAR) 25 MG tablet Take 1 tablet (25 mg total) by mouth daily. 30 tablet 11  . Magnesium 250 MG TABS Take 250 mg by mouth 2 (two) times daily.     . metFORMIN (GLUCOPHAGE) 1000 MG tablet Take 1,000 mg by mouth 2 (two) times daily with a meal.    . NOVOLOG MIX 70/30 FLEXPEN (70-30) 100 UNIT/ML FlexPen Inject 32 Units into the skin 2 (two) times daily.   1  . Omega-3 Fatty Acids (FISH OIL PO) Take 1 tablet by mouth daily.     . pantoprazole (PROTONIX) 40 MG tablet Take 40 mg by mouth daily.     No current facility-administered medications for this visit.    OBJECTIVE: Middle-aged African-American  woman who appears well Filed Vitals:   12/03/15 1349  BP: 114/72  Pulse: 95  Temp: 97.9 F (36.6 C)  Resp: 18     Body mass index is 37.88 kg/(m^2).    ECOG FS:0 - Asymptomatic  Skin: warm, dry  HEENT: sclerae anicteric, conjunctivae pink, oropharynx clear. No thrush or mucositis.  Lymph Nodes: No cervical or supraclavicular lymphadenopathy  Lungs: clear to auscultation bilaterally, no rales, wheezes, or rhonci  Heart: regular rate and rhythm  Abdomen: round, soft, non tender, positive bowel sounds  Musculoskeletal: No focal spinal tenderness, no peripheral edema  Neuro: non focal, well oriented, positive affect  Breasts:  Left breast status post lumpectomy, radiation, and repeat biopsies. Residual hyperpigmentation noted. Left axilla benign. Right breast unremarkable.   LAB RESULTS:  CMP     Component Value Date/Time   NA 141 09/30/2015 0914   NA 137 05/20/2015 0029   K 4.4 09/30/2015 0914   K 3.2* 05/20/2015 0029   CL 100* 05/20/2015 0029   CO2 27 09/30/2015 0914   CO2 24 05/20/2015 0029   GLUCOSE 108 09/30/2015 0914   GLUCOSE 169* 05/20/2015 0029   BUN 14.3 09/30/2015 0914   BUN 15 05/20/2015 0029   CREATININE 0.8 09/30/2015 0914   CREATININE 0.96 05/20/2015 0945   CALCIUM 9.7 09/30/2015 0914   CALCIUM 9.3 05/20/2015 0029   PROT 7.5 09/30/2015 0914   PROT 7.1 03/08/2014 0500   ALBUMIN 3.8 09/30/2015 0914   ALBUMIN 3.1* 03/08/2014 0500   AST 21 09/30/2015 0914   AST 12 03/08/2014 0500   ALT 13 09/30/2015 0914   ALT 8 03/08/2014 0500   ALKPHOS 102 09/30/2015 0914   ALKPHOS 90 03/08/2014 0500   BILITOT 0.37 09/30/2015 0914   BILITOT 0.6 03/08/2014 0500   GFRNONAA >60 05/20/2015 0945   GFRAA >60 05/20/2015 0945    INo results found for: SPEP, UPEP  Lab Results  Component Value Date   WBC 5.5 09/30/2015   NEUTROABS 3.4 09/30/2015   HGB 11.7 09/30/2015   HCT 36.7 09/30/2015   MCV 73.6* 09/30/2015   PLT 290 09/30/2015      Chemistry      Component  Value Date/Time   NA 141 09/30/2015 0914   NA 137 05/20/2015 0029   K 4.4 09/30/2015 0914   K 3.2* 05/20/2015 0029   CL 100* 05/20/2015 0029   CO2 27 09/30/2015 0914   CO2 24 05/20/2015 0029  BUN 14.3 09/30/2015 0914   BUN 15 05/20/2015 0029   CREATININE 0.8 09/30/2015 0914   CREATININE 0.96 05/20/2015 0945      Component Value Date/Time   CALCIUM 9.7 09/30/2015 0914   CALCIUM 9.3 05/20/2015 0029   ALKPHOS 102 09/30/2015 0914   ALKPHOS 90 03/08/2014 0500   AST 21 09/30/2015 0914   AST 12 03/08/2014 0500   ALT 13 09/30/2015 0914   ALT 8 03/08/2014 0500   BILITOT 0.37 09/30/2015 0914   BILITOT 0.6 03/08/2014 0500       No results found for: LABCA2  No components found for: LABCA125  No results for input(s): INR in the last 168 hours.  Urinalysis    Component Value Date/Time   COLORURINE YELLOW 05/20/2015 0755   APPEARANCEUR CLEAR 05/20/2015 0755   LABSPEC 1.034* 05/20/2015 0755   PHURINE 5.0 05/20/2015 0755   GLUCOSEU NEGATIVE 05/20/2015 0755   HGBUR NEGATIVE 05/20/2015 0755   BILIRUBINUR NEGATIVE 05/20/2015 0755   [place] 05/20/2015 0755   PROTEINUR NEGATIVE 05/20/2015 0755   UROBILINOGEN 0.2 05/20/2015 0755   NITRITE NEGATIVE 05/20/2015 0755   LEUKOCYTESUR NEGATIVE 05/20/2015 0755    STUDIES: No results found.  ASSESSMENT: 60 y.o. [place] woman status post left breast upper outer quadrant biopsy 09/09/2014 for a clinical T1b N0, stage IA invasive ductal carcinoma, grade 2 or 3, weakly estrogen receptor positive at 11%, progesterone receptor and HER-2 negative, with an MIB-1 of 35%  (1) status post left lumpectomy and sentinel lymph node sampling 10/01/2014 for a pT1c pN0, stage IA invasive ductal carcinoma, grade 3, repeat HER-2 again negative.  (a) positive posterior margin cleared with subsequent excision 10/21/2014  (2) Mammaprint shows a basal type tumor which is clearly high risk. It confirms that the tumor is the triple negative   (3)  adjuvant chemotherapy starting 11/11/2014 consisting of cyclophosphamide and docetaxel 4, with Neulasta support, completed 01/14/2015  (4) adjuvant radiation 02/12/15 to 03/25/15:  1) Left Breast, 50 Gy in 25 fractions 2) Left Breast Boost, 10 Gy in 5 fractions  (5) the patient has declined genetics testing   (6) thalassemia, likely beta, with pending ferritin results drawn 03/05/2015  (7) 2 suspicious areas in the left breast with MRI biopsy pending  PLAN:  Dr. Jana Hakim showed Delayna the images from her most recent breast MRI. He explained that Dr. Donne Hazel suggests an excisional biopsy to ensure that we are not dealing with more breast cancer. Lauris is very hesitant to proceed with this. Her other option is to continue surveillance. We could repeat a breast MRI and compare them to the results in March. If there are more aggressive changes she might then be willing to proceed with further tests and biopsies.  For her her hair, she will try OTC Rogaine. I am also willing to place a referral to a local dermatologist for any prescription strength remedies, but she is going to hold off for now.   Nayvie understands and agrees with this plan. We negotiated follow up in 4 months for repeat imaging and an observation visit with Dr. Jana Hakim. She has been encouraged to call with any issues that might arise before her next visit here.    Laurie Panda, NP   12/03/2015 3:26 PM    ADDENDUM:  I met with Regis so she could better understand the reason why we want an excisional biopsy of this area. I believe she understands it but she does not wish to proceed at this point. We  discussed follow-up schedules and she opted for a 4 month follow-up schedule. We will try to get her MRI repeated at that time.  She also had concerns regarding her hair growing back. This unfortunately can occur. Of course there are many reasons besides chemotherapy why her may not grow. I do not have a problem if she  wishes to try Rogaine but at this point she is not interested in that option  She will see me again in August. We will go over these issues again and in general she knows I am concerned  About the possibility of recurrenceand will call us if she develops any new symptoms.  I personally saw this patient and performed a substantive portion of this encounter with the listed APP documented above.   Dominique Cruel, MD Medical Oncology and Hematology [facility] [address] [place], [place] [address] Tel. [phone]    Fax. [phone]

## 2015-12-03 NOTE — Telephone Encounter (Signed)
Gave patient avs report and appointments for August 2017. Patient to call [facility] to schedule mri due 8/15.

## 2016-02-16 ENCOUNTER — Ambulatory Visit
Admission: RE | Admit: 2016-02-16 | Discharge: 2016-02-16 | Disposition: A | Discharge status: Home / Self Care | Payer: BC Managed Care – PPO | Source: Ambulatory Visit | Attending: Oncology | Admitting: Oncology

## 2016-02-16 ENCOUNTER — Other Ambulatory Visit: Payer: Self-pay | Admitting: Oncology

## 2016-02-16 ENCOUNTER — Other Ambulatory Visit: Payer: Self-pay | Admitting: *Deleted

## 2016-02-16 ENCOUNTER — Inpatient Hospital Stay: Admission: RE | Admit: 2016-02-16 | Payer: BC Managed Care – PPO | Source: Ambulatory Visit

## 2016-02-16 DIAGNOSIS — C50412 Malignant neoplasm of upper-outer quadrant of left female breast: Secondary | ICD-10-CM

## 2016-02-16 DIAGNOSIS — R928 Other abnormal and inconclusive findings on diagnostic imaging of breast: Secondary | ICD-10-CM

## 2016-02-16 MED ORDER — GADOBENATE DIMEGLUMINE 529 MG/ML IV SOLN
20.0000 mL | Freq: Once | INTRAVENOUS | Status: AC | PRN
  Administered 2016-02-16: 20 mL via INTRAVENOUS

## 2016-02-24 ENCOUNTER — Telehealth: Payer: Self-pay | Admitting: *Deleted

## 2016-02-24 NOTE — Telephone Encounter (Signed)
This RN spoke with pt per her call requesting results of MRI of breast obtained 02/16/2016. Informed her area of noted concern is smaller on this MRI- noted possible need for biopsy per Dr Donne Hazel and pt needs to have his opinion of noted area for appropriate follow up.  Dominique Dillon verbalized understanding and stated " I will wait to hear from him  "  Best number to contact pt given as [phone].

## 2016-03-29 ENCOUNTER — Telehealth: Payer: Self-pay | Admitting: Oncology

## 2016-03-29 NOTE — Telephone Encounter (Signed)
03/31/2016 Appointments canceled per patient request.

## 2016-03-31 ENCOUNTER — Ambulatory Visit: Payer: BC Managed Care – PPO | Admitting: Oncology

## 2016-03-31 ENCOUNTER — Other Ambulatory Visit: Payer: BC Managed Care – PPO

## 2016-09-02 ENCOUNTER — Other Ambulatory Visit: Payer: Self-pay | Admitting: Cardiology

## 2016-09-02 IMAGING — CR DG CHEST 1V PORT
1 series · 1 of 1 positions shown · non-contrast
Comparison: PA and lateral chest 07/28/2009.

CLINICAL DATA: Status post Port-A-Cath placement today. Breast
cancer.

EXAM:
PORTABLE CHEST - 1 VIEW

[AP]
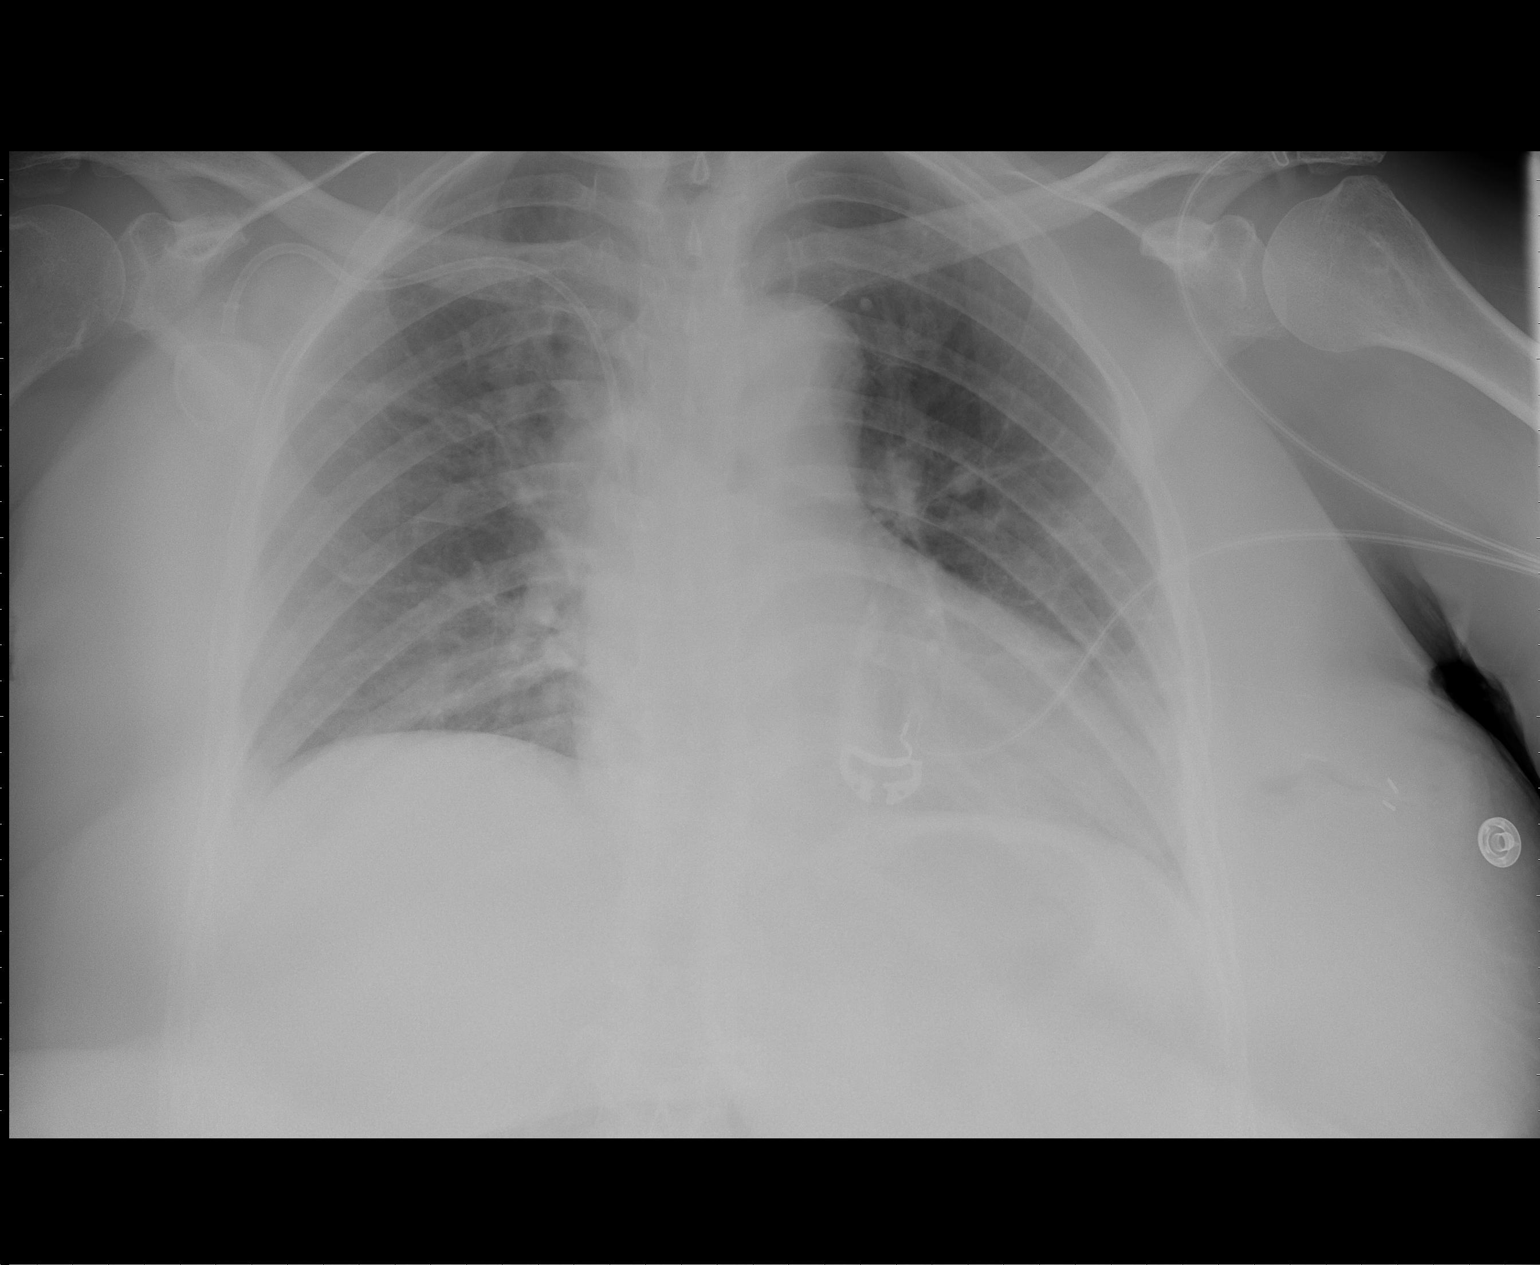

[1 of 1 positions shown; findings below may reference images not displayed]

FINDINGS: Right subclavian approach Port-A-Cath is in place with the tip
projecting over the mid to lower superior vena cava. No pneumothorax
identified. Lung volumes are low but the lungs are clear. Gas in the
left breast and surgical clips are noted.
IMPRESSION: Tip of right IJ approach Port-A-Cath projects over the mid to lower
superior vena cava. No pneumothorax identified.

## 2017-01-21 ENCOUNTER — Other Ambulatory Visit: Payer: Self-pay | Admitting: Obstetrics and Gynecology

## 2017-01-21 DIAGNOSIS — Z853 Personal history of malignant neoplasm of breast: Secondary | ICD-10-CM

## 2017-01-21 DIAGNOSIS — Z9889 Other specified postprocedural states: Secondary | ICD-10-CM

## 2017-01-25 ENCOUNTER — Ambulatory Visit
Admission: RE | Admit: 2017-01-25 | Discharge: 2017-01-25 | Disposition: A | Discharge status: Home / Self Care | Payer: BC Managed Care – PPO | Source: Ambulatory Visit | Attending: Obstetrics and Gynecology | Admitting: Obstetrics and Gynecology

## 2017-01-25 DIAGNOSIS — Z853 Personal history of malignant neoplasm of breast: Secondary | ICD-10-CM

## 2017-01-25 DIAGNOSIS — Z9889 Other specified postprocedural states: Secondary | ICD-10-CM

## 2017-01-25 HISTORY — DX: Personal history of irradiation: Z92.3

## 2017-01-25 HISTORY — DX: Personal history of antineoplastic chemotherapy: Z92.21

## 2017-03-31 IMAGING — CR DG CHEST 2V
2 series · 2 of 2 positions shown · non-contrast
Comparison: Chest CTA 02/13/2015 and earlier.

CLINICAL DATA: 59-year-old female with dizziness and chest pain.
Initial encounter.

EXAM:
CHEST  2 VIEW

[chest pa]
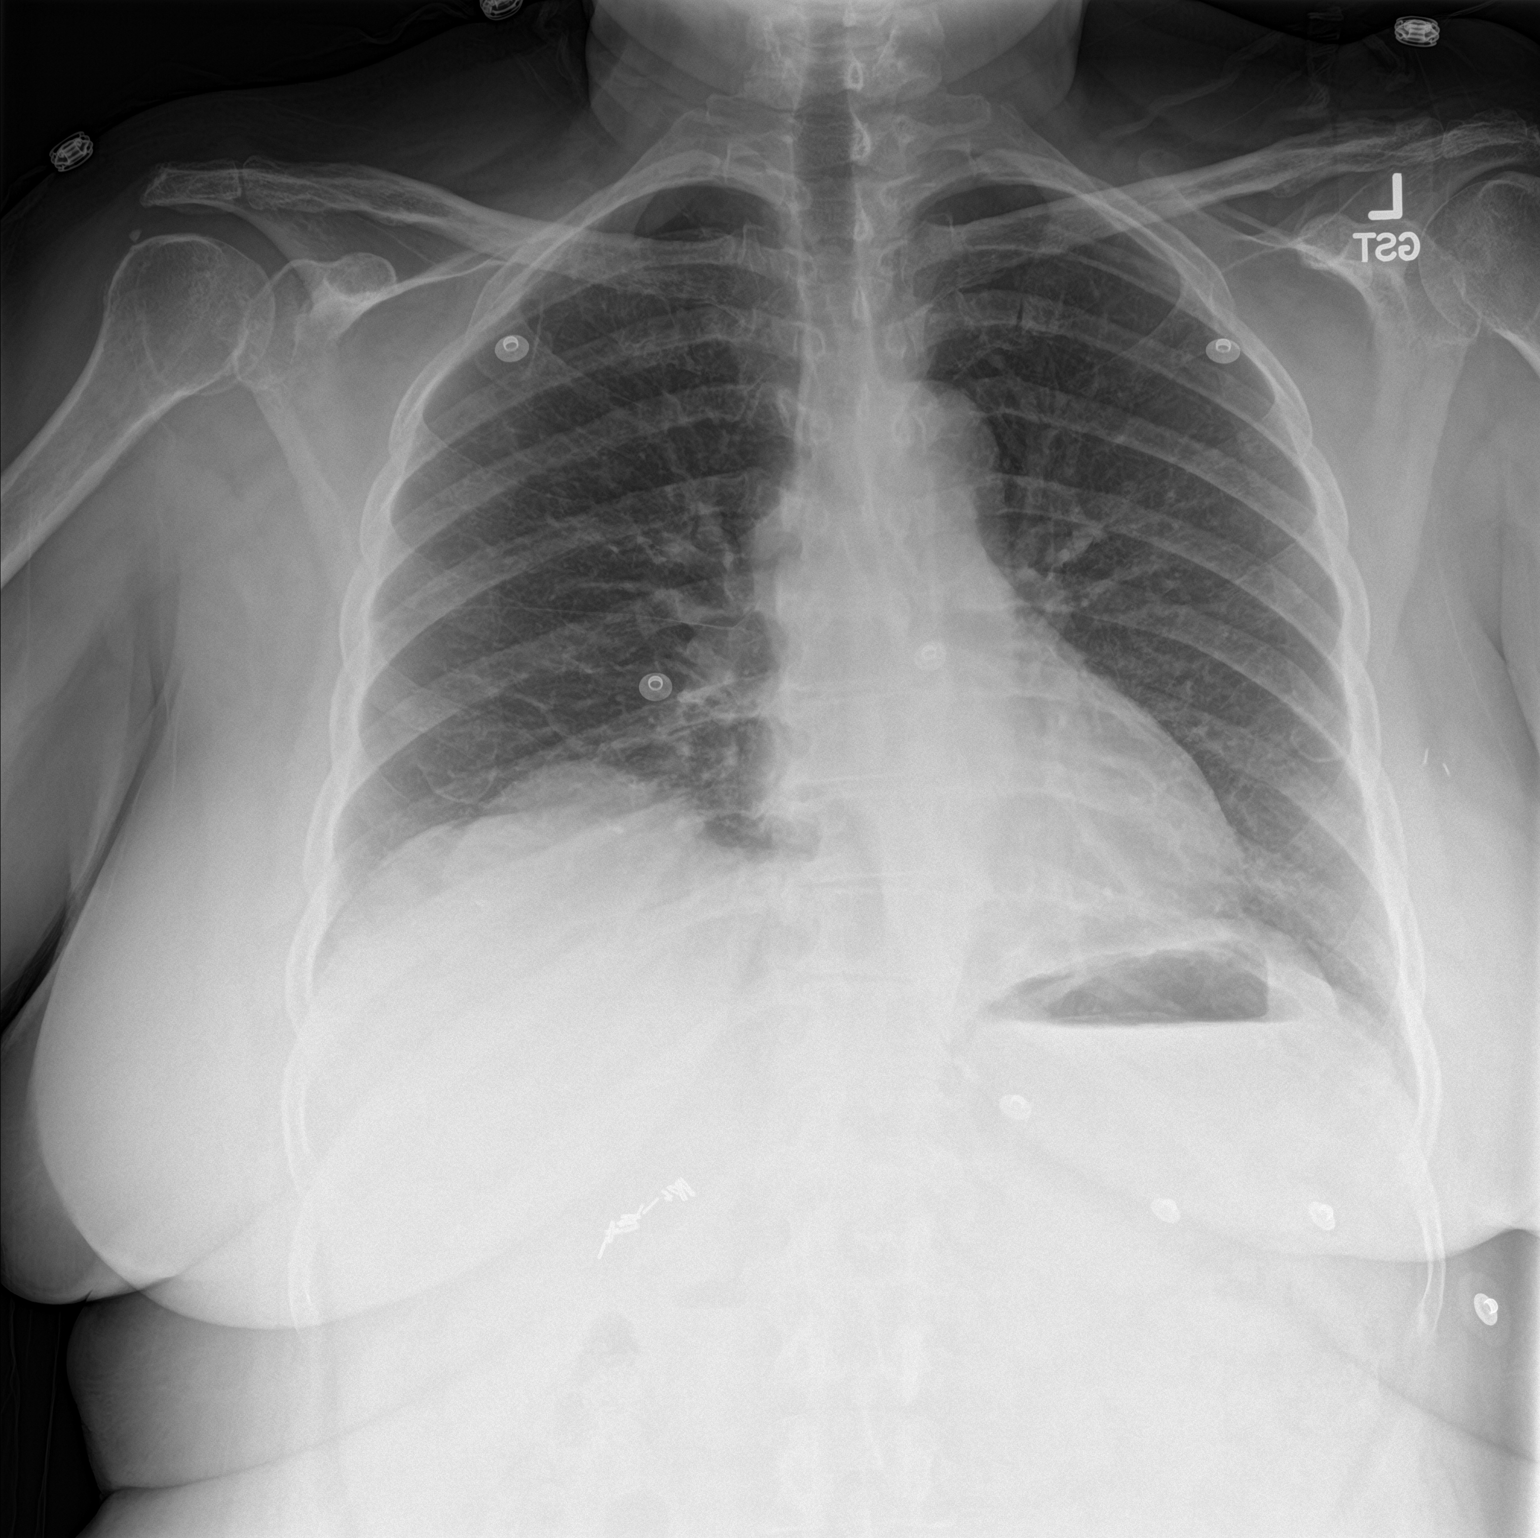

[chest lat]
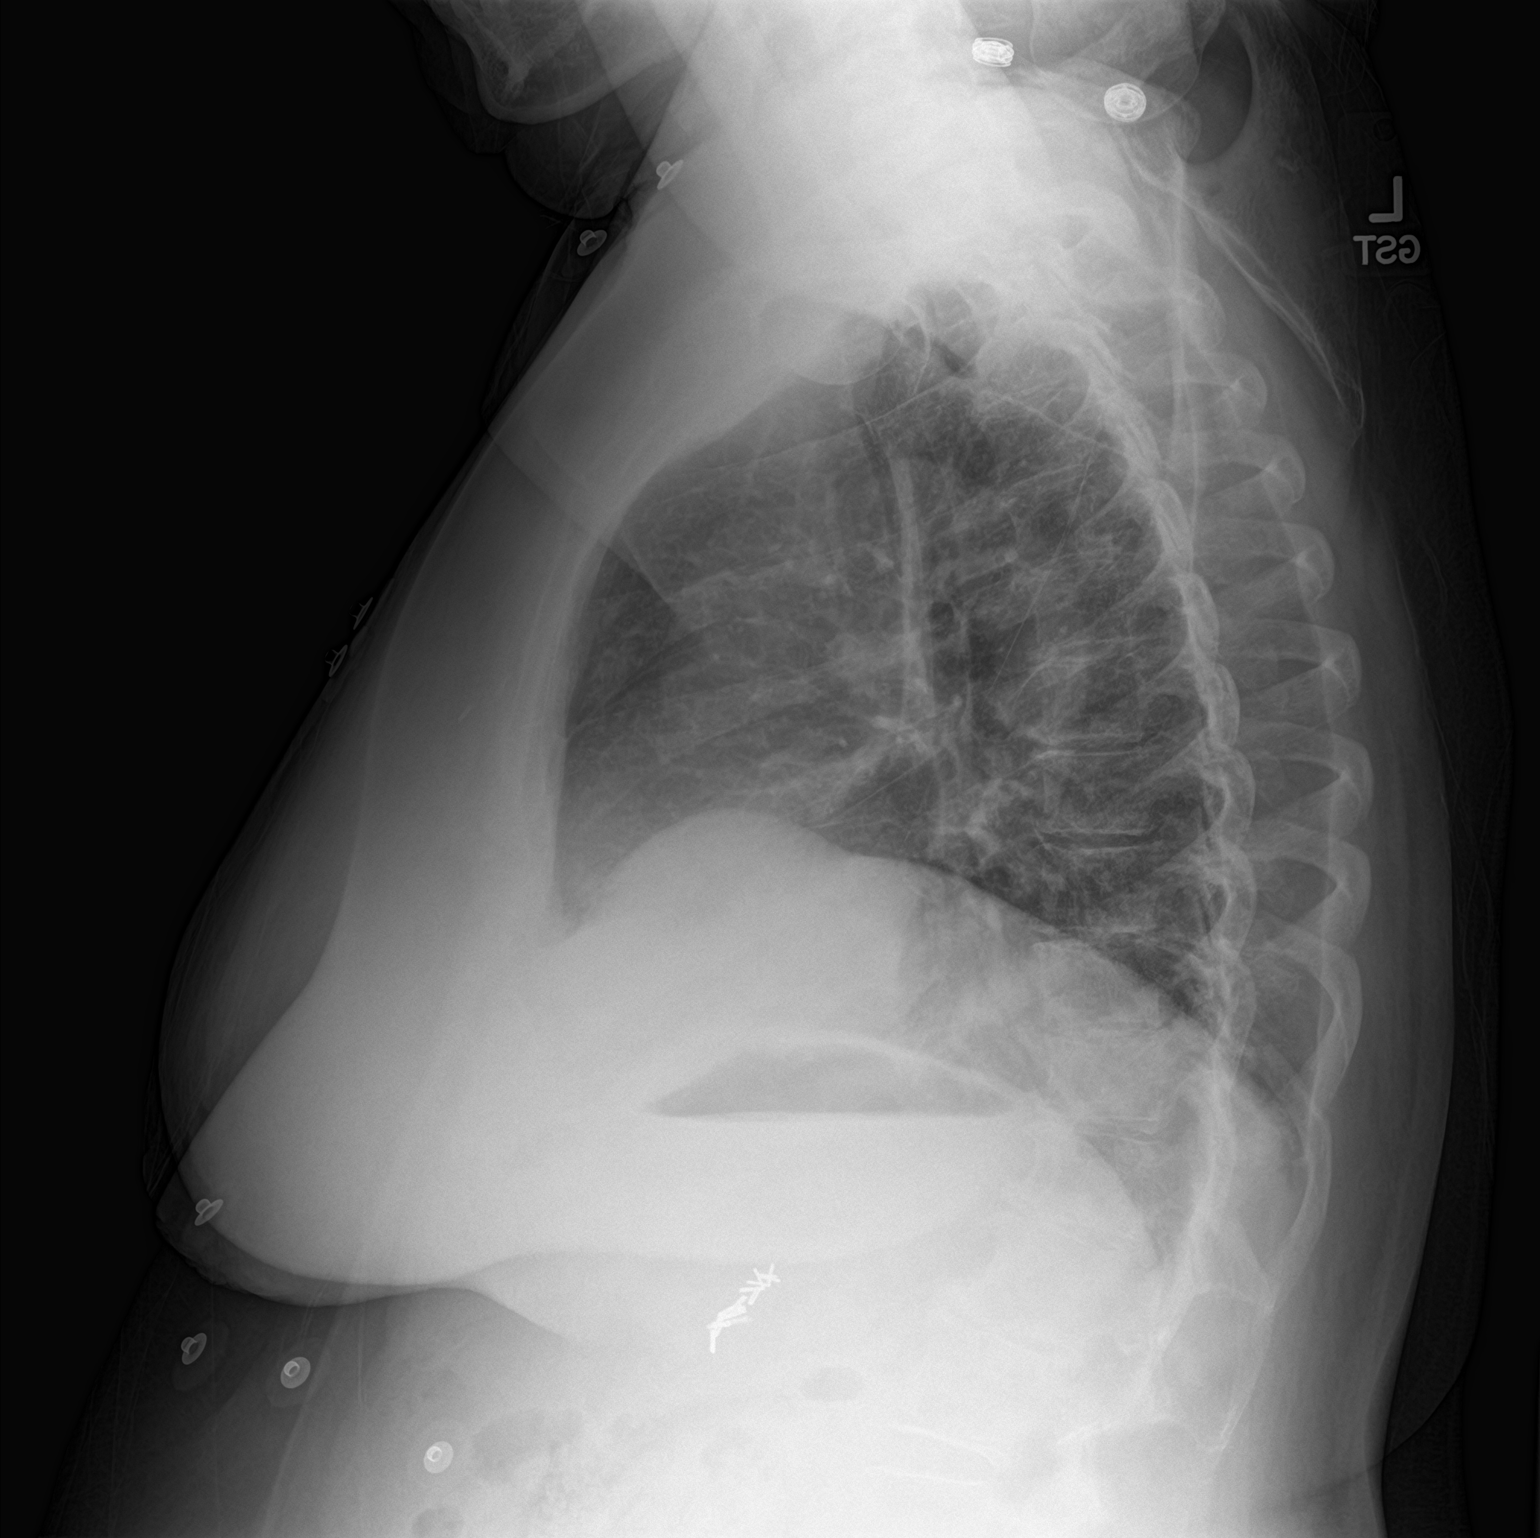

[2 of 2 positions shown; findings below may reference images not displayed]

FINDINGS: Stable mild eventration of the right hemidiaphragm. Stable lung
volumes. Normal cardiac size and mediastinal contours. Visualized
tracheal air column is within normal limits. No pneumothorax,
pulmonary edema, pleural effusion or confluent pulmonary opacity.
Stable cholecystectomy clips. No acute osseous abnormality
identified.
IMPRESSION: No acute cardiopulmonary abnormality.

## 2017-10-02 ENCOUNTER — Other Ambulatory Visit: Payer: Self-pay | Admitting: Cardiology

## 2017-10-03 NOTE — Telephone Encounter (Signed)
REFILL 

## 2017-11-08 ENCOUNTER — Other Ambulatory Visit: Payer: Self-pay | Admitting: Cardiology

## 2017-12-07 ENCOUNTER — Other Ambulatory Visit: Payer: Self-pay | Admitting: Cardiology

## 2017-12-07 ENCOUNTER — Telehealth: Payer: Self-pay | Admitting: Cardiology

## 2017-12-07 MED ORDER — LOSARTAN POTASSIUM 25 MG PO TABS: 25.0000 mg | ORAL_TABLET | Freq: Every day | ORAL | 0 refills | Status: AC

## 2017-12-07 NOTE — Telephone Encounter (Signed)
Pt want to know when she get her Losartin 25mg  why is she just getting 15 tablets and not more. Pt want to speak to nurse

## 2017-12-07 NOTE — Telephone Encounter (Signed)
Spoke with patient and she stated she was told by Dr Ellyn Hack she didn't need to come back and see him. Reviewed chart and office note said to follow up as needed. Explained to patient in order to continue to refill she will need to be seen yearly or she could get from her PCP. Patient is completely out and needs now. Refilled x 1 only and she will call PCP for additional refills. Advised if PCP would not fill to call back and get appointment scheduled.

## 2017-12-15 ENCOUNTER — Other Ambulatory Visit: Payer: Self-pay | Admitting: Obstetrics and Gynecology

## 2017-12-15 DIAGNOSIS — Z853 Personal history of malignant neoplasm of breast: Secondary | ICD-10-CM

## 2018-01-27 ENCOUNTER — Ambulatory Visit
Admission: RE | Admit: 2018-01-27 | Discharge: 2018-01-27 | Disposition: A | Discharge status: Home / Self Care | Payer: BC Managed Care – PPO | Source: Ambulatory Visit | Attending: Obstetrics and Gynecology | Admitting: Obstetrics and Gynecology

## 2018-01-27 DIAGNOSIS — Z853 Personal history of malignant neoplasm of breast: Secondary | ICD-10-CM

## 2018-09-26 ENCOUNTER — Encounter: Payer: Self-pay | Admitting: Neurology

## 2018-12-01 ENCOUNTER — Ambulatory Visit: Payer: BC Managed Care – PPO | Admitting: Neurology

## 2019-01-08 ENCOUNTER — Other Ambulatory Visit: Payer: Self-pay | Admitting: Obstetrics and Gynecology

## 2019-01-08 DIAGNOSIS — Z853 Personal history of malignant neoplasm of breast: Secondary | ICD-10-CM

## 2019-01-30 ENCOUNTER — Ambulatory Visit
Admission: RE | Admit: 2019-01-30 | Discharge: 2019-01-30 | Disposition: A | Discharge status: Home / Self Care | Payer: BC Managed Care – PPO | Source: Ambulatory Visit | Attending: Obstetrics and Gynecology | Admitting: Obstetrics and Gynecology

## 2019-01-30 ENCOUNTER — Other Ambulatory Visit: Payer: Self-pay

## 2019-01-30 DIAGNOSIS — Z853 Personal history of malignant neoplasm of breast: Secondary | ICD-10-CM

## 2019-03-14 ENCOUNTER — Other Ambulatory Visit: Payer: Self-pay

## 2019-03-14 DIAGNOSIS — Z20822 Contact with and (suspected) exposure to covid-19: Secondary | ICD-10-CM

## 2019-03-16 LAB — NOVEL CORONAVIRUS, NAA: SARS-CoV-2, NAA: NOT DETECTED

## 2019-06-04 ENCOUNTER — Other Ambulatory Visit: Payer: Self-pay

## 2019-06-04 DIAGNOSIS — Z20822 Contact with and (suspected) exposure to covid-19: Secondary | ICD-10-CM

## 2019-06-05 LAB — NOVEL CORONAVIRUS, NAA: SARS-CoV-2, NAA: NOT DETECTED

## 2019-11-08 DIAGNOSIS — Z1211 Encounter for screening for malignant neoplasm of colon: Secondary | ICD-10-CM | POA: Diagnosis not present

## 2019-11-08 DIAGNOSIS — K6389 Other specified diseases of intestine: Secondary | ICD-10-CM | POA: Diagnosis not present

## 2019-11-08 DIAGNOSIS — K219 Gastro-esophageal reflux disease without esophagitis: Secondary | ICD-10-CM | POA: Diagnosis not present

## 2019-11-19 ENCOUNTER — Ambulatory Visit: Payer: Medicare PPO | Admitting: Neurology

## 2019-11-19 ENCOUNTER — Encounter: Payer: Self-pay | Admitting: Neurology

## 2019-11-19 ENCOUNTER — Other Ambulatory Visit: Payer: Self-pay

## 2019-11-19 VITALS — BP 116/74 | HR 78 | Temp 96.7°F | Ht 63.0 in | Wt 188.0 lb

## 2019-11-19 DIAGNOSIS — M79661 Pain in right lower leg: Secondary | ICD-10-CM | POA: Diagnosis not present

## 2019-11-19 DIAGNOSIS — G629 Polyneuropathy, unspecified: Secondary | ICD-10-CM | POA: Diagnosis not present

## 2019-11-19 DIAGNOSIS — M791 Myalgia, unspecified site: Secondary | ICD-10-CM

## 2019-11-19 DIAGNOSIS — M79662 Pain in left lower leg: Secondary | ICD-10-CM

## 2019-11-19 MED ORDER — GABAPENTIN 300 MG PO CAPS: 300.0000 mg | ORAL_CAPSULE | Freq: Three times a day (TID) | ORAL | 11 refills | Status: DC | PRN

## 2019-11-19 NOTE — Progress Notes (Addendum)
[facility]    Provider:  Dr Jaynee Eagles Requesting Provider: Deland Pretty, MD Primary Care Provider:  Holland Commons, FNP  CC:  pain  HPI:  Dominique Dillon is a 64 y.o. female here as requested by Deland Pretty, MD for diabetic neuropathy.  Past medical history uncontrolled diabetes, essential hypertension, hypercholesteremia, asthma, obesity, degenerative disc disease cervical, GERD, depression, diabetic autonomic neuropathy associated with type 2 diabetes mellitus, history of malignant neoplasm of left female breast, osteoarthritis, fibromyalgia.  I reviewed referral which states she was sent here for pain management which unfortunately we do do not provide pain management, but we can evaluate her neuropathy and see if there is any other etiology that we can address.  It appears she was given Cymbalta samples, I reviewed labs which included BUN 12, AST 14, ALT 22, alk phos 137, creatinine 0.8, hemoglobin A1c 7.8 labs were taken September 20, 2019.  She says she is here because she has "pain all over". Her legs throb all the time especially all the time when she lays down, not burning, no numbness or tingling, no low back pain, they hurt n the calves and the back of the legs and below the knees, not in the feet, throbbing, no low back or shooting pain into the legs, both hurt in the same way but the right one hurts more, no numbness or tingling or pain in the feet. She had radiation and chemo and had some pain in the foot but that has resolved. She also has throbbing in the bilateral shoulders, no significant weakness, no neck pain more throbbing into the upper arms bilaterally and symmetrically, no numbness or tingling. At night sometimes she wakes up with numbness in the middle of the night in the left hand and similar to symptoms in the right hand when she had CTS. Been going on for several years ad getting worse. She has not been to physical therapy for this. It throbs more at  night when she lays down, it wakes her up and putting a pillow underneath helps. She has had some shots in her knees and it may be in the knees. Meloxicam helped. It sometimes interferes with her walking. aching muscles. No other focal neurologic deficits, associated symptoms, inciting events or modifiable factors.  Reviewed notes, labs and imaging from outside physicians, which showed:  Personally reviewed images 2010 MRi lumbar spine and agree with the following:   IMPRESSION:   1.  Negative for marked central canal or foraminal stenosis. 2.  Advanced facet degenerative disease at L4-5 with bilateral facet joint effusions results in mild anterolisthesis..  Review of Systems: Patient complains of symptoms per HPI as well as the following symptoms" muscle and joint pain. Pertinent negatives and positives per HPI. All others negative.   Social History   Socioeconomic History  . Marital status: Widowed    Spouse name: Not on file  . Number of children: 2  . Years of education: 1  yr of college  . Highest education level: Not on file  Occupational History  . Not on file  Tobacco Use  . Smoking status: Never Smoker  . Smokeless tobacco: Never Used  Substance and Sexual Activity  . Alcohol use: No  . Drug use: No  . Sexual activity: Not on file  Other Topics Concern  . Not on file  Social History Narrative   Lives at home alone   Right handed   Caffeine: coffee 1 cup/day   Social Determinants of Health  Financial Resource Strain:   . Difficulty of Paying Living Expenses:   Food Insecurity:   . Worried About Charity fundraiser in the Last Year:   . Arboriculturist in the Last Year:   Transportation Needs:   . Film/video editor (Medical):   Marland Kitchen Lack of Transportation (Non-Medical):   Physical Activity:   . Days of Exercise per Week:   . Minutes of Exercise per Session:   Stress:   . Feeling of Stress :   Social Connections:   . Frequency of Communication with  Friends and Family:   . Frequency of Social Gatherings with Friends and Family:   . Attends Religious Services:   . Active Member of Clubs or Organizations:   . Attends Archivist Meetings:   Marland Kitchen Marital Status:   Intimate Partner Violence:   . Fear of Current or Ex-Partner:   . Emotionally Abused:   Marland Kitchen Physically Abused:   . Sexually Abused:     Family History  Problem Relation Age of Onset  . Diabetes Mother   . Prostate cancer Father   . Stomach cancer Maternal Aunt   . Pancreatic cancer Maternal Uncle   . Lung cancer Maternal Grandmother   . Breast cancer Maternal Aunt   . Pancreatic cancer Maternal Aunt   . Pancreatic cancer Maternal Uncle   . Heart attack Maternal Grandfather     Past Medical History:  Diagnosis Date  . Allergic rhinitis   . Arthritis    knees  . Breast cancer of upper-outer quadrant of left female breast ([place]) 09/12/2014  . DDD (degenerative disc disease), cervical   . Depression   . GERD (gastroesophageal reflux disease)   . History of anemia    still takes iron supplement  . Hypertension    under control with med., has been on med. since 1990s  . Insulin dependent diabetes mellitus   . Obesity   . Personal history of chemotherapy   . Personal history of radiation therapy   . Seasonal asthma    no current med.    Patient Active Problem List   Diagnosis Date Noted  . Dyslipidemia, goal LDL below 100 08/29/2015  . Metabolic syndrome 36/14/4315  . Chest pain with moderate risk for cardiac etiology 05/28/2015  . Right carotid bruit 05/28/2015  . Intermittent claudication ([place]) 05/28/2015  . Hypoxia 05/20/2015  . Hypotension 05/20/2015  . Chest pain 05/20/2015  . Hypokalemia 05/20/2015  . Prolonged QT interval 05/20/2015  . Syncope 05/20/2015  . Chest pain at rest 05/20/2015  . Obesity, Class II, BMI 35-39.9   . Medication adverse effect   . Thalassemia 03/05/2015  . Seasonal allergies 02/08/2015  . Cancer associated pain  02/08/2015  . Glaucoma 12/31/2014  . Breast cancer of upper-outer quadrant of left female breast ([place]) 09/12/2014  . Abscess of abdominal wall s/p I&D 7/30- & 03/08/2014 03/08/2014  . Cellulitis 03/08/2014  . Diabetes mellitus type 2, controlled ([place]) 03/08/2014  . Essential hypertension, benign 03/08/2014  . GERD (gastroesophageal reflux disease) 03/08/2014  . Anemia in neoplastic disease 03/08/2014    Past Surgical History:  Procedure Laterality Date  . ABDOMINAL HYSTERECTOMY  1995   complete  . APPENDECTOMY  1980  . BREAST BIOPSY Left 10/15/2015  . BREAST BIOPSY Left 09/26/2015  . BREAST BIOPSY Left 09/09/2014  . BREAST LUMPECTOMY Left 10/01/2014  . CARPAL TUNNEL RELEASE Right 08/06/2004  . CESAREAN SECTION     x 2  . CHOLECYSTECTOMY  1980  . COLONOSCOPY    . exploratory surgery of abdomen  11/1978  . IRRIGATION AND DEBRIDEMENT ABSCESS N/A 03/08/2014   Procedure: IRRIGATION AND DEBRIDEMENT ABDOMINAL WALL ABSCESS;  Surgeon: Adin Hector, MD;  Location: [facility];  Service: General;  Laterality: N/A;  . KNEE ARTHROSCOPY Bilateral   . NM MYOVIEW LTD  06/2015   LOW RISK. HTN response to exercise. No EKG changes. Small, partially reversible apical defect (probably Breast Attenuation, but CRO apical Ischemia).  EF 63% with no RWMA  . polyp removed from L breast    . PORTACATH PLACEMENT Right 10/21/2014   Procedure: INSERTION PORT-A-CATH;  Surgeon: Rolm Bookbinder, MD;  Location: [facility];  Service: General;  Laterality: Right;  . RADIOACTIVE SEED GUIDED PARTIAL MASTECTOMY WITH AXILLARY SENTINEL LYMPH NODE BIOPSY Left 10/01/2014   Procedure: RADIOACTIVE SEED GUIDED LEFT BREAST LUMPECTOMY WITH LEFT  AXILLARY SENTINEL LYMPH NODE BIOPSY;  Surgeon: Rolm Bookbinder, MD;  Location: [place] [facility];  Service: General;  Laterality: Left;  . RE-EXCISION OF BREAST LUMPECTOMY Left 10/21/2014   Procedure: RE-EXCISION OF BREAST CANCER, POSTERIOR MARGINS;  Surgeon:  Rolm Bookbinder, MD;  Location: [facility];  Service: General;  Laterality: Left;  . TONSILLECTOMY  1980  . TRIGGER FINGER RELEASE Right 08/06/2004   middle finger  . UPPER GI ENDOSCOPY      Current Outpatient Medications  Medication Sig Dispense Refill  . aspirin 81 MG tablet Take 81 mg by mouth daily.    . cetirizine (ZYRTEC) 10 MG tablet Take 10 mg by mouth daily.    . DULoxetine (CYMBALTA) 30 MG capsule Take 60 mg by mouth daily.    . hydrochlorothiazide (HYDRODIURIL) 25 MG tablet Take 25 mg by mouth daily.    . insulin aspart (NOVOLOG FLEXPEN) 100 UNIT/ML FlexPen Inject 20 Units into the skin with breakfast, with lunch, and with evening meal. Before carb meals.    Marland Kitchen losartan (COZAAR) 25 MG tablet Take 1 tablet (25 mg total) by mouth daily. PLEASE SCHEDULE OFFICE VISIT FOR REFILLS OR GET FROM PCP 15 tablet 0  . lovastatin (MEVACOR) 40 MG tablet Take 40 mg by mouth daily.    . metFORMIN (GLUCOPHAGE-XR) 500 MG 24 hr tablet Take 2 tablets by mouth in the morning and at bedtime.    . Multiple Vitamins-Minerals (CENTRUM WOMEN PO) Take 1 each by mouth daily.    . Omega-3 Fatty Acids (FISH OIL PO) Take 3 each by mouth daily.     Marland Kitchen OZEMPIC, 1 MG/DOSE, 2 MG/1.5ML SOPN Inject 1 mg into the skin. On Wednesdays.    . pantoprazole (PROTONIX) 40 MG tablet Take 40 mg by mouth daily.    Tyler Aas FLEXTOUCH 100 UNIT/ML FlexTouch Pen Inject 28 Units into the skin daily.    Marland Kitchen gabapentin (NEURONTIN) 300 MG capsule Take 1 capsule (300 mg total) by mouth 3 (three) times daily as needed. 90 capsule 11   No current facility-administered medications for this visit.    Allergies as of 11/19/2019 - Review Complete 11/19/2019  Allergen Reaction Noted  . Crestor [rosuvastatin]  11/19/2019  . Humalog [insulin lispro]  11/19/2019  . Lipitor [atorvastatin]  11/19/2019  . Macrobid [nitrofurantoin monohyd macro] Hives and Itching 05/20/2015  . Biaxin [clarithromycin] Other (See Comments)  06/20/2012    Vitals: BP 116/74 (BP Location: Right Arm, Patient Position: Sitting)   Pulse 78   Temp (!) 96.7 F (35.9 C) Comment: taken at front  Ht 5' 3" (1.6 m)  Wt 188 lb (85.3 kg)   BMI 33.30 kg/m  Last Weight:  Wt Readings from Last 1 Encounters:  11/19/19 188 lb (85.3 kg)   Last Height:   Ht Readings from Last 1 Encounters:  11/19/19 5' 3" (1.6 m)     Physical exam: Exam: Gen: NAD, conversant, well nourised, obese, well groomed                     CV: RRR, no MRG. No Carotid Bruits. No peripheral edema, warm, nontender Eyes: Conjunctivae clear without exudates or hemorrhage  Neuro: Detailed Neurologic Exam  Speech:    Speech is normal; fluent and spontaneous with normal comprehension.  Cognition:    The patient is oriented to person, place, and time;     recent and remote memory intact;     language fluent;     normal attention, concentration,     fund of knowledge Cranial Nerves:    The pupils are equal, round, and reactive to light. The fundi are flat. Visual fields are full to finger confrontation. Extraocular movements are intact. Trigeminal sensation is intact and the muscles of mastication are normal. The face is symmetric. The palate elevates in the midline. Hearing intact. Voice is normal. Shoulder shrug is normal. The tongue has normal motion without fasciculations.   Coordination:    Normal finger to nose and heel to shin.   Gait:    Slightly antalgic  Motor Observation:    No asymmetry, no atrophy, and no involuntary movements noted. Tone:    Normal muscle tone.    Posture:    Posture is normal. normal erect    Strength: some minimal weakness/giveway right leg moreso due to pain from her knee but overall strength is V/V in the upper and lower limbs.      Sensation: decreased sensation to pinprick distally, intact vibration and proprioception     Reflex Exam:  DTR's: patellars 2+ and Ajs trace(with distraction), biceps 1+        Toes:    The toes are downgoing bilaterally.   Clonus:    Clonus is absent.    Assessment/Plan:  64 y.o. female here as requested by Deland Pretty, MD for diabetic neuropathy.  Past medical history uncontrolled diabetes, essential hypertension, hypercholesteremia, asthma, obesity, degenerative disc disease cervical, GERD, depression, diabetic autonomic neuropathy associated with type 2 diabetes mellitus, history of malignant neoplasm of left female breast, osteoarthritis, fibromyalgia.  I reviewed referral which states she was sent here for pain management which unfortunately we do do not provide pain management, but we can evaluate her symptoms and see if there is any other etiology that we can address.  In any case her symptoms do not appear to be diabetic neuropathy, she denies any numbnes or tingling, it is more muscle and joint pain in the calfs and in the shoulders without low back or neck pain. She has pain on palpation of the shoulders and knees. Minimal distal decrease in pin prick but no significant sensory loss. Strength largely intact except where pain interferes. We can check some labs as well as an emg/ncs to ensure there is no muscle disease causing her pain, such as polymyalgia rheumatica, make sure CK is not elevated, B12 and some others.   - right calf throbbing from behind the knee, can check to see if there is a DVT - Bloodwork - meloxican helps, can continue - will give Gabapentin prn and see if it helps - emg/ncs - if  no etiology found she can return back to primary care   Orders Placed This Encounter  Procedures  . CK  . B12 and Folate Panel  . Methylmalonic acid, serum  . TSH  . Rheumatoid factor  . Heavy metals, blood  . Vitamin B6  . Multiple Myeloma Panel (SPEP&IFE w/QIG)  . Vitamin B1  . ANA, IFA (with reflex)  . CBC  . Comprehensive metabolic panel  . Sedimentation rate  . NCV with EMG(electromyography)   Meds ordered this encounter  Medications  .  gabapentin (NEURONTIN) 300 MG capsule    Sig: Take 1 capsule (300 mg total) by mouth 3 (three) times daily as needed.    Dispense:  90 capsule    Refill:  11    Cc: Deland Pretty, MD,  Holland Commons, FNP  Sarina Ill, [facility] [address] [place], [place] [address]  Phone [phone] Fax [phone]

## 2019-11-19 NOTE — Addendum Note (Signed)
Addended by: Sarina Ill B on: 11/19/2019 08:21 AM   Modules accepted: Orders

## 2019-11-19 NOTE — Patient Instructions (Addendum)
Blood work Orthoptist is a test to check how well your muscles and nerves are working. This procedure includes the combined use of electromyogram (EMG) and nerve conduction study (NCS). EMG is used to look for muscular disorders. NCS, which is also called electroneurogram, measures how well your nerves are controlling your muscles. The procedures are usually done together to check if your muscles and nerves are healthy. If the results of the tests are abnormal, this may indicate disease or injury, such as a neuromuscular disease or peripheral nerve damage. Tell a health care provider about:  Any allergies you have.  All medicines you are taking, including vitamins, herbs, eye drops, creams, and over-the-counter medicines.  Any problems you or family members have had with anesthetic medicines.  Any blood disorders you have.  Any surgeries you have had.  Any medical conditions you have.  If you have a pacemaker.  Whether you are pregnant or may be pregnant. What are the risks? Generally, this is a safe procedure. However, problems may occur, including:  Infection where the electrodes were inserted.  Bleeding. What happens before the procedure? Medicines Ask your health care provider about:  Changing or stopping your regular medicines. This is especially important if you are taking diabetes medicines or blood thinners.  Taking medicines such as aspirin and ibuprofen. These medicines can thin your blood. Do not take these medicines unless your health care provider tells you to take them.  Taking over-the-counter medicines, vitamins, herbs, and supplements. General instructions  Your health care provider may ask you to avoid: ? Beverages that have caffeine, such as coffee and tea. ? Any products that contain nicotine or tobacco. These products include cigarettes, e-cigarettes, and chewing tobacco. If you need help quitting, ask your health  care provider.  Do not use lotions or creams on the same day that you will be having the procedure. What happens during the procedure? For EMG   Your health care provider will ask you to stay in a position so that he or she can access the muscle that will be studied. You may be standing, sitting, or lying down.  You may be given a medicine that numbs the area (local anesthetic).  A very thin needle that has an electrode will be inserted into your muscle.  Another small electrode will be placed on your skin near the muscle.  Your health care provider will ask you to continue to remain still.  The electrodes will send a signal that tells about the electrical activity of your muscles. You may see this on a monitor or hear it in the room.  After your muscles have been studied at rest, your health care provider will ask you to contract or flex your muscles. The electrodes will send a signal that tells about the electrical activity of your muscles.  Your health care provider will remove the electrodes and the electrode needles when the procedure is finished. The procedure may vary among health care providers and hospitals. For NCS   An electrode that records your nerve activity (recording electrode) will be placed on your skin by the muscle that is being studied.  An electrode that is used as a reference (reference electrode) will be placed near the recording electrode.  A paste or gel will be applied to your skin between the recording electrode and the reference electrode.  Your nerve will be stimulated with a mild shock. Your health care provider will measure how much time it takes  for your muscle to react.  Your health care provider will remove the electrodes and the gel when the procedure is finished. The procedure may vary among health care providers and hospitals. What happens after the procedure?  It is up to you to get the results of your procedure. Ask your health care  provider, or the department that is doing the procedure, when your results will be ready.  Your health care provider may: ? Give you medicines for any pain. ? Monitor the insertion sites to make sure that bleeding stops. Summary  Electromyoneurogram is a test to check how well your muscles and nerves are working.  If the results of the tests are abnormal, this may indicate disease or injury.  This is a safe procedure. However, problems may occur, such as bleeding and infection.  Your health care provider will do two tests to complete this procedure. One checks your muscles (EMG) and another checks your nerves (NCS).  It is up to you to get the results of your procedure. Ask your health care provider, or the department that is doing the procedure, when your results will be ready. This information is not intended to replace advice given to you by your health care provider. Make sure you discuss any questions you have with your health care provider. Document Revised: 04/11/2018 Document Reviewed: 03/24/2018 Elsevier Patient Education  [place].  Gabapentin capsules or tablets What is this medicine? GABAPENTIN (GA ba pen tin) is used to control seizures in certain types of epilepsy. It is also used to treat certain types of nerve pain. This medicine may be used for other purposes; ask your health care provider or pharmacist if you have questions. COMMON BRAND NAME(S): Active-PAC with Gabapentin, Gabarone, Neurontin What should I tell my health care provider before I take this medicine? They need to know if you have any of these conditions:  history of drug abuse or alcohol abuse problem  kidney disease  lung or breathing disease  suicidal thoughts, plans, or attempt; a previous suicide attempt by you or a family member  an unusual or allergic reaction to gabapentin, other medicines, foods, dyes, or preservatives  pregnant or trying to get pregnant  breast-feeding How  should I use this medicine? Take this medicine by mouth with a glass of water. Follow the directions on the prescription label. You can take it with or without food. If it upsets your stomach, take it with food. Take your medicine at regular intervals. Do not take it more often than directed. Do not stop taking except on your doctor's advice. If you are directed to break the 600 or 800 mg tablets in half as part of your dose, the extra half tablet should be used for the next dose. If you have not used the extra half tablet within 28 days, it should be thrown away. A special MedGuide will be given to you by the pharmacist with each prescription and refill. Be sure to read this information carefully each time. Talk to your pediatrician regarding the use of this medicine in children. While this drug may be prescribed for children as young as 3 years for selected conditions, precautions do apply. Overdosage: If you think you have taken too much of this medicine contact a poison control center or emergency room at once. NOTE: This medicine is only for you. Do not share this medicine with others. What if I miss a dose? If you miss a dose, take it as soon as you  can. If it is almost time for your next dose, take only that dose. Do not take double or extra doses. What may interact with this medicine? This medicine may interact with the following medications:  alcohol  antihistamines for allergy, cough, and cold  certain medicines for anxiety or sleep  certain medicines for depression like amitriptyline, fluoxetine, sertraline  certain medicines for seizures like phenobarbital, primidone  certain medicines for stomach problems  general anesthetics like halothane, isoflurane, methoxyflurane, propofol  local anesthetics like lidocaine, pramoxine, tetracaine  medicines that relax muscles for surgery  narcotic medicines for pain  phenothiazines like chlorpromazine, mesoridazine, prochlorperazine,  thioridazine This list may not describe all possible interactions. Give your health care provider a list of all the medicines, herbs, non-prescription drugs, or dietary supplements you use. Also tell them if you smoke, drink alcohol, or use illegal drugs. Some items may interact with your medicine. What should I watch for while using this medicine? Visit your doctor or health care provider for regular checks on your progress. You may want to keep a record at home of how you feel your condition is responding to treatment. You may want to share this information with your doctor or health care provider at each visit. You should contact your doctor or health care provider if your seizures get worse or if you have any new types of seizures. Do not stop taking this medicine or any of your seizure medicines unless instructed by your doctor or health care provider. Stopping your medicine suddenly can increase your seizures or their severity. This medicine may cause serious skin reactions. They can happen weeks to months after starting the medicine. Contact your health care provider right away if you notice fevers or flu-like symptoms with a rash. The rash may be red or purple and then turn into blisters or peeling of the skin. Or, you might notice a red rash with swelling of the face, lips or lymph nodes in your neck or under your arms. Wear a medical identification bracelet or chain if you are taking this medicine for seizures, and carry a card that lists all your medications. You may get drowsy, dizzy, or have blurred vision. Do not drive, use machinery, or do anything that needs mental alertness until you know how this medicine affects you. To reduce dizzy or fainting spells, do not sit or stand up quickly, especially if you are an older patient. Alcohol can increase drowsiness and dizziness. Avoid alcoholic drinks. Your mouth may get dry. Chewing sugarless gum or sucking hard candy, and drinking plenty of water  will help. The use of this medicine may increase the chance of suicidal thoughts or actions. Pay special attention to how you are responding while on this medicine. Any worsening of mood, or thoughts of suicide or dying should be reported to your health care provider right away. Women who become pregnant while using this medicine may enroll in the [place] Pregnancy Registry by calling [phone]. This registry collects information about the safety of antiepileptic drug use during pregnancy. What side effects may I notice from receiving this medicine? Side effects that you should report to your doctor or health care professional as soon as possible:  allergic reactions like skin rash, itching or hives, swelling of the face, lips, or tongue  breathing problems  rash, fever, and swollen lymph nodes  redness, blistering, peeling or loosening of the skin, including inside the mouth  suicidal thoughts, mood changes Side effects that usually do  not require medical attention (report to your doctor or health care professional if they continue or are bothersome):  dizziness  drowsiness  headache  nausea, vomiting  swelling of ankles, feet, hands  tiredness This list may not describe all possible side effects. Call your doctor for medical advice about side effects. You may report side effects to FDA at 1-800-FDA-1088. Where should I keep my medicine? Keep out of reach of children. This medicine may cause accidental overdose and death if it taken by other adults, children, or pets. Mix any unused medicine with a substance like cat litter or coffee grounds. Then throw the medicine away in a sealed container like a sealed bag or a coffee can with a lid. Do not use the medicine after the expiration date. Store at room temperature between 15 and 30 degrees C (59 and 86 degrees F). NOTE: This sheet is a summary. It may not cover all possible information. If you have  questions about this medicine, talk to your doctor, pharmacist, or health care provider.  2020 Elsevier/Gold Standard (2018-10-27 14:16:43)

## 2019-11-19 NOTE — Addendum Note (Signed)
Addended by: Sarina Ill B on: 11/19/2019 08:16 AM   Modules accepted: Orders

## 2019-11-19 NOTE — Addendum Note (Signed)
Addended by: Sarina Ill B on: 11/19/2019 08:17 AM   Modules accepted: Orders

## 2019-11-19 NOTE — Addendum Note (Signed)
Addended by: Gildardo Griffes on: 11/19/2019 08:45 AM   Modules accepted: Orders

## 2019-11-20 ENCOUNTER — Telehealth: Payer: Self-pay | Admitting: *Deleted

## 2019-11-20 ENCOUNTER — Ambulatory Visit (HOSPITAL_COMMUNITY)
Admission: RE | Admit: 2019-11-20 | Discharge: 2019-11-20 | Disposition: A | Discharge status: Home / Self Care | Payer: Medicare PPO | Source: Ambulatory Visit | Attending: Neurology | Admitting: Neurology

## 2019-11-20 ENCOUNTER — Other Ambulatory Visit: Payer: Self-pay

## 2019-11-20 DIAGNOSIS — M79662 Pain in left lower leg: Secondary | ICD-10-CM | POA: Diagnosis not present

## 2019-11-20 DIAGNOSIS — M79661 Pain in right lower leg: Secondary | ICD-10-CM | POA: Diagnosis not present

## 2019-11-20 DIAGNOSIS — M791 Myalgia, unspecified site: Secondary | ICD-10-CM | POA: Diagnosis not present

## 2019-11-20 NOTE — Telephone Encounter (Signed)
Tried to reach pt to discuss results. VM full. Mychart message sent. Results forwarded to PCP.

## 2019-11-20 NOTE — Telephone Encounter (Signed)
-----   Message from Melvenia Beam, MD sent at 11/20/2019  9:49 AM EDT ----- No DVTs, but they did see a "cystic structure" at the back of the knees which may be causing her discomfort I would follow up with primary care about this and forward the report to primary care thanks, please let patient know.

## 2019-11-25 NOTE — Progress Notes (Signed)
History: 64 year old patient with a history of uncontrolled diabetes, obesity, diabetic autonomic neuropathy, fibromyalgia here for "pain all over".  Her legs throb all the time especially when she lays down but she denies any sensory changes in the feet (not burning, no numbness or tingling, no low back pain or radicular symptoms) mostly pain behind the knees and in the calves not in the feet.  She also has throbbing in the bilateral shoulders, no significant weakness, no significant neck pain or radicular symptoms, and she reports at night sometimes she wakes up with numbness in the middle of the night with the left hand similar to symptoms in the right hand when she had CTS ongoing several years and worsening  - I reviewed findings with patient, she has carpal tunnel syndrome bilaterally electrodiagnostically worse on the left; she had carpal tunnel release surgery on the right in the 1990s so unclear if changes are remote or current in the right hand but symptoms appear new on the left.  - Also seen was some mild sensory neuropathy not unexpected due to her uncontrolled diabetes and hx of chemotherapy however this appears asymptomatic as these results do not explain the pain complaints in her throbbing calves without sensory symptoms and she denies numbness or tingling in the feet.EMG showed muscle studies normal. - No cervical or lumbar radicular symptoms.  - ultrasound negative for DVTs bilaterally except bilateral cystic structures are found in the popliteal fossa which may be causing her pain that she describes behind the knee, follow up with pcp about this.  - For Bilateral carpal tunnel syndrome. Dr. Amedeo Plenty.  - We reviewed her labs all unremarkable (sed rate, cmp, cbc, ana, b1, mma, b6, heavy metals, RF, TSH, MMA, B12, folate, ck)  Orders Placed This Encounter  Procedures  . Ambulatory referral to Orthopedic Surgery     I spent 15 minutes of face-to-face and non-face-to-face time with patient  on the  1. Muscle pain   2. Neuropathy    diagnosis.  This included previsit chart review, lab review, study review, order entry, electronic health record documentation, patient education on the different diagnostic and therapeutic options, counseling and coordination of care, risks and benefits of management, compliance, or risk factor reduction.  This does not include time spent on EMG nerve conduction study.

## 2019-11-26 ENCOUNTER — Other Ambulatory Visit: Payer: Self-pay

## 2019-11-26 ENCOUNTER — Ambulatory Visit: Payer: Medicare PPO | Admitting: Neurology

## 2019-11-26 ENCOUNTER — Ambulatory Visit (INDEPENDENT_AMBULATORY_CARE_PROVIDER_SITE_OTHER): Payer: Medicare PPO | Admitting: Neurology

## 2019-11-26 DIAGNOSIS — Z0289 Encounter for other administrative examinations: Secondary | ICD-10-CM

## 2019-11-26 DIAGNOSIS — M791 Myalgia, unspecified site: Secondary | ICD-10-CM | POA: Diagnosis not present

## 2019-11-26 DIAGNOSIS — G5603 Carpal tunnel syndrome, bilateral upper limbs: Secondary | ICD-10-CM | POA: Diagnosis not present

## 2019-11-26 DIAGNOSIS — G629 Polyneuropathy, unspecified: Secondary | ICD-10-CM

## 2019-11-26 DIAGNOSIS — M79661 Pain in right lower leg: Secondary | ICD-10-CM

## 2019-11-26 DIAGNOSIS — M79662 Pain in left lower leg: Secondary | ICD-10-CM

## 2019-11-26 NOTE — Patient Instructions (Signed)
Dr. Amedeo Plenty for hands (Carpal Tunnel Syndrome) Follow up on popliteal cyst with primary care  Baker Cyst  A Baker cyst, also called a popliteal cyst, is a growth that forms at the back of the knee. The cyst forms when the fluid-filled sac (bursa) that cushions the knee joint becomes enlarged. What are the causes? In most cases, a Baker cyst results from another knee problem that causes swelling inside the knee. This makes the fluid inside the knee joint (synovial fluid) flow into the bursa behind the knee, causing the bursa to enlarge. What increases the risk? You may be more likely to develop a Baker cyst if you already have a knee problem, such as:  A tear in cartilage that cushions the knee joint (meniscal tear).  A tear in the tissues that connect the bones of the knee joint (ligament tear).  Knee swelling from osteoarthritis, rheumatoid arthritis, or gout. What are the signs or symptoms? The main symptom of this condition is a lump behind the knee. This may be the only symptom of the condition. The lump may be painful, especially when the knee is straightened. If the lump is painful, the pain may come and go. The knee may also be stiff. Symptoms may quickly get more severe if the cyst breaks open (ruptures). If the cyst ruptures, you may feel the following in your knee and calf:  Sudden or worsening pain.  Swelling.  Bruising.  Redness in the calf. A Baker cyst does not always cause symptoms. How is this diagnosed? This condition may be diagnosed based on your symptoms and medical history. Your health care provider will also do a physical exam. This may include:  Feeling the cyst to check whether it is tender.  Checking your knee for signs of another knee condition that causes swelling. You may have imaging tests, such as:  X-rays.  MRI.  Ultrasound. How is this treated? A Baker cyst that is not painful may go away without treatment. If the cyst gets large or painful, it  will likely get better if the underlying knee problem is treated. If needed, treatment for a Baker cyst may include:  Resting.  Keeping weight off of the knee. This means not leaning on the knee to support your body weight.  Taking NSAIDs, such as ibuprofen, to reduce pain and swelling.  Having a procedure to drain the fluid from the cyst with a needle (aspiration). You may also get an injection of a medicine that reduces swelling (steroid).  Having surgery. This may be needed if other treatments do not work. This usually involves correcting knee damage and removing the cyst. Follow these instructions at home:  Activity  Rest as told by your health care provider.  Avoid activities that make pain or swelling worse.  Return to your normal activities as told by your health care provider. Ask your health care provider what activities are safe for you.  Do not use the injured limb to support your body weight until your health care provider says that you can. Use crutches as told by your health care provider. General instructions  Take over-the-counter and prescription medicines only as told by your health care provider.  Keep all follow-up visits as told by your health care provider. This is important. Contact a health care provider if:  You have knee pain, stiffness, or swelling that does not get better. Get help right away if:  You have sudden or worsening pain and swelling in your calf area. Summary  A Baker cyst, also called a popliteal cyst, is a growth that forms at the back of the knee.  In most cases, a Baker cyst results from another knee problem that causes swelling inside the knee.  A Baker cyst that is not painful may go away without treatment.  If needed, treatment for a Baker cyst may include resting, keeping weight off of the knee, medicines, or draining fluid from the cyst.  Surgery may be needed if other treatments are not effective. This information is not  intended to replace advice given to you by your health care provider. Make sure you discuss any questions you have with your health care provider. Document Revised: 12/08/2018 Document Reviewed: 12/08/2018 Elsevier Patient Education  [place] tunnel syndrome is a condition that causes pain in your hand and arm. The carpal tunnel is a narrow area that is on the palm side of your wrist. Repeated wrist motion or certain diseases may cause swelling in the tunnel. This swelling can pinch the main nerve in the wrist (median nerve). What are the causes? This condition may be caused by:  Repeated wrist motions.  Wrist injuries.  Arthritis.  A sac of fluid (cyst) or abnormal growth (tumor) in the carpal tunnel.  Fluid buildup during pregnancy. Sometimes the cause is not known. What increases the risk? The following factors may make you more likely to develop this condition:  Having a job in which you move your wrist in the same way many times. This includes jobs like being a Software engineer or a Scientist, water quality.  Being a woman.  Having other health conditions, such as: ? Diabetes. ? Obesity. ? A thyroid gland that is not active enough (hypothyroidism). ? Kidney failure. What are the signs or symptoms? Symptoms of this condition include:  A tingling feeling in your fingers.  Tingling or a loss of feeling (numbness) in your hand.  Pain in your entire arm. This pain may get worse when you bend your wrist and elbow for a long time.  Pain in your wrist that goes up your arm to your shoulder.  Pain that goes down into your palm or fingers.  A weak feeling in your hands. You may find it hard to grab and hold items. You may feel worse at night. How is this diagnosed? This condition is diagnosed with a medical history and physical exam. You may also have tests, such as:  Electromyogram (EMG). This test checks the signals that the nerves send to the  muscles.  Nerve conduction study. This test checks how well signals pass through your nerves.  Imaging tests, such as X-rays, ultrasound, and MRI. These tests check for what might be the cause of your condition. How is this treated? This condition may be treated with:  Lifestyle changes. You will be asked to stop or change the activity that caused your problem.  Doing exercise and activities that make bones and muscles stronger (physical therapy).  Learning how to use your hand again (occupational therapy).  Medicines for pain and swelling (inflammation). You may have injections in your wrist.  A wrist splint.  Surgery. Follow these instructions at home: If you have a splint:  Wear the splint as told by your doctor. Remove it only as told by your doctor.  Loosen the splint if your fingers: ? Tingle. ? Lose feeling (become numb). ? Turn cold and blue.  Keep the splint clean.  If the splint is not waterproof: ?  Do not let it get wet. ? Cover it with a watertight covering when you take a bath or a shower. Managing pain, stiffness, and swelling   If told, put ice on the painful area: ? If you have a removable splint, remove it as told by your doctor. ? Put ice in a plastic bag. ? Place a towel between your skin and the bag. ? Leave the ice on for 20 minutes, 2-3 times per day. General instructions  Take over-the-counter and prescription medicines only as told by your doctor.  Rest your wrist from any activity that may cause pain. If needed, talk with your boss at work about changes that can help your wrist heal.  Do any exercises as told by your doctor, physical therapist, or occupational therapist.  Keep all follow-up visits as told by your doctor. This is important. Contact a doctor if:  You have new symptoms.  Medicine does not help your pain.  Your symptoms get worse. Get help right away if:  You have very bad numbness or tingling in your wrist or  hand. Summary  Carpal tunnel syndrome is a condition that causes pain in your hand and arm.  It is often caused by repeated wrist motions.  Lifestyle changes and medicines are used to treat this problem. Surgery may help in very bad cases.  Follow your doctor's instructions about wearing a splint, resting your wrist, keeping follow-up visits, and calling for help. This information is not intended to replace advice given to you by your health care provider. Make sure you discuss any questions you have with your health care provider. Document Revised: 12/02/2017 Document Reviewed: 12/02/2017 Elsevier Patient Education  [place].

## 2019-11-26 NOTE — Progress Notes (Signed)
See procedure note.

## 2019-11-27 NOTE — Procedures (Signed)
Full Name: Dominique Dillon Gender: Female MRN #: [id number] Date of Birth: 1956/05/24    Visit Date: 11/26/2019 13:16 Age: 64 Years Examining Physician: Sarina Ill, MD  Referring Physician: Deland Pretty, MD Primary Care Provider:  Holland Commons, FNP  Height: 5 feet 3 inch Patient History: 36.7 C Hand,35.0 C Foot  History: 64 year old patient with a history of uncontrolled diabetes, obesity, diabetic autonomic neuropathy, fibromyalgia here for "pain all over".  Her legs throb all the time especially when she lays down but she denies any sensory changes in the feet (not burning, no numbness or tingling, no low back pain or radicular symptoms) mostly pain behind the knees and in the calves not in the feet.  She also has throbbing in the bilateral shoulders, no significant weakness, no significant neck pain or radicular symptoms, and she reports at night sometimes she wakes up with numbness in the middle of the night with the left hand similar to symptoms in the right hand when she had CTS ongoing several years and worsening.  Summary:   Nerve Conduction Studies were performed on the bilateral upper and right lower extremities.  The left  median APB motor nerve showed prolonged distal onset latency (5.8 ms, N<4.4) and reduced amplitude(2.0 mV, N>4).  The right sural sensory nerve showed reduced amplitude (4 V, normal greater than 6).  The right superficial peroneal sensory nerve showed decreased amplitude (1 V, normal greater than 6).The right median/ulnar (palm) comparison nerve showed prolonged distal peak latency (Median Palm, 3.0 ms, N<2.2) and abnormal peak latency difference (Median Palm-Ulnar Palm, 0.8 ms, N<0.4) with a relative median delay.  The left median/ulnar (palm) comparison nerve showed prolonged distal peak latency (Median Palm, 3.9 ms, N<2.2) and abnormal peak latency difference (Median Palm-Ulnar Palm, 1.6 ms, N<0.4) with a relative median delay.The right Median  2nd Digit orthodromic sensory nerve showed prolonged distal peak latency (3.9 ms, N<3.4) and reduced amplitude(4 uV, N>10) The left  Median 2nd Digit orthodromic sensory nerve showed prolonged distal peak latency (4.9 ms, N<3.4) and reduced amplitude(4 uV, N>10). All remaining nerves (as indicated in the following tables) were within normal limits.  The left opponens pollicis showed increased motor unit amplitude, prolonged motor unit duration, polyphasic motor units, and diminished motor unit recruitment.  The right opponens pollicis showed reduced motor unit recruitment.All remaining muscles (as indicated in the following tables) were within normal limits.     Conclusion:  1. This is an abnormal study. There is electrophysiologic evidence of bilateral moderately-severe  left > right Carpal Tunnel Syndrome.  No suggestion of  radiculopathy. Patient had Carpal Tunnel Release of the right wrist in the 1990s. 2.  There is a length dependent, distal, axonal mild sensory neuropathy in the legs not unusual given clinical history of uncontrolled diabetes however this appears to be asymptomatic (patient denies any sensory changes or pain in the feet).  3 .  Nothing seen on test to explain remainder of patient's symptoms including throbbing in the calves and shoulders.  Return to primary care for this.  Will refer to Dr. Amedeo Plenty at [facility] for the carpal tunnel.     Sarina Ill, M.D.  [facility] [address], [place], [place] [address] Tel: [phone] Fax: [phone]         [facility]    Nerve / Sites Muscle Latency Ref. Amplitude Ref. Rel Amp Segments Distance Velocity Ref. Area    ms ms mV mV %  cm  m/s m/s mVms  R Median - APB     Wrist APB 4.4 ?4.4 4.8 ?4.0 100 Wrist - APB 7   20.9     Upper arm APB 8.9  4.8  99 Upper arm - Wrist 22 49 ?49 17.8  L Median - APB     Wrist APB 5.8 ?4.4 2.0 ?4.0 100 Wrist - APB 7   8.6     Upper arm APB 10.2  2.3  115 Upper arm -  Wrist 22 49 ?49 9.9  R Ulnar - ADM     Wrist ADM 2.7 ?3.3 8.3 ?6.0 100 Wrist - ADM 7   24.4     B.Elbow ADM 6.5  6.6  78.8 B.Elbow - Wrist 20 54 ?49 20.3     A.Elbow ADM 8.4  6.3  96.4 A.Elbow - B.Elbow 10 51 ?49 19.8  R Peroneal - EDB     Ankle EDB 5.3 ?6.5 3.1 ?2.0 100 Ankle - EDB 9   12.6     Fib head EDB 11.6  2.1  68.9 Fib head - Ankle 28 45 ?44 12.2     Pop fossa EDB 13.8  2.0  96 Pop fossa - Fib head 10 44 ?44 11.7         Pop fossa - Ankle      R Tibial - AH     Ankle AH 4.2 ?5.8 4.6 ?4.0 100 Ankle - AH 9   7.2     Pop fossa AH 12.8  2.5  54.5 Pop fossa - Ankle 37 43 ?41 6.2                SNC    Nerve / Sites Rec. Site Peak Lat Ref.  Amp Ref. Segments Distance Peak Diff Ref.    ms ms V V  cm ms ms  R Sural - Ankle (Calf)     Calf Ankle 4.2 ?4.4 4 ?6 Calf - Ankle 14    R Superficial peroneal - Ankle     Lat leg Ankle 4.4 ?4.4 1 ?6 Lat leg - Ankle 14    R Median, Ulnar - Transcarpal comparison     Median Palm Wrist 3.0 ?2.2 13 ?35 Median Palm - Wrist 8       Ulnar Palm Wrist 2.3 ?2.2 7 ?12 Ulnar Palm - Wrist 8          Median Palm - Ulnar Palm  0.8 ?0.4  L Median, Ulnar - Transcarpal comparison     Median Palm Wrist 3.9 ?2.2 13 ?35 Median Palm - Wrist 8       Ulnar Palm Wrist 2.3 ?2.2 8 ?12 Ulnar Palm - Wrist 8          Median Palm - Ulnar Palm  1.6 ?0.4  R Median - Orthodromic (Dig II, Mid palm)     Dig II Wrist 3.9 ?3.4 4 ?10 Dig II - Wrist 13    L Median - Orthodromic (Dig II, Mid palm)     Dig II Wrist 4.9 ?3.4 4 ?10 Dig II - Wrist 13    R Ulnar - Orthodromic, (Dig V, Mid palm)     Dig V Wrist 2.9 ?3.1 5 ?5 Dig V - Wrist 40                     F  Wave    Nerve F Lat Ref.   ms ms  R Tibial - AH 49.4 ?56.0  R Ulnar - ADM 29.1 ?32.0         EMG Summary Table    Spontaneous MUAP Recruitment  Muscle IA Fib PSW Fasc Other Amp Dur. Poly Pattern  L. Deltoid Normal None None None _______ Normal Normal Normal Normal  R. Deltoid Normal None None None _______ Normal  Normal Normal Normal  L. Triceps brachii Normal None None None _______ Normal Normal Normal Normal  R. Triceps brachii Normal None None None _______ Normal Normal Normal Normal  L. Pronator teres Normal None None None _______ Normal Normal Normal Normal  R. Pronator teres Normal None None None _______ Normal Normal Normal Normal  L. First dorsal interosseous Normal None None None _______ Normal Normal Normal Normal  R. First dorsal interosseous Normal None None None _______ Normal Normal Normal Normal  L. Opponens pollicis Normal None None None _______ Increased Increased 2+ Reduced  R. Opponens pollicis Normal None None None _______ Normal Normal Normal Reduced  R. Vastus medialis Normal None None None _______ Normal Normal Normal Normal  R. Tibialis anterior Normal None None None _______ Normal Normal Normal Normal  R. Gastrocnemius (Medial head) Normal None None None _______ Normal Normal Normal Normal  R. Extensor hallucis longus Normal None None None _______ Normal Normal Normal Normal  R. Abductor hallucis Normal None None None _______ Normal Normal Normal Normal

## 2019-11-27 NOTE — Progress Notes (Signed)
Full Name: Dominique Dillon Gender: Female MRN #: [id number] Date of Birth: February 23, 1956    Visit Date: 11/26/2019 13:16 Age: 64 Years Examining Physician: Sarina Ill, MD  Referring Physician: Deland Pretty, MD Primary Care Provider:  Holland Commons, FNP  Height: 5 feet 3 inch Patient History: 36.7 C Hand,35.0 C Foot  History: 64 year old patient with a history of uncontrolled diabetes, obesity, diabetic autonomic neuropathy, fibromyalgia here for "pain all over".  Her legs throb all the time especially when she lays down but she denies any sensory changes in the feet (not burning, no numbness or tingling, no low back pain or radicular symptoms) mostly pain behind the knees and in the calves not in the feet.  She also has throbbing in the bilateral shoulders, no significant weakness, no significant neck pain or radicular symptoms, and she reports at night sometimes she wakes up with numbness in the middle of the night with the left hand similar to symptoms in the right hand when she had CTS ongoing several years and worsening.  Summary:   Nerve Conduction Studies were performed on the bilateral upper and right lower extremities.  The left  median APB motor nerve showed prolonged distal onset latency (5.8 ms, N<4.4) and reduced amplitude(2.0 mV, N>4).  The right sural sensory nerve showed reduced amplitude (4 V, normal greater than 6).  The right superficial peroneal sensory nerve showed decreased amplitude (1 V, normal greater than 6).The right median/ulnar (palm) comparison nerve showed prolonged distal peak latency (Median Palm, 3.0 ms, N<2.2) and abnormal peak latency difference (Median Palm-Ulnar Palm, 0.8 ms, N<0.4) with a relative median delay.  The left median/ulnar (palm) comparison nerve showed prolonged distal peak latency (Median Palm, 3.9 ms, N<2.2) and abnormal peak latency difference (Median Palm-Ulnar Palm, 1.6 ms, N<0.4) with a relative median delay.The right Median  2nd Digit orthodromic sensory nerve showed prolonged distal peak latency (3.9 ms, N<3.4) and reduced amplitude(4 uV, N>10) The left  Median 2nd Digit orthodromic sensory nerve showed prolonged distal peak latency (4.9 ms, N<3.4) and reduced amplitude(4 uV, N>10). All remaining nerves (as indicated in the following tables) were within normal limits.  The left opponens pollicis showed increased motor unit amplitude, prolonged motor unit duration, polyphasic motor units, and diminished motor unit recruitment.  The right opponens pollicis showed reduced motor unit recruitment.All remaining muscles (as indicated in the following tables) were within normal limits.     Conclusion:  1. This is an abnormal study. There is electrophysiologic evidence of bilateral moderately-severe  left > right Carpal Tunnel Syndrome.  No suggestion of  radiculopathy. Patient had Carpal Tunnel Release of the right wrist in the 1990s. 2.  There is a length dependent, distal, axonal mild sensory neuropathy in the legs not unusual given clinical history of uncontrolled diabetes however this appears to be asymptomatic (patient denies any sensory changes or pain in the feet).  3 .  Nothing seen on test to explain remainder of patient's symptoms including throbbing in the calves and shoulders.  Return to primary care for this.  Will refer to Dr. Amedeo Plenty at [facility] for the carpal tunnel.     Sarina Ill, M.D.  [facility] [address] [place], [place] [address] Tel: [phone] Fax: [phone]         [facility]    Nerve / Sites Muscle Latency Ref. Amplitude Ref. Rel Amp Segments Distance Velocity Ref. Area    ms ms mV mV %  cm  m/s m/s mVms  R Median - APB     Wrist APB 4.4 ?4.4 4.8 ?4.0 100 Wrist - APB 7   20.9     Upper arm APB 8.9  4.8  99 Upper arm - Wrist 22 49 ?49 17.8  L Median - APB     Wrist APB 5.8 ?4.4 2.0 ?4.0 100 Wrist - APB 7   8.6     Upper arm APB 10.2  2.3  115 Upper arm -  Wrist 22 49 ?49 9.9  R Ulnar - ADM     Wrist ADM 2.7 ?3.3 8.3 ?6.0 100 Wrist - ADM 7   24.4     B.Elbow ADM 6.5  6.6  78.8 B.Elbow - Wrist 20 54 ?49 20.3     A.Elbow ADM 8.4  6.3  96.4 A.Elbow - B.Elbow 10 51 ?49 19.8  R Peroneal - EDB     Ankle EDB 5.3 ?6.5 3.1 ?2.0 100 Ankle - EDB 9   12.6     Fib head EDB 11.6  2.1  68.9 Fib head - Ankle 28 45 ?44 12.2     Pop fossa EDB 13.8  2.0  96 Pop fossa - Fib head 10 44 ?44 11.7         Pop fossa - Ankle      R Tibial - AH     Ankle AH 4.2 ?5.8 4.6 ?4.0 100 Ankle - AH 9   7.2     Pop fossa AH 12.8  2.5  54.5 Pop fossa - Ankle 37 43 ?41 6.2                SNC    Nerve / Sites Rec. Site Peak Lat Ref.  Amp Ref. Segments Distance Peak Diff Ref.    ms ms V V  cm ms ms  R Sural - Ankle (Calf)     Calf Ankle 4.2 ?4.4 4 ?6 Calf - Ankle 14    R Superficial peroneal - Ankle     Lat leg Ankle 4.4 ?4.4 1 ?6 Lat leg - Ankle 14    R Median, Ulnar - Transcarpal comparison     Median Palm Wrist 3.0 ?2.2 13 ?35 Median Palm - Wrist 8       Ulnar Palm Wrist 2.3 ?2.2 7 ?12 Ulnar Palm - Wrist 8          Median Palm - Ulnar Palm  0.8 ?0.4  L Median, Ulnar - Transcarpal comparison     Median Palm Wrist 3.9 ?2.2 13 ?35 Median Palm - Wrist 8       Ulnar Palm Wrist 2.3 ?2.2 8 ?12 Ulnar Palm - Wrist 8          Median Palm - Ulnar Palm  1.6 ?0.4  R Median - Orthodromic (Dig II, Mid palm)     Dig II Wrist 3.9 ?3.4 4 ?10 Dig II - Wrist 13    L Median - Orthodromic (Dig II, Mid palm)     Dig II Wrist 4.9 ?3.4 4 ?10 Dig II - Wrist 13    R Ulnar - Orthodromic, (Dig V, Mid palm)     Dig V Wrist 2.9 ?3.1 5 ?5 Dig V - Wrist 40                     F  Wave    Nerve F Lat Ref.   ms ms  R Tibial - AH 49.4 ?56.0  R Ulnar - ADM 29.1 ?32.0         EMG Summary Table    Spontaneous MUAP Recruitment  Muscle IA Fib PSW Fasc Other Amp Dur. Poly Pattern  L. Deltoid Normal None None None _______ Normal Normal Normal Normal  R. Deltoid Normal None None None _______ Normal  Normal Normal Normal  L. Triceps brachii Normal None None None _______ Normal Normal Normal Normal  R. Triceps brachii Normal None None None _______ Normal Normal Normal Normal  L. Pronator teres Normal None None None _______ Normal Normal Normal Normal  R. Pronator teres Normal None None None _______ Normal Normal Normal Normal  L. First dorsal interosseous Normal None None None _______ Normal Normal Normal Normal  R. First dorsal interosseous Normal None None None _______ Normal Normal Normal Normal  L. Opponens pollicis Normal None None None _______ Increased Increased 2+ Reduced  R. Opponens pollicis Normal None None None _______ Normal Normal Normal Reduced  R. Vastus medialis Normal None None None _______ Normal Normal Normal Normal  R. Tibialis anterior Normal None None None _______ Normal Normal Normal Normal  R. Gastrocnemius (Medial head) Normal None None None _______ Normal Normal Normal Normal  R. Extensor hallucis longus Normal None None None _______ Normal Normal Normal Normal  R. Abductor hallucis Normal None None None _______ Normal Normal Normal Normal

## 2019-11-28 LAB — COMPREHENSIVE METABOLIC PANEL
ALT: 13 IU/L (ref 0–32)
AST: 22 IU/L (ref 0–40)
Albumin/Globulin Ratio: 1.7 (ref 1.2–2.2)
Albumin: 4.5 g/dL (ref 3.8–4.8)
Alkaline Phosphatase: 134 IU/L — ABNORMAL HIGH (ref 39–117)
BUN/Creatinine Ratio: 16 (ref 12–28)
BUN: 14 mg/dL (ref 8–27)
Bilirubin Total: 0.5 mg/dL (ref 0.0–1.2)
CO2: 25 mmol/L (ref 20–29)
Calcium: 10.2 mg/dL (ref 8.7–10.3)
Chloride: 99 mmol/L (ref 96–106)
Creatinine, Ser: 0.85 mg/dL (ref 0.57–1.00)
GFR calc Af Amer: 83 mL/min/{1.73_m2} (ref >59)
GFR calc non Af Amer: 72 mL/min/{1.73_m2} (ref >59)
Globulin, Total: 2.7 g/dL (ref 1.5–4.5)
Glucose: 150 mg/dL — ABNORMAL HIGH (ref 65–99)
Potassium: 4.8 mmol/L (ref 3.5–5.2)
Sodium: 137 mmol/L (ref 134–144)
Total Protein: 7.2 g/dL (ref 6.0–8.5)

## 2019-11-28 LAB — CK: Total CK: 109 U/L (ref 32–182)

## 2019-11-28 LAB — MULTIPLE MYELOMA PANEL, SERUM
Albumin SerPl Elph-Mcnc: 3.8 g/dL (ref 2.9–4.4)
Albumin/Glob SerPl: 1.2 (ref 0.7–1.7)
Alpha 1: 0.2 g/dL (ref 0.0–0.4)
Alpha2 Glob SerPl Elph-Mcnc: 0.8 g/dL (ref 0.4–1.0)
B-Globulin SerPl Elph-Mcnc: 1.2 g/dL (ref 0.7–1.3)
Gamma Glob SerPl Elph-Mcnc: 1.2 g/dL (ref 0.4–1.8)
Globulin, Total: 3.4 g/dL (ref 2.2–3.9)
IgA/Immunoglobulin A, Serum: 112 mg/dL (ref 87–352)
IgG (Immunoglobin G), Serum: 1238 mg/dL (ref 586–1602)
IgM (Immunoglobulin M), Srm: 137 mg/dL (ref 26–217)

## 2019-11-28 LAB — CBC
Hematocrit: 38.1 % (ref 34.0–46.6)
Hemoglobin: 12.6 g/dL (ref 11.1–15.9)
MCH: 25.2 pg — ABNORMAL LOW (ref 26.6–33.0)
MCHC: 33.1 g/dL (ref 31.5–35.7)
MCV: 76 fL — ABNORMAL LOW (ref 79–97)
Platelets: 318 10*3/uL (ref 150–450)
RBC: 5 x10E6/uL (ref 3.77–5.28)
RDW: 14.3 % (ref 11.7–15.4)
WBC: 5.5 10*3/uL (ref 3.4–10.8)

## 2019-11-28 LAB — VITAMIN B1: Thiamine: 117.3 nmol/L (ref 66.5–200.0)

## 2019-11-28 LAB — SEDIMENTATION RATE: Sed Rate: 13 mm/hr (ref 0–40)

## 2019-11-28 LAB — HEAVY METALS, BLOOD
Arsenic: 7 ug/L (ref 2–23)
Lead, Blood: <1 ug/dL (ref 0–4)
Mercury: 1.2 ug/L (ref 0.0–14.9)

## 2019-11-28 LAB — METHYLMALONIC ACID, SERUM: Methylmalonic Acid: 94 nmol/L (ref 0–378)

## 2019-11-28 LAB — TSH: TSH: 1.02 u[IU]/mL (ref 0.450–4.500)

## 2019-11-28 LAB — B12 AND FOLATE PANEL
Folate: >20 ng/mL (ref >3.0)
Vitamin B-12: 828 pg/mL (ref 232–1245)

## 2019-11-28 LAB — ANTINUCLEAR ANTIBODIES, IFA: ANA Titer 1: NEGATIVE

## 2019-11-28 LAB — VITAMIN B6: Vitamin B6: 15.2 ug/L (ref 2.0–32.8)

## 2019-11-28 LAB — RHEUMATOID FACTOR: Rheumatoid fact SerPl-aCnc: <10 IU/mL (ref 0.0–13.9)

## 2019-12-05 DIAGNOSIS — E7800 Pure hypercholesterolemia, unspecified: Secondary | ICD-10-CM | POA: Diagnosis not present

## 2019-12-05 DIAGNOSIS — E1165 Type 2 diabetes mellitus with hyperglycemia: Secondary | ICD-10-CM | POA: Diagnosis not present

## 2019-12-05 DIAGNOSIS — I10 Essential (primary) hypertension: Secondary | ICD-10-CM | POA: Diagnosis not present

## 2019-12-06 NOTE — Telephone Encounter (Signed)
I called the pt on home # and LVM asking for call back.

## 2019-12-06 NOTE — Telephone Encounter (Signed)
I returned pt's call at the number she provided below in voicemail. Received no answer and her vm was full again.

## 2019-12-06 NOTE — Telephone Encounter (Signed)
Phone rep checked office voicemail's,@ 2:17 pt left a voicemail returning the call to [facility] please call pt back at [phone]

## 2019-12-10 NOTE — Telephone Encounter (Signed)
Spoke with pt and discussed results of doppler study. Pt verbalized understanding and appreciation. She has an upcoming appt with PCP next week (?). I let her know I would fax the results to them again. Pt also stated she wants to hold for a bit on the referral to the hand surgeon that Dr. Jaynee Eagles sent.

## 2019-12-27 DIAGNOSIS — E1165 Type 2 diabetes mellitus with hyperglycemia: Secondary | ICD-10-CM | POA: Diagnosis not present

## 2019-12-27 DIAGNOSIS — I10 Essential (primary) hypertension: Secondary | ICD-10-CM | POA: Diagnosis not present

## 2019-12-27 DIAGNOSIS — E7800 Pure hypercholesterolemia, unspecified: Secondary | ICD-10-CM | POA: Diagnosis not present

## 2020-01-15 ENCOUNTER — Telehealth: Payer: Self-pay

## 2020-01-15 NOTE — Telephone Encounter (Signed)
Patient called stating that Dr. Jaynee Eagles had referred her for an MRI and to [facility] but she has not heard anything back about either. I do not see orders in the chart but I do see mention of a referral to Dr. Amedeo Plenty for the CTS in her EMG report. She states that Dr. Jaynee Eagles told her she was going to have an MRI for a knot on the back of her leg. She says that she called yesterday and was advised to call [facility] to see if they do MRI's there but they do not. I do not see any documentation of this in the chart. She says to make sure to contact her on her cell phone b/c her home phone is not working. [phone]

## 2020-01-15 NOTE — Telephone Encounter (Signed)
Spoke with pt and discussed that I checked through the office note and EMG results and did not see any mention of MRI being ordered. We discussed the results again of the doppler that stated pt should follow with pcp as there was a cystic structure noted at the back of the knees and that the results were already sent to them. Pt stated she did not want to proceed with referral to Dr. Amedeo Plenty at this time because her carpal tunnel was not bothering her but she understands that Dr. Jaynee Eagles found moderately-severe bilateral carpal tunnel L>R on the EMG/NCS. Pt also is aware that in the EMG findings Dr. Jaynee Eagles stated the following:  "Nothing seen on test to explain remainder of patient's symptoms including throbbing in the calves and shoulders.  Return to primary care for this.  Will refer to Dr. Amedeo Plenty at [facility] for the carpal tunnel." The patient verbalized understanding for the above clarification. She will return to PCP. She will call us back if needed and verbalized appreciation for the call.

## 2020-01-16 NOTE — Telephone Encounter (Signed)
Called and spoke to ortho . Ortho called and did leave patient message Ortho will get patient scheduled .  I called and relayed to patient that she needed to call there office and schedule her apt so she could schedule a good day for . Telephone Number provided.

## 2020-01-17 ENCOUNTER — Other Ambulatory Visit: Payer: Self-pay | Admitting: Obstetrics and Gynecology

## 2020-01-17 ENCOUNTER — Other Ambulatory Visit: Payer: Self-pay | Admitting: Registered Nurse

## 2020-01-17 DIAGNOSIS — Z9889 Other specified postprocedural states: Secondary | ICD-10-CM

## 2020-01-17 DIAGNOSIS — Z1231 Encounter for screening mammogram for malignant neoplasm of breast: Secondary | ICD-10-CM

## 2020-02-01 ENCOUNTER — Other Ambulatory Visit: Payer: Self-pay

## 2020-02-01 ENCOUNTER — Ambulatory Visit
Admission: RE | Admit: 2020-02-01 | Discharge: 2020-02-01 | Disposition: A | Discharge status: Home / Self Care | Payer: Medicare PPO | Source: Ambulatory Visit | Attending: Obstetrics and Gynecology | Admitting: Obstetrics and Gynecology

## 2020-02-01 DIAGNOSIS — R928 Other abnormal and inconclusive findings on diagnostic imaging of breast: Secondary | ICD-10-CM | POA: Diagnosis not present

## 2020-02-01 DIAGNOSIS — Z9889 Other specified postprocedural states: Secondary | ICD-10-CM

## 2020-02-28 DIAGNOSIS — E7800 Pure hypercholesterolemia, unspecified: Secondary | ICD-10-CM | POA: Diagnosis not present

## 2020-02-28 DIAGNOSIS — I10 Essential (primary) hypertension: Secondary | ICD-10-CM | POA: Diagnosis not present

## 2020-02-28 DIAGNOSIS — E1165 Type 2 diabetes mellitus with hyperglycemia: Secondary | ICD-10-CM | POA: Diagnosis not present

## 2020-02-29 DIAGNOSIS — Z6832 Body mass index (BMI) 32.0-32.9, adult: Secondary | ICD-10-CM | POA: Diagnosis not present

## 2020-02-29 DIAGNOSIS — Z01419 Encounter for gynecological examination (general) (routine) without abnormal findings: Secondary | ICD-10-CM | POA: Diagnosis not present

## 2020-03-19 DIAGNOSIS — H2513 Age-related nuclear cataract, bilateral: Secondary | ICD-10-CM | POA: Diagnosis not present

## 2020-03-19 DIAGNOSIS — E119 Type 2 diabetes mellitus without complications: Secondary | ICD-10-CM | POA: Diagnosis not present

## 2020-03-19 DIAGNOSIS — H35362 Drusen (degenerative) of macula, left eye: Secondary | ICD-10-CM | POA: Diagnosis not present

## 2020-03-19 DIAGNOSIS — H04123 Dry eye syndrome of bilateral lacrimal glands: Secondary | ICD-10-CM | POA: Diagnosis not present

## 2020-03-19 DIAGNOSIS — H40023 Open angle with borderline findings, high risk, bilateral: Secondary | ICD-10-CM | POA: Diagnosis not present

## 2020-04-03 DIAGNOSIS — M25561 Pain in right knee: Secondary | ICD-10-CM | POA: Diagnosis not present

## 2020-04-03 DIAGNOSIS — M25562 Pain in left knee: Secondary | ICD-10-CM | POA: Diagnosis not present

## 2020-04-16 DIAGNOSIS — M25561 Pain in right knee: Secondary | ICD-10-CM | POA: Diagnosis not present

## 2020-05-07 DIAGNOSIS — M5431 Sciatica, right side: Secondary | ICD-10-CM | POA: Diagnosis not present

## 2020-05-12 DIAGNOSIS — M5431 Sciatica, right side: Secondary | ICD-10-CM | POA: Diagnosis not present

## 2020-05-12 DIAGNOSIS — M545 Low back pain, unspecified: Secondary | ICD-10-CM | POA: Diagnosis not present

## 2020-05-19 DIAGNOSIS — M5431 Sciatica, right side: Secondary | ICD-10-CM | POA: Diagnosis not present

## 2020-05-21 DIAGNOSIS — M5431 Sciatica, right side: Secondary | ICD-10-CM | POA: Diagnosis not present

## 2020-05-21 DIAGNOSIS — Z20822 Contact with and (suspected) exposure to covid-19: Secondary | ICD-10-CM | POA: Diagnosis not present

## 2020-05-26 DIAGNOSIS — M5431 Sciatica, right side: Secondary | ICD-10-CM | POA: Diagnosis not present

## 2020-05-29 DIAGNOSIS — M5431 Sciatica, right side: Secondary | ICD-10-CM | POA: Diagnosis not present

## 2020-06-02 DIAGNOSIS — M5431 Sciatica, right side: Secondary | ICD-10-CM | POA: Diagnosis not present

## 2020-06-02 DIAGNOSIS — M545 Low back pain, unspecified: Secondary | ICD-10-CM | POA: Diagnosis not present

## 2020-06-03 DIAGNOSIS — M25561 Pain in right knee: Secondary | ICD-10-CM | POA: Diagnosis not present

## 2020-06-03 DIAGNOSIS — M5431 Sciatica, right side: Secondary | ICD-10-CM | POA: Diagnosis not present

## 2020-06-03 DIAGNOSIS — M5459 Other low back pain: Secondary | ICD-10-CM | POA: Diagnosis not present

## 2020-06-09 DIAGNOSIS — M5431 Sciatica, right side: Secondary | ICD-10-CM | POA: Diagnosis not present

## 2020-06-12 DIAGNOSIS — M545 Low back pain, unspecified: Secondary | ICD-10-CM | POA: Diagnosis not present

## 2020-06-12 DIAGNOSIS — M5431 Sciatica, right side: Secondary | ICD-10-CM | POA: Diagnosis not present

## 2020-06-24 DIAGNOSIS — E7800 Pure hypercholesterolemia, unspecified: Secondary | ICD-10-CM | POA: Diagnosis not present

## 2020-06-24 DIAGNOSIS — M5416 Radiculopathy, lumbar region: Secondary | ICD-10-CM | POA: Diagnosis not present

## 2020-06-24 DIAGNOSIS — E1165 Type 2 diabetes mellitus with hyperglycemia: Secondary | ICD-10-CM | POA: Diagnosis not present

## 2020-06-24 DIAGNOSIS — I10 Essential (primary) hypertension: Secondary | ICD-10-CM | POA: Diagnosis not present

## 2020-06-26 DIAGNOSIS — R749 Abnormal serum enzyme level, unspecified: Secondary | ICD-10-CM | POA: Diagnosis not present

## 2020-07-01 DIAGNOSIS — Z794 Long term (current) use of insulin: Secondary | ICD-10-CM | POA: Diagnosis not present

## 2020-07-01 DIAGNOSIS — E1165 Type 2 diabetes mellitus with hyperglycemia: Secondary | ICD-10-CM | POA: Diagnosis not present

## 2020-07-01 DIAGNOSIS — E1140 Type 2 diabetes mellitus with diabetic neuropathy, unspecified: Secondary | ICD-10-CM | POA: Diagnosis not present

## 2020-07-01 DIAGNOSIS — E7800 Pure hypercholesterolemia, unspecified: Secondary | ICD-10-CM | POA: Diagnosis not present

## 2020-07-01 DIAGNOSIS — R748 Abnormal levels of other serum enzymes: Secondary | ICD-10-CM | POA: Diagnosis not present

## 2020-07-01 DIAGNOSIS — I10 Essential (primary) hypertension: Secondary | ICD-10-CM | POA: Diagnosis not present

## 2020-07-02 DIAGNOSIS — M48061 Spinal stenosis, lumbar region without neurogenic claudication: Secondary | ICD-10-CM | POA: Diagnosis not present

## 2020-07-14 DIAGNOSIS — R300 Dysuria: Secondary | ICD-10-CM | POA: Diagnosis not present

## 2020-08-13 ENCOUNTER — Other Ambulatory Visit: Payer: Medicare PPO

## 2020-08-29 DIAGNOSIS — M25562 Pain in left knee: Secondary | ICD-10-CM | POA: Diagnosis not present

## 2020-08-29 DIAGNOSIS — M1711 Unilateral primary osteoarthritis, right knee: Secondary | ICD-10-CM | POA: Diagnosis not present

## 2020-09-23 DIAGNOSIS — H40023 Open angle with borderline findings, high risk, bilateral: Secondary | ICD-10-CM | POA: Diagnosis not present

## 2020-09-23 DIAGNOSIS — H04123 Dry eye syndrome of bilateral lacrimal glands: Secondary | ICD-10-CM | POA: Diagnosis not present

## 2020-09-25 DIAGNOSIS — N390 Urinary tract infection, site not specified: Secondary | ICD-10-CM | POA: Diagnosis not present

## 2020-09-25 DIAGNOSIS — Z0000 Encounter for general adult medical examination without abnormal findings: Secondary | ICD-10-CM | POA: Diagnosis not present

## 2020-09-25 DIAGNOSIS — E1165 Type 2 diabetes mellitus with hyperglycemia: Secondary | ICD-10-CM | POA: Diagnosis not present

## 2020-09-25 DIAGNOSIS — I10 Essential (primary) hypertension: Secondary | ICD-10-CM | POA: Diagnosis not present

## 2020-09-25 DIAGNOSIS — E7800 Pure hypercholesterolemia, unspecified: Secondary | ICD-10-CM | POA: Diagnosis not present

## 2020-09-30 DIAGNOSIS — Z794 Long term (current) use of insulin: Secondary | ICD-10-CM | POA: Diagnosis not present

## 2020-09-30 DIAGNOSIS — Z0000 Encounter for general adult medical examination without abnormal findings: Secondary | ICD-10-CM | POA: Diagnosis not present

## 2020-09-30 DIAGNOSIS — E1165 Type 2 diabetes mellitus with hyperglycemia: Secondary | ICD-10-CM | POA: Diagnosis not present

## 2020-09-30 DIAGNOSIS — I10 Essential (primary) hypertension: Secondary | ICD-10-CM | POA: Diagnosis not present

## 2020-09-30 DIAGNOSIS — F329 Major depressive disorder, single episode, unspecified: Secondary | ICD-10-CM | POA: Diagnosis not present

## 2020-09-30 DIAGNOSIS — M1711 Unilateral primary osteoarthritis, right knee: Secondary | ICD-10-CM | POA: Diagnosis not present

## 2020-09-30 DIAGNOSIS — E7800 Pure hypercholesterolemia, unspecified: Secondary | ICD-10-CM | POA: Diagnosis not present

## 2020-09-30 DIAGNOSIS — N390 Urinary tract infection, site not specified: Secondary | ICD-10-CM | POA: Diagnosis not present

## 2020-10-07 DIAGNOSIS — M1711 Unilateral primary osteoarthritis, right knee: Secondary | ICD-10-CM | POA: Diagnosis not present

## 2020-10-14 DIAGNOSIS — M1711 Unilateral primary osteoarthritis, right knee: Secondary | ICD-10-CM | POA: Diagnosis not present

## 2020-12-15 DIAGNOSIS — J029 Acute pharyngitis, unspecified: Secondary | ICD-10-CM | POA: Diagnosis not present

## 2020-12-15 DIAGNOSIS — Z20822 Contact with and (suspected) exposure to covid-19: Secondary | ICD-10-CM | POA: Diagnosis not present

## 2020-12-15 DIAGNOSIS — R52 Pain, unspecified: Secondary | ICD-10-CM | POA: Diagnosis not present

## 2020-12-15 DIAGNOSIS — R0981 Nasal congestion: Secondary | ICD-10-CM | POA: Diagnosis not present

## 2020-12-17 DIAGNOSIS — J019 Acute sinusitis, unspecified: Secondary | ICD-10-CM | POA: Diagnosis not present

## 2020-12-19 DIAGNOSIS — Z20822 Contact with and (suspected) exposure to covid-19: Secondary | ICD-10-CM | POA: Diagnosis not present

## 2020-12-26 DIAGNOSIS — E1165 Type 2 diabetes mellitus with hyperglycemia: Secondary | ICD-10-CM | POA: Diagnosis not present

## 2020-12-26 DIAGNOSIS — E7800 Pure hypercholesterolemia, unspecified: Secondary | ICD-10-CM | POA: Diagnosis not present

## 2020-12-29 ENCOUNTER — Other Ambulatory Visit: Payer: Self-pay | Admitting: Obstetrics and Gynecology

## 2020-12-29 DIAGNOSIS — Z1231 Encounter for screening mammogram for malignant neoplasm of breast: Secondary | ICD-10-CM

## 2020-12-30 DIAGNOSIS — I10 Essential (primary) hypertension: Secondary | ICD-10-CM | POA: Diagnosis not present

## 2020-12-30 DIAGNOSIS — E7800 Pure hypercholesterolemia, unspecified: Secondary | ICD-10-CM | POA: Diagnosis not present

## 2020-12-30 DIAGNOSIS — Z794 Long term (current) use of insulin: Secondary | ICD-10-CM | POA: Diagnosis not present

## 2020-12-30 DIAGNOSIS — E1165 Type 2 diabetes mellitus with hyperglycemia: Secondary | ICD-10-CM | POA: Diagnosis not present

## 2021-01-21 DIAGNOSIS — I10 Essential (primary) hypertension: Secondary | ICD-10-CM | POA: Diagnosis not present

## 2021-01-21 DIAGNOSIS — E7800 Pure hypercholesterolemia, unspecified: Secondary | ICD-10-CM | POA: Diagnosis not present

## 2021-01-21 DIAGNOSIS — E1165 Type 2 diabetes mellitus with hyperglycemia: Secondary | ICD-10-CM | POA: Diagnosis not present

## 2021-01-21 DIAGNOSIS — Z794 Long term (current) use of insulin: Secondary | ICD-10-CM | POA: Diagnosis not present

## 2021-01-30 ENCOUNTER — Emergency Department (HOSPITAL_BASED_OUTPATIENT_CLINIC_OR_DEPARTMENT_OTHER): Payer: Medicare PPO

## 2021-01-30 ENCOUNTER — Emergency Department (HOSPITAL_BASED_OUTPATIENT_CLINIC_OR_DEPARTMENT_OTHER)
Admission: EM | Admit: 2021-01-30 | Discharge: 2021-01-30 | Disposition: A | Discharge status: Home / Self Care | Payer: Medicare PPO | Attending: Emergency Medicine | Admitting: Emergency Medicine

## 2021-01-30 ENCOUNTER — Other Ambulatory Visit: Payer: Self-pay

## 2021-01-30 ENCOUNTER — Encounter (HOSPITAL_BASED_OUTPATIENT_CLINIC_OR_DEPARTMENT_OTHER): Payer: Self-pay | Admitting: *Deleted

## 2021-01-30 DIAGNOSIS — E119 Type 2 diabetes mellitus without complications: Secondary | ICD-10-CM | POA: Diagnosis not present

## 2021-01-30 DIAGNOSIS — Z20822 Contact with and (suspected) exposure to covid-19: Secondary | ICD-10-CM | POA: Insufficient documentation

## 2021-01-30 DIAGNOSIS — I10 Essential (primary) hypertension: Secondary | ICD-10-CM | POA: Insufficient documentation

## 2021-01-30 DIAGNOSIS — J40 Bronchitis, not specified as acute or chronic: Secondary | ICD-10-CM | POA: Diagnosis not present

## 2021-01-30 DIAGNOSIS — Z794 Long term (current) use of insulin: Secondary | ICD-10-CM | POA: Insufficient documentation

## 2021-01-30 DIAGNOSIS — Z7984 Long term (current) use of oral hypoglycemic drugs: Secondary | ICD-10-CM | POA: Insufficient documentation

## 2021-01-30 DIAGNOSIS — R0602 Shortness of breath: Secondary | ICD-10-CM | POA: Diagnosis not present

## 2021-01-30 DIAGNOSIS — Z853 Personal history of malignant neoplasm of breast: Secondary | ICD-10-CM | POA: Insufficient documentation

## 2021-01-30 DIAGNOSIS — Z7982 Long term (current) use of aspirin: Secondary | ICD-10-CM | POA: Diagnosis not present

## 2021-01-30 DIAGNOSIS — R059 Cough, unspecified: Secondary | ICD-10-CM | POA: Diagnosis not present

## 2021-01-30 DIAGNOSIS — R062 Wheezing: Secondary | ICD-10-CM | POA: Diagnosis not present

## 2021-01-30 DIAGNOSIS — Z79899 Other long term (current) drug therapy: Secondary | ICD-10-CM | POA: Diagnosis not present

## 2021-01-30 LAB — RESP PANEL BY RT-PCR (FLU A&B, COVID) ARPGX2
Influenza A by PCR: NEGATIVE
Influenza B by PCR: NEGATIVE
SARS Coronavirus 2 by RT PCR: NEGATIVE

## 2021-01-30 MED ORDER — BENZONATATE 100 MG PO CAPS
100.0000 mg | ORAL_CAPSULE | Freq: Three times a day (TID) | ORAL | 0 refills | Status: DC
Start: 2021-01-30 — End: 2021-02-15

## 2021-01-30 MED ORDER — ALBUTEROL SULFATE HFA 108 (90 BASE) MCG/ACT IN AERS
2.0000 | INHALATION_SPRAY | Freq: Once | RESPIRATORY_TRACT | Status: AC
  Administered 2021-01-30: 2 via RESPIRATORY_TRACT
  Filled 2021-01-30: qty 6.7

## 2021-01-30 NOTE — Discharge Instructions (Addendum)
You can use your inhaler every 6 hours (2 puffs).

## 2021-01-30 NOTE — ED Provider Notes (Signed)
[facility] EMERGENCY DEPT Provider Note   CSN: [id number] Arrival date & time: 01/30/21  0813     History Chief Complaint  Patient presents with   Cough    Dominique Dillon is a 65 y.o. female.  Vaccinated x2 for COVID. No booster.  The history is provided by the patient.  Cough Cough characteristics:  Productive Sputum characteristics:  [place] Severity:  Moderate Onset quality:  Gradual Duration:  3 days Timing:  Intermittent Progression:  Unchanged Chronicity:  New Smoker: no   Context: sick contacts   Context comment:  2 grandchildren have had illnesses- pneumonia and ear infection Relieved by:  Nothing Worsened by:  Nothing Ineffective treatments: OTC remedies. Associated symptoms: ear fullness, shortness of breath, sinus congestion and sore throat   Associated symptoms: no chest pain, no chills, no ear pain, no fever and no rash       Past Medical History:  Diagnosis Date   Allergic rhinitis    Arthritis    knees   Breast cancer of upper-outer quadrant of left female breast ([place]) 09/12/2014   DDD (degenerative disc disease), cervical    Depression    GERD (gastroesophageal reflux disease)    History of anemia    still takes iron supplement   Hypertension    under control with med., has been on med. since 1990s   Insulin dependent diabetes mellitus    Obesity    Personal history of chemotherapy    Personal history of radiation therapy    Seasonal asthma    no current med.    Patient Active Problem List   Diagnosis Date Noted   Dyslipidemia, goal LDL below 100 40/98/1191   Metabolic syndrome 47/82/9562   Chest pain with moderate risk for cardiac etiology 05/28/2015   Right carotid bruit 05/28/2015   Intermittent claudication ([place]) 05/28/2015   Hypoxia 05/20/2015   Hypotension 05/20/2015   Chest pain 05/20/2015   Hypokalemia 05/20/2015   Prolonged QT interval 05/20/2015   Syncope 05/20/2015   Chest pain at rest 05/20/2015   Obesity,  Class II, BMI 35-39.9    Medication adverse effect    Thalassemia 03/05/2015   Seasonal allergies 02/08/2015   Cancer associated pain 02/08/2015   Glaucoma 12/31/2014   Breast cancer of upper-outer quadrant of left female breast ([place]) 09/12/2014   Abscess of abdominal wall s/p I&D 7/30- & 03/08/2014 03/08/2014   Cellulitis 03/08/2014   Diabetes mellitus type 2, controlled ([place]) 03/08/2014   Essential hypertension, benign 03/08/2014   GERD (gastroesophageal reflux disease) 03/08/2014   Anemia in neoplastic disease 03/08/2014    Past Surgical History:  Procedure Laterality Date   ABDOMINAL HYSTERECTOMY  1995   complete   APPENDECTOMY  1980   BREAST BIOPSY Left 10/15/2015   BREAST BIOPSY Left 09/26/2015   BREAST BIOPSY Left 09/09/2014   BREAST LUMPECTOMY Left 10/01/2014   CARPAL TUNNEL RELEASE Right 08/06/2004   CESAREAN SECTION     x 2   CHOLECYSTECTOMY  1980   COLONOSCOPY     exploratory surgery of abdomen  11/1978   IRRIGATION AND DEBRIDEMENT ABSCESS N/A 03/08/2014   Procedure: IRRIGATION AND DEBRIDEMENT ABDOMINAL WALL ABSCESS;  Surgeon: Adin Hector, MD;  Location: [facility];  Service: General;  Laterality: N/A;   KNEE ARTHROSCOPY Bilateral    NM MYOVIEW LTD  06/2015   LOW RISK. HTN response to exercise. No EKG changes. Small, partially reversible apical defect (probably Breast Attenuation, but CRO apical Ischemia).  EF 63% with no RWMA  polyp removed from L breast     PORTACATH PLACEMENT Right 10/21/2014   Procedure: INSERTION PORT-A-CATH;  Surgeon: Rolm Bookbinder, MD;  Location: [facility];  Service: General;  Laterality: Right;   RADIOACTIVE SEED GUIDED PARTIAL MASTECTOMY WITH AXILLARY SENTINEL LYMPH NODE BIOPSY Left 10/01/2014   Procedure: RADIOACTIVE SEED GUIDED LEFT BREAST LUMPECTOMY WITH LEFT  AXILLARY SENTINEL LYMPH NODE BIOPSY;  Surgeon: Rolm Bookbinder, MD;  Location: [facility];  Service: General;  Laterality: Left;   RE-EXCISION OF  BREAST LUMPECTOMY Left 10/21/2014   Procedure: RE-EXCISION OF BREAST CANCER, POSTERIOR MARGINS;  Surgeon: Rolm Bookbinder, MD;  Location: [facility];  Service: General;  Laterality: Left;   [place] Right 08/06/2004   middle finger   UPPER GI ENDOSCOPY       OB History     Gravida  4   Para      Term      Preterm      AB  1   Living  2      SAB      IAB      Ectopic  1   Multiple      Live Births              Family History  Problem Relation Age of Onset   Diabetes Mother    Prostate cancer Father    Stomach cancer Maternal Aunt    Pancreatic cancer Maternal Uncle    Lung cancer Maternal Grandmother    Breast cancer Maternal Aunt    Pancreatic cancer Maternal Aunt    Pancreatic cancer Maternal Uncle    Heart attack Maternal Grandfather     Social History   Tobacco Use   Smoking status: Never   Smokeless tobacco: Never  Vaping Use   Vaping Use: Never used  Substance Use Topics   Alcohol use: No   Drug use: No    Home Medications Prior to Admission medications   Medication Sig Start Date End Date Taking? Authorizing Provider  aspirin 81 MG tablet Take 81 mg by mouth daily.    [provider]  cetirizine (ZYRTEC) 10 MG tablet Take 10 mg by mouth daily.    [provider]  DULoxetine (CYMBALTA) 30 MG capsule Take 60 mg by mouth daily.    [provider]  gabapentin (NEURONTIN) 300 MG capsule Take 1 capsule (300 mg total) by mouth 3 (three) times daily as needed. 11/19/19   Melvenia Beam, MD  hydrochlorothiazide (HYDRODIURIL) 25 MG tablet Take 25 mg by mouth daily.    [provider]  insulin aspart (NOVOLOG FLEXPEN) 100 UNIT/ML FlexPen Inject 20 Units into the skin with breakfast, with lunch, and with evening meal. Before carb meals.    [provider]  losartan (COZAAR) 25 MG tablet Take 1 tablet (25 mg total) by mouth daily. PLEASE SCHEDULE OFFICE  VISIT FOR REFILLS OR GET FROM PCP 12/07/17   Leonie Man, MD  lovastatin (MEVACOR) 40 MG tablet Take 40 mg by mouth daily.    [provider]  metFORMIN (GLUCOPHAGE-XR) 500 MG 24 hr tablet Take 2 tablets by mouth in the morning and at bedtime. 09/10/19   [provider]  Multiple Vitamins-Minerals (CENTRUM WOMEN PO) Take 1 each by mouth daily.    [provider]  Omega-3 Fatty Acids (FISH OIL PO) Take 3 each by mouth daily.     [provider]  OZEMPIC, 1 MG/DOSE, 2 MG/1.5ML SOPN Inject 1 mg into the skin. On Wednesdays. 08/27/19   [provider]  pantoprazole (PROTONIX) 40 MG tablet Take 40 mg by mouth daily.    [provider]  TRESIBA FLEXTOUCH 100 UNIT/ML FlexTouch Pen Inject 28 Units into the skin daily. 11/07/19   [provider]    Allergies    Crestor [rosuvastatin], Humalog [insulin lispro], Lipitor [atorvastatin], Macrobid [nitrofurantoin monohyd macro], and Biaxin [clarithromycin]  Review of Systems   Review of Systems  Constitutional:  Negative for chills and fever.  HENT:  Positive for sore throat. Negative for ear pain.   Eyes:  Negative for pain and visual disturbance.  Respiratory:  Positive for cough and shortness of breath.   Cardiovascular:  Negative for chest pain and palpitations.  Gastrointestinal:  Negative for abdominal pain and vomiting.  Genitourinary:  Negative for dysuria and hematuria.  Musculoskeletal:  Negative for arthralgias and back pain.  Skin:  Negative for color change and rash.  Neurological:  Negative for seizures and syncope.  All other systems reviewed and are negative.  Physical Exam Updated Vital Signs BP (!) 132/99 (BP Location: Right Arm)   Pulse 90   Temp 98.5 F (36.9 C) (Oral)   Resp 18   Ht 5\' 3"  (1.6 m)   Wt 83 kg   SpO2 100%   BMI 32.42 kg/m   Physical Exam Vitals and nursing note reviewed.  HENT:     Head: Normocephalic and atraumatic.     Right Ear: Tympanic  membrane, ear canal and external ear normal.     Left Ear: Ear canal and external ear normal.     Ears:     Comments: Serous effusion left ear Eyes:     General: No scleral icterus. Cardiovascular:     Rate and Rhythm: Normal rate and regular rhythm.     Heart sounds: No murmur heard. Pulmonary:     Effort: Pulmonary effort is normal. No respiratory distress.     Breath sounds: Wheezing present.     Comments: Mild, diffuse expiratory wheezes Musculoskeletal:     Cervical back: Normal range of motion.  Skin:    General: Skin is warm and dry.  Neurological:     General: No focal deficit present.     Mental Status: She is alert and oriented to person, place, and time.  Psychiatric:        Mood and Affect: Mood normal.    ED Results / Procedures / Treatments   Labs (all labs ordered are listed, but only abnormal results are displayed) Labs Reviewed  RESP PANEL BY RT-PCR (FLU A&B, COVID) ARPGX2    EKG EKG Interpretation  Date/Time:  Friday January 30 2021 08:26:56 EDT Ventricular Rate:  89 PR Interval:  155 QRS Duration: 97 QT Interval:  359 QTC Calculation: 437 R Axis:   47 Text Interpretation: Sinus rhythm normal axis No acute ischemia Confirmed by Lorre Munroe ([id number]) on 01/30/2021 8:31:22 AM  Radiology DG Chest Port 1 View  Result Date: 01/30/2021 CLINICAL DATA:  Cough and congestion for 3 days EXAM: PORTABLE CHEST 1 VIEW COMPARISON:  05/19/2015 FINDINGS: The heart size and mediastinal contours are within normal limits. Both lungs are clear. The visualized skeletal structures are unremarkable. IMPRESSION: No acute abnormality of the lungs in AP portable projection. Electronically Signed   By: Eddie Candle M.D.   On: 01/30/2021 09:14    Procedures Procedures   Medications Ordered in ED Medications  albuterol (  VENTOLIN HFA) 108 (90 Base) MCG/ACT inhaler 2 puff (has no administration in time range)    ED Course  I have reviewed the triage vital signs and the nursing  notes.  Pertinent labs & imaging results that were available during my care of the patient were reviewed by me and considered in my medical decision making (see chart for details).    MDM Rules/Calculators/A&P                          Jayah Balthazar presents with several days of a respiratory illness.  She is well-appearing with normal vital signs including a normal oxygen saturation.  She tested negative for flu, COVID, and RSV.  X-ray was within normal limits and did not show pneumonia.  She did have some wheezing and was treated with an albuterol inhaler.  She can use this as needed wheezing.  I have also given her prescription for Tessalon Perles.  She was advised on further symptomatic management.  Return precautions were discussed. Final Clinical Impression(s) / ED Diagnoses Final diagnoses:  Bronchitis with acute wheezing    Rx / DC Orders ED Discharge Orders          Ordered    benzonatate (TESSALON) 100 MG capsule  Every 8 hours        01/30/21 Parachute, Tionne Dayhoff G, MD 01/30/21 [phone]

## 2021-01-30 NOTE — ED Notes (Signed)
ED Provider at bedside. 

## 2021-01-30 NOTE — ED Triage Notes (Signed)
Chest congestion d/t coughing for 2 days.  Low grade Temp of 99.0.

## 2021-02-15 ENCOUNTER — Encounter (HOSPITAL_BASED_OUTPATIENT_CLINIC_OR_DEPARTMENT_OTHER): Payer: Self-pay | Admitting: Emergency Medicine

## 2021-02-15 ENCOUNTER — Emergency Department (HOSPITAL_BASED_OUTPATIENT_CLINIC_OR_DEPARTMENT_OTHER)
Admission: EM | Admit: 2021-02-15 | Discharge: 2021-02-15 | Disposition: A | Discharge status: Home / Self Care | Payer: Medicare PPO | Attending: Emergency Medicine | Admitting: Emergency Medicine

## 2021-02-15 ENCOUNTER — Other Ambulatory Visit: Payer: Self-pay

## 2021-02-15 DIAGNOSIS — E119 Type 2 diabetes mellitus without complications: Secondary | ICD-10-CM | POA: Diagnosis not present

## 2021-02-15 DIAGNOSIS — Z79899 Other long term (current) drug therapy: Secondary | ICD-10-CM | POA: Diagnosis not present

## 2021-02-15 DIAGNOSIS — Z7982 Long term (current) use of aspirin: Secondary | ICD-10-CM | POA: Insufficient documentation

## 2021-02-15 DIAGNOSIS — U071 COVID-19: Secondary | ICD-10-CM | POA: Diagnosis not present

## 2021-02-15 DIAGNOSIS — Z853 Personal history of malignant neoplasm of breast: Secondary | ICD-10-CM | POA: Diagnosis not present

## 2021-02-15 DIAGNOSIS — J329 Chronic sinusitis, unspecified: Secondary | ICD-10-CM

## 2021-02-15 DIAGNOSIS — Z794 Long term (current) use of insulin: Secondary | ICD-10-CM | POA: Diagnosis not present

## 2021-02-15 DIAGNOSIS — J019 Acute sinusitis, unspecified: Secondary | ICD-10-CM | POA: Insufficient documentation

## 2021-02-15 DIAGNOSIS — R509 Fever, unspecified: Secondary | ICD-10-CM | POA: Diagnosis present

## 2021-02-15 DIAGNOSIS — Z9012 Acquired absence of left breast and nipple: Secondary | ICD-10-CM | POA: Insufficient documentation

## 2021-02-15 DIAGNOSIS — I10 Essential (primary) hypertension: Secondary | ICD-10-CM | POA: Insufficient documentation

## 2021-02-15 LAB — RESP PANEL BY RT-PCR (FLU A&B, COVID) ARPGX2
Influenza A by PCR: NEGATIVE
Influenza B by PCR: NEGATIVE
SARS Coronavirus 2 by RT PCR: POSITIVE — AB

## 2021-02-15 MED ORDER — AMOXICILLIN-POT CLAVULANATE 875-125 MG PO TABS
1.0000 | ORAL_TABLET | Freq: Two times a day (BID) | ORAL | 0 refills | Status: DC
Start: 2021-02-15 — End: 2022-09-26

## 2021-02-15 MED ORDER — BENZONATATE 100 MG PO CAPS
100.0000 mg | ORAL_CAPSULE | Freq: Three times a day (TID) | ORAL | 0 refills | Status: DC
Start: 2021-02-15 — End: 2021-02-15

## 2021-02-15 MED ORDER — HYDROCOD POLST-CPM POLST ER 10-8 MG/5ML PO SUER: 5.0000 mL | Freq: Two times a day (BID) | ORAL | 0 refills | Status: AC | PRN

## 2021-02-15 MED ORDER — ACETAMINOPHEN 325 MG PO TABS
650.0000 mg | ORAL_TABLET | Freq: Once | ORAL | Status: AC
  Administered 2021-02-15: 650 mg via ORAL

## 2021-02-15 MED ORDER — AMOXICILLIN-POT CLAVULANATE 875-125 MG PO TABS
1.0000 | ORAL_TABLET | Freq: Once | ORAL | Status: AC
  Administered 2021-02-15: 1 via ORAL
  Filled 2021-02-15: qty 1

## 2021-02-15 MED ORDER — ACETAMINOPHEN 325 MG PO TABS
ORAL_TABLET | ORAL | Status: AC
  Filled 2021-02-15: qty 2

## 2021-02-15 NOTE — ED Triage Notes (Signed)
  Patient comes in with flu like symptoms that have been going on for 2 days.  Patient states she has had a headache, body aches, fever, and productive cough.  Temp 101.1 on arrival.  No sick contacts that she is aware of.  Had cough/congestion 2 weeks ago and was treated.  Pain 8/10, throbbing headache.

## 2021-02-15 NOTE — Discharge Instructions (Addendum)
Take tylenol 2 pills 4 times a day and motrin 4 pills 3 times a day.  Drink plenty of fluids.  Return for worsening shortness of breath, headache, confusion. Follow up with your family doctor.   

## 2021-02-15 NOTE — ED Provider Notes (Signed)
[facility] EMERGENCY DEPT Provider Note   CSN: [id number] Arrival date & time: 02/15/21  3846     History Chief Complaint  Patient presents with   Generalized Body Aches   Fever    Dominique Dillon is a 65 y.o. female.  65 yo F with a chief complaints of sinus congestion and headache ear pain body aches fever.  Going on for a few days.  Patient has had recurrent episodes ever since she got her COVID vaccination.  Has been treated for sinusitis off and on.  Last seen in the ED a couple weeks ago and was diagnosed with bronchitis.  She denies any sick contacts.  Denies any difficulty breathing.  Denies nausea vomiting or diarrhea.  Denies abdominal pain.  Eating and drinking normally.  The history is provided by the patient.  Fever Associated symptoms: chills, congestion, cough, ear pain and myalgias   Associated symptoms: no chest pain, no dysuria, no headaches, no nausea, no rhinorrhea and no vomiting   Illness Severity:  Mild Onset quality:  Gradual Duration:  2 days Timing:  Constant Progression:  Worsening Chronicity:  Recurrent Associated symptoms: congestion, cough, ear pain, fever and myalgias   Associated symptoms: no chest pain, no headaches, no nausea, no rhinorrhea, no shortness of breath, no vomiting and no wheezing       Past Medical History:  Diagnosis Date   Allergic rhinitis    Arthritis    knees   Breast cancer of upper-outer quadrant of left female breast ([place]) 09/12/2014   DDD (degenerative disc disease), cervical    Depression    GERD (gastroesophageal reflux disease)    History of anemia    still takes iron supplement   Hypertension    under control with med., has been on med. since 1990s   Insulin dependent diabetes mellitus    Obesity    Personal history of chemotherapy    Personal history of radiation therapy    Seasonal asthma    no current med.    Patient Active Problem List   Diagnosis Date Noted   Dyslipidemia, goal LDL  below 100 65/99/3570   Metabolic syndrome 17/79/3903   Chest pain with moderate risk for cardiac etiology 05/28/2015   Right carotid bruit 05/28/2015   Intermittent claudication ([place]) 05/28/2015   Hypoxia 05/20/2015   Hypotension 05/20/2015   Chest pain 05/20/2015   Hypokalemia 05/20/2015   Prolonged QT interval 05/20/2015   Syncope 05/20/2015   Chest pain at rest 05/20/2015   Obesity, Class II, BMI 35-39.9    Medication adverse effect    Thalassemia 03/05/2015   Seasonal allergies 02/08/2015   Cancer associated pain 02/08/2015   Glaucoma 12/31/2014   Breast cancer of upper-outer quadrant of left female breast ([place]) 09/12/2014   Abscess of abdominal wall s/p I&D 7/30- & 03/08/2014 03/08/2014   Cellulitis 03/08/2014   Diabetes mellitus type 2, controlled ([place]) 03/08/2014   Essential hypertension, benign 03/08/2014   GERD (gastroesophageal reflux disease) 03/08/2014   Anemia in neoplastic disease 03/08/2014    Past Surgical History:  Procedure Laterality Date   ABDOMINAL HYSTERECTOMY  1995   complete   APPENDECTOMY  1980   BREAST BIOPSY Left 10/15/2015   BREAST BIOPSY Left 09/26/2015   BREAST BIOPSY Left 09/09/2014   BREAST LUMPECTOMY Left 10/01/2014   CARPAL TUNNEL RELEASE Right 08/06/2004   CESAREAN SECTION     x 2   CHOLECYSTECTOMY  1980   COLONOSCOPY     exploratory surgery of abdomen  11/1978   IRRIGATION AND DEBRIDEMENT ABSCESS N/A 03/08/2014   Procedure: IRRIGATION AND DEBRIDEMENT ABDOMINAL WALL ABSCESS;  Surgeon: Adin Hector, MD;  Location: [facility];  Service: General;  Laterality: N/A;   KNEE ARTHROSCOPY Bilateral    NM MYOVIEW LTD  06/2015   LOW RISK. HTN response to exercise. No EKG changes. Small, partially reversible apical defect (probably Breast Attenuation, but CRO apical Ischemia).  EF 63% with no RWMA   polyp removed from L breast     PORTACATH PLACEMENT Right 10/21/2014   Procedure: INSERTION PORT-A-CATH;  Surgeon: Rolm Bookbinder, MD;  Location:  [facility];  Service: General;  Laterality: Right;   RADIOACTIVE SEED GUIDED PARTIAL MASTECTOMY WITH AXILLARY SENTINEL LYMPH NODE BIOPSY Left 10/01/2014   Procedure: RADIOACTIVE SEED GUIDED LEFT BREAST LUMPECTOMY WITH LEFT  AXILLARY SENTINEL LYMPH NODE BIOPSY;  Surgeon: Rolm Bookbinder, MD;  Location: [facility];  Service: General;  Laterality: Left;   RE-EXCISION OF BREAST LUMPECTOMY Left 10/21/2014   Procedure: RE-EXCISION OF BREAST CANCER, POSTERIOR MARGINS;  Surgeon: Rolm Bookbinder, MD;  Location: [facility];  Service: General;  Laterality: Left;   [place] Right 08/06/2004   middle finger   UPPER GI ENDOSCOPY       OB History     Gravida  4   Para      Term      Preterm      AB  1   Living  2      SAB      IAB      Ectopic  1   Multiple      Live Births              Family History  Problem Relation Age of Onset   Diabetes Mother    Prostate cancer Father    Stomach cancer Maternal Aunt    Pancreatic cancer Maternal Uncle    Lung cancer Maternal Grandmother    Breast cancer Maternal Aunt    Pancreatic cancer Maternal Aunt    Pancreatic cancer Maternal Uncle    Heart attack Maternal Grandfather     Social History   Tobacco Use   Smoking status: Never   Smokeless tobacco: Never  Vaping Use   Vaping Use: Never used  Substance Use Topics   Alcohol use: No   Drug use: No    Home Medications Prior to Admission medications   Medication Sig Start Date End Date Taking? Authorizing Provider  amoxicillin-clavulanate (AUGMENTIN) 875-125 MG tablet Take 1 tablet by mouth every 12 (twelve) hours. 02/15/21  Yes Deno Etienne, DO  benzonatate (TESSALON) 100 MG capsule Take 1 capsule (100 mg total) by mouth every 8 (eight) hours. 02/15/21  Yes Deno Etienne, DO  aspirin 81 MG tablet Take 81 mg by mouth daily.    [provider]  cetirizine (ZYRTEC) 10 MG tablet Take 10 mg by  mouth daily.    [provider]  DULoxetine (CYMBALTA) 30 MG capsule Take 60 mg by mouth daily.    [provider]  gabapentin (NEURONTIN) 300 MG capsule Take 1 capsule (300 mg total) by mouth 3 (three) times daily as needed. 11/19/19   Melvenia Beam, MD  hydrochlorothiazide (HYDRODIURIL) 25 MG tablet Take 25 mg by mouth daily.    [provider]  insulin aspart (NOVOLOG FLEXPEN) 100 UNIT/ML FlexPen Inject 20 Units into the skin with breakfast, with lunch, and with evening meal.  Before carb meals.    [provider]  losartan (COZAAR) 25 MG tablet Take 1 tablet (25 mg total) by mouth daily. PLEASE SCHEDULE OFFICE VISIT FOR REFILLS OR GET FROM PCP 12/07/17   Leonie Man, MD  lovastatin (MEVACOR) 40 MG tablet Take 40 mg by mouth daily.    [provider]  metFORMIN (GLUCOPHAGE-XR) 500 MG 24 hr tablet Take 2 tablets by mouth in the morning and at bedtime. 09/10/19   [provider]  Multiple Vitamins-Minerals (CENTRUM WOMEN PO) Take 1 each by mouth daily.    [provider]  Omega-3 Fatty Acids (FISH OIL PO) Take 3 each by mouth daily.     [provider]  OZEMPIC, 1 MG/DOSE, 2 MG/1.5ML SOPN Inject 1 mg into the skin. On Wednesdays. 08/27/19   [provider]  pantoprazole (PROTONIX) 40 MG tablet Take 40 mg by mouth daily.    [provider]  TRESIBA FLEXTOUCH 100 UNIT/ML FlexTouch Pen Inject 28 Units into the skin daily. 11/07/19   [provider]    Allergies    Crestor [rosuvastatin], Humalog [insulin lispro], Lipitor [atorvastatin], Macrobid [nitrofurantoin monohyd macro], and Biaxin [clarithromycin]  Review of Systems   Review of Systems  Constitutional:  Positive for chills and fever.  HENT:  Positive for congestion, ear pain, sinus pressure and sinus pain. Negative for rhinorrhea.   Eyes:  Negative for redness and visual disturbance.  Respiratory:  Positive for cough. Negative for shortness  of breath and wheezing.   Cardiovascular:  Negative for chest pain and palpitations.  Gastrointestinal:  Negative for nausea and vomiting.  Genitourinary:  Negative for dysuria and urgency.  Musculoskeletal:  Positive for myalgias. Negative for arthralgias.  Skin:  Negative for pallor and wound.  Neurological:  Negative for dizziness and headaches.   Physical Exam Updated Vital Signs BP (!) 148/71 (BP Location: Right Arm)   Pulse (!) 104   Temp (!) 101.1 F (38.4 C) (Oral)   Resp 20   Ht 5\' 3"  (1.6 m)   Wt 83 kg   SpO2 96%   BMI 32.42 kg/m   Physical Exam Vitals and nursing note reviewed.  Constitutional:      General: She is not in acute distress.    Appearance: She is well-developed. She is not diaphoretic.  HENT:     Head: Normocephalic and atraumatic.     Comments: Swollen turbinates, posterior nasal drip, left TM with purulent effusion erythema and bulging.  Right TM not visualized due to wax.  Exquisitely tender to percussion to the right frontal sinus.  Eyes:     Pupils: Pupils are equal, round, and reactive to light.  Cardiovascular:     Rate and Rhythm: Normal rate and regular rhythm.     Heart sounds: No murmur heard.   No friction rub. No gallop.  Pulmonary:     Effort: Pulmonary effort is normal.     Breath sounds: No wheezing or rales.  Abdominal:     General: There is no distension.     Palpations: Abdomen is soft.     Tenderness: There is no abdominal tenderness.  Musculoskeletal:        General: No tenderness.     Cervical back: Normal range of motion and neck supple.  Skin:    General: Skin is warm and dry.  Neurological:     Mental Status: She is alert and oriented to person, place, and time.  Psychiatric:  Behavior: Behavior normal.    ED Results / Procedures / Treatments   Labs (all labs ordered are listed, but only abnormal results are displayed) Labs Reviewed  RESP PANEL BY RT-PCR (FLU A&B, COVID) ARPGX2     EKG None  Radiology No results found.  Procedures Procedures   Medications Ordered in ED Medications  amoxicillin-clavulanate (AUGMENTIN) 875-125 MG per tablet 1 tablet (has no administration in time range)  acetaminophen (TYLENOL) tablet 650 mg (650 mg Oral Given 02/15/21 0657)  acetaminophen (TYLENOL) 325 MG tablet (  Given 02/15/21 0702)    ED Course  I have reviewed the triage vital signs and the nursing notes.  Pertinent labs & imaging results that were available during my care of the patient were reviewed by me and considered in my medical decision making (see chart for details).    MDM Rules/Calculators/A&P                          66 yo F with a chief complaints of headache fever cough congestion myalgias going on for a couple days.  Well-appearing and nontoxic.  Clinically has left-sided otitis, with sinus pressure and pain on percussion and fever will cover with antibiotics for sinusitis.  This has been a frequent problem for her over the past 6 months or so.  Has sought multiple urgent care and ED visits.  Will give follow-up for ENT.  7:24 AM:  I have discussed the diagnosis/risks/treatment options with the patient and believe the pt to be eligible for discharge home to follow-up with PCP, ENT. We also discussed returning to the ED immediately if new or worsening sx occur. We discussed the sx which are most concerning (e.g., sudden worsening pain, fever, inability to tolerate by mouth) that necessitate immediate return. Medications administered to the patient during their visit and any new prescriptions provided to the patient are listed below.  Medications given during this visit Medications  amoxicillin-clavulanate (AUGMENTIN) 875-125 MG per tablet 1 tablet (has no administration in time range)  acetaminophen (TYLENOL) tablet 650 mg (650 mg Oral Given 02/15/21 0657)  acetaminophen (TYLENOL) 325 MG tablet (  Given 02/15/21 0702)     The patient appears reasonably  screen and/or stabilized for discharge and I doubt any other medical condition or other [facility] requiring further screening, evaluation, or treatment in the ED at this time prior to discharge.   Final Clinical Impression(s) / ED Diagnoses Final diagnoses:  Recurrent sinusitis    Rx / DC Orders ED Discharge Orders          Ordered    benzonatate (TESSALON) 100 MG capsule  Every 8 hours        02/15/21 0719    amoxicillin-clavulanate (AUGMENTIN) 875-125 MG tablet  Every 12 hours        02/15/21 0719             Deno Etienne, DO 02/15/21 [phone]

## 2021-02-25 ENCOUNTER — Ambulatory Visit: Payer: Medicare PPO

## 2021-02-28 DIAGNOSIS — Z20822 Contact with and (suspected) exposure to covid-19: Secondary | ICD-10-CM | POA: Diagnosis not present

## 2021-03-04 ENCOUNTER — Other Ambulatory Visit: Payer: Self-pay

## 2021-03-04 ENCOUNTER — Ambulatory Visit
Admission: RE | Admit: 2021-03-04 | Discharge: 2021-03-04 | Disposition: A | Discharge status: Home / Self Care | Payer: Medicare PPO | Source: Ambulatory Visit | Attending: Obstetrics and Gynecology | Admitting: Obstetrics and Gynecology

## 2021-03-04 DIAGNOSIS — Z1231 Encounter for screening mammogram for malignant neoplasm of breast: Secondary | ICD-10-CM

## 2021-03-05 DIAGNOSIS — J329 Chronic sinusitis, unspecified: Secondary | ICD-10-CM | POA: Diagnosis not present

## 2021-03-27 DIAGNOSIS — R519 Headache, unspecified: Secondary | ICD-10-CM | POA: Diagnosis not present

## 2021-03-27 DIAGNOSIS — J329 Chronic sinusitis, unspecified: Secondary | ICD-10-CM | POA: Diagnosis not present

## 2021-03-27 DIAGNOSIS — J342 Deviated nasal septum: Secondary | ICD-10-CM | POA: Diagnosis not present

## 2021-03-30 DIAGNOSIS — E7800 Pure hypercholesterolemia, unspecified: Secondary | ICD-10-CM | POA: Diagnosis not present

## 2021-03-30 DIAGNOSIS — E1165 Type 2 diabetes mellitus with hyperglycemia: Secondary | ICD-10-CM | POA: Diagnosis not present

## 2021-04-07 DIAGNOSIS — E1165 Type 2 diabetes mellitus with hyperglycemia: Secondary | ICD-10-CM | POA: Diagnosis not present

## 2021-04-07 DIAGNOSIS — I10 Essential (primary) hypertension: Secondary | ICD-10-CM | POA: Diagnosis not present

## 2021-04-07 DIAGNOSIS — E7800 Pure hypercholesterolemia, unspecified: Secondary | ICD-10-CM | POA: Diagnosis not present

## 2021-04-07 DIAGNOSIS — Z794 Long term (current) use of insulin: Secondary | ICD-10-CM | POA: Diagnosis not present

## 2021-04-07 DIAGNOSIS — R748 Abnormal levels of other serum enzymes: Secondary | ICD-10-CM | POA: Diagnosis not present

## 2021-04-16 DIAGNOSIS — R748 Abnormal levels of other serum enzymes: Secondary | ICD-10-CM | POA: Diagnosis not present

## 2021-04-16 DIAGNOSIS — K760 Fatty (change of) liver, not elsewhere classified: Secondary | ICD-10-CM | POA: Diagnosis not present

## 2021-04-20 DIAGNOSIS — H2513 Age-related nuclear cataract, bilateral: Secondary | ICD-10-CM | POA: Diagnosis not present

## 2021-04-20 DIAGNOSIS — H40023 Open angle with borderline findings, high risk, bilateral: Secondary | ICD-10-CM | POA: Diagnosis not present

## 2021-04-20 DIAGNOSIS — H524 Presbyopia: Secondary | ICD-10-CM | POA: Diagnosis not present

## 2021-04-20 DIAGNOSIS — H04123 Dry eye syndrome of bilateral lacrimal glands: Secondary | ICD-10-CM | POA: Diagnosis not present

## 2021-04-20 DIAGNOSIS — E119 Type 2 diabetes mellitus without complications: Secondary | ICD-10-CM | POA: Diagnosis not present

## 2021-05-19 DIAGNOSIS — R079 Chest pain, unspecified: Secondary | ICD-10-CM | POA: Diagnosis not present

## 2021-05-19 DIAGNOSIS — K219 Gastro-esophageal reflux disease without esophagitis: Secondary | ICD-10-CM | POA: Diagnosis not present

## 2021-05-19 DIAGNOSIS — E1165 Type 2 diabetes mellitus with hyperglycemia: Secondary | ICD-10-CM | POA: Diagnosis not present

## 2021-05-19 DIAGNOSIS — E7800 Pure hypercholesterolemia, unspecified: Secondary | ICD-10-CM | POA: Diagnosis not present

## 2021-05-19 DIAGNOSIS — Z794 Long term (current) use of insulin: Secondary | ICD-10-CM | POA: Diagnosis not present

## 2021-05-19 DIAGNOSIS — I10 Essential (primary) hypertension: Secondary | ICD-10-CM | POA: Diagnosis not present

## 2021-06-01 ENCOUNTER — Other Ambulatory Visit (HOSPITAL_COMMUNITY): Payer: Self-pay

## 2021-06-01 MED ORDER — OZEMPIC (2 MG/DOSE) 8 MG/3ML ~~LOC~~ SOPN
PEN_INJECTOR | SUBCUTANEOUS | 2 refills | Status: DC
  Filled 2021-06-01: qty 3, 28d supply, fill #0
  Filled 2021-09-29: qty 3, 28d supply, fill #1

## 2021-06-25 DIAGNOSIS — E7800 Pure hypercholesterolemia, unspecified: Secondary | ICD-10-CM | POA: Diagnosis not present

## 2021-06-25 DIAGNOSIS — I10 Essential (primary) hypertension: Secondary | ICD-10-CM | POA: Diagnosis not present

## 2021-06-25 DIAGNOSIS — E1165 Type 2 diabetes mellitus with hyperglycemia: Secondary | ICD-10-CM | POA: Diagnosis not present

## 2021-06-30 DIAGNOSIS — M542 Cervicalgia: Secondary | ICD-10-CM | POA: Diagnosis not present

## 2021-06-30 DIAGNOSIS — E7800 Pure hypercholesterolemia, unspecified: Secondary | ICD-10-CM | POA: Diagnosis not present

## 2021-06-30 DIAGNOSIS — Z794 Long term (current) use of insulin: Secondary | ICD-10-CM | POA: Diagnosis not present

## 2021-06-30 DIAGNOSIS — E1165 Type 2 diabetes mellitus with hyperglycemia: Secondary | ICD-10-CM | POA: Diagnosis not present

## 2021-06-30 DIAGNOSIS — I10 Essential (primary) hypertension: Secondary | ICD-10-CM | POA: Diagnosis not present

## 2021-06-30 DIAGNOSIS — Z7982 Long term (current) use of aspirin: Secondary | ICD-10-CM | POA: Diagnosis not present

## 2021-07-13 DIAGNOSIS — L299 Pruritus, unspecified: Secondary | ICD-10-CM | POA: Diagnosis not present

## 2021-07-14 ENCOUNTER — Other Ambulatory Visit (HOSPITAL_COMMUNITY): Payer: Self-pay

## 2021-07-14 MED ORDER — OZEMPIC (1 MG/DOSE) 4 MG/3ML ~~LOC~~ SOPN
PEN_INJECTOR | SUBCUTANEOUS | 5 refills | Status: DC
  Filled 2021-07-14: qty 3, 30d supply, fill #0

## 2021-07-22 DIAGNOSIS — H04123 Dry eye syndrome of bilateral lacrimal glands: Secondary | ICD-10-CM | POA: Diagnosis not present

## 2021-07-30 DIAGNOSIS — Z79899 Other long term (current) drug therapy: Secondary | ICD-10-CM | POA: Diagnosis not present

## 2021-07-30 DIAGNOSIS — E7800 Pure hypercholesterolemia, unspecified: Secondary | ICD-10-CM | POA: Diagnosis not present

## 2021-07-30 DIAGNOSIS — Z794 Long term (current) use of insulin: Secondary | ICD-10-CM | POA: Diagnosis not present

## 2021-07-30 DIAGNOSIS — Z7982 Long term (current) use of aspirin: Secondary | ICD-10-CM | POA: Diagnosis not present

## 2021-07-30 DIAGNOSIS — E1165 Type 2 diabetes mellitus with hyperglycemia: Secondary | ICD-10-CM | POA: Diagnosis not present

## 2021-07-30 DIAGNOSIS — I10 Essential (primary) hypertension: Secondary | ICD-10-CM | POA: Diagnosis not present

## 2021-08-26 ENCOUNTER — Other Ambulatory Visit (HOSPITAL_COMMUNITY): Payer: Self-pay

## 2021-08-26 MED ORDER — OZEMPIC (1 MG/DOSE) 4 MG/3ML ~~LOC~~ SOPN
PEN_INJECTOR | SUBCUTANEOUS | 3 refills | Status: DC
  Filled 2021-08-26: qty 3, 28d supply, fill #0

## 2021-08-31 ENCOUNTER — Other Ambulatory Visit (HOSPITAL_COMMUNITY): Payer: Self-pay

## 2021-08-31 MED ORDER — TRESIBA FLEXTOUCH 100 UNIT/ML ~~LOC~~ SOPN: PEN_INJECTOR | SUBCUTANEOUS | 0 refills | Status: DC

## 2021-08-31 MED ORDER — TRESIBA FLEXTOUCH 100 UNIT/ML ~~LOC~~ SOPN
PEN_INJECTOR | SUBCUTANEOUS | 3 refills | Status: DC
  Filled 2021-08-31: qty 15, 30d supply, fill #0

## 2021-09-03 DIAGNOSIS — N390 Urinary tract infection, site not specified: Secondary | ICD-10-CM | POA: Diagnosis not present

## 2021-09-28 DIAGNOSIS — Z79899 Other long term (current) drug therapy: Secondary | ICD-10-CM | POA: Diagnosis not present

## 2021-09-28 DIAGNOSIS — I10 Essential (primary) hypertension: Secondary | ICD-10-CM | POA: Diagnosis not present

## 2021-09-28 DIAGNOSIS — E7800 Pure hypercholesterolemia, unspecified: Secondary | ICD-10-CM | POA: Diagnosis not present

## 2021-09-28 DIAGNOSIS — Z7982 Long term (current) use of aspirin: Secondary | ICD-10-CM | POA: Diagnosis not present

## 2021-09-28 DIAGNOSIS — E1165 Type 2 diabetes mellitus with hyperglycemia: Secondary | ICD-10-CM | POA: Diagnosis not present

## 2021-09-30 ENCOUNTER — Other Ambulatory Visit (HOSPITAL_COMMUNITY): Payer: Self-pay

## 2021-10-05 DIAGNOSIS — R300 Dysuria: Secondary | ICD-10-CM | POA: Diagnosis not present

## 2021-10-07 DIAGNOSIS — K219 Gastro-esophageal reflux disease without esophagitis: Secondary | ICD-10-CM | POA: Diagnosis not present

## 2021-10-07 DIAGNOSIS — I10 Essential (primary) hypertension: Secondary | ICD-10-CM | POA: Diagnosis not present

## 2021-10-07 DIAGNOSIS — E7800 Pure hypercholesterolemia, unspecified: Secondary | ICD-10-CM | POA: Diagnosis not present

## 2021-10-07 DIAGNOSIS — E1165 Type 2 diabetes mellitus with hyperglycemia: Secondary | ICD-10-CM | POA: Diagnosis not present

## 2021-10-07 DIAGNOSIS — Z0000 Encounter for general adult medical examination without abnormal findings: Secondary | ICD-10-CM | POA: Diagnosis not present

## 2021-10-07 DIAGNOSIS — N390 Urinary tract infection, site not specified: Secondary | ICD-10-CM | POA: Diagnosis not present

## 2021-10-08 ENCOUNTER — Other Ambulatory Visit (HOSPITAL_COMMUNITY): Payer: Self-pay

## 2021-10-08 DIAGNOSIS — I10 Essential (primary) hypertension: Secondary | ICD-10-CM | POA: Diagnosis not present

## 2021-10-08 DIAGNOSIS — E1165 Type 2 diabetes mellitus with hyperglycemia: Secondary | ICD-10-CM | POA: Diagnosis not present

## 2021-10-08 DIAGNOSIS — E7800 Pure hypercholesterolemia, unspecified: Secondary | ICD-10-CM | POA: Diagnosis not present

## 2021-10-08 MED ORDER — OZEMPIC (1 MG/DOSE) 4 MG/3ML ~~LOC~~ SOPN
PEN_INJECTOR | SUBCUTANEOUS | 3 refills | Status: DC
  Filled 2021-10-08: qty 3, 28d supply, fill #0

## 2021-10-14 ENCOUNTER — Other Ambulatory Visit (HOSPITAL_COMMUNITY): Payer: Self-pay

## 2021-12-14 DIAGNOSIS — N3000 Acute cystitis without hematuria: Secondary | ICD-10-CM | POA: Diagnosis not present

## 2021-12-31 DIAGNOSIS — B349 Viral infection, unspecified: Secondary | ICD-10-CM | POA: Diagnosis not present

## 2022-01-09 ENCOUNTER — Other Ambulatory Visit (HOSPITAL_COMMUNITY): Payer: Self-pay

## 2022-01-12 DIAGNOSIS — E1169 Type 2 diabetes mellitus with other specified complication: Secondary | ICD-10-CM | POA: Diagnosis not present

## 2022-01-12 DIAGNOSIS — I10 Essential (primary) hypertension: Secondary | ICD-10-CM | POA: Diagnosis not present

## 2022-01-12 DIAGNOSIS — F339 Major depressive disorder, recurrent, unspecified: Secondary | ICD-10-CM | POA: Diagnosis not present

## 2022-01-12 DIAGNOSIS — E1165 Type 2 diabetes mellitus with hyperglycemia: Secondary | ICD-10-CM | POA: Diagnosis not present

## 2022-01-12 DIAGNOSIS — Z794 Long term (current) use of insulin: Secondary | ICD-10-CM | POA: Diagnosis not present

## 2022-01-12 DIAGNOSIS — E7800 Pure hypercholesterolemia, unspecified: Secondary | ICD-10-CM | POA: Diagnosis not present

## 2022-01-18 DIAGNOSIS — H04123 Dry eye syndrome of bilateral lacrimal glands: Secondary | ICD-10-CM | POA: Diagnosis not present

## 2022-01-18 DIAGNOSIS — H40023 Open angle with borderline findings, high risk, bilateral: Secondary | ICD-10-CM | POA: Diagnosis not present

## 2022-01-28 ENCOUNTER — Other Ambulatory Visit: Payer: Self-pay | Admitting: Obstetrics and Gynecology

## 2022-01-28 DIAGNOSIS — Z1231 Encounter for screening mammogram for malignant neoplasm of breast: Secondary | ICD-10-CM

## 2022-02-25 DIAGNOSIS — Z780 Asymptomatic menopausal state: Secondary | ICD-10-CM | POA: Diagnosis not present

## 2022-03-05 ENCOUNTER — Ambulatory Visit
Admission: RE | Admit: 2022-03-05 | Discharge: 2022-03-05 | Disposition: A | Discharge status: Home / Self Care | Payer: Medicare PPO | Source: Ambulatory Visit | Attending: Obstetrics and Gynecology | Admitting: Obstetrics and Gynecology

## 2022-03-05 DIAGNOSIS — Z1231 Encounter for screening mammogram for malignant neoplasm of breast: Secondary | ICD-10-CM

## 2022-04-08 DIAGNOSIS — E1165 Type 2 diabetes mellitus with hyperglycemia: Secondary | ICD-10-CM | POA: Diagnosis not present

## 2022-04-08 DIAGNOSIS — E7800 Pure hypercholesterolemia, unspecified: Secondary | ICD-10-CM | POA: Diagnosis not present

## 2022-04-08 DIAGNOSIS — I10 Essential (primary) hypertension: Secondary | ICD-10-CM | POA: Diagnosis not present

## 2022-04-20 DIAGNOSIS — E7800 Pure hypercholesterolemia, unspecified: Secondary | ICD-10-CM | POA: Diagnosis not present

## 2022-04-20 DIAGNOSIS — Z794 Long term (current) use of insulin: Secondary | ICD-10-CM | POA: Diagnosis not present

## 2022-04-20 DIAGNOSIS — E1169 Type 2 diabetes mellitus with other specified complication: Secondary | ICD-10-CM | POA: Diagnosis not present

## 2022-04-20 DIAGNOSIS — I10 Essential (primary) hypertension: Secondary | ICD-10-CM | POA: Diagnosis not present

## 2022-05-25 ENCOUNTER — Other Ambulatory Visit (HOSPITAL_BASED_OUTPATIENT_CLINIC_OR_DEPARTMENT_OTHER): Payer: Self-pay

## 2022-05-25 ENCOUNTER — Other Ambulatory Visit (HOSPITAL_COMMUNITY): Payer: Self-pay

## 2022-05-25 DIAGNOSIS — I10 Essential (primary) hypertension: Secondary | ICD-10-CM | POA: Diagnosis not present

## 2022-05-25 DIAGNOSIS — E1169 Type 2 diabetes mellitus with other specified complication: Secondary | ICD-10-CM | POA: Diagnosis not present

## 2022-05-25 DIAGNOSIS — Z794 Long term (current) use of insulin: Secondary | ICD-10-CM | POA: Diagnosis not present

## 2022-05-25 DIAGNOSIS — E7800 Pure hypercholesterolemia, unspecified: Secondary | ICD-10-CM | POA: Diagnosis not present

## 2022-05-25 DIAGNOSIS — R300 Dysuria: Secondary | ICD-10-CM | POA: Diagnosis not present

## 2022-05-25 MED ORDER — OZEMPIC (2 MG/DOSE) 8 MG/3ML ~~LOC~~ SOPN
2.0000 mg | PEN_INJECTOR | SUBCUTANEOUS | 3 refills | Status: DC
  Filled 2022-05-25 (×2): qty 3, 28d supply, fill #0

## 2022-05-25 MED ORDER — TRESIBA FLEXTOUCH 100 UNIT/ML ~~LOC~~ SOPN
30.0000 [IU] | PEN_INJECTOR | Freq: Every day | SUBCUTANEOUS | 3 refills | Status: AC
  Filled 2022-05-25 (×2): qty 15, 30d supply, fill #0

## 2022-06-09 DIAGNOSIS — E1165 Type 2 diabetes mellitus with hyperglycemia: Secondary | ICD-10-CM | POA: Diagnosis not present

## 2022-06-09 DIAGNOSIS — K529 Noninfective gastroenteritis and colitis, unspecified: Secondary | ICD-10-CM | POA: Diagnosis not present

## 2022-06-10 DIAGNOSIS — K529 Noninfective gastroenteritis and colitis, unspecified: Secondary | ICD-10-CM | POA: Diagnosis not present

## 2022-06-15 ENCOUNTER — Other Ambulatory Visit (HOSPITAL_BASED_OUTPATIENT_CLINIC_OR_DEPARTMENT_OTHER): Payer: Self-pay

## 2022-07-29 DIAGNOSIS — H25813 Combined forms of age-related cataract, bilateral: Secondary | ICD-10-CM | POA: Diagnosis not present

## 2022-07-29 DIAGNOSIS — H35362 Drusen (degenerative) of macula, left eye: Secondary | ICD-10-CM | POA: Diagnosis not present

## 2022-07-29 DIAGNOSIS — H40023 Open angle with borderline findings, high risk, bilateral: Secondary | ICD-10-CM | POA: Diagnosis not present

## 2022-07-29 DIAGNOSIS — H524 Presbyopia: Secondary | ICD-10-CM | POA: Diagnosis not present

## 2022-07-29 DIAGNOSIS — E119 Type 2 diabetes mellitus without complications: Secondary | ICD-10-CM | POA: Diagnosis not present

## 2022-07-30 DIAGNOSIS — J40 Bronchitis, not specified as acute or chronic: Secondary | ICD-10-CM | POA: Diagnosis not present

## 2022-07-30 DIAGNOSIS — Z20822 Contact with and (suspected) exposure to covid-19: Secondary | ICD-10-CM | POA: Diagnosis not present

## 2022-07-30 DIAGNOSIS — R062 Wheezing: Secondary | ICD-10-CM | POA: Diagnosis not present

## 2022-07-30 DIAGNOSIS — R051 Acute cough: Secondary | ICD-10-CM | POA: Diagnosis not present

## 2022-08-10 DIAGNOSIS — R194 Change in bowel habit: Secondary | ICD-10-CM | POA: Diagnosis not present

## 2022-08-10 DIAGNOSIS — K219 Gastro-esophageal reflux disease without esophagitis: Secondary | ICD-10-CM | POA: Diagnosis not present

## 2022-08-26 DIAGNOSIS — I10 Essential (primary) hypertension: Secondary | ICD-10-CM | POA: Diagnosis not present

## 2022-08-26 DIAGNOSIS — R300 Dysuria: Secondary | ICD-10-CM | POA: Diagnosis not present

## 2022-08-26 DIAGNOSIS — E1169 Type 2 diabetes mellitus with other specified complication: Secondary | ICD-10-CM | POA: Diagnosis not present

## 2022-08-26 DIAGNOSIS — Z794 Long term (current) use of insulin: Secondary | ICD-10-CM | POA: Diagnosis not present

## 2022-08-26 DIAGNOSIS — E7800 Pure hypercholesterolemia, unspecified: Secondary | ICD-10-CM | POA: Diagnosis not present

## 2022-08-26 DIAGNOSIS — E1165 Type 2 diabetes mellitus with hyperglycemia: Secondary | ICD-10-CM | POA: Diagnosis not present

## 2022-08-31 ENCOUNTER — Other Ambulatory Visit (HOSPITAL_COMMUNITY): Payer: Self-pay | Admitting: Orthopedic Surgery

## 2022-08-31 ENCOUNTER — Ambulatory Visit (HOSPITAL_COMMUNITY)
Admission: RE | Admit: 2022-08-31 | Discharge: 2022-08-31 | Disposition: A | Discharge status: Home / Self Care | Payer: Medicare PPO | Source: Ambulatory Visit | Attending: Cardiology | Admitting: Cardiology

## 2022-08-31 DIAGNOSIS — M79601 Pain in right arm: Secondary | ICD-10-CM

## 2022-09-01 DIAGNOSIS — J0111 Acute recurrent frontal sinusitis: Secondary | ICD-10-CM | POA: Diagnosis not present

## 2022-09-06 DIAGNOSIS — H9312 Tinnitus, left ear: Secondary | ICD-10-CM | POA: Diagnosis not present

## 2022-09-06 DIAGNOSIS — I10 Essential (primary) hypertension: Secondary | ICD-10-CM | POA: Diagnosis not present

## 2022-09-10 DIAGNOSIS — H9313 Tinnitus, bilateral: Secondary | ICD-10-CM | POA: Diagnosis not present

## 2022-09-10 DIAGNOSIS — H903 Sensorineural hearing loss, bilateral: Secondary | ICD-10-CM | POA: Diagnosis not present

## 2022-09-20 DIAGNOSIS — R5383 Other fatigue: Secondary | ICD-10-CM | POA: Diagnosis not present

## 2022-09-20 DIAGNOSIS — H9312 Tinnitus, left ear: Secondary | ICD-10-CM | POA: Diagnosis not present

## 2022-09-20 DIAGNOSIS — I10 Essential (primary) hypertension: Secondary | ICD-10-CM | POA: Diagnosis not present

## 2022-09-21 DIAGNOSIS — T162XXA Foreign body in left ear, initial encounter: Secondary | ICD-10-CM | POA: Diagnosis not present

## 2022-09-26 ENCOUNTER — Encounter (HOSPITAL_BASED_OUTPATIENT_CLINIC_OR_DEPARTMENT_OTHER): Payer: Self-pay

## 2022-09-26 ENCOUNTER — Emergency Department (HOSPITAL_BASED_OUTPATIENT_CLINIC_OR_DEPARTMENT_OTHER)
Admission: EM | Admit: 2022-09-26 | Discharge: 2022-09-26 | Disposition: A | Discharge status: Home / Self Care | Payer: Medicare PPO | Attending: Emergency Medicine | Admitting: Emergency Medicine

## 2022-09-26 DIAGNOSIS — R739 Hyperglycemia, unspecified: Secondary | ICD-10-CM | POA: Insufficient documentation

## 2022-09-26 DIAGNOSIS — J0110 Acute frontal sinusitis, unspecified: Secondary | ICD-10-CM | POA: Insufficient documentation

## 2022-09-26 DIAGNOSIS — R519 Headache, unspecified: Secondary | ICD-10-CM | POA: Diagnosis present

## 2022-09-26 DIAGNOSIS — Z7982 Long term (current) use of aspirin: Secondary | ICD-10-CM | POA: Diagnosis not present

## 2022-09-26 LAB — CBG MONITORING, ED: Glucose-Capillary: 241 mg/dL — ABNORMAL HIGH (ref 70–99)

## 2022-09-26 MED ORDER — AMOXICILLIN-POT CLAVULANATE 875-125 MG PO TABS: 1.0000 | ORAL_TABLET | Freq: Two times a day (BID) | ORAL | 0 refills | Status: DC

## 2022-09-26 MED ORDER — AMOXICILLIN-POT CLAVULANATE 875-125 MG PO TABS
1.0000 | ORAL_TABLET | Freq: Once | ORAL | Status: AC
  Administered 2022-09-26: 1 via ORAL
  Filled 2022-09-26: qty 1

## 2022-09-26 NOTE — ED Provider Notes (Signed)
[facility] Provider Note   CSN: [id number] Arrival date & time: 09/26/22  1727     History  Chief Complaint  Patient presents with   Hyperglycemia   Headache   Tinnitus    Dominique Dillon is a 67 y.o. female.  67 yo F with a cc of headache.  This been going on for couple weeks.  She tells me its to the vertex of her head.  Feels like pins are up there.  She also has been having some ringing to her left ear.  She has seen ENT as well as her family doctor for these things.  She recently finished a round of doxycycline.  Denies head injury denies neck pain.  She did have a procedure done on her right shoulder a few weeks ago.  Is wondering if the nerve block is causing her headache.   Hyperglycemia Headache      Home Medications Prior to Admission medications   Medication Sig Start Date End Date Taking? Authorizing Provider  amoxicillin-clavulanate (AUGMENTIN) 875-125 MG tablet Take 1 tablet by mouth every 12 (twelve) hours. 09/26/22  Yes Deno Etienne, DO  aspirin 81 MG tablet Take 81 mg by mouth daily.    [provider]  cetirizine (ZYRTEC) 10 MG tablet Take 10 mg by mouth daily.    [provider]  chlorpheniramine-HYDROcodone (TUSSIONEX PENNKINETIC ER) 10-8 MG/5ML SUER Take 5 mLs by mouth every 12 (twelve) hours as needed for cough. 02/15/21   Deno Etienne, DO  DULoxetine (CYMBALTA) 30 MG capsule Take 60 mg by mouth daily.    [provider]  gabapentin (NEURONTIN) 300 MG capsule Take 1 capsule (300 mg total) by mouth 3 (three) times daily as needed. 11/19/19   Melvenia Beam, MD  hydrochlorothiazide (HYDRODIURIL) 25 MG tablet Take 25 mg by mouth daily.    [provider]  insulin aspart (NOVOLOG FLEXPEN) 100 UNIT/ML FlexPen Inject 20 Units into the skin with breakfast, with lunch, and with evening meal. Before carb meals.    [provider]  insulin degludec (TRESIBA FLEXTOUCH) 100 UNIT/ML  FlexTouch Pen Inject 36 units subcutaneously daily, Increase as directed --max daily dose is 50 units 08/31/21     insulin degludec (TRESIBA FLEXTOUCH) 100 UNIT/ML FlexTouch Pen Inject 30 Units into the skin daily (Increase as directed. Max daily dose 50 units daily) 05/25/22     losartan (COZAAR) 25 MG tablet Take 1 tablet (25 mg total) by mouth daily. PLEASE SCHEDULE OFFICE VISIT FOR REFILLS OR GET FROM PCP 12/07/17   Leonie Man, MD  lovastatin (MEVACOR) 40 MG tablet Take 40 mg by mouth daily.    [provider]  metFORMIN (GLUCOPHAGE-XR) 500 MG 24 hr tablet Take 2 tablets by mouth in the morning and at bedtime. 09/10/19   [provider]  Multiple Vitamins-Minerals (CENTRUM WOMEN PO) Take 1 each by mouth daily.    [provider]  Omega-3 Fatty Acids (FISH OIL PO) Take 3 each by mouth daily.     [provider]  OZEMPIC, 1 MG/DOSE, 2 MG/1.5ML SOPN Inject 1 mg into the skin. On Wednesdays. 08/27/19   [provider]  pantoprazole (PROTONIX) 40 MG tablet Take 40 mg by mouth daily.    [provider]  Semaglutide, 1 MG/DOSE, (OZEMPIC, 1 MG/DOSE,) 4 MG/3ML SOPN Inject 1 mg into the skin once a week 07/14/21     Semaglutide, 1 MG/DOSE, (OZEMPIC, 1 MG/DOSE,) 4 MG/3ML SOPN Inject 1  mg into the skin once a week 08/26/21     Semaglutide, 1 MG/DOSE, (OZEMPIC, 1 MG/DOSE,) 4 MG/3ML SOPN Inject 1 mg once a  weekly. (dose decrease) 10/08/21     Semaglutide, 2 MG/DOSE, (OZEMPIC, 2 MG/DOSE,) 8 MG/3ML SOPN Inject 2 mg subcutaneous once a week 06/01/21     Semaglutide, 2 MG/DOSE, (OZEMPIC, 2 MG/DOSE,) 8 MG/3ML SOPN Inject 2 mg into the skin once a week. 05/25/22     TRESIBA FLEXTOUCH 100 UNIT/ML FlexTouch Pen Inject 28 Units into the skin daily. 11/07/19   [provider]      Allergies    Crestor [rosuvastatin], Humalog [insulin lispro], Lipitor [atorvastatin], Macrobid [nitrofurantoin monohyd macro], and Biaxin [clarithromycin]    Review of Systems    Review of Systems  Neurological:  Positive for headaches.    Physical Exam Updated Vital Signs BP (!) 149/91 (BP Location: Right Arm)   Pulse (!) 102   Temp 98.2 F (36.8 C)   Resp 20   SpO2 99%  Physical Exam Vitals and nursing note reviewed.  Constitutional:      General: She is not in acute distress.    Appearance: She is well-developed. She is not diaphoretic.  HENT:     Head: Normocephalic and atraumatic.     Comments: Swollen turbinates, posterior nasal drip, no noted sinus ttp, tm normal bilaterally.   Patient has some pain with palpation of the vertex of her head.  I see no obvious skin lesions there. Eyes:     Pupils: Pupils are equal, round, and reactive to light.  Cardiovascular:     Rate and Rhythm: Normal rate and regular rhythm.     Heart sounds: No murmur heard.    No friction rub. No gallop.  Pulmonary:     Effort: Pulmonary effort is normal.     Breath sounds: No wheezing or rales.  Abdominal:     General: There is no distension.     Palpations: Abdomen is soft.     Tenderness: There is no abdominal tenderness.  Musculoskeletal:        General: No tenderness.     Cervical back: Normal range of motion and neck supple.  Skin:    General: Skin is warm and dry.  Neurological:     Mental Status: She is alert and oriented to person, place, and time.     Cranial Nerves: Cranial nerves 2-12 are intact.     Sensory: Sensation is intact.     Motor: Motor function is intact.     Coordination: Coordination is intact.     Comments: Benign neurologic exam.  Ambulates without issue.  Psychiatric:        Behavior: Behavior normal.     ED Results / Procedures / Treatments   Labs (all labs ordered are listed, but only abnormal results are displayed) Labs Reviewed  CBG MONITORING, ED - Abnormal; Notable for the following components:      Result Value   Glucose-Capillary 241 (*)    All other components within normal limits    EKG None  Radiology No  results found.  Procedures Procedures    Medications Ordered in ED Medications  amoxicillin-clavulanate (AUGMENTIN) 875-125 MG per tablet 1 tablet (has no administration in time range)    ED Course/ Medical Decision Making/ A&P                             Medical Decision Making  Risk Prescription drug management.   67 yo F with a chief complaints of headache.  Going on for a little bit over a week.  Has been having some ringing in her left ear as well.  On my exam the patient does have signs of an upper respiratory illness.  She also has palpable tenderness to the vertex of her head.  I wonder if her symptoms may be due to her artificial hair.  She does have signs of an upper respiratory illness and is complaining of a headache will start her on medication for possible sinusitis.  Encouraged her to follow-up with her family doctor in the office.  7:48 PM:  I have discussed the diagnosis/risks/treatment options with the patient.  Evaluation and diagnostic testing in the emergency department does not suggest an emergent condition requiring admission or immediate intervention beyond what has been performed at this time.  They will follow up with PCP. We also discussed returning to the ED immediately if new or worsening sx occur. We discussed the sx which are most concerning (e.g., sudden worsening pain, fever, inability to tolerate by mouth) that necessitate immediate return. Medications administered to the patient during their visit and any new prescriptions provided to the patient are listed below.  Medications given during this visit Medications  amoxicillin-clavulanate (AUGMENTIN) 875-125 MG per tablet 1 tablet (has no administration in time range)     The patient appears reasonably screen and/or stabilized for discharge and I doubt any other medical condition or other [facility] requiring further screening, evaluation, or treatment in the ED at this time prior to discharge.           Final Clinical Impression(s) / ED Diagnoses Final diagnoses:  Acute frontal sinusitis, recurrence not specified    Rx / DC Orders ED Discharge Orders          Ordered    amoxicillin-clavulanate (AUGMENTIN) 875-125 MG tablet  Every 12 hours        09/26/22 1944              Deno Etienne, DO 09/26/22 1948

## 2022-09-26 NOTE — ED Triage Notes (Signed)
Pt c/o tinnitus x 2wks, followed up w ENT & advised "it is from aging." Hx diabetes, states it is 384, also c/o HA "& down my neck." States sometimes BP is high "but today it might be okay."  Pt also states "my face is a little numb, but my PCP said I wasn't having a stroke." Stroke screen clear in triage, no unilateral deficit noted.

## 2022-09-26 NOTE — Discharge Instructions (Signed)
You can take Tylenol and/or ibuprofen or naproxen for your discomfort.  Please follow-up with your family doctor in the office.

## 2022-09-30 ENCOUNTER — Telehealth: Payer: Self-pay

## 2022-09-30 NOTE — Telephone Encounter (Signed)
        Patient  visited [facility] on 2/18    Telephone encounter attempt :   1st  A HIPAA compliant voice message was left requesting a return call.  Instructed patient to call back    [facility] [phone] [address], [place], [place] [address] Phone: [phone] Email: [email or web address]

## 2022-10-01 ENCOUNTER — Telehealth: Payer: Self-pay

## 2022-10-01 NOTE — Telephone Encounter (Signed)
     Patient  visit on 2/18  at [facility] Have you been able to follow up with your primary care physician? Yes   The patient was or was not able to obtain any needed medicine or equipment. Yes   Are there diet recommendations that you are having difficulty following? Na   Patient expresses understanding of discharge instructions and education provided has no other needs at this time.  Yes      [facility] [phone] [address], [place], [place] [address] Phone: [phone] Email: [email or web address]

## 2022-10-05 ENCOUNTER — Telehealth: Payer: Self-pay | Admitting: Pharmacist

## 2022-10-05 NOTE — Progress Notes (Signed)
[facility])  [place] Team    10/05/2022  Dominique Dillon [birth year] [id number]  Reason for referral: Medication Assistance with Tyler Aas and Novolog   Referral source:  [place]  Current insurance: [employer]   Outreach:  Successful telephone call with [name].  HIPAA identifiers verified.   Allergies  Allergen Reactions   Crestor [Rosuvastatin]     Chest pain, aches   Humalog [Insulin Lispro]    Lipitor [Atorvastatin]     Chest pain, aches   Macrobid [Nitrofurantoin Monohyd Macro] Hives and Itching    Chest pain   Biaxin [Clarithromycin] Other (See Comments)    UNKNOWN    Medication Assistance Findings:   Extra Help:  May be eligible for   Extra Help Low Income Subsidy based on reported income and assets  Patient Assistance Programs: Tyler Aas made by [employer] requirement met: Yes Out-of-pocket prescription expenditure met:   Not Applicable Patient has met application requirements to apply for this program.    Novolog made by [employer] requirement met: Yes Out-of-pocket prescription expenditure met:   Not Applicable Patient has met application requirements to apply for this program.    Additional medication assistance options reviewed with patient as warranted:  No other options identified  Plan: I will route patient assistance letter to [place] technician who will coordinate patient assistance program application process for medications listed above.  [facility] pharmacy technician will assist with obtaining all required documents from both patient and provider(s) and submit application(s) once completed.    Loretha Brasil, PharmD [place] Clinical Pharmacist Direct Dial: [phone]

## 2022-10-07 ENCOUNTER — Telehealth: Payer: Self-pay | Admitting: Pharmacy Technician

## 2022-10-07 DIAGNOSIS — Z794 Long term (current) use of insulin: Secondary | ICD-10-CM | POA: Diagnosis not present

## 2022-10-07 DIAGNOSIS — E1169 Type 2 diabetes mellitus with other specified complication: Secondary | ICD-10-CM | POA: Diagnosis not present

## 2022-10-07 DIAGNOSIS — I10 Essential (primary) hypertension: Secondary | ICD-10-CM | POA: Diagnosis not present

## 2022-10-07 DIAGNOSIS — Z596 Low income: Secondary | ICD-10-CM

## 2022-10-07 DIAGNOSIS — E7800 Pure hypercholesterolemia, unspecified: Secondary | ICD-10-CM | POA: Diagnosis not present

## 2022-10-07 NOTE — Progress Notes (Signed)
[facility])                                            [place] Team    10/07/2022  Jacki Lebourgeois 10/07/1955 [id number]                                      Medication Assistance Referral  Referral From: [facility]  Medication/Company: [employer] / [employer] Patient application portion:  Education officer, museum portion: Faxed  to Dr. Deland Pretty Provider address/fax verified via: Office website  Medication/Company: Tyler Aas / [employer] Patient application portion:  Education officer, museum portion: Faxed  to Dr. Deland Pretty Provider address/fax verified via: Office website    Nomi Rudnicki P. Anjanette Gilkey, [place]  [phone]

## 2022-10-14 DIAGNOSIS — I10 Essential (primary) hypertension: Secondary | ICD-10-CM | POA: Diagnosis not present

## 2022-10-14 DIAGNOSIS — R300 Dysuria: Secondary | ICD-10-CM | POA: Diagnosis not present

## 2022-10-14 DIAGNOSIS — R5383 Other fatigue: Secondary | ICD-10-CM | POA: Diagnosis not present

## 2022-10-14 DIAGNOSIS — E7800 Pure hypercholesterolemia, unspecified: Secondary | ICD-10-CM | POA: Diagnosis not present

## 2022-10-14 DIAGNOSIS — Z7982 Long term (current) use of aspirin: Secondary | ICD-10-CM | POA: Diagnosis not present

## 2022-10-20 ENCOUNTER — Telehealth: Payer: Self-pay | Admitting: Pharmacy Technician

## 2022-10-20 DIAGNOSIS — Z596 Low income: Secondary | ICD-10-CM

## 2022-10-20 NOTE — Progress Notes (Signed)
[place] [facility])                                            [place] Team    10/20/2022  Dominique Dillon 10-28-1955 [id number]  Received both patient and provider portion(s) of patient assistance application(s) for [place] and Novolog. Faxed completed application and required documents into [employer].   Dominique Dillon Dominique Dillon, [place]  [phone]

## 2022-10-21 DIAGNOSIS — Z0000 Encounter for general adult medical examination without abnormal findings: Secondary | ICD-10-CM | POA: Diagnosis not present

## 2022-10-21 DIAGNOSIS — E785 Hyperlipidemia, unspecified: Secondary | ICD-10-CM | POA: Diagnosis not present

## 2022-10-21 DIAGNOSIS — G622 Polyneuropathy due to other toxic agents: Secondary | ICD-10-CM | POA: Diagnosis not present

## 2022-10-21 DIAGNOSIS — F325 Major depressive disorder, single episode, in full remission: Secondary | ICD-10-CM | POA: Diagnosis not present

## 2022-10-21 DIAGNOSIS — I10 Essential (primary) hypertension: Secondary | ICD-10-CM | POA: Diagnosis not present

## 2022-10-21 DIAGNOSIS — E7800 Pure hypercholesterolemia, unspecified: Secondary | ICD-10-CM | POA: Diagnosis not present

## 2022-10-21 DIAGNOSIS — K219 Gastro-esophageal reflux disease without esophagitis: Secondary | ICD-10-CM | POA: Diagnosis not present

## 2022-10-21 DIAGNOSIS — F329 Major depressive disorder, single episode, unspecified: Secondary | ICD-10-CM | POA: Diagnosis not present

## 2022-10-21 DIAGNOSIS — H9313 Tinnitus, bilateral: Secondary | ICD-10-CM | POA: Diagnosis not present

## 2022-10-21 DIAGNOSIS — Z1159 Encounter for screening for other viral diseases: Secondary | ICD-10-CM | POA: Diagnosis not present

## 2022-10-21 DIAGNOSIS — E1165 Type 2 diabetes mellitus with hyperglycemia: Secondary | ICD-10-CM | POA: Diagnosis not present

## 2022-10-21 DIAGNOSIS — Z79899 Other long term (current) drug therapy: Secondary | ICD-10-CM | POA: Diagnosis not present

## 2022-10-21 DIAGNOSIS — C50912 Malignant neoplasm of unspecified site of left female breast: Secondary | ICD-10-CM | POA: Diagnosis not present

## 2022-10-21 DIAGNOSIS — Z136 Encounter for screening for cardiovascular disorders: Secondary | ICD-10-CM | POA: Diagnosis not present

## 2022-10-21 DIAGNOSIS — N390 Urinary tract infection, site not specified: Secondary | ICD-10-CM | POA: Diagnosis not present

## 2022-10-25 ENCOUNTER — Telehealth: Payer: Self-pay | Admitting: Pharmacy Technician

## 2022-10-25 DIAGNOSIS — Z596 Low income: Secondary | ICD-10-CM

## 2022-10-25 NOTE — Progress Notes (Signed)
[facility])                                            [place] Team    10/25/2022  Dominique Dillon [birth year] [id number]  Care coordination call placed to [employer] in regard to [name] and Novolog application.   Spoke to [place] who informs patient is APPROVED 10/1422-08/09/23. Medications will automatically fill and ship to provider's address on file initially and going forward in 2024. Patient may call [place] at [phone] if shipment has not arrived and patient does not have sufficient supply.  Airi Copado P. Magdaline Zollars, [place]  [phone]

## 2022-10-28 DIAGNOSIS — H9313 Tinnitus, bilateral: Secondary | ICD-10-CM | POA: Diagnosis not present

## 2022-10-28 DIAGNOSIS — L309 Dermatitis, unspecified: Secondary | ICD-10-CM | POA: Diagnosis not present

## 2022-11-18 DIAGNOSIS — Z794 Long term (current) use of insulin: Secondary | ICD-10-CM | POA: Diagnosis not present

## 2022-11-18 DIAGNOSIS — H9312 Tinnitus, left ear: Secondary | ICD-10-CM | POA: Diagnosis not present

## 2022-11-18 DIAGNOSIS — I10 Essential (primary) hypertension: Secondary | ICD-10-CM | POA: Diagnosis not present

## 2022-11-18 DIAGNOSIS — E7800 Pure hypercholesterolemia, unspecified: Secondary | ICD-10-CM | POA: Diagnosis not present

## 2022-11-18 DIAGNOSIS — E1165 Type 2 diabetes mellitus with hyperglycemia: Secondary | ICD-10-CM | POA: Diagnosis not present

## 2022-11-23 DIAGNOSIS — R0981 Nasal congestion: Secondary | ICD-10-CM | POA: Diagnosis not present

## 2022-11-23 DIAGNOSIS — H9313 Tinnitus, bilateral: Secondary | ICD-10-CM | POA: Diagnosis not present

## 2022-11-23 DIAGNOSIS — M792 Neuralgia and neuritis, unspecified: Secondary | ICD-10-CM | POA: Diagnosis not present

## 2022-11-23 DIAGNOSIS — H903 Sensorineural hearing loss, bilateral: Secondary | ICD-10-CM | POA: Diagnosis not present

## 2022-11-30 DIAGNOSIS — Z0001 Encounter for general adult medical examination with abnormal findings: Secondary | ICD-10-CM | POA: Diagnosis not present

## 2022-11-30 DIAGNOSIS — Z79899 Other long term (current) drug therapy: Secondary | ICD-10-CM | POA: Diagnosis not present

## 2022-11-30 DIAGNOSIS — D509 Iron deficiency anemia, unspecified: Secondary | ICD-10-CM | POA: Diagnosis not present

## 2022-11-30 DIAGNOSIS — Z1211 Encounter for screening for malignant neoplasm of colon: Secondary | ICD-10-CM | POA: Diagnosis not present

## 2022-11-30 DIAGNOSIS — E1165 Type 2 diabetes mellitus with hyperglycemia: Secondary | ICD-10-CM | POA: Diagnosis not present

## 2022-11-30 DIAGNOSIS — E785 Hyperlipidemia, unspecified: Secondary | ICD-10-CM | POA: Diagnosis not present

## 2022-11-30 DIAGNOSIS — Z853 Personal history of malignant neoplasm of breast: Secondary | ICD-10-CM | POA: Diagnosis not present

## 2022-11-30 DIAGNOSIS — Z0000 Encounter for general adult medical examination without abnormal findings: Secondary | ICD-10-CM | POA: Diagnosis not present

## 2022-11-30 DIAGNOSIS — F320 Major depressive disorder, single episode, mild: Secondary | ICD-10-CM | POA: Diagnosis not present

## 2022-12-07 ENCOUNTER — Encounter: Payer: Self-pay | Admitting: Neurology

## 2022-12-07 ENCOUNTER — Telehealth: Payer: Self-pay | Admitting: Neurology

## 2022-12-07 ENCOUNTER — Ambulatory Visit: Payer: Medicare PPO | Admitting: Neurology

## 2022-12-07 VITALS — BP 115/70 | HR 85 | Ht 63.0 in | Wt 186.8 lb

## 2022-12-07 DIAGNOSIS — G902 Horner's syndrome: Secondary | ICD-10-CM

## 2022-12-07 DIAGNOSIS — H9202 Otalgia, left ear: Secondary | ICD-10-CM | POA: Diagnosis not present

## 2022-12-07 DIAGNOSIS — R519 Headache, unspecified: Secondary | ICD-10-CM

## 2022-12-07 DIAGNOSIS — I9752 Accidental puncture and laceration of a circulatory system organ or structure during other procedure: Secondary | ICD-10-CM

## 2022-12-07 DIAGNOSIS — I7771 Dissection of carotid artery: Secondary | ICD-10-CM | POA: Diagnosis not present

## 2022-12-07 DIAGNOSIS — H9192 Unspecified hearing loss, left ear: Secondary | ICD-10-CM | POA: Insufficient documentation

## 2022-12-07 DIAGNOSIS — Z853 Personal history of malignant neoplasm of breast: Secondary | ICD-10-CM | POA: Diagnosis not present

## 2022-12-07 DIAGNOSIS — H93A2 Pulsatile tinnitus, left ear: Secondary | ICD-10-CM

## 2022-12-07 DIAGNOSIS — S065XAA Traumatic subdural hemorrhage with loss of consciousness status unknown, initial encounter: Secondary | ICD-10-CM

## 2022-12-07 DIAGNOSIS — H5460 Unqualified visual loss, one eye, unspecified: Secondary | ICD-10-CM

## 2022-12-07 NOTE — Patient Instructions (Addendum)
MRI brain w/wo contrast MRA(Arteries) of the head If negative sounds like eustacian tube dysfunction, she would like to see an allergist and I can refer or Dominique Dillon

## 2022-12-07 NOTE — Telephone Encounter (Signed)
Ethlyn Gallery: [id number] exp. 12/07/22-01/06/23 sent to GI [phone]

## 2022-12-07 NOTE — Progress Notes (Signed)
[facility]    Provider:  Dr Lucia Gaskins Requesting Provider: Fatima Sanger, FNP Primary Care Provider:  Fatima Sanger, FNP  CC:  Left ear acute hearing changes and pain  HPI:  Dominique Dillon is a 67 y.o. female here as requested by Fatima Sanger, FNP for tinnitus.Pmhx includes diabetes, HLD, HTN, malignant breast cancer, depression.  Patient here alone. She reports in December the 18th she had surgery on her shoulder. In 2016 she had breast cancer. norse started an IV in the right lower arm, she had a nerve block for anesthesia for the shoulder surgery. When she got home she was totally "out of it" she was very hyped up. She has aspirational pneumonia.That's when she started hearing left noises after the nerve block, Dominique Dillon did the surgery at [facility], After the surgery labile blood pressure and labile, blood glucose, she has been to pcp and multiple ENTs [facility]: Domin[facility] Dillon [facility]NT. Every day the nise gets louder like fluid behind the ear. Not a ringing. She does not think this is her ears she thinks there is something going on in her head. At night she can feel things running down her neck. Also some vision changes in the left eye and wakes up with soreness left temple and left head. But the real issue is the noise, feeling like she is under water, can be pulsatile in quality, thought she was having a stroke and aneurysm. TMs pearly, no pain on palpation of ears or TMJ . No lesions, no rashes. She has neck pain in the area of the cutaneous block. No other focal neurologic deficits, associated symptoms, inciting events or modifiable factors.   Reviewed notes, labs and imaging from outside physicians, which showed:  I reviewed Dominique Dillon's notes: She has been to ENT at [facility] and [place] and told the problem is hearing loss. Patient asked for neuro. Unfortunately tinnitus is almost always due to hearing loss. I spoke with an audiologist the other day for  another patient who told me 85% tinnitus is hearing loss and 85% of those people will get better with hearing aids. Happy to image brain to ensure no other neurologic cause which would be rare such as head injuries, tumor-related disorders, blood vessel problems.   10/14/2022 TSH nml, hgba1c 04-08-2022 hgba1c 7.6 but patient reports latest 8.7   Review of Systems: Patient complains of symptoms per HPI as well as the following symptoms left ear pain. Pertinent negatives and positives per HPI. All others negative.   Social History   Socioeconomic History   Marital status: Widowed    Spouse name: Not on file   Number of children: 2   Years of education: 1  yr of college   Highest education level: Not on file  Occupational History   Not on file  Tobacco Use   Smoking status: Never   Smokeless tobacco: Never  Vaping Use   Vaping Use: Never used  Substance and Sexual Activity   Alcohol use: No   Drug use: No   Sexual activity: Not on file  Other Topics Concern   Not on file  Social History Narrative   Lives at home alone   Right handed   Caffeine: coffee 1 cup/day   Social Determinants of Health   Financial Resource Strain: Not on file  Food Insecurity: Not on file  Transportation Needs: Not on file  Physical Activity: Not on file  Stress: Not on file  Social Connections: Not on  file  Intimate Partner Violence: Not on file    Family History  Problem Relation Age of Onset   Diabetes Mother    Prostate cancer Father    Stomach cancer Maternal Aunt    Pancreatic cancer Maternal Uncle    Lung cancer Maternal Grandmother    Breast cancer Maternal Aunt    Pancreatic cancer Maternal Aunt    Pancreatic cancer Maternal Uncle    Heart attack Maternal Grandfather     Past Medical History:  Diagnosis Date   Allergic rhinitis    Arthritis    knees   Breast cancer of upper-outer quadrant of left female breast (HCC) 09/12/2014   DDD (degenerative disc disease), cervical     Depression    GERD (gastroesophageal reflux disease)    History of anemia    still takes iron supplement   Hypertension    under control with med., has been on med. since 1990s   Insulin dependent diabetes mellitus    Obesity    Personal history of chemotherapy    Personal history of radiation therapy    Seasonal asthma    no current med.    Patient Active Problem List   Diagnosis Date Noted   Acute hearing loss, left 12/07/2022   Left temporal headache 12/07/2022   Ear pain, left 12/07/2022   Dyslipidemia, goal LDL below 100 08/29/2015   Metabolic syndrome 08/29/2015   Chest pain with moderate risk for cardiac etiology 05/28/2015   Right carotid bruit 05/28/2015   Intermittent claudication (HCC) 05/28/2015   Hypoxia 05/20/2015   Hypotension 05/20/2015   Chest pain 05/20/2015   Hypokalemia 05/20/2015   Prolonged QT interval 05/20/2015   Syncope 05/20/2015   Chest pain at rest 05/20/2015   Obesity, Class II, BMI 35-39.9    Medication adverse effect    Thalassemia 03/05/2015   Seasonal allergies 02/08/2015   Cancer associated pain 02/08/2015   Glaucoma 12/31/2014   Breast cancer of upper-outer quadrant of left female breast (HCC) 09/12/2014   Abscess of abdominal wall s/p I&D 7/30- & 03/08/2014 03/08/2014   Cellulitis 03/08/2014   Diabetes mellitus type 2, controlled (HCC) 03/08/2014   Essential hypertension, benign 03/08/2014   GERD (gastroesophageal reflux disease) 03/08/2014   Anemia in neoplastic disease 03/08/2014    Past Surgical History:  Procedure Laterality Date   ABDOMINAL HYSTERECTOMY  1995   complete   APPENDECTOMY  1980   BREAST BIOPSY Left 10/15/2015   BREAST BIOPSY Left 09/26/2015   BREAST BIOPSY Left 09/09/2014   BREAST LUMPECTOMY Left 10/01/2014   CARPAL TUNNEL RELEASE Right 08/06/2004   CESAREAN SECTION     x 2   CHOLECYSTECTOMY  1980   COLONOSCOPY     exploratory surgery of abdomen  11/1978   IRRIGATION AND DEBRIDEMENT ABSCESS N/A 03/08/2014    Procedure: IRRIGATION AND DEBRIDEMENT ABDOMINAL WALL ABSCESS;  Surgeon: Ardeth Sportsman, MD;  Location: [facility];  Service: General;  Laterality: N/A;   KNEE ARTHROSCOPY Bilateral    NM MYOVIEW LTD  06/2015   LOW RISK. HTN response to exercise. No EKG changes. Small, partially reversible apical defect (probably Breast Attenuation, but CRO apical Ischemia).  EF 63% with no RWMA   polyp removed from L breast     PORTACATH PLACEMENT Right 10/21/2014   Procedure: INSERTION PORT-A-CATH;  Surgeon: Emelia Loron, MD;  Location: [facility];  Service: General;  Laterality: Right;   RADIOACTIVE SEED GUIDED PARTIAL MASTECTOMY WITH AXILLARY SENTINEL LYMPH NODE BIOPSY Left 10/01/2014  Procedure: RADIOACTIVE SEED GUIDED LEFT BREAST LUMPECTOMY WITH LEFT  AXILLARY SENTINEL LYMPH NODE BIOPSY;  Surgeon: Emelia Loron, MD;  Location: [facility];  Service: General;  Laterality: Left;   RE-EXCISION OF BREAST LUMPECTOMY Left 10/21/2014   Procedure: RE-EXCISION OF BREAST CANCER, POSTERIOR MARGINS;  Surgeon: Emelia Loron, MD;  Location:  SURGERY CENTER;  Service: General;  Laterality: Left;   TONSILLECTOMY  1980   TRIGGER FINGER RELEASE Right 08/06/2004   middle finger   UPPER GI ENDOSCOPY      Current Outpatient Medications  Medication Sig Dispense Refill   Biotin 5000 MCG CAPS Take 1 capsule by mouth daily.     cetirizine (ZYRTEC) 10 MG tablet Take 10 mg by mouth daily.     chlorpheniramine-HYDROcodone (TUSSIONEX PENNKINETIC ER) 10-8 MG/5ML SUER Take 5 mLs by mouth every 12 (twelve) hours as needed for cough. 50 mL 0   cholecalciferol (VITAMIN D3) 25 MCG (1000 UNIT) tablet Take 1,000 Units by mouth daily.     CINNAMON PO Take 4,000 mg by mouth daily.     cyanocobalamin (VITAMIN B12) 1000 MCG tablet Take 1,000 mcg by mouth daily.     DULoxetine (CYMBALTA) 60 MG capsule Take 60 mg by mouth daily.     folic acid (FOLVITE) 400 MCG tablet Take 400 mcg by mouth  daily.     GNP GARLIC EXTRACT PO Take 1,000 mg by mouth daily.     insulin aspart (NOVOLOG FLEXPEN) 100 UNIT/ML FlexPen Inject 12 Units into the skin with breakfast, with lunch, and with evening meal. Before carb meals.     insulin degludec (TRESIBA FLEXTOUCH) 100 UNIT/ML FlexTouch Pen Inject 30 Units into the skin daily (Increase as directed. Max daily dose 50 units daily) 15 mL 3   losartan (COZAAR) 25 MG tablet Take 1 tablet (25 mg total) by mouth daily. PLEASE SCHEDULE OFFICE VISIT FOR REFILLS OR GET FROM PCP 15 tablet 0   lovastatin (MEVACOR) 40 MG tablet Take 40 mg by mouth daily.     Multiple Vitamins-Minerals (CENTRUM WOMEN PO) Take 1 each by mouth daily.     Omega-3 Fatty Acids (FISH OIL PO) Take 3 each by mouth daily.      pantoprazole (PROTONIX) 40 MG tablet Take 40 mg by mouth daily.     No current facility-administered medications for this visit.    Allergies as of 12/07/2022 - Review Complete 12/07/2022  Allergen Reaction Noted   Crestor [rosuvastatin]  11/19/2019   Humalog [insulin lispro]  11/19/2019   Lipitor [atorvastatin]  11/19/2019   Macrobid [nitrofurantoin monohyd macro] Hives and Itching 05/20/2015   Biaxin [clarithromycin] Other (See Comments) 06/20/2012    Vitals: BP 115/70   Pulse 85   Ht 5\' 3"  (1.6 m)   Wt 186 lb 12.8 oz (84.7 kg)   BMI 33.09 kg/m  Last Weight:  Wt Readings from Last 1 Encounters:  12/07/22 186 lb 12.8 oz (84.7 kg)   Last Height:   Ht Readings from Last 1 Encounters:  12/07/22 5\' 3"  (1.6 m)     Physical exam: Exam: Gen: NAD, conversant, well nourised, obese, well groomed                     CV: RRR, no MRG. No Carotid Bruits. No peripheral edema, warm, nontender Eyes: Conjunctivae clear without exudates or hemorrhage Ears: TMs pearly, + pain on palpation of ears and when rotating neck  Neuro: Detailed Neurologic Exam  Speech:    Speech  is normal; fluent and spontaneous with normal comprehension.  Cognition:    The  patient is oriented to person, place, and time;     recent and remote memory intact;     language fluent;     normal attention, concentration,     fund of knowledge Cranial Nerves:    The pupils are equal, round, and reactive to light. Attempted fundoscopy, pupils too small. Visual fields are full to finger confrontation. Extraocular movements are intact. Trigeminal sensation is intact and the muscles of mastication are normal. The face is symmetric. The palate elevates in the midline. Hearing decreased left ear. Voice is normal. Shoulder shrug is normal. The tongue has normal motion without fasciculations.   Coordination: No dysmetria  Gait: Antalgic slightly  Motor Observation:    No asymmetry, no atrophy, and no involuntary movements noted. Tone:    Normal muscle tone.    Posture:    Posture is normal. normal erect    Strength:    Strength is V/V in the upper and lower limbs.      Sensation: intact to LT     Reflex Exam:  DTR's:    Deep tendon reflexes in the upper and lower extremities are symmetrical bilaterally.   Toes:    The toes are downgoing bilaterally.   Clonus:    Clonus is absent.    Assessment/Plan:  Jordyn Hofacker is a 67 y.o. female here as requested by Fatima Sanger, FNP for tinnitus. She has been to ENT at [facility] and [place] . Patient asked for neuro evaluation. Symptoms occurred after a local shoulder block on the left with now ear pain, pulsatile tinnitus, swishing noises.  I do not have [facility]'s notes but sounds like interscalene nerve block. Common complications of the interscalene nerve block include Horner's syndrome, vasculature puncture, carotid artery puncture and intervertebral artery injection, subdural injection, intervertebral foramina injection resulting in spinal or epidural anesthesia, and nerve injury.  Unfortunately tinnitus is almost always due to hearing loss. I spoke with an audiologist the other day for another patient who told me  85% tinnitus is hearing loss and 85% of those people will get better with hearing aids. However due to the above rare complications and time course of left ear problems right after procedure happy to image brain and arteries to ensure no other neurologic cause as above.   Orders Placed This Encounter  Procedures   MR BRAIN Dillon WO CONTRAST   MR ANGIO HEAD WO CONTRAST   MR ANGIO HEAD WO Dillon CONTRAST   Basic Metabolic Panel   Sedimentation rate   C-reactive protein     Cc: Prevost, Dominique Lambert, FNP,  Fatima Sanger, FNP  Naomie Dean, MD  [facility] [address] [place], [place] [address]  Phone [phone] Fax [phone]  I spent over 60 minutes of face-to-face and non-face-to-face time with patient on the  1. Pulsatile tinnitus of left ear   2. Acute hearing loss, left   3. Monocular vision loss   4. Left temporal headache   5. HX: breast cancer   6. Ear pain, left   7. Horner syndrome   8. Accidental puncture or laceration during a procedure on a blood vessel   9. SDH (subdural hematoma) (HCC)   10. Dissection of carotid artery (HCC)    diagnosis.  This included previsit chart review, lab review, study review, order entry, electronic health record documentation, patient education on the different diagnostic and therapeutic options,  counseling and coordination of care, risks and benefits of management, compliance, or risk factor reduction

## 2022-12-08 LAB — BASIC METABOLIC PANEL
BUN/Creatinine Ratio: 17 (ref 12–28)
BUN: 12 mg/dL (ref 8–27)
CO2: 21 mmol/L (ref 20–29)
Calcium: 9.1 mg/dL (ref 8.7–10.3)
Chloride: 104 mmol/L (ref 96–106)
Creatinine, Ser: 0.72 mg/dL (ref 0.57–1.00)
Glucose: 195 mg/dL — ABNORMAL HIGH (ref 70–99)
Potassium: 3.9 mmol/L (ref 3.5–5.2)
Sodium: 139 mmol/L (ref 134–144)
eGFR: 91 mL/min/{1.73_m2} (ref >59)

## 2022-12-08 LAB — C-REACTIVE PROTEIN: CRP: 22 mg/L — ABNORMAL HIGH (ref 0–10)

## 2022-12-08 LAB — SEDIMENTATION RATE: Sed Rate: 24 mm/hr (ref 0–40)

## 2022-12-09 ENCOUNTER — Telehealth: Payer: Self-pay | Admitting: *Deleted

## 2022-12-09 ENCOUNTER — Encounter: Payer: Self-pay | Admitting: Neurology

## 2022-12-09 ENCOUNTER — Other Ambulatory Visit: Payer: Self-pay | Admitting: Neurology

## 2022-12-09 ENCOUNTER — Telehealth: Payer: Self-pay | Admitting: Neurology

## 2022-12-09 DIAGNOSIS — R7982 Elevated C-reactive protein (CRP): Secondary | ICD-10-CM

## 2022-12-09 DIAGNOSIS — R7309 Other abnormal glucose: Secondary | ICD-10-CM

## 2022-12-09 DIAGNOSIS — E1149 Type 2 diabetes mellitus with other diabetic neurological complication: Secondary | ICD-10-CM

## 2022-12-09 NOTE — Telephone Encounter (Signed)
Please have her tell the lab tech to tell you all when she is here so I can stop by and ask her a few questions. thanks

## 2022-12-09 NOTE — Telephone Encounter (Signed)
I called and spoke to patient she does not have vision changes that are consistent or frequent in one eye, 1x a week may have some left temple pain, no jaw pain or pain chewing, no fevers,   We discussed temoral arteritis, low pro she has it but will recheck esr/crp Monday (crp 22) BUT if she should start having consistent temple pain, vision changes, headache unilateral.

## 2022-12-09 NOTE — Telephone Encounter (Signed)
Spoke to patient gave lab work results  informed patient Dr Lucia Gaskins wanted her to come back next week and get her CRP redrawn pt states will come Monday Pt states was not taking any steroids when lab work was done . Will make Dr Lucia Gaskins aware Pt expressed understanding and thanked me for calling

## 2022-12-09 NOTE — Telephone Encounter (Signed)
-----   Message from Anson Fret, MD sent at 12/09/2022  1:30 PM EDT ----- Pod 4: Patient's sed rate was normal but her crp came back a little elevated. I'd like her to come back and repeat the testing next week can you discuss with her? Want to make sure she does not have temporal arteritis but she was on steroids when we took thee tests.

## 2022-12-13 ENCOUNTER — Other Ambulatory Visit (INDEPENDENT_AMBULATORY_CARE_PROVIDER_SITE_OTHER): Payer: Self-pay

## 2022-12-13 DIAGNOSIS — Z0289 Encounter for other administrative examinations: Secondary | ICD-10-CM

## 2022-12-13 DIAGNOSIS — R7982 Elevated C-reactive protein (CRP): Secondary | ICD-10-CM

## 2022-12-13 DIAGNOSIS — E1149 Type 2 diabetes mellitus with other diabetic neurological complication: Secondary | ICD-10-CM

## 2022-12-13 DIAGNOSIS — R7309 Other abnormal glucose: Secondary | ICD-10-CM

## 2022-12-14 LAB — HEMOGLOBIN A1C
Est. average glucose Bld gHb Est-mCnc: 197 mg/dL
Hgb A1c MFr Bld: 8.5 % — ABNORMAL HIGH (ref 4.8–5.6)

## 2022-12-14 LAB — SEDIMENTATION RATE: Sed Rate: 21 mm/h (ref 0–40)

## 2022-12-14 LAB — C-REACTIVE PROTEIN: CRP: 3 mg/L (ref 0–10)

## 2022-12-22 ENCOUNTER — Encounter: Payer: Self-pay | Admitting: Neurology

## 2022-12-30 ENCOUNTER — Other Ambulatory Visit (HOSPITAL_BASED_OUTPATIENT_CLINIC_OR_DEPARTMENT_OTHER): Payer: Self-pay

## 2022-12-30 MED ORDER — MOUNJARO 5 MG/0.5ML ~~LOC~~ SOAJ
SUBCUTANEOUS | 0 refills | Status: DC
  Filled 2022-12-30: qty 2, 28d supply, fill #0

## 2022-12-30 MED ORDER — MOUNJARO 2.5 MG/0.5ML ~~LOC~~ SOAJ
SUBCUTANEOUS | 0 refills | Status: DC
  Filled 2022-12-30: qty 2, 28d supply, fill #0

## 2022-12-31 ENCOUNTER — Other Ambulatory Visit (HOSPITAL_BASED_OUTPATIENT_CLINIC_OR_DEPARTMENT_OTHER): Payer: Self-pay

## 2023-01-04 ENCOUNTER — Other Ambulatory Visit (HOSPITAL_BASED_OUTPATIENT_CLINIC_OR_DEPARTMENT_OTHER): Payer: Self-pay

## 2023-01-06 ENCOUNTER — Ambulatory Visit
Admission: RE | Admit: 2023-01-06 | Discharge: 2023-01-06 | Disposition: A | Discharge status: Home / Self Care | Payer: Medicare PPO | Source: Ambulatory Visit | Attending: Neurology | Admitting: Neurology

## 2023-01-06 DIAGNOSIS — Z853 Personal history of malignant neoplasm of breast: Secondary | ICD-10-CM

## 2023-01-06 DIAGNOSIS — I9752 Accidental puncture and laceration of a circulatory system organ or structure during other procedure: Secondary | ICD-10-CM

## 2023-01-06 DIAGNOSIS — H9192 Unspecified hearing loss, left ear: Secondary | ICD-10-CM

## 2023-01-06 DIAGNOSIS — G902 Horner's syndrome: Secondary | ICD-10-CM

## 2023-01-06 DIAGNOSIS — H9202 Otalgia, left ear: Secondary | ICD-10-CM

## 2023-01-06 DIAGNOSIS — I7771 Dissection of carotid artery: Secondary | ICD-10-CM

## 2023-01-06 DIAGNOSIS — R519 Headache, unspecified: Secondary | ICD-10-CM

## 2023-01-06 DIAGNOSIS — H5460 Unqualified visual loss, one eye, unspecified: Secondary | ICD-10-CM

## 2023-01-06 DIAGNOSIS — H93A2 Pulsatile tinnitus, left ear: Secondary | ICD-10-CM

## 2023-01-06 DIAGNOSIS — S065XAA Traumatic subdural hemorrhage with loss of consciousness status unknown, initial encounter: Secondary | ICD-10-CM

## 2023-01-06 MED ORDER — GADOPICLENOL 0.5 MMOL/ML IV SOLN
7.5000 mL | Freq: Once | INTRAVENOUS | Status: AC | PRN
  Administered 2023-01-06: 7.5 mL via INTRAVENOUS

## 2023-01-10 ENCOUNTER — Encounter: Payer: Self-pay | Admitting: Neurology

## 2023-01-13 ENCOUNTER — Encounter: Payer: Self-pay | Admitting: Neurology

## 2023-01-15 IMAGING — MG MM DIGITAL SCREENING BILAT W/ TOMO AND CAD
6 of 12 series · 6 of 36 positions shown · non-contrast
Comparison: Previous exam(s).

CLINICAL DATA: Screening.

EXAM:
DIGITAL SCREENING BILATERAL MAMMOGRAM WITH TOMOSYNTHESIS AND CAD
TECHNIQUE: Bilateral screening digital craniocaudal and mediolateral oblique
mammograms were obtained. Bilateral screening digital breast
tomosynthesis was performed. The images were evaluated with
computer-aided detection.

[R CC synth-2D (1 of 2)]
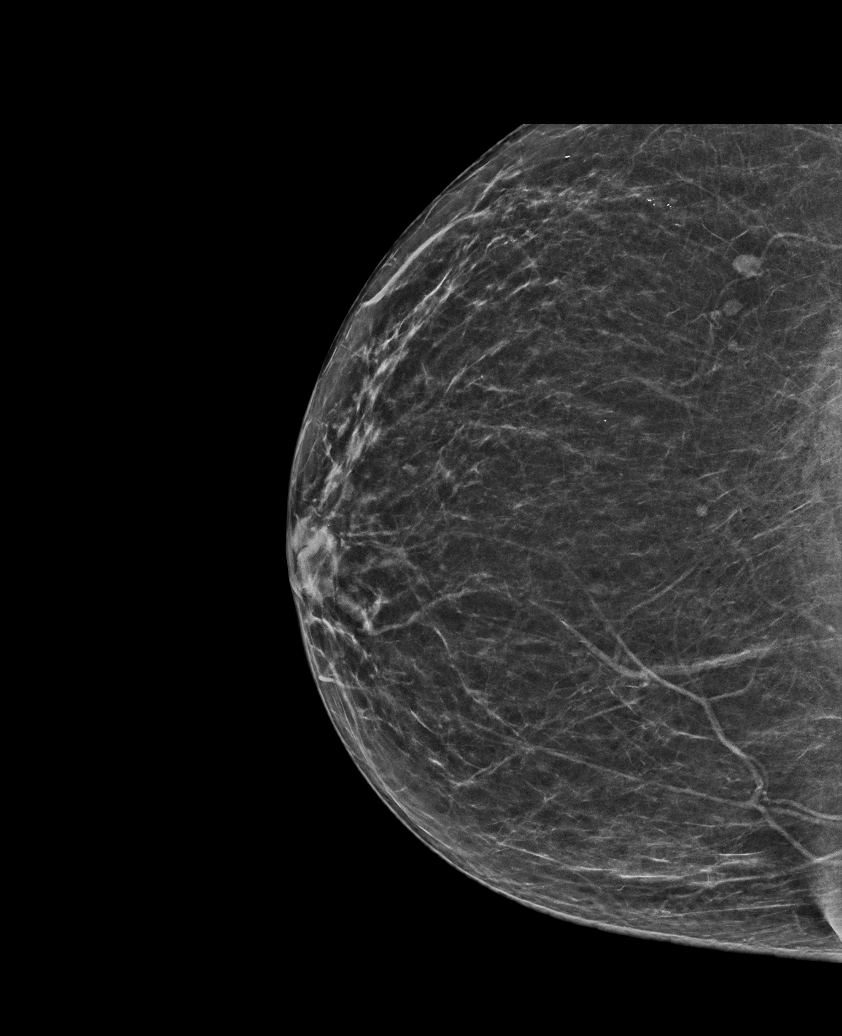

[R CC synth-2D (2 of 2)]
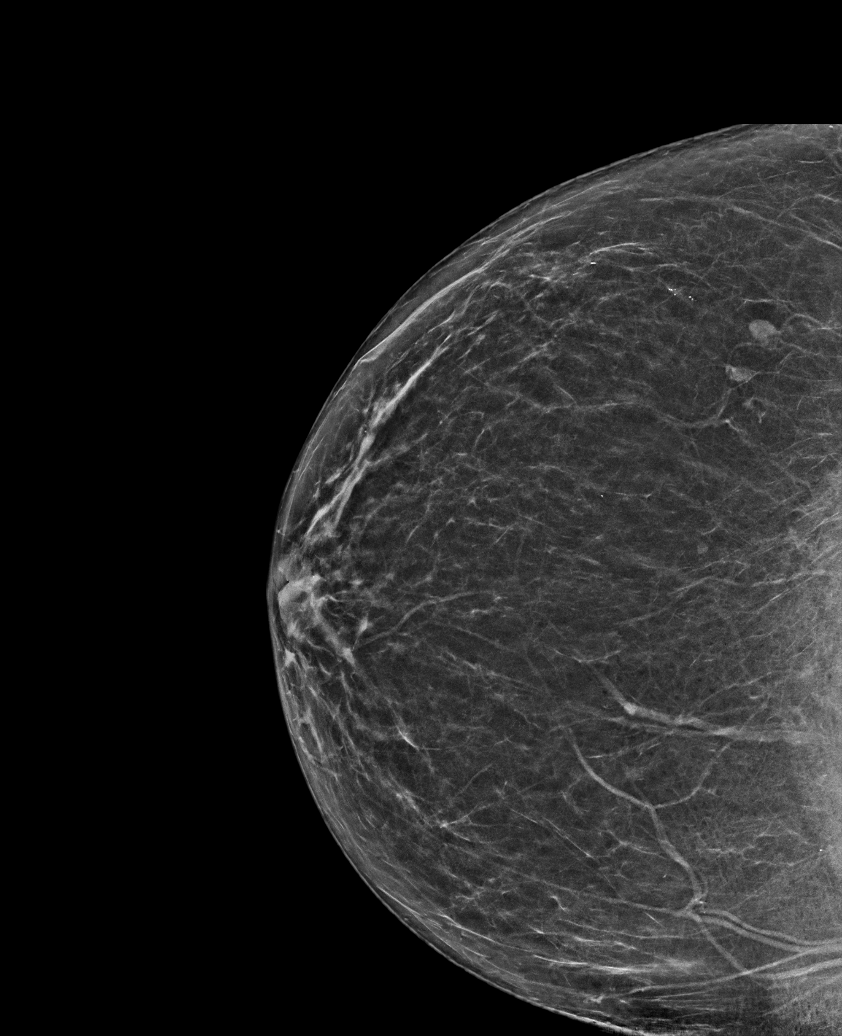

[L CC synth-2D (1 of 2)]
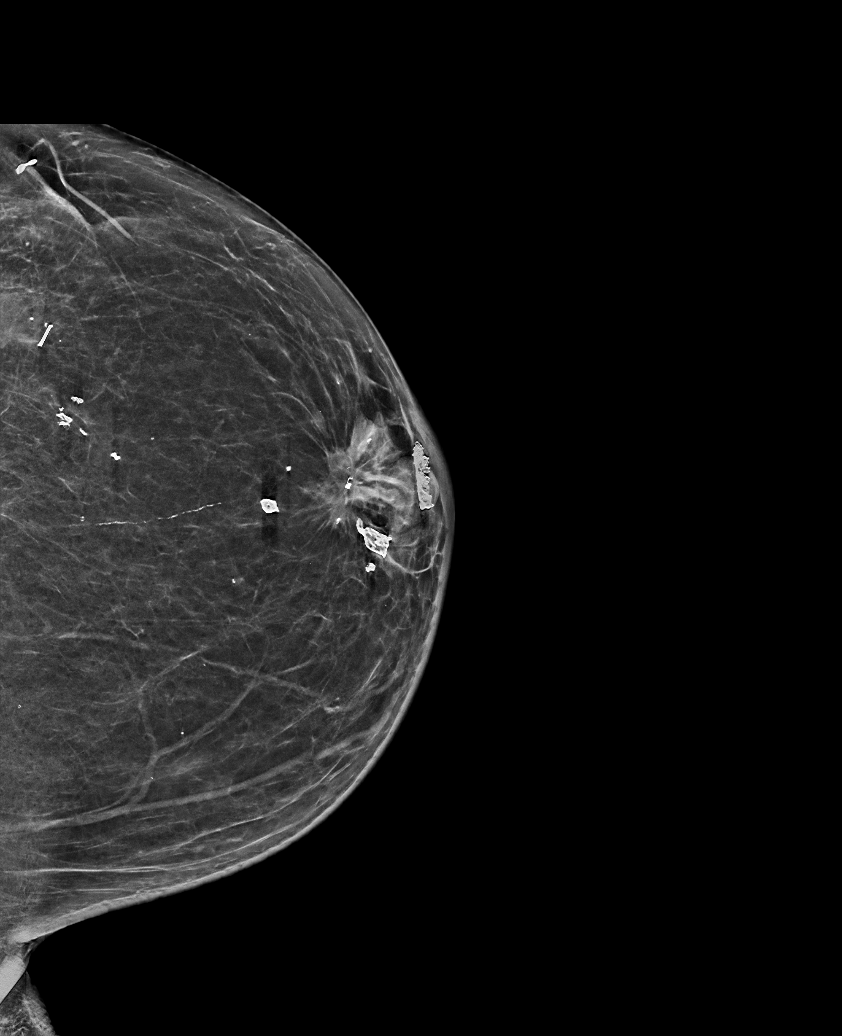

[L CC synth-2D (2 of 2)]
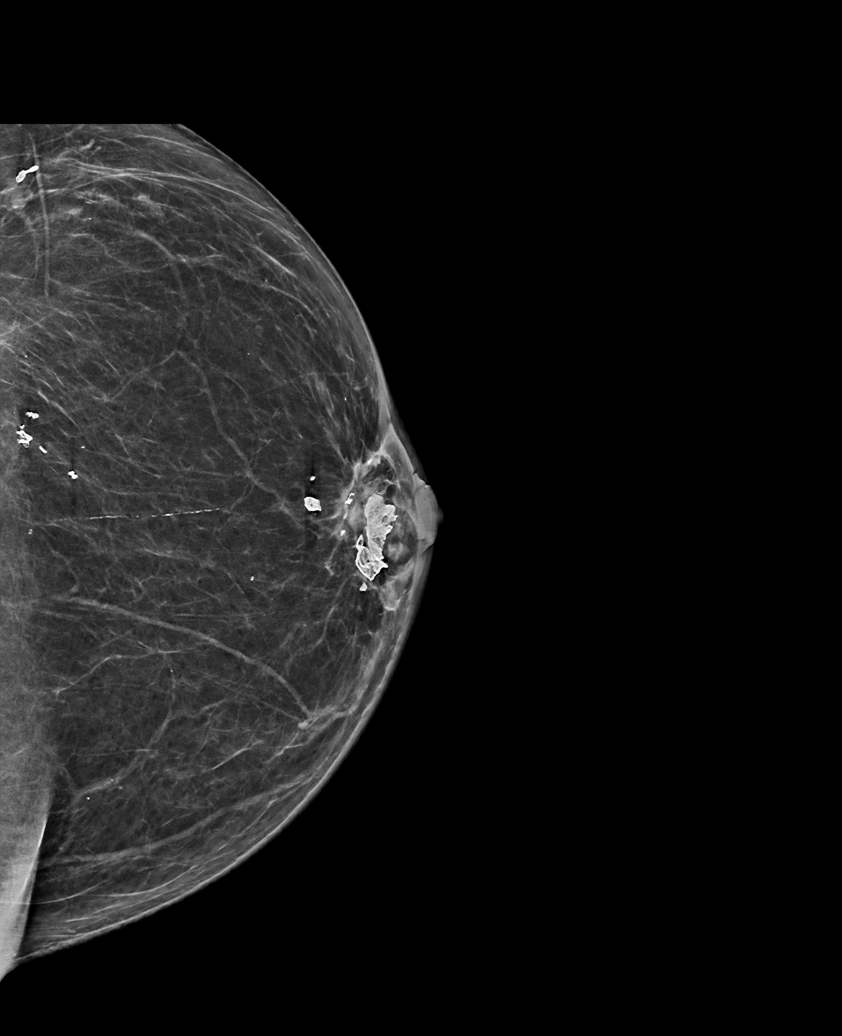

[R MLO synth-2D]
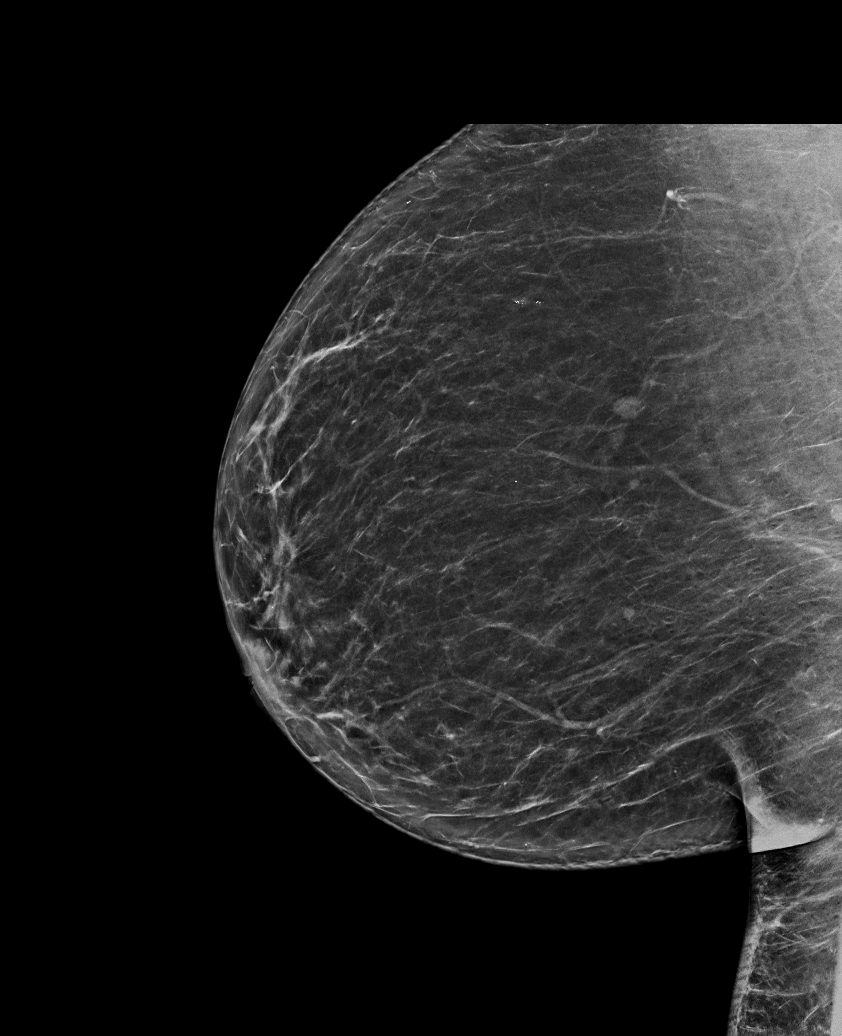

[L MLO synth-2D]
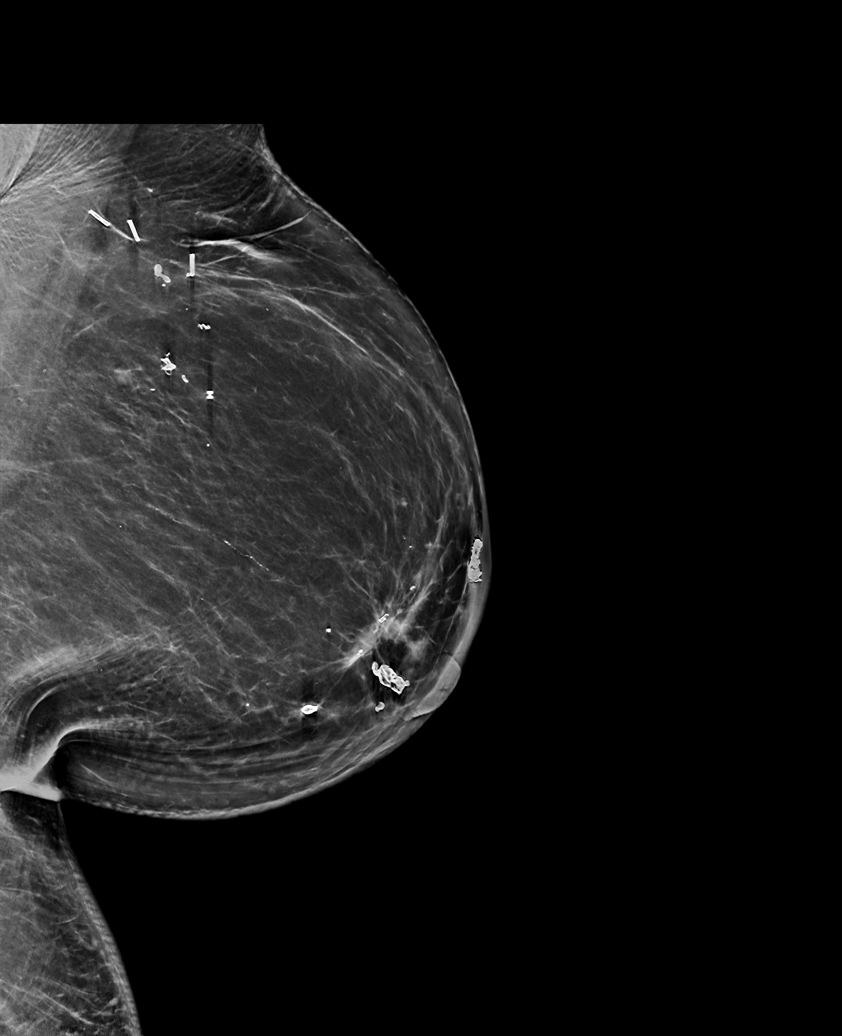

[6 of 36 positions shown; findings below may reference images not displayed]

ACR Breast Density Category b: There are scattered areas of
fibroglandular density.
FINDINGS: There are no findings suspicious for malignancy.
IMPRESSION: No mammographic evidence of malignancy. A result letter of this
screening mammogram will be mailed directly to the patient.

RECOMMENDATION:
Screening mammogram in one year. (Code:51-O-LD2)

BI-RADS CATEGORY  1: Negative.

## 2023-01-17 ENCOUNTER — Other Ambulatory Visit: Payer: Self-pay | Admitting: Neurology

## 2023-01-17 DIAGNOSIS — T7840XA Allergy, unspecified, initial encounter: Secondary | ICD-10-CM

## 2023-01-17 DIAGNOSIS — H6993 Unspecified Eustachian tube disorder, bilateral: Secondary | ICD-10-CM

## 2023-01-24 ENCOUNTER — Ambulatory Visit: Payer: Medicare PPO | Admitting: Neurology

## 2023-02-01 ENCOUNTER — Other Ambulatory Visit: Payer: Self-pay | Admitting: Registered Nurse

## 2023-02-01 DIAGNOSIS — Z1231 Encounter for screening mammogram for malignant neoplasm of breast: Secondary | ICD-10-CM

## 2023-02-03 ENCOUNTER — Other Ambulatory Visit: Payer: Self-pay

## 2023-02-03 ENCOUNTER — Encounter: Payer: Self-pay | Admitting: Allergy & Immunology

## 2023-02-03 ENCOUNTER — Ambulatory Visit: Payer: Medicare PPO | Admitting: Allergy & Immunology

## 2023-02-03 VITALS — BP 110/68 | HR 88 | Temp 97.2°F | Ht 63.0 in | Wt 183.7 lb

## 2023-02-03 DIAGNOSIS — J3089 Other allergic rhinitis: Secondary | ICD-10-CM

## 2023-02-03 DIAGNOSIS — E119 Type 2 diabetes mellitus without complications: Secondary | ICD-10-CM

## 2023-02-03 DIAGNOSIS — I10 Essential (primary) hypertension: Secondary | ICD-10-CM

## 2023-02-03 DIAGNOSIS — H9312 Tinnitus, left ear: Secondary | ICD-10-CM | POA: Diagnosis not present

## 2023-02-03 DIAGNOSIS — J302 Other seasonal allergic rhinitis: Secondary | ICD-10-CM | POA: Diagnosis not present

## 2023-02-03 MED ORDER — FLUTICASONE PROPIONATE 50 MCG/ACT NA SUSP: 2.0000 | Freq: Every day | NASAL | 12 refills | Status: AC

## 2023-02-03 MED ORDER — AZELASTINE HCL 0.1 % NA SOLN: 2.0000 | Freq: Two times a day (BID) | NASAL | 12 refills | Status: AC

## 2023-02-03 NOTE — Patient Instructions (Addendum)
1. Seasonal and perennial allergic rhinitis - Testing today showed: grasses, ragweed, weeds, trees, cat, and dog - Copy of test results provided.  - Avoidance measures provided. - Continue with: Flonase (fluticasone) one spray per nostril daily (AIM FOR EAR ON EACH SIDE) - Start taking: Astelin (azelastine) 2 sprays per nostril 2 times daily (AIM FOR THE EAR ON EACH SIDE) - You can use an extra dose of the antihistamine, if needed, for breakthrough symptoms.  - Consider nasal saline rinses 1-2 times daily to remove allergens from the nasal cavities as well as help with mucous clearance (this is especially helpful to do before the nasal sprays are given) - Consider allergy shots as a means of long-term control. - Allergy shots "re-train" and "reset" the immune system to ignore environmental allergens and decrease the resulting immune response to those allergens (sneezing, itchy watery eyes, runny nose, nasal congestion, etc).    - Allergy shots improve symptoms in 75-85% of patients.  - We can discuss more at the next appointment if the medications are not working for you.  2. Tinnitus of left ear - I am not sure that the tinnitus is related to being environmental allergies. - Likewise I cannot guarantee that the allergy shots would help with the tinnitus. - I am not sure what to make of it, especially in relation to the nerve block.  3. Return in about 3 months (around 05/06/2023). You can have the follow up appointment with Dr. Dellis Anes or a Nurse Practicioner (our Nurse Practitioners are excellent and always have Physician oversight!).    Please inform us of any Emergency Department visits, hospitalizations, or changes in symptoms. Call us before going to the ED for breathing or allergy symptoms since we might be able to fit you in for a sick visit. Feel free to contact us anytime with any questions, problems, or concerns.  It was a pleasure to meet you today!  Websites that have reliable  patient information: 1. American Academy of Asthma, Allergy, and Immunology: www.aaaai.org 2. Food Allergy Research and Education (FARE): foodallergy.org 3. Mothers of Asthmatics: http://www.asthmacommunitynetwork.org 4. American College of Allergy, Asthma, and Immunology: www.acaai.org   COVID-19 Vaccine Information can be found at: [email or web address] For questions related to vaccine distribution or appointments, please email [email or web address] or call [phone].   We realize that you might be concerned about having an allergic reaction to the COVID19 vaccines. To help with that concern, WE ARE OFFERING THE COVID19 VACCINES IN OUR OFFICE! Ask the front desk for dates!     "Like" [place] on Facebook and Instagram for our latest updates!      A healthy democracy works best when [employer] participate! Make sure you are registered to vote! If you have moved or changed any of your contact information, you will need to get this updated before voting!  In some cases, you MAY be able to register to vote online: [email or web address]      Airborne Adult Perc - 02/03/23 1400     Time Antigen Placed 1445    Allergen Manufacturer Waynette Buttery    Location Back    Number of Test 55    1. Control-Buffer 50% Glycerol Negative    2. Control-Histamine 2+    3. [place] 4+    4. [place] 4+    5. Johnson 4+    6. Kentucky Blue 4+    7. Meadow Fescue 4+    8. Perennial Rye 4+    9. Timothy 4+    10.  Ragweed Mix 3+    11. Cocklebur 2+    12. Plantain,  English 2+    13. Baccharis 2+    14. Dog Fennel 2+    15. Russian Thistle 3+    16. Lamb's Quarters Negative    17. Sheep Sorrell 2+    18. Rough Pigweed 2+    19. Marsh Elder, Rough Negative    20. Mugwort, Common 2+    21. Box, Elder 2+    22. Cedar, red 3+    23. Sweet Gum 2+    24. Pecan Pollen 2+    25. Pine Mix 2+    26. Walnut, Black Pollen 2+     27. Red Mulberry 4+    28. Ash Mix 3+    29. Birch Mix 4+    30. Beech American 4+    31. Cottonwood, [place] 2+    32. Hickory, White 2+    33. Maple Mix 3+    34. Oak, [place] Mix 3+    35. Sycamore Eastern Negative    36. Alternaria Alternata Negative    37. Cladosporium Herbarum Negative    38. Aspergillus Mix Negative    39. Penicillium Mix Negative    40. Bipolaris Sorokiniana (Helminthosporium) Negative    41. Drechslera Spicifera (Curvularia) Negative    42. Mucor Plumbeus Negative    43. Fusarium Moniliforme Negative    44. Aureobasidium Pullulans (pullulara) Negative    46. Botrytis Cinera Negative    47. Epicoccum Nigrum Negative    48. Phoma Betae Negative    49. Dust Mite Mix Negative    50. Cat Hair 10,000 BAU/ml Negative    51.  Dog Epithelia 2+    52. Mixed Feathers Negative    53. Horse Epithelia Negative    54. Cockroach, German Negative    55. Tobacco Leaf Negative             Intradermal - 02/03/23 1504     Time Antigen Placed 1510    Allergen Manufacturer Waynette Buttery    Location Arm    Number of Test 8    Control Negative    Mold 1 Negative    Mold 2 Negative    Mold 3 Negative    Mold 4 Negative    Mite Mix Negative    Cat 3+    Cockroach Negative             Reducing Pollen Exposure  The American Academy of Allergy, Asthma and Immunology suggests the following steps to reduce your exposure to pollen during allergy seasons.    Do not hang sheets or clothing out to dry; pollen may collect on these items. Do not mow lawns or spend time around freshly cut grass; mowing stirs up pollen. Keep windows closed at night.  Keep car windows closed while driving. Minimize morning activities outdoors, a time when pollen counts are usually at their highest. Stay indoors as much as possible when pollen counts or humidity is high and on windy days when pollen tends to remain in the air longer. Use air conditioning when possible.  Many air conditioners  have filters that trap the pollen spores. Use a HEPA room air filter to remove pollen form the indoor air you breathe.  Control of Dog or Cat Allergen  Avoidance is the best way to manage a dog or cat allergy. If you have a dog or cat and are allergic to dog or cats, consider removing the dog  or cat from the home. If you have a dog or cat but don't want to find it a new home, or if your family wants a pet even though someone in the household is allergic, here are some strategies that may help keep symptoms at bay:  Keep the pet out of your bedroom and restrict it to only a few rooms. Be advised that keeping the dog or cat in only one room will not limit the allergens to that room. Don't pet, hug or kiss the dog or cat; if you do, wash your hands with soap and water. High-efficiency particulate air (HEPA) cleaners run continuously in a bedroom or living room can reduce allergen levels over time. Regular use of a high-efficiency vacuum cleaner or a central vacuum can reduce allergen levels. Giving your dog or cat a bath at least once a week can reduce airborne allergen.  Allergy Shots  Allergies are the result of a chain reaction that starts in the immune system. Your immune system controls how your body defends itself. For instance, if you have an allergy to pollen, your immune system identifies pollen as an invader or allergen. Your immune system overreacts by producing antibodies called Immunoglobulin E (IgE). These antibodies travel to cells that release chemicals, causing an allergic reaction.  The concept behind allergy immunotherapy, whether it is received in the form of shots or tablets, is that the immune system can be desensitized to specific allergens that trigger allergy symptoms. Although it requires time and patience, the payback can be long-term relief. Allergy injections contain a dilute solution of those substances that you are allergic to based upon your skin testing and allergy  history.   How Do Allergy Shots Work?  Allergy shots work much like a vaccine. Your body responds to injected amounts of a particular allergen given in increasing doses, eventually developing a resistance and tolerance to it. Allergy shots can lead to decreased, minimal or no allergy symptoms.  There generally are two phases: build-up and maintenance. Build-up often ranges from three to six months and involves receiving injections with increasing amounts of the allergens. The shots are typically given once or twice a week, though more rapid build-up schedules are sometimes used.  The maintenance phase begins when the most effective dose is reached. This dose is different for each person, depending on how allergic you are and your response to the build-up injections. Once the maintenance dose is reached, there are longer periods between injections, typically two to four weeks.  Occasionally doctors give cortisone-type shots that can temporarily reduce allergy symptoms. These types of shots are different and should not be confused with allergy immunotherapy shots.  Who Can Be Treated with Allergy Shots?  Allergy shots may be a good treatment approach for people with allergic rhinitis (hay fever), allergic asthma, conjunctivitis (eye allergy) or stinging insect allergy.   Before deciding to begin allergy shots, you should consider:   The length of allergy season and the severity of your symptoms  Whether medications and/or changes to your environment can control your symptoms  Your desire to avoid long-term medication use  Time: allergy immunotherapy requires a major time commitment  Cost: may vary depending on your insurance coverage  Allergy shots for children age 56 and older are effective and often well tolerated. They might prevent the onset of new allergen sensitivities or the progression to asthma.  Allergy shots are not started on patients who are pregnant but can be continued on  patients who  become pregnant while receiving them. In some patients with other medical conditions or who take certain common medications, allergy shots may be of risk. It is important to mention other medications you talk to your allergist.   What are the two types of build-ups offered:   RUSH or Rapid Desensitization -- one day of injections lasting from 8:30-4:30pm, injections every 1 hour.  Approximately half of the build-up process is completed in that one day.  The following week, normal build-up is resumed, and this entails ~16 visits either weekly or twice weekly, until reaching your "maintenance dose" which is continued weekly until eventually getting spaced out to every month for a duration of 3 to 5 years. The regular build-up appointments are nurse visits where the injections are administered, followed by required monitoring for 30 minutes.    Traditional build-up -- weekly visits for 6 -12 months until reaching "maintenance dose", then continue weekly until eventually spacing out to every 4 weeks as above. At these appointments, the injections are administered, followed by required monitoring for 30 minutes.     Either way is acceptable, and both are equally effective. With the rush protocol, the advantage is that less time is spent here for injections overall AND you would also reach maintenance dosing faster (which is when the clinical benefit starts to become more apparent). Not everyone is a candidate for rapid desensitization.   IF we proceed with the RUSH protocol, there are premedications which must be taken the day before and the day after the rush only (this includes antihistamines, steroids, and Singulair).  After the rush day, no prednisone or Singulair is required, and we just recommend antihistamines taken on your injection day.  What Is An Estimate of the Costs?  If you are interested in starting allergy injections, please check with your insurance company about your  coverage for both allergy vial sets and allergy injections.  Please do so prior to making the appointment to start injections.  The following are CPT codes to give to your insurance company. These are the amounts we BILL to the insurance company, but the amount YOU WILL PAY and WE RECEIVE IS SUBSTANTIALLY LESS and depends on the contracts we have with different insurance companies.   Amount Billed to Insurance One allergy vial set  CPT 95165   $ 1200      One injection   CPT 95115   $ 35   RUSH (Rapid Desensitization) CPT 95180 x 8 hours $500/hour  Regarding the allergy injections, your co-pay may or may not apply with each injection, so please confirm this with your insurance company. When you start allergy injections, 1 or 2 sets of vials are made based on your allergies.  Not all patients can be on one set of vials. A set of vials lasts 6 months to a year depending on how quickly you can proceed with your build-up of your allergy injections. Vials are personalized for each patient depending on their specific allergens.  How often are allergy injection given during the build-up period?   Injections are given at least weekly during the build-up period until your maintenance dose is achieved. Per the doctor's discretion, you may have the option of getting allergy injections two times per week during the build-up period. However, there must be at least 48 hours between injections. The build-up period is usually completed within 6-12 months depending on your ability to schedule injections and for adjustments for reactions. When maintenance dose is reached, your injection  schedule is gradually changed to every two weeks and later to every three weeks. Injections will then continue every 4 weeks. Usually, injections are continued for a total of 3-5 years.   When Will I Feel Better?  Some may experience decreased allergy symptoms during the build-up phase. For others, it may take as long as 12 months on  the maintenance dose. If there is no improvement after a year of maintenance, your allergist will discuss other treatment options with you.  If you aren't responding to allergy shots, it may be because there is not enough dose of the allergen in your vaccine or there are missing allergens that were not identified during your allergy testing. Other reasons could be that there are high levels of the allergen in your environment or major exposure to non-allergic triggers like tobacco smoke.  What Is the Length of Treatment?  Once the maintenance dose is reached, allergy shots are generally continued for three to five years. The decision to stop should be discussed with your allergist at that time. Some people may experience a permanent reduction of allergy symptoms. Others may relapse and a longer course of allergy shots can be considered.  What Are the Possible Reactions?  The two types of adverse reactions that can occur with allergy shots are local and systemic. Common local reactions include very mild redness and swelling at the injection site, which can happen immediately or several hours after. Report a delayed reaction from your last injection. These include arm swelling or runny nose, watery eyes or cough that occurs within 12-24 hours after injection. A systemic reaction, which is less common, affects the entire body or a particular body system. They are usually mild and typically respond quickly to medications. Signs include increased allergy symptoms such as sneezing, a stuffy nose or hives.   Rarely, a serious systemic reaction called anaphylaxis can develop. Symptoms include swelling in the throat, wheezing, a feeling of tightness in the chest, nausea or dizziness. Most serious systemic reactions develop within 30 minutes of allergy shots. This is why it is strongly recommended you wait in your doctor's office for 30 minutes after your injections. Your allergist is trained to watch for  reactions, and his or her staff is trained and equipped with the proper medications to identify and treat them.   Report to the nurse immediately if you experience any of the following symptoms: swelling, itching or redness of the skin, hives, watery eyes/nose, breathing difficulty, excessive sneezing, coughing, stomach pain, diarrhea, or light headedness. These symptoms may occur within 15-20 minutes after injection and may require medication.   Who Should Administer Allergy Shots?  The preferred location for receiving shots is your prescribing allergist's office. Injections can sometimes be given at another facility where the physician and staff are trained to recognize and treat reactions, and have received instructions by your prescribing allergist.  What if I am late for an injection?   Injection dose will be adjusted depending upon how many days or weeks you are late for your injection.   What if I am sick?   Please report any illness to the nurse before receiving injections. She may adjust your dose or postpone injections depending on your symptoms. If you have fever, flu, sinus infection or chest congestion it is best to postpone allergy injections until you are better. Never get an allergy injection if your asthma is causing you problems. If your symptoms persist, seek out medical care to get your health problem under  control.  What If I am or Become Pregnant:  Women that become pregnant should schedule an appointment with [facility] before receiving any further allergy injections.

## 2023-02-03 NOTE — Progress Notes (Signed)
NEW PATIENT  Date of Service/Encounter:  02/03/23  Consult requested by: Merri Brunette, MD   Assessment:   Seasonal and perennial allergic rhinitis (grasses, ragweed, weeds, trees, cat, and dog)  Tinnitus of left ear - unclear connection to the environmental allergies  Complicated past medical history, including diabetes and hypertension  Plan/Recommendations:   1. Seasonal and perennial allergic rhinitis - Testing today showed: grasses, ragweed, weeds, trees, cat, and dog - Copy of test results provided.  - Avoidance measures provided. - Continue with: Flonase (fluticasone) one spray per nostril daily (AIM FOR EAR ON EACH SIDE) - Start taking: Astelin (azelastine) 2 sprays per nostril 2 times daily (AIM FOR THE EAR ON EACH SIDE) - You can use an extra dose of the antihistamine, if needed, for breakthrough symptoms.  - Consider nasal saline rinses 1-2 times daily to remove allergens from the nasal cavities as well as help with mucous clearance (this is especially helpful to do before the nasal sprays are given) - Consider allergy shots as a means of long-term control. - Allergy shots "re-train" and "reset" the immune system to ignore environmental allergens and decrease the resulting immune response to those allergens (sneezing, itchy watery eyes, runny nose, nasal congestion, etc).    - Allergy shots improve symptoms in 75-85% of patients.  - We can discuss more at the next appointment if the medications are not working for you.  2. Tinnitus of left ear - I am not sure that the tinnitus is related to being environmental allergies. - Likewise I cannot guarantee that the allergy shots would help with the tinnitus. - I am not sure what to make of it, especially in relation to the nerve block.  3. Return in about 3 months (around 05/06/2023). You can have the follow up appointment with Dr. Dellis Anes or a Nurse Practicioner (our Nurse Practitioners are excellent and always have  Physician oversight!).    This note in its entirety was forwarded to the Provider who requested this consultation.  Subjective:   Dominique Dillon is a 67 y.o. female presenting today for evaluation of  Chief Complaint  Patient presents with   Establish Care    Pt states after having a nerve block surgery on left shoulder states she has had a strange sound in her head on the left side feels as though some drainage down the back of her neck on the inside is happening everyday. Also experience itching maybe due to nerve block.    [name] Carreira has a history of the following: Patient Active Problem List   Diagnosis Date Noted   Acute hearing loss, left 12/07/2022   Left temporal headache 12/07/2022   Ear pain, left 12/07/2022   Dyslipidemia, goal LDL below 100 08/29/2015   Metabolic syndrome 08/29/2015   Chest pain with moderate risk for cardiac etiology 05/28/2015   Right carotid bruit 05/28/2015   Intermittent claudication (HCC) 05/28/2015   Hypoxia 05/20/2015   Hypotension 05/20/2015   Chest pain 05/20/2015   Hypokalemia 05/20/2015   Prolonged QT interval 05/20/2015   Syncope 05/20/2015   Chest pain at rest 05/20/2015   Obesity, Class II, BMI 35-39.9    Medication adverse effect    Thalassemia 03/05/2015   Seasonal allergies 02/08/2015   Cancer associated pain 02/08/2015   Glaucoma 12/31/2014   Breast cancer of upper-outer quadrant of left female breast (HCC) 09/12/2014   Abscess of abdominal wall s/p I&D 7/30- & 03/08/2014 03/08/2014   Cellulitis 03/08/2014   Diabetes mellitus type  2, controlled (HCC) 03/08/2014   Essential hypertension, benign 03/08/2014   GERD (gastroesophageal reflux disease) 03/08/2014   Anemia in neoplastic disease 03/08/2014    History obtained from: chart review and patient.  Dominique Dillon was referred by Merri Brunette, MD.     Dominique Dillon is a 68 y.o. female presenting for an evaluation of allergies contributing to her tinnitus .  She had a  nerve block on December 18th. This was for a right side. This entailed one injection. She was asleep the entire time and had the rotator cuff repaired. They did the nerve block for pain control. She is not sure what kind of medication. This was done by Dr. Lucia Gaskins.   She contracted hospital acquired pneumonia following the surgery. She went to Urgent Care on December 19th. She reports that she has a "hissing" or spraying sensation in her left ear. This all started after the nerve block.  This gets very loud, but it never goes away completely.   She does not get sick often. She denies recurrent sinusitis. She does have a history of environmental allergies. She does get this during the fall and the summer months.  She was allergy tested for decades ago.  It is unclear whether she was on shots or not. If so, it is not clear how often she got them. She is currently using Flonase one spray per nostril daily. She is open to trying some antihistamine medications and other medications to help with her symptoms.    She does see [facility] for her sensorineural hearing loss.  Her last visit was in February 2024 with Scot Jun, PA.  At that time, she had a hearing test and hearing aids were offered.  Hearing protection was discussed and masking techniques for the tinnitus were discussed.  Otherwise, there is no history of other atopic diseases, including asthma, food allergies, drug allergies, stinging insect allergies, eczema, urticaria, or contact dermatitis. There is no significant infectious history. Vaccinations are up to date.    Past Medical History: Patient Active Problem List   Diagnosis Date Noted   Acute hearing loss, left 12/07/2022   Left temporal headache 12/07/2022   Ear pain, left 12/07/2022   Dyslipidemia, goal LDL below 100 08/29/2015   Metabolic syndrome 08/29/2015   Chest pain with moderate risk for cardiac etiology 05/28/2015   Right carotid  bruit 05/28/2015   Intermittent claudication (HCC) 05/28/2015   Hypoxia 05/20/2015   Hypotension 05/20/2015   Chest pain 05/20/2015   Hypokalemia 05/20/2015   Prolonged QT interval 05/20/2015   Syncope 05/20/2015   Chest pain at rest 05/20/2015   Obesity, Class II, BMI 35-39.9    Medication adverse effect    Thalassemia 03/05/2015   Seasonal allergies 02/08/2015   Cancer associated pain 02/08/2015   Glaucoma 12/31/2014   Breast cancer of upper-outer quadrant of left female breast (HCC) 09/12/2014   Abscess of abdominal wall s/p I&D 7/30- & 03/08/2014 03/08/2014   Cellulitis 03/08/2014   Diabetes mellitus type 2, controlled (HCC) 03/08/2014   Essential hypertension, benign 03/08/2014   GERD (gastroesophageal reflux disease) 03/08/2014   Anemia in neoplastic disease 03/08/2014    Medication List:  Allergies as of 02/03/2023       Reactions   Crestor [rosuvastatin]    Chest pain, aches   Humalog [insulin Lispro]    Lipitor [atorvastatin]    Chest pain, aches   Macrobid [nitrofurantoin Monohyd Macro] Hives, Itching   Chest pain  Biaxin [clarithromycin] Other (See Comments)   UNKNOWN        Medication List        Accurate as of February 03, 2023 11:59 PM. If you have any questions, ask your nurse or doctor.          azelastine 0.1 % nasal spray Commonly known as: ASTELIN Place 2 sprays into both nostrils 2 (two) times daily. Use in each nostril as directed Started by: Alfonse Spruce, MD   Biotin 5000 MCG Caps Take 1 capsule by mouth daily.   CENTRUM WOMEN PO Take 1 each by mouth daily.   cetirizine 10 MG tablet Commonly known as: ZYRTEC Take 10 mg by mouth daily.   chlorpheniramine-HYDROcodone 10-8 MG/5ML Suer Commonly known as: Tussionex Pennkinetic ER Take 5 mLs by mouth every 12 (twelve) hours as needed for cough.   cholecalciferol 25 MCG (1000 UNIT) tablet Commonly known as: VITAMIN D3 Take 1,000 Units by mouth daily.   CINNAMON PO Take 4,000  mg by mouth daily.   cyanocobalamin 1000 MCG tablet Commonly known as: VITAMIN B12 Take 1,000 mcg by mouth daily.   DULoxetine 60 MG capsule Commonly known as: CYMBALTA Take 60 mg by mouth daily.   FISH OIL PO Take 3 each by mouth daily.   fluticasone 50 MCG/ACT nasal spray Commonly known as: FLONASE Place 2 sprays into both nostrils daily. Started by: Alfonse Spruce, MD   folic acid 400 MCG tablet Commonly known as: FOLVITE Take 400 mcg by mouth daily.   GNP GARLIC EXTRACT PO Take 1,000 mg by mouth daily.   losartan 25 MG tablet Commonly known as: COZAAR Take 1 tablet (25 mg total) by mouth daily. PLEASE SCHEDULE OFFICE VISIT FOR REFILLS OR GET FROM PCP   lovastatin 40 MG tablet Commonly known as: MEVACOR Take 40 mg by mouth daily.   Mounjaro 2.5 MG/0.5ML Pen Generic drug: tirzepatide Inject 2.5 mg under the skin once weekly for 28 days.   Mounjaro 5 MG/0.5ML Pen Generic drug: tirzepatide Inject 5 mg under the skin once weekly for 28 days.   NovoLOG FlexPen 100 UNIT/ML FlexPen Generic drug: insulin aspart Inject 12 Units into the skin with breakfast, with lunch, and with evening meal. Before carb meals.   pantoprazole 40 MG tablet Commonly known as: PROTONIX Take 40 mg by mouth daily.   Evaristo Bury FlexTouch 100 UNIT/ML FlexTouch Pen Generic drug: insulin degludec Inject 30 Units into the skin daily (Increase as directed. Max daily dose 50 units daily)   Ventolin HFA 108 (90 Base) MCG/ACT inhaler Generic drug: albuterol 1-2 PUFFS AS NEEDED EVERY 4 HRS INHALATION        Birth History: non-contributory  Developmental History: non-contributory  Past Surgical History: Past Surgical History:  Procedure Laterality Date   ABDOMINAL HYSTERECTOMY  1995   complete   APPENDECTOMY  1980   BREAST BIOPSY Left 10/15/2015   BREAST BIOPSY Left 09/26/2015   BREAST BIOPSY Left 09/09/2014   BREAST LUMPECTOMY Left 10/01/2014   CARPAL TUNNEL RELEASE Right  08/06/2004   CESAREAN SECTION     x 2   CHOLECYSTECTOMY  1980   COLONOSCOPY     exploratory surgery of abdomen  11/1978   IRRIGATION AND DEBRIDEMENT ABSCESS N/A 03/08/2014   Procedure: IRRIGATION AND DEBRIDEMENT ABDOMINAL WALL ABSCESS;  Surgeon: Ardeth Sportsman, MD;  Location: [facility];  Service: General;  Laterality: N/A;   KNEE ARTHROSCOPY Bilateral    NM MYOVIEW LTD  06/2015   LOW RISK. HTN  response to exercise. No EKG changes. Small, partially reversible apical defect (probably Breast Attenuation, but CRO apical Ischemia).  EF 63% with no RWMA   polyp removed from L breast     PORTACATH PLACEMENT Right 10/21/2014   Procedure: INSERTION PORT-A-CATH;  Surgeon: Emelia Loron, MD;  Location: [facility];  Service: General;  Laterality: Right;   RADIOACTIVE SEED GUIDED PARTIAL MASTECTOMY WITH AXILLARY SENTINEL LYMPH NODE BIOPSY Left 10/01/2014   Procedure: RADIOACTIVE SEED GUIDED LEFT BREAST LUMPECTOMY WITH LEFT  AXILLARY SENTINEL LYMPH NODE BIOPSY;  Surgeon: Emelia Loron, MD;  Location: [facility];  Service: General;  Laterality: Left;   RE-EXCISION OF BREAST LUMPECTOMY Left 10/21/2014   Procedure: RE-EXCISION OF BREAST CANCER, POSTERIOR MARGINS;  Surgeon: Emelia Loron, MD;  Location:  SURGERY CENTER;  Service: General;  Laterality: Left;   TONSILLECTOMY  1980   TRIGGER FINGER RELEASE Right 08/06/2004   middle finger   UPPER GI ENDOSCOPY       Family History: Family History  Problem Relation Age of Onset   Diabetes Mother    Prostate cancer Father    Stomach cancer Maternal Aunt    Pancreatic cancer Maternal Uncle    Lung cancer Maternal Grandmother    Breast cancer Maternal Aunt    Pancreatic cancer Maternal Aunt    Pancreatic cancer Maternal Uncle    Heart attack Maternal Grandfather      Social History: Talithia lives at home with her family.  She lives in a house that is 69 years old.  There is wood downstairs.  They have  electric heating and central cooling.  There are no animals inside or outside of the home.  There are dust mite covers on the bed as well as the pillows.  There is no tobacco exposure.  She currently works as a Conservation officer, nature at [employer].  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  She is not exposed to fumes, chemicals, or dust at home.  There is no HEPA filter.  She does not live near an interstate or industrial area.   Review of Systems  Constitutional: Negative.  Negative for fever, malaise/fatigue and weight loss.  HENT: Negative.  Negative for congestion, ear discharge and ear pain.   Eyes:  Negative for pain, discharge and redness.  Respiratory:  Negative for cough, sputum production, shortness of breath and wheezing.   Cardiovascular: Negative.  Negative for chest pain and palpitations.  Gastrointestinal:  Negative for abdominal pain, heartburn, nausea and vomiting.  Skin: Negative.  Negative for itching and rash.  Neurological:  Negative for dizziness and headaches.  Endo/Heme/Allergies:  Positive for environmental allergies. Does not bruise/bleed easily.       Positive for tinnitus.       Objective:   Blood pressure 110/68, pulse 88, temperature (!) 97.2 F (36.2 C), height 5\' 3"  (1.6 m), weight 183 lb 11.2 oz (83.3 kg), SpO2 97 %. Body mass index is 32.54 kg/m.     Physical Exam Vitals reviewed.  Constitutional:      Appearance: She is well-developed.     Comments: Very lovely. Talkative.   HENT:     Head: Normocephalic and atraumatic.     Right Ear: Tympanic membrane, ear canal and external ear normal. No drainage, swelling or tenderness. Tympanic membrane is not injected, scarred, erythematous, retracted or bulging.     Left Ear: Tympanic membrane, ear canal  and external ear normal. No drainage, swelling or tenderness. Tympanic membrane is not injected, scarred, erythematous, retracted  or bulging.     Nose: No nasal deformity, septal deviation, mucosal edema or rhinorrhea.     Right Sinus: No maxillary sinus tenderness or frontal sinus tenderness.     Left Sinus: No maxillary sinus tenderness or frontal sinus tenderness.     Mouth/Throat:     Mouth: Mucous membranes are not pale and not dry.     Pharynx: Uvula midline.  Eyes:     General:        Right eye: No discharge.        Left eye: No discharge.     Conjunctiva/sclera: Conjunctivae normal.     Right eye: Right conjunctiva is not injected. No chemosis.    Left eye: Left conjunctiva is not injected. No chemosis.    Pupils: Pupils are equal, round, and reactive to light.  Cardiovascular:     Rate and Rhythm: Normal rate and regular rhythm.     Heart sounds: Normal heart sounds.  Pulmonary:     Effort: Pulmonary effort is normal. No tachypnea, accessory muscle usage or respiratory distress.     Breath sounds: Normal breath sounds. No wheezing, rhonchi or rales.  Chest:     Chest wall: No tenderness.  Abdominal:     Tenderness: There is no abdominal tenderness. There is no guarding or rebound.  Lymphadenopathy:     Head:     Right side of head: No submandibular, tonsillar or occipital adenopathy.     Left side of head: No submandibular, tonsillar or occipital adenopathy.     Cervical: No cervical adenopathy.  Skin:    Coloration: Skin is not pale.     Findings: No abrasion, erythema, petechiae or rash. Rash is not papular, urticarial or vesicular.  Neurological:     Mental Status: She is alert.  Psychiatric:        Behavior: Behavior is cooperative.      Diagnostic studies:   Allergy Studies:     Airborne Adult Perc - 02/03/23 1400     Time Antigen Placed 1445    Allergen Manufacturer Waynette Buttery    Location Back    Number of Test 55    1. Control-Buffer 50% Glycerol Negative    2. Control-Histamine 2+    3. Bahia 4+    4. [place] 4+    5. Johnson 4+    6. Kentucky Blue 4+    7. Meadow Fescue 4+     8. Perennial Rye 4+    9. Timothy 4+    10. Ragweed Mix 3+    11. Cocklebur 2+    12. Plantain,  English 2+    13. Baccharis 2+    14. Dog Fennel 2+    15. Russian Thistle 3+    16. Lamb's Quarters Negative    17. Sheep Sorrell 2+    18. Rough Pigweed 2+    19. Marsh Elder, Rough Negative    20. Mugwort, Common 2+    21. Box, Elder 2+    22. Cedar, red 3+    23. Sweet Gum 2+    24. Pecan Pollen 2+    25. Pine Mix 2+    26. Walnut, Black Pollen 2+    27. Red Mulberry 4+    28. Ash Mix 3+    29. Birch Mix 4+    30. Beech American 4+    31. Cottonwood, [place]  2+    32. Hickory, White 2+    33. Maple Mix 3+    34. Oak, [place] Mix 3+    35. Sycamore Eastern Negative    36. Alternaria Alternata Negative    37. Cladosporium Herbarum Negative    38. Aspergillus Mix Negative    39. Penicillium Mix Negative    40. Bipolaris Sorokiniana (Helminthosporium) Negative    41. Drechslera Spicifera (Curvularia) Negative    42. Mucor Plumbeus Negative    43. Fusarium Moniliforme Negative    44. Aureobasidium Pullulans (pullulara) Negative    46. Botrytis Cinera Negative    47. Epicoccum Nigrum Negative    48. Phoma Betae Negative    49. Dust Mite Mix Negative    50. Cat Hair 10,000 BAU/ml Negative    51.  Dog Epithelia 2+    52. Mixed Feathers Negative    53. Horse Epithelia Negative    54. Cockroach, German Negative    55. Tobacco Leaf Negative             Intradermal - 02/03/23 1504     Time Antigen Placed 1510    Allergen Manufacturer Waynette Buttery    Location Arm    Number of Test 8    Control Negative    Mold 1 Negative    Mold 2 Negative    Mold 3 Negative    Mold 4 Negative    Mite Mix Negative    Cat 3+    Cockroach Negative             Allergy testing results were read and interpreted by myself, documented by clinical staff.         Malachi Bonds, MD [facility]

## 2023-02-04 ENCOUNTER — Encounter: Payer: Self-pay | Admitting: Allergy & Immunology

## 2023-02-14 ENCOUNTER — Other Ambulatory Visit (HOSPITAL_BASED_OUTPATIENT_CLINIC_OR_DEPARTMENT_OTHER): Payer: Self-pay

## 2023-02-14 MED ORDER — MOUNJARO 5 MG/0.5ML ~~LOC~~ SOAJ
5.0000 mg | SUBCUTANEOUS | 1 refills | Status: DC
  Filled 2023-02-14: qty 2, 28d supply, fill #0

## 2023-02-15 ENCOUNTER — Other Ambulatory Visit (HOSPITAL_BASED_OUTPATIENT_CLINIC_OR_DEPARTMENT_OTHER): Payer: Self-pay

## 2023-02-15 MED ORDER — MOUNJARO 7.5 MG/0.5ML ~~LOC~~ SOAJ
7.5000 mg | SUBCUTANEOUS | 0 refills | Status: DC
  Filled 2023-02-15: qty 2, 28d supply, fill #0

## 2023-02-26 ENCOUNTER — Other Ambulatory Visit (HOSPITAL_COMMUNITY): Payer: Self-pay

## 2023-03-01 ENCOUNTER — Ambulatory Visit: Payer: Medicare PPO | Admitting: Allergy & Immunology

## 2023-03-14 ENCOUNTER — Ambulatory Visit
Admission: RE | Admit: 2023-03-14 | Discharge: 2023-03-14 | Disposition: A | Discharge status: Home / Self Care | Payer: Medicare PPO | Source: Ambulatory Visit | Attending: Registered Nurse | Admitting: Registered Nurse

## 2023-03-14 DIAGNOSIS — Z1231 Encounter for screening mammogram for malignant neoplasm of breast: Secondary | ICD-10-CM

## 2023-03-16 ENCOUNTER — Telehealth: Payer: Self-pay | Admitting: Neurology

## 2023-03-16 ENCOUNTER — Encounter: Payer: Self-pay | Admitting: Neurology

## 2023-03-16 NOTE — Telephone Encounter (Signed)
Pt sent a mychart message about this. Will forward to Dr Lucia Gaskins.

## 2023-03-16 NOTE — Telephone Encounter (Signed)
Pt called wanting to know if the provider can send in a rx for her Gabapentin. Please advise.

## 2023-06-06 ENCOUNTER — Other Ambulatory Visit: Payer: Self-pay

## 2023-06-15 ENCOUNTER — Telehealth: Payer: Self-pay | Admitting: Pharmacist

## 2023-06-15 NOTE — Progress Notes (Signed)
06/15/2023  Dominique Dillon March 17, 1956 [id number]  Outreach:  Unsuccessful telephone call attempt #1 to patient.   HIPAA compliant voicemail left requesting a return call  Plan:  -I will make another outreach attempt to patient in 7 to 10 business days.   Reynold Bowen, PharmD Clinical Pharmacist [place] Direct Dial: [phone]

## 2023-06-16 ENCOUNTER — Telehealth: Payer: Self-pay | Admitting: Pharmacist

## 2023-06-16 NOTE — Progress Notes (Signed)
  06/16/2023  Dominique Dillon 22-Jan-1956 [id number]   2025 Medication Assistance Renewal Application Summary:  Patient was outreached regarding medication assistance renewal for 2025. Verified address, anticipated insurance for 2025, and income has not changed. Patient remains interested in PAP for 2025 for Tresiba and Novolog, please see below for other medications identified for medication assistance.   Medication Assistance Findings:  Medication assistance needs identified: Mounjaro I have made Dominique Dillon aware there is currently no patient assistance offered for [facility] at this time.    Plan: I will route patient assistance letter to pharmacy technician who will coordinate patient assistance program application process for medications listed above.  Pharmacy technician will assist with obtaining all required documents from both patient and provider(s) and submit application(s) once completed.    Thank you for allowing pharmacy to be a part of this patient's care.  Reynold Bowen, PharmD Clinical Pharmacist [place] Direct Dial: [phone]

## 2023-06-23 ENCOUNTER — Other Ambulatory Visit: Payer: Self-pay | Admitting: Pharmacy Technician

## 2023-06-23 DIAGNOSIS — Z5986 Financial insecurity: Secondary | ICD-10-CM

## 2023-06-23 NOTE — Progress Notes (Signed)
Pharmacy Medication Assistance Program Note    06/23/2023  Patient ID: Kaarin Huet, female   DOB: [birth year], 67 y.o.   MRN: [id number]     06/23/2023  Outreach Medication One  Initial Outreach Date (Medication One) 06/22/2023  Manufacturer Medication One [employer] [facility] Tresiba  Dose of Novolog 100 units/ml  Dose of Tresiba 100 units/ml  Type of Assistance Manufacturer Assistance  Date Application Sent to Patient 06/22/2023  Application Items Requested Application;Proof of Income;Other  Date Application Sent to Prescriber 06/24/2023  Name of Prescriber [facility]       Signature  Pattricia Boss, CPhT [place]  Office: [phone] Fax: [phone] Email: [email or web address]

## 2023-06-30 NOTE — Progress Notes (Unsigned)
Cardiology Office Note:  .   Date:  07/03/2023  ID:  Dominique Dillon, DOB 1955-09-28, MRN [id number] PCP: Dominique Brunette, MD  [facility] Providers Cardiologist:  Bryan Lemma, MD     Chief Complaint  Patient presents with   Shortness of Breath    With some chest discomfort   Dizziness    Pt stated that she has SOB and dizziness    New Patient (Initial Visit)    Return evaluation    Patient Profile: .     Dominique Dillon is a mildly obese 67 y.o. female  with a PMH notable for HTN, poorly controlled HLD, DM-2 (now on insulin & Mounjaro) who presents here to reestablish cardiology care with Complaints of Palpitations and Chest Pain at the request of Dominique Brunette, MD.     Denelle Sifford was last seen by me in January 2017 for follow-up evaluation of a hospitalization for chest pain in October November 2016.  She had sudden onset left sided chest pain with slightly abnormal EKG after taking Macrobid for a UTI.  She also felt as a pressure and aching sensation radiating to the throat.  When she was seen by EMS she was hypotensive and had a syncopal episode followed by continued pain. Myoview November 2016 showed no evidence of ischemia or infarction with hypertensive response to exercise.  EF 60 to 65%.  ALL Risk.  At the follow-up visit, she was doing well with no further symptoms.  Noted dyspnea on exertion with overexertion but otherwise doing well.  We reduced her ARB dose down to 25 mg losartan and held HCTZ.  Subjective  Discussed the use of AI scribe software for clinical note transcription with the patient, who gave verbal consent to proceed.  History of Present Illness   The patient, with a history of diabetes and hypertension, presents with complaints of chest discomfort and palpitations. These symptoms began following a shoulder surgery in December of the previous year, which involved a procedure on the rotator cuff. The patient reports that since the surgery, they have  been experiencing persistent pain and discomfort, and their blood sugar levels have been consistently high, often reaching 300-400 despite insulin therapy.  The patient describes the chest discomfort as being located in the center of the chest, radiating from the shoulder to the chest. The discomfort is often accompanied by a sensation of the heart racing, which the patient describes as feeling like their heart is going to "pump out." These episodes typically last for about five minutes and are often triggered by standing up or bending over. The patient denies any associated symptoms such as shortness of breath, dizziness, or loss of consciousness during these episodes.  The patient also reports a persistent discomfort in the throat, which they describe as a "fullness." They deny any associated symptoms such as burping or difficulty swallowing. The patient has been managing their diabetes with Dominique Dillon and Dominique Dillon, and their hypertension with Lovastatin. However, they report that their blood sugar and cholesterol levels remain poorly controlled.  The patient denies any recent illnesses, changes in medication, or other new symptoms. They report no history of smoking or other lung diseases, but do have seasonal allergies for which they use an inhaler. The patient denies any changes in their daily activities or exercise tolerance, and reports no nocturnal symptoms or positional dyspnea. They also deny any symptoms suggestive of a stroke or gastrointestinal bleeding.  In summary, the patient presents with persistent chest discomfort and palpitations following a  shoulder surgery, along with poorly controlled diabetes and hypertension. The chest discomfort is associated with a sensation of the heart racing and is often triggered by changes in body position. The patient also reports a persistent discomfort in the throat. Despite ongoing treatment, their blood sugar and cholesterol levels remain poorly controlled.       ROS:  Review of Systems - Negative except as noted above.    Objective   Current Meds  Medication Sig   albuterol (VENTOLIN HFA) 108 (90 Base) MCG/ACT inhaler 1-2 PUFFS AS NEEDED EVERY 4 HRS INHALATION   azelastine (ASTELIN) 0.1 % nasal spray Place 2 sprays into both nostrils 2 (two) times daily. Use in each nostril as directed   Biotin 5000 MCG CAPS Take 1 capsule by mouth daily.   cetirizine (ZYRTEC) 10 MG tablet Take 10 mg by mouth daily.   chlorpheniramine-HYDROcodone (TUSSIONEX PENNKINETIC ER) 10-8 MG/5ML SUER Take 5 mLs by mouth every 12 (twelve) hours as needed for cough.   cholecalciferol (VITAMIN D3) 25 MCG (1000 UNIT) tablet Take 1,000 Units by mouth daily.   CINNAMON PO Take 4,000 mg by mouth daily.   cyanocobalamin (VITAMIN B12) 1000 MCG tablet Take 1,000 mcg by mouth daily.   DULoxetine (CYMBALTA) 60 MG capsule Take 60 mg by mouth daily.   fluticasone (FLONASE) 50 MCG/ACT nasal spray Place 2 sprays into both nostrils daily.   folic acid (FOLVITE) 400 MCG tablet Take 400 mcg by mouth daily.   GNP GARLIC EXTRACT PO Take 1,000 mg by mouth daily.   insulin aspart (NOVOLOG FLEXPEN) 100 UNIT/ML FlexPen Inject 12 Units into the skin with breakfast, with lunch, and with evening meal. Before carb meals.   insulin degludec (TRESIBA FLEXTOUCH) 100 UNIT/ML FlexTouch Pen Inject 30 Units into the skin daily (Increase as directed. Max daily dose 50 units daily)   losartan (COZAAR) 25 MG tablet Take 1 tablet (25 mg total) by mouth daily. PLEASE SCHEDULE OFFICE VISIT FOR REFILLS OR GET FROM PCP   lovastatin (MEVACOR) 40 MG tablet Take 40 mg by mouth daily.   Multiple Vitamins-Minerals (CENTRUM WOMEN PO) Take 1 each by mouth daily.   Omega-3 Fatty Acids (FISH OIL PO) Take 3 each by mouth daily.    pantoprazole (PROTONIX) 40 MG tablet Take 40 mg by mouth daily.   tirzepatide [facility]) 2.5 MG/0.5ML Pen Inject 2.5 mg under the skin once weekly for 28 days.   tirzepatide [facility]) 5  MG/0.5ML Pen Inject 5 mg into the skin once a week.   tirzepatide (MOUNJARO) 7.5 MG/0.5ML Pen Inject 7.5 mg into the skin once a week.    Studies Reviewed: Marland Kitchen   EKG Interpretation Date/Time:  Friday July 01 2023 10:48:42 EST Ventricular Rate:  84 PR Interval:  164 QRS Duration:  92 QT Interval:  386 QTC Calculation: 456 R Axis:   -2  Text Interpretation: Normal sinus rhythm Cannot rule out Septal infarct , age undetermined When compared with ECG of 30-Jan-2021 08:26, PREVIOUS ECG IS PRESENT Confirmed by Bryan Lemma ([id number]) on 07/01/2023 11:20:57 AM    Myoview 06/2015: Low risk stress nuclear study with a small, moderate intensity, reversible apical defect; cannot R/O very mild apical ischemia; remaining vascular territories with normal perfusion; EF 63 with normal wall motion.  Nuclear stress EF: 63%. The left ventricular ejection fraction is normal (55-65%). Blood pressure demonstrated a hypertensive response to exercise. There was no ST segment deviation noted during stress. This is a low risk study.  LABS Total cholesterol: 239 mg/dL (  05/2023) Triglycerides: 152 mg/dL (60/4540) HDL: 51 mg/dL (98/1191) LDL: 478 mg/dL (29/5621) H0Q: 6.5% (78/4696) Creatinine: 0.72 mg/dL (29/5284) Potassium: 3.9 mmol/L (11/2022)  Risk Assessment/Calculations:           Physical Exam:   VS:  BP 115/72 (BP Location: Right Arm, Patient Position: Sitting, Cuff Size: Normal)   Pulse 84   Ht 5\' 3"  (1.6 m)   Wt 182 lb (82.6 kg)   SpO2 99%   BMI 32.24 kg/m    Wt Readings from Last 3 Encounters:  07/01/23 182 lb (82.6 kg)  02/03/23 183 lb 11.2 oz (83.3 kg)  12/07/22 186 lb 12.8 oz (84.7 kg)    GEN: Well nourished, well developed in no acute distress; mildly obese but otherwise well-groomed. NECK: No JVD; No carotid bruits CARDIAC: Normal S1, S2; RRR, no murmurs, rubs, gallops; mild tenderness to palpation along the left sternal border in the upper chest RESPIRATORY:  Clear to  auscultation without rales, wheezing or rhonchi ; nonlabored, good air movement. ABDOMEN: Soft, non-tender, non-distended EXTREMITIES:  No edema; No deformity     ASSESSMENT AND PLAN: .    Problem List Items Addressed This Visit       Cardiology Problems   Dyslipidemia, goal LDL below 100 (Chronic)    Total cholesterol 239, LDL 160 despite Lovastatin therapy. -Consider more potent statin therapy at follow-up visit.  Depending on results of event monitor and ongoing symptoms of chest discomfort, may Consider Coronary CTA or Coronary Calcium Scoring      Relevant Orders   EKG 12-Lead (Completed)   Essential hypertension, benign (Chronic)    Blood pressure pretty well-controlled on low-dose losartan 25 mg daily-appropriate for diabetic.        Other   Diabetes mellitus, type II, insulin dependent (HCC) (Chronic)    Poorly Controlled Diabetes Blood sugars frequently in the 300-400 range despite insulin therapy. -Continue current insulin regimen (Mounjaro, Novolog, [place]). -Consider more aggressive diabetes management at follow-up visit.      Metabolic syndrome (Chronic)    Combination of hypertension, diabetes, obesity and elevated triglycerides.  Points are high risk for CAD. Will evaluate precordial pain/shoulder pain with event monitor, pending those results may want to consider either Coronary Calcium Score versus Coronary CTA.      Relevant Orders   EKG 12-Lead (Completed)   Obesity, Class II, BMI 35-39.9 (Chronic)    Component of metabolic syndrome.  Discussed importance of dietary modification and increased exercise.      Precordial pain - Primary (Chronic)    Central chest pain associated with palpitations and dizziness, lasting for about 5 minutes. No clear exertional component. No associated shortness of breath, syncope, or stroke-like symptoms. -Order a 85-month heart monitor to evaluate for arrhythmias. -If symptoms persist and monitor is negative, consider  further cardiac workup including coronary calcium score or CT angiogram of the coronary arteries.      Relevant Orders   EKG 12-Lead (Completed)   LONG TERM MONITOR (3-14 DAYS)   LONG TERM MONITOR (3-14 DAYS)   Racing heart beat    Intermittent episodes of chest fluttering/palpitations not having all that frequently.  This is what seems to be the cause of her chest discomfort.  Plan: 2 back-to-back 14-day Zio patch monitors      Relevant Orders   LONG TERM MONITOR (3-14 DAYS)   LONG TERM MONITOR (3-14 DAYS)       Follow-Up: Return in about 2 months (around 08/31/2023) for Routine Follow-up after testing ~  1-2 months, Follow-up with APP, 3-4 month follow-up. Schedule follow-up appointment in late January 2025 with either Peggye Pitt or Irving Burton to review results of heart monitor and discuss management of diabetes and hyperlipidemia along with potential Coronary CTA vs. Coronary Calcium Score depending on results of monitor and presence of precordial pain. Roughly 45-month follow-up with MD  Total time spent: 22 min spent with patient + 16 min spent charting = 36 min    Signed, Marykay Lex, MD, MS Bryan Lemma, M.D., M.S. Interventional Cardiologist  [facility]  Pager # [phone] Phone # [phone] [address] [place], [place] [address]

## 2023-07-01 ENCOUNTER — Encounter: Payer: Self-pay | Admitting: Cardiology

## 2023-07-01 ENCOUNTER — Ambulatory Visit: Payer: Medicare PPO | Attending: Cardiology | Admitting: Cardiology

## 2023-07-01 ENCOUNTER — Ambulatory Visit: Payer: Medicare PPO | Attending: Cardiology

## 2023-07-01 ENCOUNTER — Ambulatory Visit: Payer: Medicare PPO

## 2023-07-01 VITALS — BP 115/72 | HR 84 | Ht 63.0 in | Wt 182.0 lb

## 2023-07-01 DIAGNOSIS — E8881 Metabolic syndrome: Secondary | ICD-10-CM

## 2023-07-01 DIAGNOSIS — R000 Tachycardia, unspecified: Secondary | ICD-10-CM

## 2023-07-01 DIAGNOSIS — R42 Dizziness and giddiness: Secondary | ICD-10-CM

## 2023-07-01 DIAGNOSIS — R0602 Shortness of breath: Secondary | ICD-10-CM | POA: Diagnosis not present

## 2023-07-01 DIAGNOSIS — R072 Precordial pain: Secondary | ICD-10-CM | POA: Diagnosis not present

## 2023-07-01 DIAGNOSIS — R079 Chest pain, unspecified: Secondary | ICD-10-CM

## 2023-07-01 DIAGNOSIS — I10 Essential (primary) hypertension: Secondary | ICD-10-CM | POA: Diagnosis not present

## 2023-07-01 DIAGNOSIS — E119 Type 2 diabetes mellitus without complications: Secondary | ICD-10-CM | POA: Diagnosis not present

## 2023-07-01 DIAGNOSIS — E66812 Obesity, class 2: Secondary | ICD-10-CM

## 2023-07-01 DIAGNOSIS — E785 Hyperlipidemia, unspecified: Secondary | ICD-10-CM

## 2023-07-01 NOTE — Progress Notes (Unsigned)
Enrolled patient for a 14 day Zio XT monitor to be mailed to patients home  Pt needs to wear 2 week monitors consecutively

## 2023-07-01 NOTE — Patient Instructions (Addendum)
Medication Instructions:  No changes  *If you need a refill on your cardiac medications before your next appointment, please call your pharmacy*   Lab Work: None needed   Testing/Procedures: You will receive 2 separate monitors to wear for 2 weeks times 2 (total of  4 weeks)   ZIO XT- Long Term Monitor Instructions  Your physician has requested you wear a ZIO patch monitor for 14 days times 2.  This is a single patch monitor. [employer] supplies one patch monitor per enrollment. Additional stickers are not available. Please do not apply patch if you will be having a Nuclear Stress Test,  Echocardiogram, Cardiac CT, MRI, or Chest Xray during the period you would be wearing the  monitor. The patch cannot be worn during these tests. You cannot remove and re-apply the  ZIO XT patch monitor.  Your ZIO patch monitor will be mailed 3 day USPS to your address on file. It may take 3-5 days  to receive your monitor after you have been enrolled.  Once you have received your monitor, please review the enclosed instructions. Your monitor  has already been registered assigning a specific monitor serial # to you.  Billing and Patient Assistance Program Information  We have supplied [employer] with any of your insurance information on file for billing purposes. [employer] offers a sliding scale Patient Assistance Program for patients that do not have  insurance, or whose insurance does not completely cover the cost of the ZIO monitor.  You must apply for the Patient Assistance Program to qualify for this discounted rate.  To apply, please call [employer] at [phone], select option 4, select option 2, ask to apply for  Patient Assistance Program. Meredeth Ide will ask your household income, and how many people  are in your household. They will quote your out-of-pocket cost based on that information.  [employer] will also be able to set up a 35-month, interest-free payment plan if needed.  Applying the monitor    Shave hair from upper left chest.  Hold abrader disc by orange tab. Rub abrader in 40 strokes over the upper left chest as  indicated in your monitor instructions.  Clean area with 4 enclosed alcohol pads. Let dry.  Apply patch as indicated in monitor instructions. Patch will be placed under collarbone on left  side of chest with arrow pointing upward.  Rub patch adhesive wings for 2 minutes. Remove white label marked "1". Remove the white  label marked "2". Rub patch adhesive wings for 2 additional minutes.  While looking in a mirror, press and release button in center of patch. A small green light will  flash 3-4 times. This will be your only indicator that the monitor has been turned on.  Do not shower for the first 24 hours. You may shower after the first 24 hours.  Press the button if you feel a symptom. You will hear a small click. Record Date, Time and  Symptom in the Patient Logbook.  When you are ready to remove the patch, follow instructions on the last 2 pages of Patient  Logbook. Stick patch monitor onto the last page of Patient Logbook.  Place Patient Logbook in the blue and white box. Use locking tab on box and tape box closed  securely. The blue and white box has prepaid postage on it. Please place it in the mailbox as  soon as possible. Your physician should have your test results approximately 7 days after the  monitor has been mailed back to  [employer].  Call [facility] Customer Care at [phone] if you have questions regarding  your ZIO XT patch monitor. Call them immediately if you see an orange light blinking on your  monitor.  If your monitor falls off in less than 4 days, contact our Monitor department at [phone].  If your monitor becomes loose or falls off after 4 days call [employer] at [phone] for  suggestions on securing your monitor    Follow-Up: At [facility], you and your health needs are our priority.  As part of our  continuing mission to provide you with exceptional heart care, we have created designated Provider Care Teams.  These Care Teams include your primary Cardiologist (physician) and Advanced Practice Providers (APPs -  Physician Assistants and Nurse Practitioners) who all work together to provide you with the care you need, when you need it.   Your next appointment:   2 month(s) Late January   Provider:   Joni Reining, DNP, ANP

## 2023-07-01 NOTE — Progress Notes (Unsigned)
Enrolled patient for a 14 day Zio XT monitor to be mailed to patients home    Pt needs to wear 2 week monitors consecutively

## 2023-07-03 ENCOUNTER — Encounter: Payer: Self-pay | Admitting: Cardiology

## 2023-07-03 NOTE — Assessment & Plan Note (Signed)
Poorly Controlled Diabetes Blood sugars frequently in the 300-400 range despite insulin therapy. -Continue current insulin regimen Greggory Keen, Novolog, [place]). -Consider more aggressive diabetes management at follow-up visit.

## 2023-07-03 NOTE — Assessment & Plan Note (Signed)
Central chest pain associated with palpitations and dizziness, lasting for about 5 minutes. No clear exertional component. No associated shortness of breath, syncope, or stroke-like symptoms. -Order a 41-month heart monitor to evaluate for arrhythmias. -If symptoms persist and monitor is negative, consider further cardiac workup including coronary calcium score or CT angiogram of the coronary arteries.

## 2023-07-03 NOTE — Assessment & Plan Note (Signed)
Blood pressure pretty well-controlled on low-dose losartan 25 mg daily-appropriate for diabetic.

## 2023-07-03 NOTE — Assessment & Plan Note (Signed)
Total cholesterol 239, LDL 160 despite Lovastatin therapy. -Consider more potent statin therapy at follow-up visit.  Depending on results of event monitor and ongoing symptoms of chest discomfort, may Consider Coronary CTA or Coronary Calcium Scoring

## 2023-07-03 NOTE — Assessment & Plan Note (Signed)
Intermittent episodes of chest fluttering/palpitations not having all that frequently.  This is what seems to be the cause of her chest discomfort.  Plan: 2 back-to-back 14-day Zio patch monitors

## 2023-07-03 NOTE — Assessment & Plan Note (Signed)
Component of metabolic syndrome.  Discussed importance of dietary modification and increased exercise.

## 2023-07-03 NOTE — Assessment & Plan Note (Addendum)
Combination of hypertension, diabetes, obesity and elevated triglycerides.  Points are high risk for CAD. Will evaluate precordial pain/shoulder pain with event monitor, pending those results may want to consider either Coronary Calcium Score versus Coronary CTA.

## 2023-07-04 ENCOUNTER — Encounter: Payer: Self-pay | Admitting: Cardiology

## 2023-07-04 NOTE — Telephone Encounter (Signed)
Do not read too much into it.  It is a common finding on an EKG and we say cannot rule out prior heart attack, but if you would have had a heart attack involving this area, we would have known.  It is just something that can be seen in that situation.  But usually if someone's had a heart attack involving that part of the heart he would have had a pretty significant heart attack and would have known about it.  No I do not think you had a heart attack.  More often than not in people have never had a history of heart disease, this is just a benign finding.   Bryan Lemma, MD

## 2023-07-19 ENCOUNTER — Telehealth: Payer: Self-pay | Admitting: Pharmacy Technician

## 2023-07-19 DIAGNOSIS — Z5986 Financial insecurity: Secondary | ICD-10-CM

## 2023-07-19 NOTE — Progress Notes (Signed)
Pharmacy Medication Assistance Program Note    07/19/2023  Patient ID: Dominique Dillon, female   DOB: 17-Jun-1956, 67 y.o.   MRN: [id number]     06/23/2023 07/19/2023  Outreach Medication One  Initial Outreach Date (Medication One) 06/22/2023   Manufacturer Medication One [employer] [facility] Tresiba   Dose of Novolog 100 units/ml   Dose of Tresiba 100 units/ml   Type of Assistance Manufacturer Assistance   Date Application Sent to Patient 06/22/2023   Application Items Requested Application;Proof of Income;Other   Date Application Sent to Prescriber 06/24/2023   Name of Prescriber Merri Brunette   Date Application Received From Patient  07/13/2023  Application Items Received From Patient  Application;Proof of Income;Other  Date Application Received From Provider  07/04/2023  Date Application Submitted to Manufacturer  07/19/2023  Method Application Sent to Manufacturer  Fax       Signature  Kristopher Glee [facility]  Office: [phone] Fax: [phone] Email: [email or web address]

## 2023-08-15 ENCOUNTER — Telehealth: Payer: Self-pay | Admitting: Pharmacy Technician

## 2023-08-15 DIAGNOSIS — Z5986 Financial insecurity: Secondary | ICD-10-CM

## 2023-08-15 NOTE — Progress Notes (Signed)
 Pharmacy Medication Assistance Program Note    08/15/2023  Patient ID: Dominique Dillon, female   DOB: 04/10/1956, 68 y.o.   MRN: [id number]     06/23/2023 07/19/2023 08/15/2023  Outreach Medication One  Initial Outreach Date (Medication One) 06/22/2023    Manufacturer Medication One Novo Nordisk    [employer] Tresiba     Dose of Novolog  100 units/ml    Dose of Tresiba  100 units/ml    Type of Assistance Manufacturer Assistance    Date Application Sent to Patient 06/22/2023    Application Items Requested Application;Proof of Income;Other    Date Application Sent to Prescriber 06/24/2023    Name of Prescriber Ryan Hives    Date Application Received From Patient  07/13/2023   Application Items Received From Patient  Application;Proof of Income;Other   Date Application Received From Provider  07/04/2023   Date Application Submitted to Manufacturer  07/19/2023   Method Application Sent to Manufacturer  Fax   Patient Assistance Determination   Approved  Approval Start Date   08/10/2023  Approval End Date   08/08/2024  Patient Notification Method   Telephone Call  Telephone Call Outcome   Successful   Care coordination call placed to Novo Nordisk in regard to Tresib and Novolog  application.  Per IVR system, patient is APPROVED 08/10/23-08/08/24. Medication and subsequent refills will auto fill and ship to prescriber's office. Patient may call [employer] at any time to check on next shipment by calling [phone].  Successful outreach to patient. HIPAA verified. Patient was informed of approval, refill procedure and when to expect next shipment  Signature Kate Caddy, CPhT [place]  Office: [phone] Fax: [phone] Email: [email or web address]

## 2023-08-30 NOTE — Progress Notes (Unsigned)
  Cardiology Office Note:  .   Date:  09/01/2023  ID:  Dominique Dillon, DOB May 24, 1956, MRN [id number] PCP: Merri Brunette, MD  [facility] Providers Cardiologist:  Bryan Lemma, MD }   History of Present Illness: Dominique Dillon is a 68 y.o. female with history of dyslipidemia, essential hypertension, type 2 diabetes, chronic metabolic syndrome, precordial pain which is chronic.  Class II obesity, intermittent episodes of tachycardia.  Last seen by Dr. Herbie Baltimore on 07/01/2023 the patient had a ZIO monitor placed.    Monitor was completed on 08/03/2023.  Minimum heart rate 60 bpm maximal heart rate of 143 bpm with average heart rate of 89 bpm.  Predominant underlying rhythm was sinus rhythm.  She had isolated SVE less than 1%.  No significant arrhythmias.  She comes today with continued complaints of headaches and neuralgia pain of her left shoulder and left arm.  She denies any chest pain.  She has been medically compliant.  ROS: As above otherwise negative  Studies Reviewed: Marland Kitchen      Zio Monitor 07/28/2023 Patch Wear Time:  13 days and 23 hours (2024-11-26T16:24:41-0500 to 2024-12-10T16:24:37-0500)   Patient had a min HR of 66 bpm, max HR of 160 bpm, and avg HR of 86 bpm. Predominant underlying rhythm was Sinus Rhythm. Slight P wave morphology changes were noted. First Degree AV Block was present. 1 run of Ventricular Tachycardia occurred lasting 5  beats with a max rate of 160 bpm (avg 142 bpm). Isolated SVEs were rare (<1.0%), SVE Couplets were rare (<1.0%), and no SVE Triplets were present. Isolated VEs were rare (<1.0%), VE Couplets were rare (<1.0%), and no VE Triplets were present. Ventricular  Bigeminy was present.    Physical Exam:   VS:  BP 100/64   Pulse 82   Ht 5\' 4"  (1.626 m)   Wt 184 lb (83.5 kg)   SpO2 98%   BMI 31.58 kg/m    Wt Readings from Last 3 Encounters:  09/01/23 184 lb (83.5 kg)  07/01/23 182 lb (82.6 kg)  02/03/23 183 lb 11.2 oz (83.3 kg)    GEN:  Well nourished, well developed in no acute distress NECK: No JVD; No carotid bruits CARDIAC: RRR, no murmurs, rubs, gallops RESPIRATORY:  Clear to auscultation without rales, wheezing or rhonchi  ABDOMEN: Soft, non-tender, non-distended EXTREMITIES:  No edema; No deformity   ASSESSMENT AND PLAN: .    Palpitations: ZIO monitor only revealed 1 episode of SVT.  Otherwise essentially normal sinus rhythm.  I have gone over this test with her and given her copy.  Reassurance is given.  No changes in her medication regimen at this time.  2.  Hypertension: Blood pressure is very well-controlled currently.  She is not complaining of any dizziness or lightheaded.  Actually blood pressure is little soft today.  I will not make any changes in her medicines today unless she becomes symptomatic.  3.  Type 2 diabetes: She reports that her blood sugar has remained very elevated.  She is due to follow-up with endocrinologist next month.  Some of this may be diabetic neuropathy causing her discomfort.  Will defer         Signed, Bettey Mare. Liborio Nixon, ANP, AACC

## 2023-09-01 ENCOUNTER — Encounter: Payer: Self-pay | Admitting: Adult Health

## 2023-09-01 ENCOUNTER — Ambulatory Visit: Payer: Medicare PPO | Attending: Adult Health | Admitting: Adult Health

## 2023-09-01 VITALS — BP 100/64 | HR 82 | Ht 64.0 in | Wt 184.0 lb

## 2023-09-01 DIAGNOSIS — I10 Essential (primary) hypertension: Secondary | ICD-10-CM | POA: Diagnosis not present

## 2023-09-01 DIAGNOSIS — R002 Palpitations: Secondary | ICD-10-CM | POA: Diagnosis not present

## 2023-09-01 DIAGNOSIS — E119 Type 2 diabetes mellitus without complications: Secondary | ICD-10-CM | POA: Diagnosis not present

## 2023-09-01 DIAGNOSIS — Z794 Long term (current) use of insulin: Secondary | ICD-10-CM

## 2023-09-01 NOTE — Patient Instructions (Signed)
 Medication Instructions:  No changes *If you need a refill on your cardiac medications before your next appointment, please call your pharmacy*   Lab Work: No Labs If you have labs (blood work) drawn today and your tests are completely normal, you will receive your results only by: MyChart Message (if you have MyChart) OR A paper copy in the mail If you have any lab test that is abnormal or we need to change your treatment, we will call you to review the results.   Testing/Procedures: No Testing   Follow-Up: At [facility], you and your health needs are our priority.  As part of our continuing mission to provide you with exceptional heart care, we have created designated Provider Care Teams.  These Care Teams include your primary Cardiologist (physician) and Advanced Practice Providers (APPs -  Physician Assistants and Nurse Practitioners) who all work together to provide you with the care you need, when you need it.  We recommend signing up for the patient portal called "MyChart".  Sign up information is provided on this After Visit Summary.  MyChart is used to connect with patients for Virtual Visits (Telemedicine).  Patients are able to view lab/test results, encounter notes, upcoming appointments, etc.  Non-urgent messages can be sent to your provider as well.   To learn more about what you can do with MyChart, go to [email or web address].    Your next appointment:   1 year(s)  Provider:   Bryan Lemma, MD

## 2023-09-08 DIAGNOSIS — I10 Essential (primary) hypertension: Secondary | ICD-10-CM | POA: Diagnosis not present

## 2023-09-08 DIAGNOSIS — R748 Abnormal levels of other serum enzymes: Secondary | ICD-10-CM | POA: Diagnosis not present

## 2023-09-08 DIAGNOSIS — E1169 Type 2 diabetes mellitus with other specified complication: Secondary | ICD-10-CM | POA: Diagnosis not present

## 2023-09-08 DIAGNOSIS — E7800 Pure hypercholesterolemia, unspecified: Secondary | ICD-10-CM | POA: Diagnosis not present

## 2023-09-08 DIAGNOSIS — Z794 Long term (current) use of insulin: Secondary | ICD-10-CM | POA: Diagnosis not present

## 2023-09-08 DIAGNOSIS — E1165 Type 2 diabetes mellitus with hyperglycemia: Secondary | ICD-10-CM | POA: Diagnosis not present

## 2023-09-20 ENCOUNTER — Other Ambulatory Visit (HOSPITAL_BASED_OUTPATIENT_CLINIC_OR_DEPARTMENT_OTHER): Payer: Self-pay

## 2023-09-20 MED ORDER — DEXCOM G7 SENSOR MISC
1.0000 | 11 refills | Status: DC
  Filled 2023-09-20: qty 3, 30d supply, fill #0
  Filled 2023-11-07: qty 3, 30d supply, fill #1
  Filled 2024-02-16 – 2024-05-22 (×2): qty 3, 30d supply, fill #2

## 2023-09-20 MED ORDER — ALBUTEROL SULFATE HFA 108 (90 BASE) MCG/ACT IN AERS
1.0000 | INHALATION_SPRAY | RESPIRATORY_TRACT | 3 refills | Status: AC | PRN
  Filled 2023-09-20: qty 6.7, 17d supply, fill #0
  Filled 2024-08-27: qty 6.7, 17d supply, fill #1
  Filled 2024-08-27: qty 6.7, 25d supply, fill #0

## 2023-09-21 ENCOUNTER — Other Ambulatory Visit: Payer: Self-pay

## 2023-09-21 ENCOUNTER — Other Ambulatory Visit (HOSPITAL_BASED_OUTPATIENT_CLINIC_OR_DEPARTMENT_OTHER): Payer: Self-pay

## 2023-09-21 DIAGNOSIS — R197 Diarrhea, unspecified: Secondary | ICD-10-CM | POA: Diagnosis not present

## 2023-09-21 DIAGNOSIS — D649 Anemia, unspecified: Secondary | ICD-10-CM | POA: Diagnosis not present

## 2023-09-21 MED ORDER — PANTOPRAZOLE SODIUM 40 MG PO TBEC
40.0000 mg | DELAYED_RELEASE_TABLET | Freq: Two times a day (BID) | ORAL | 0 refills | Status: DC
  Filled 2023-09-21: qty 180, 90d supply, fill #0

## 2023-09-21 MED ORDER — HYOSCYAMINE SULFATE 0.125 MG PO TBDP
0.1250 mg | ORAL_TABLET | ORAL | 5 refills | Status: AC | PRN
  Filled 2023-09-21: qty 120, 20d supply, fill #0

## 2023-09-26 DIAGNOSIS — R197 Diarrhea, unspecified: Secondary | ICD-10-CM | POA: Diagnosis not present

## 2023-11-07 ENCOUNTER — Other Ambulatory Visit (HOSPITAL_BASED_OUTPATIENT_CLINIC_OR_DEPARTMENT_OTHER): Payer: Self-pay

## 2023-11-07 MED ORDER — MOUNJARO 7.5 MG/0.5ML ~~LOC~~ SOAJ
7.5000 mg | SUBCUTANEOUS | 3 refills | Status: DC
  Filled 2023-11-07: qty 2, 28d supply, fill #0
  Filled 2024-03-07: qty 2, 28d supply, fill #1

## 2023-11-25 ENCOUNTER — Telehealth: Payer: Self-pay | Admitting: Neurology

## 2023-11-25 NOTE — Telephone Encounter (Signed)
 Pt has rescheduled due to an earlier appointment that became available

## 2023-11-27 ENCOUNTER — Encounter: Payer: Self-pay | Admitting: Cardiology

## 2023-11-27 DIAGNOSIS — R079 Chest pain, unspecified: Secondary | ICD-10-CM | POA: Diagnosis not present

## 2023-11-27 DIAGNOSIS — R000 Tachycardia, unspecified: Secondary | ICD-10-CM | POA: Diagnosis not present

## 2023-12-06 DIAGNOSIS — E1169 Type 2 diabetes mellitus with other specified complication: Secondary | ICD-10-CM | POA: Diagnosis not present

## 2023-12-06 DIAGNOSIS — E7800 Pure hypercholesterolemia, unspecified: Secondary | ICD-10-CM | POA: Diagnosis not present

## 2023-12-06 DIAGNOSIS — I10 Essential (primary) hypertension: Secondary | ICD-10-CM | POA: Diagnosis not present

## 2023-12-06 DIAGNOSIS — Z794 Long term (current) use of insulin: Secondary | ICD-10-CM | POA: Diagnosis not present

## 2023-12-22 DIAGNOSIS — N3000 Acute cystitis without hematuria: Secondary | ICD-10-CM | POA: Diagnosis not present

## 2024-01-10 ENCOUNTER — Other Ambulatory Visit (HOSPITAL_BASED_OUTPATIENT_CLINIC_OR_DEPARTMENT_OTHER): Payer: Self-pay

## 2024-01-10 DIAGNOSIS — L84 Corns and callosities: Secondary | ICD-10-CM | POA: Diagnosis not present

## 2024-01-10 DIAGNOSIS — I10 Essential (primary) hypertension: Secondary | ICD-10-CM | POA: Diagnosis not present

## 2024-01-10 DIAGNOSIS — Z794 Long term (current) use of insulin: Secondary | ICD-10-CM | POA: Diagnosis not present

## 2024-01-10 DIAGNOSIS — E7800 Pure hypercholesterolemia, unspecified: Secondary | ICD-10-CM | POA: Diagnosis not present

## 2024-01-10 DIAGNOSIS — E1169 Type 2 diabetes mellitus with other specified complication: Secondary | ICD-10-CM | POA: Diagnosis not present

## 2024-01-10 MED ORDER — MOUNJARO 7.5 MG/0.5ML ~~LOC~~ SOAJ
7.5000 mg | SUBCUTANEOUS | 3 refills | Status: DC
  Filled 2024-01-10 – 2024-02-15 (×2): qty 2, 28d supply, fill #0

## 2024-01-10 MED ORDER — DEXCOM G7 SENSOR MISC
5 refills | Status: AC
  Filled 2024-01-10 – 2024-02-22 (×3): qty 3, 30d supply, fill #0
  Filled 2024-03-23: qty 3, 30d supply, fill #1
  Filled 2024-04-19: qty 3, 30d supply, fill #2
  Filled 2024-05-27 – 2024-06-26 (×2): qty 3, 30d supply, fill #3

## 2024-01-16 ENCOUNTER — Ambulatory Visit: Admitting: Neurology

## 2024-01-16 ENCOUNTER — Encounter: Payer: Self-pay | Admitting: Neurology

## 2024-01-16 VITALS — BP 114/72 | HR 76 | Ht 63.0 in | Wt 180.0 lb

## 2024-01-16 DIAGNOSIS — G8929 Other chronic pain: Secondary | ICD-10-CM

## 2024-01-16 DIAGNOSIS — M542 Cervicalgia: Secondary | ICD-10-CM | POA: Diagnosis not present

## 2024-01-16 DIAGNOSIS — H538 Other visual disturbances: Secondary | ICD-10-CM | POA: Diagnosis not present

## 2024-01-16 DIAGNOSIS — R519 Headache, unspecified: Secondary | ICD-10-CM | POA: Diagnosis not present

## 2024-01-16 DIAGNOSIS — R202 Paresthesia of skin: Secondary | ICD-10-CM

## 2024-01-16 DIAGNOSIS — R29898 Other symptoms and signs involving the musculoskeletal system: Secondary | ICD-10-CM

## 2024-01-16 DIAGNOSIS — R200 Anesthesia of skin: Secondary | ICD-10-CM

## 2024-01-16 DIAGNOSIS — M5412 Radiculopathy, cervical region: Secondary | ICD-10-CM | POA: Diagnosis not present

## 2024-01-16 MED ORDER — PREGABALIN 25 MG PO CAPS: ORAL_CAPSULE | ORAL | 6 refills | Status: AC

## 2024-01-16 NOTE — Progress Notes (Unsigned)
 [facility]    Provider:  Dr Tresia Dillon Requesting Provider: [name], Dillon Primary Care Provider:  [name], Dillon  CC:  recurrent headache  Consult: Recurrent headache.  01/16/2024: New consult for recurrent headache. She was seen in 11/2022 for tinnitus and mri brain and mra of the head were unremarkable. Has had esr/crp checked in the past for headaches. She has had headaches since she fell back and hit her head December 19th 2023 she has neck pain, more neck pain, radiates down the neck, shoots up from the back of the head to the eye. Headache starts on the left side(points to the parietal area) and radiates to the left forehead and then back down the neck. A lot of neck tightness. She has a lot of neck muscle tightness and that is contributory, can't describe it just hurts. Radiates down the left arm to right elbow an dthe hand goes to sleep. She was on gabapentin  in the past for her bad legs, she is going to a podiatrist. She states she can't see as clear out of her left eye. She has jaw list. I don;t think she needs steroids as this is chronic ongoing since 2023 and had esr/crp in the past but will check again. In pain 24x7. Did well on gabapentin  but 3x a day is something that is diffult start lyrica. Pain every day, terrible, chronic pain 5-6/10 in the head and in the leg. Ongoing 2 years, been under the care of physicians, failed medical and conservative management.     MRI brain/MRA 5/30/224: IMPRESSION:   This MRI of the brain with and without contrast shows the following: Few punctate T2/FLAIR hypertense foci in the subcortical and deep white matter of the cerebral hemispheres consistent with minimal age-appropriate chronic microvascular ischemic changes.  None of the foci appear to be acute.  They do not enhance. Internal auditory canals appear normal. Normal enhancement pattern.  No acute findings.  MRA head:  IMPRESSION: This MR angiogram of the intracranial  arteries shows the following: There appears to be mild stenosis of the cavernous and clinoid segments of the right internal carotid artery on the reconstructed views not clearly seen on the source images.   As only seen on the reconstructed images, this could also be artifact.  This would not be hemodynamically significant and would not be expected to cause symptoms. Other arteries appear normal.  From a thorough review of records and patient report, Medications tried that can be used in migraine/headache management greater than 3 months include: Lifestyle modification, headache diaries, better sleep hygiene, exercise, management of migraine triggers, OTC and prescribed analgesics/nsaids such as ibuprofen, excedrin, alleve and others, cymbalta , gabapentin ,   Patient complains of symptoms per HPI as well as the following symptoms: leg pain,  . Pertinent negatives and positives per HPI. All others negative    HPI 12/07/2022:  Dominique Dillon is a 68 y.o. female here as requested by Dominique Dillon for tinnitus.Pmhx includes diabetes, HLD, HTN, malignant breast cancer, depression.  Patient here alone. She reports in December the 18th she had surgery on her shoulder. In 2016 she had breast cancer. norse started an IV in the right lower arm, she had a nerve block for anesthesia for the shoulder surgery. When she got home she was totally "out of it" she was very hyped up. She has aspirational pneumonia.That's when she started hearing left noises after the nerve block, [name] did the surgery at [facility], After the surgery labile  blood pressure and labile, blood glucose, she has been to pcp and multiple ENTs [facility]. Every day the nise gets louder like fluid behind the ear. Not a ringing. She does not think this is her ears she thinks there is something going on in her head. At night she can feel things running down her neck. Also some vision changes in the left eye and wakes up with  soreness left temple and left head. But the real issue is the noise, feeling like she is under water, can be pulsatile in quality, thought she was having a stroke and aneurysm. TMs pearly, no pain on palpation of ears or TMJ . No lesions, no rashes. She has neck pain in the area of the cutaneous block. No other focal neurologic deficits, associated symptoms, inciting events or modifiable factors.   Reviewed notes, labs and imaging from outside physicians, which showed:  I reviewed [name]'s notes: She has been to ENT at [facility] and [place] and told the problem is hearing loss. Patient asked for neuro. Unfortunately tinnitus is almost always due to hearing loss. I spoke with an audiologist the other day for another patient who told me 85% tinnitus is hearing loss and 85% of those people will get better with hearing aids. Happy to image brain to ensure no other neurologic cause which would be rare such as head injuries, tumor-related disorders, blood vessel problems.   10/14/2022 TSH nml, hgba1c 04-08-2022 hgba1c 7.6 but patient reports latest 8.7   Review of Systems: Patient complains of symptoms per HPI as well as the following symptoms left ear pain. Pertinent negatives and positives per HPI. All others negative.   Social History   Socioeconomic History   Marital status: Widowed    Spouse name: Not on file   Number of children: 2   Years of education: 1  yr of college   Highest education level: Not on file  Occupational History   Not on file  Tobacco Use   Smoking status: Never    Passive exposure: Past   Smokeless tobacco: Never  Vaping Use   Vaping status: Never Used  Substance and Sexual Activity   Alcohol use: No   Drug use: No   Sexual activity: Not Currently    Partners: Male  Other Topics Concern   Not on file  Social History Narrative   Lives at home alone   Right handed   Caffeine: coffee 1 cup/day   Works part time    Social Drivers of Research scientist (physical sciences) Strain: Not on file  Food Insecurity: Low Risk  (09/21/2023)   Received from [facility]   Hunger Vital Sign    Worried About Running Out of Food in the Last Year: Never true    Ran Out of Food in the Last Year: Never true  Transportation Needs: No Transportation Needs (09/21/2023)   Received from [facility]    In the past 12 months, has lack of reliable transportation kept you from medical appointments, meetings, work or from getting things needed for daily living? : No  Physical Activity: Not on file  Stress: Not on file  Social Connections: Unknown (12/18/2021)   [facility] from [facility], [facility]   Social Network    Social Network: Not on file  Intimate Partner Violence: Unknown (11/09/2021)   [facility], [facility]   HITS    Physically Hurt: Not on file  Insult or Talk Down To: Not on file    Threaten Physical Harm: Not on file    Scream or Curse: Not on file    Family History  Problem Relation Age of Onset   Diabetes Mother    Prostate cancer Father    Stomach cancer Maternal Aunt    Breast cancer Maternal Aunt    Pancreatic cancer Maternal Aunt    Pancreatic cancer Maternal Uncle    Pancreatic cancer Maternal Uncle    Lung cancer Maternal Grandmother    Heart attack Maternal Grandfather    Migraines Neg Hx     Past Medical History:  Diagnosis Date   Allergic rhinitis    Arthritis    knees   Breast cancer of upper-outer quadrant of left female breast (HCC) 09/12/2014   DDD (degenerative disc disease), cervical    Depression    GERD (gastroesophageal reflux disease)    History of anemia    still takes iron supplement   Hypertension    under control with med., has been on med. since 1990s   Insulin  dependent diabetes mellitus    Obesity    Personal history of chemotherapy    Personal history of radiation therapy    Recurrent upper respiratory infection (URI)    Seasonal asthma    no current med.     Patient Active Problem List   Diagnosis Date Noted   Racing heart beat 07/01/2023   Acute hearing loss, left 12/07/2022   Left temporal headache 12/07/2022   Ear pain, left 12/07/2022   Dyslipidemia, goal LDL below 100 08/29/2015   Metabolic syndrome 08/29/2015   Precordial pain 05/28/2015   Right carotid bruit 05/28/2015   Intermittent claudication (HCC) 05/28/2015   Hypoxia 05/20/2015   Hypotension 05/20/2015   Chest pain 05/20/2015   Hypokalemia 05/20/2015   Prolonged QT interval 05/20/2015   Syncope 05/20/2015   Chest pain at rest 05/20/2015   Obesity, Class II, BMI 35-39.9    Medication adverse effect    Thalassemia 03/05/2015   Seasonal allergies 02/08/2015   Cancer associated pain 02/08/2015   Glaucoma 12/31/2014   Breast cancer of upper-outer quadrant of left female breast (HCC) 09/12/2014   Abscess of abdominal wall s/p I&D 7/30- & 03/08/2014 03/08/2014   Cellulitis 03/08/2014   Diabetes mellitus, type II, insulin  dependent (HCC) 03/08/2014   Essential hypertension, benign 03/08/2014   GERD (gastroesophageal reflux disease) 03/08/2014   Anemia in neoplastic disease 03/08/2014    Past Surgical History:  Procedure Laterality Date   ABDOMINAL HYSTERECTOMY  1995   complete   APPENDECTOMY  1980   BREAST BIOPSY Left 10/15/2015   BREAST BIOPSY Left 09/26/2015   BREAST BIOPSY Left 09/09/2014   BREAST LUMPECTOMY Left 10/01/2014   CARPAL TUNNEL RELEASE Right 08/06/2004   CESAREAN SECTION     x 2   CHOLECYSTECTOMY  1980   COLONOSCOPY     exploratory surgery of abdomen  11/1978   IRRIGATION AND DEBRIDEMENT ABSCESS N/A 03/08/2014   Procedure: IRRIGATION AND DEBRIDEMENT ABDOMINAL WALL ABSCESS;  Surgeon: Eddye Goodie, Dillon;  Location: [facility];  Service: General;  Laterality: N/A;   KNEE ARTHROSCOPY Bilateral    NM MYOVIEW  LTD  06/2015   LOW RISK. HTN response to exercise. No EKG changes. Small, partially reversible apical defect (probably Breast Attenuation, but CRO  apical Ischemia).  EF 63% with no RWMA   polyp removed from L breast     PORTACATH PLACEMENT Right 10/21/2014   Procedure: INSERTION  PORT-A-CATH;  Surgeon: Enid Harry, Dillon;  Location: [facility];  Service: General;  Laterality: Right;   RADIOACTIVE SEED GUIDED PARTIAL MASTECTOMY WITH AXILLARY SENTINEL LYMPH NODE BIOPSY Left 10/01/2014   Procedure: RADIOACTIVE SEED GUIDED LEFT BREAST LUMPECTOMY WITH LEFT  AXILLARY SENTINEL LYMPH NODE BIOPSY;  Surgeon: Enid Harry, Dillon;  Location: [facility];  Service: General;  Laterality: Left;   RE-EXCISION OF BREAST LUMPECTOMY Left 10/21/2014   Procedure: RE-EXCISION OF BREAST CANCER, POSTERIOR MARGINS;  Surgeon: Enid Harry, Dillon;  Location: [facility];  Service: General;  Laterality: Left;   TONSILLECTOMY  1980   TRIGGER FINGER RELEASE Right 08/06/2004   middle finger   UPPER GI ENDOSCOPY      Current Outpatient Medications  Medication Sig Dispense Refill   albuterol  (VENTOLIN  HFA) 108 (90 Base) MCG/ACT inhaler Inhale 1-2 puffs into the lungs every 4 (four) hours as needed. 6.7 g 3   azelastine  (ASTELIN ) 0.1 % nasal spray Place 2 sprays into both nostrils 2 (two) times daily. Use in each nostril as directed 30 mL 12   cetirizine (ZYRTEC) 10 MG tablet Take 10 mg by mouth daily.     chlorpheniramine-HYDROcodone  (TUSSIONEX PENNKINETIC ER) 10-8 MG/5ML SUER Take 5 mLs by mouth every 12 (twelve) hours as needed for cough. 50 mL 0   cholecalciferol (VITAMIN D3) 25 MCG (1000 UNIT) tablet Take 1,000 Units by mouth daily.     Continuous Glucose Sensor (DEXCOM G7 SENSOR) MISC Use as directed to check blood glucose. Change every 10 days. 3 each 11   Continuous Glucose Sensor (DEXCOM G7 SENSOR) MISC Use as directed to test blood glucose. Replace every 10 days. 3 each 5   DULoxetine  (CYMBALTA ) 60 MG capsule Take 60 mg by mouth daily.     fluticasone  (FLONASE ) 50 MCG/ACT nasal spray Place 2 sprays into both  nostrils daily. 1 g 12   GNP GARLIC EXTRACT PO Take 1,000 mg by mouth daily.     insulin  aspart (NOVOLOG  FLEXPEN) 100 UNIT/ML FlexPen Inject 12 Units into the skin with breakfast, with lunch, and with evening meal. Before carb meals.     insulin  degludec (TRESIBA  FLEXTOUCH) 100 UNIT/ML FlexTouch Pen Inject 30 Units into the skin daily (Increase as directed. Max daily dose 50 units daily) 15 mL 3   losartan  (COZAAR ) 25 MG tablet Take 1 tablet (25 mg total) by mouth daily. PLEASE SCHEDULE OFFICE VISIT FOR REFILLS OR GET FROM PCP 15 tablet 0   lovastatin (MEVACOR) 40 MG tablet Take 40 mg by mouth daily.     Multiple Vitamins-Minerals (CENTRUM WOMEN PO) Take 1 each by mouth daily.     pantoprazole  (PROTONIX ) 40 MG tablet Take 1 tablet (40 mg total) by mouth 2 (two) times daily. 180 tablet 0   pregabalin (LYRICA) 25 MG capsule Start Lyrica 25mg (1 cap) at bedtime and in 2 weeks, if no side effects, can increase to 25mg (1 cap) twice a day. 60 capsule 6   tirzepatide  (MOUNJARO ) 7.5 MG/0.5ML Pen Inject 7.5 mg into the skin once a week. 2 mL 3   albuterol  (VENTOLIN  HFA) 108 (90 Base) MCG/ACT inhaler 1-2 PUFFS AS NEEDED EVERY 4 HRS INHALATION     Biotin 5000 MCG CAPS Take 1 capsule by mouth daily. (Patient not taking: Reported on 09/01/2023)     CINNAMON PO Take 4,000 mg by mouth daily. (Patient not taking: Reported on 09/01/2023)     cyanocobalamin (VITAMIN B12) 1000 MCG tablet Take 1,000 mcg by mouth daily.  folic acid (FOLVITE) 400 MCG tablet Take 400 mcg by mouth daily.     hyoscyamine  (ANASPAZ ) 0.125 MG TBDP disintergrating tablet Place 1 tablet (0.125 mg total) under the tongue every 4 (four) hours as needed. 120 tablet 5   Omega-3 Fatty Acids (FISH OIL PO) Take 3 each by mouth daily.  (Patient not taking: Reported on 09/01/2023)     pantoprazole  (PROTONIX ) 40 MG tablet Take 40 mg by mouth daily.     tirzepatide  (MOUNJARO ) 2.5 MG/0.5ML Pen Inject 2.5 mg under the skin once weekly for 28 days. (Patient  not taking: Reported on 09/01/2023) 2 mL 0   tirzepatide  (MOUNJARO ) 5 MG/0.5ML Pen Inject 5 mg into the skin once a week. (Patient not taking: Reported on 09/01/2023) 2 mL 1   tirzepatide  (MOUNJARO ) 7.5 MG/0.5ML Pen Inject 7.5 mg into the skin once a week. 2 mL 0   tirzepatide  (MOUNJARO ) 7.5 MG/0.5ML Pen Inject 7.5 mg into the skin once a week. 2 mL 3   No current facility-administered medications for this visit.    Allergies as of 01/16/2024 - Review Complete 01/16/2024  Allergen Reaction Noted   Crestor [rosuvastatin]  11/19/2019   Humalog [insulin  lispro]  11/19/2019   Lipitor [atorvastatin ]  11/19/2019   Macrobid [nitrofurantoin monohyd macro] Hives and Itching 05/20/2015   Biaxin [clarithromycin] Other (See Comments) 06/20/2012    Vitals: BP 114/72   Pulse 76   Ht 5\' 3"  (1.6 m)   Wt 180 lb (81.6 kg)   BMI 31.89 kg/m  Last Weight:  Wt Readings from Last 1 Encounters:  01/16/24 180 lb (81.6 kg)   Last Height:   Ht Readings from Last 1 Encounters:  01/16/24 5\' 3"  (1.6 m)     Physical exam: Exam: Gen: NAD, conversant, well nourised, obese, well groomed                     CV: RRR, no MRG. No Carotid Bruits. No peripheral edema, warm, nontender Eyes: Conjunctivae clear without exudates or hemorrhage Ears: TMs pearly, + pain on palpation of ears and when rotating neck  Neuro: Detailed Neurologic Exam  Speech:    Speech is normal; fluent and spontaneous with normal comprehension.  Cognition:    The patient is oriented to person, place, and time;     recent and remote memory intact;     language fluent;     normal attention, concentration,     fund of knowledge Cranial Nerves:    The pupils are equal, round, and reactive to light. Attempted fundoscopy, pupils too small. Visual fields are full to finger confrontation. Extraocular movements are intact. Trigeminal sensation is intact and the muscles of mastication are normal. The face is symmetric. The palate elevates in  the midline. Hearing decreased left ear. Voice is normal. Shoulder shrug is normal. The tongue has normal motion without fasciculations.   Coordination: No dysmetria  Gait: Antalgic slightly  Motor Observation:    No asymmetry, no atrophy, and no involuntary movements noted. Tone:    Normal muscle tone.    Posture:    Posture is normal. normal erect    Strength: proximal weakness left arm      Sensation: intact to LT     Reflex Exam:  DTR's:    Deep tendon reflexes in the upper and lower extremities are symmetrical bilaterally, normal in the uppers   Assessment/Plan:  Patient with cervicalgia and neck pain.   She has had chronic neck pain for greater  than a year, been under the care of primary care and neurology, failed conservative measures, she has pain radiating into the left arm to the fingers, proximal weakness, secondarily referred pain to head, we will perform an MRI of the cervical spine to evaluate for cervical radiculopathy or causes for cervicalgia possibly for surgical work other interventions such as epidural steroid injections or medial branch blocks depending on findings. Start Lyrica. Future: further increase Lyrica or can try gabapentin ; Start Lyrica 25mg  at bedtime and in 2 weeks, if no side effects, can increase to 25mg  twice a day. Then future can increase as needed.  Blood work 2 labs to evaluate for GCA which is less likely  Orders Placed This Encounter  Procedures   MR CERVICAL SPINE WO CONTRAST   Sedimentation rate   C-reactive protein   Ambulatory referral to Physical Therapy   Meds ordered this encounter  Medications   pregabalin (LYRICA) 25 MG capsule    Sig: Start Lyrica 25mg (1 cap) at bedtime and in 2 weeks, if no side effects, can increase to 25mg (1 cap) twice a day.    Dispense:  60 capsule    Refill:  6    Physical Therapy: Cervical myofascial pain, chronic neck pain contributing to headache/cervicalgia. Please evaluate and treat including  dry needling, stretching, strengthening, manual therapy/massage, heating, TENS unit, exercising for scapular stabilization, pectoral stretching and rhomboid strengthening as clinically warranted as well as any other modality as recommended by evaluation.   Cc: Dominique Dillon,  Dominique Dillon  Aldona Amel, Dillon  [facility] [address] [place], [place] [address]  Phone [phone] Fax [phone]  I spent 45 minutes of face-to-face and non-face-to-face time with patient on the  1. Cervicalgia   2. Chronic left-sided headache   3. Left arm weakness   4. Cervical radiculopathy   5. Chronic neck pain   6. Blurry vision, left eye   7. Numbness and tingling in left arm     diagnosis.  This included previsit chart review, lab review, study review, order entry, electronic health record documentation, patient education on the different diagnostic and therapeutic options, counseling and coordination of care, risks and benefits of management, compliance, or risk factor reduction

## 2024-01-16 NOTE — Patient Instructions (Addendum)
 MRI cervical spine Physical Therapy for chronic neck pain, neck tightness: [address] is closer Start Lyrica 25mg  at bedtime and in 2 weeks, if no side effects, can increase to 25mg  twice a day. Then future can increase as needed.  Blood work 2 labs  Pregabalin Capsules What is this medication? PREGABALIN (pre GAB a lin) treats nerve pain. It may also be used to prevent and control seizures in people with epilepsy. It works by calming overactive nerves in your body. This medicine may be used for other purposes; ask your health care provider or pharmacist if you have questions. COMMON BRAND NAME(S): Lyrica What should I tell my care team before I take this medication? They need to know if you have any of these conditions: Heart failure Kidney disease Lung disease Substance use disorder Suicidal thoughts, plans or attempt by you or a family member An unusual or allergic reaction to pregabalin, other medications, foods, dyes, or preservatives Pregnant or trying to get pregnant Breast-feeding How should I use this medication? Take this medication by mouth with water. Take it as directed on the prescription label at the same time every day. You can take it with or without food. If it upsets your stomach, take it with food. Keep taking it unless your care team tells you to stop. A special MedGuide will be given to you by the pharmacist with each prescription and refill. Be sure to read this information carefully each time. Talk to your care team about the use of this medication in children. While it may be prescribed for children as young as 1 month for selected conditions, precautions do apply. Overdosage: If you think you have taken too much of this medicine contact a poison control center or emergency room at once. NOTE: This medicine is only for you. Do not share this medicine with others. What if I miss a dose? If you miss a dose, take it as soon as you can. If it is almost time for your  next dose, take only that dose. Do not take double or extra doses. What may interact with this medication? This medication may interact with the following: Alcohol Antihistamines for allergy , cough, and cold Certain medications for anxiety or sleep Certain medications for blood pressure, heart disease Certain medications for depression like amitriptyline, fluoxetine, sertraline Certain medications for diabetes, like pioglitazone, rosiglitazone Certain medications for seizures like phenobarbital, primidone General anesthetics like halothane, isoflurane, methoxyflurane, propofol  Medications that relax muscles for surgery Opioid medications for pain Phenothiazines like chlorpromazine, mesoridazine, prochlorperazine , thioridazine This list may not describe all possible interactions. Give your health care provider a list of all the medicines, herbs, non-prescription drugs, or dietary supplements you use. Also tell them if you smoke, drink alcohol, or use illegal drugs. Some items may interact with your medicine. What should I watch for while using this medication? Visit your care team for regular checks on your progress. Tell your care team if your symptoms do not start to get better or if they get worse. Do not suddenly stop taking this medication. You may develop a severe reaction. Your care team will tell you how much medication to take. If your care team wants you to stop the medication, the dose may be slowly lowered over time to avoid any side effects. This medication may affect your coordination, reaction time, or judgment. Do not drive or operate machinery until you know how this medication affects you. Sit up or stand slowly to reduce the risk of dizzy or  fainting spells. Drinking alcohol with this medication can increase the risk of these side effects. If you or your family notice any changes in your behavior, such as new or worsening depression, thoughts of harming yourself, anxiety, other  unusual or disturbing thoughts, or memory loss, call your care team right away. Wear a medical ID bracelet or chain if you are taking this medication for seizures. Carry a card that describes your condition. List the medications and doses you take on the card. This medication may make it more difficult to father a child. Talk to your care team if you are concerned about your fertility. What side effects may I notice from receiving this medication? Side effects that you should report to your care team as soon as possible: Allergic reactions or angioedema--skin rash, itching, hives, swelling of the face, eyes, lips, tongue, arms, or legs, trouble swallowing or breathing Blurry vision Thoughts of suicide or self-harm, worsening mood, feelings of depression Trouble breathing Side effects that usually do not require medical attention (report to your care team if they continue or are bothersome): Dizziness Drowsiness Dry mouth Nausea Swelling of the ankles, feet, hands Vomiting Weight gain This list may not describe all possible side effects. Call your doctor for medical advice about side effects. You may report side effects to FDA at 1-800-FDA-1088. Where should I keep my medication? Keep out of the reach of children and pets. This medication can be abused. Keep it in a safe place to protect it from theft. Do not share it with anyone. It is only for you. Selling or giving away this medication is dangerous and against the law. Store at [employer] [address] (77 degrees F). Get rid of any unused medication after the expiration date. This medication may cause harm and death if it is taken by other adults, children, or pets. It is important to get rid of the medication as soon as you no longer need it, or it is expired. You can do this in two ways: Take the medication to a medication take-back program. Check with your pharmacy or law enforcement to find a location. If you cannot return the medication, check the  label or package insert to see if the medication should be thrown out in the garbage or flushed down the toilet. If you are not sure, ask your care team. If it is safe to put it in the trash, take the medication out of the container. Mix the medication with cat litter, dirt, coffee grounds, or other unwanted substance. Seal the mixture in a bag or container. Put it in the trash. NOTE: This sheet is a summary. It may not cover all possible information. If you have questions about this medicine, talk to your doctor, pharmacist, or health care provider.  2024 Elsevier/Gold Standard (2021-04-28 00:00:00)

## 2024-01-17 ENCOUNTER — Ambulatory Visit: Payer: Self-pay | Admitting: Neurology

## 2024-01-17 ENCOUNTER — Telehealth: Payer: Self-pay | Admitting: Neurology

## 2024-01-17 LAB — SEDIMENTATION RATE: Sed Rate: 17 mm/h (ref 0–40)

## 2024-01-17 LAB — C-REACTIVE PROTEIN: CRP: 4 mg/L (ref 0–10)

## 2024-01-17 NOTE — Telephone Encounter (Signed)
 Dominique Dillon: [id number] exp. 01/17/24-03/17/24 sent to GI [phone]

## 2024-01-18 ENCOUNTER — Ambulatory Visit: Attending: Neurology | Admitting: Physical Therapy

## 2024-01-18 DIAGNOSIS — G8929 Other chronic pain: Secondary | ICD-10-CM | POA: Diagnosis not present

## 2024-01-18 DIAGNOSIS — M5412 Radiculopathy, cervical region: Secondary | ICD-10-CM | POA: Insufficient documentation

## 2024-01-18 DIAGNOSIS — M6281 Muscle weakness (generalized): Secondary | ICD-10-CM | POA: Insufficient documentation

## 2024-01-18 DIAGNOSIS — M542 Cervicalgia: Secondary | ICD-10-CM | POA: Diagnosis not present

## 2024-01-18 NOTE — Therapy (Signed)
 OUTPATIENT PHYSICAL THERAPY CERVICAL EVALUATION   Patient Name: Dominique Dillon MRN: [id number] DOB:09/04/1955, 68 y.o., female Today's Date: 01/18/2024  END OF SESSION:  PT End of Session - 01/18/24 1412     Visit Number 1    Number of Visits 16    Date for PT Re-Evaluation 03/14/24    Authorization Type [employer] M[employer]    PT Start Time 1415    PT Stop Time 1500    PT Time Calculation (min) 45 min    Activity Tolerance Patient tolerated treatment well    Behavior During Therapy WFL for tasks assessed/performed             Past Medical History:  Diagnosis Date   Allergic rhinitis    Arthritis    knees   Breast cancer of upper-outer quadrant of left female breast (HCC) 09/12/2014   DDD (degenerative disc disease), cervical    Depression    GERD (gastroesophageal reflux disease)    History of anemia    still takes iron supplement   Hypertension    under control with med., has been on med. since 1990s   Insulin  dependent diabetes mellitus    Obesity    Personal history of chemotherapy    Personal history of radiation therapy    Recurrent upper respiratory infection (URI)    Seasonal asthma    no current med.   Past Surgical History:  Procedure Laterality Date   ABDOMINAL HYSTERECTOMY  1995   complete   APPENDECTOMY  1980   BREAST BIOPSY Left 10/15/2015   BREAST BIOPSY Left 09/26/2015   BREAST BIOPSY Left 09/09/2014   BREAST LUMPECTOMY Left 10/01/2014   CARPAL TUNNEL RELEASE Right 08/06/2004   CESAREAN SECTION     x 2   CHOLECYSTECTOMY  1980   COLONOSCOPY     exploratory surgery of abdomen  11/1978   IRRIGATION AND DEBRIDEMENT ABSCESS N/A 03/08/2014   Procedure: IRRIGATION AND DEBRIDEMENT ABDOMINAL WALL ABSCESS;  Surgeon: Eddye Goodie, MD;  Location: [facility];  Service: General;  Laterality: N/A;   KNEE ARTHROSCOPY Bilateral    NM MYOVIEW  LTD  06/2015   LOW RISK. HTN response to exercise. No EKG changes. Small, partially reversible apical defect (probably Breast  Attenuation, but CRO apical Ischemia).  EF 63% with no RWMA   polyp removed from L breast     PORTACATH PLACEMENT Right 10/21/2014   Procedure: INSERTION PORT-A-CATH;  Surgeon: Enid Harry, MD;  Location: [facility];  Service: General;  Laterality: Right;   RADIOACTIVE SEED GUIDED PARTIAL MASTECTOMY WITH AXILLARY SENTINEL LYMPH NODE BIOPSY Left 10/01/2014   Procedure: RADIOACTIVE SEED GUIDED LEFT BREAST LUMPECTOMY WITH LEFT  AXILLARY SENTINEL LYMPH NODE BIOPSY;  Surgeon: Enid Harry, MD;  Location: [facility];  Service: General;  Laterality: Left;   RE-EXCISION OF BREAST LUMPECTOMY Left 10/21/2014   Procedure: RE-EXCISION OF BREAST CANCER, POSTERIOR MARGINS;  Surgeon: Enid Harry, MD;  Location:  SURGERY CENTER;  Service: General;  Laterality: Left;   TONSILLECTOMY  1980   TRIGGER FINGER RELEASE Right 08/06/2004   middle finger   UPPER GI ENDOSCOPY     Patient Active Problem List   Diagnosis Date Noted   Racing heart beat 07/01/2023   Acute hearing loss, left 12/07/2022   Left temporal headache 12/07/2022   Ear pain, left 12/07/2022   Dyslipidemia, goal LDL below 100 08/29/2015   Metabolic syndrome 08/29/2015   Precordial pain 05/28/2015   Right carotid bruit 05/28/2015  Intermittent claudication (HCC) 05/28/2015   Hypoxia 05/20/2015   Hypotension 05/20/2015   Chest pain 05/20/2015   Hypokalemia 05/20/2015   Prolonged QT interval 05/20/2015   Syncope 05/20/2015   Chest pain at rest 05/20/2015   Obesity, Class II, BMI 35-39.9    Medication adverse effect    Thalassemia 03/05/2015   Seasonal allergies 02/08/2015   Cancer associated pain 02/08/2015   Glaucoma 12/31/2014   Breast cancer of upper-outer quadrant of left female breast (HCC) 09/12/2014   Abscess of abdominal wall s/p I&D 7/30- & 03/08/2014 03/08/2014   Cellulitis 03/08/2014   Diabetes mellitus, type II, insulin  dependent (HCC) 03/08/2014   Essential hypertension,  benign 03/08/2014   GERD (gastroesophageal reflux disease) 03/08/2014   Anemia in neoplastic disease 03/08/2014    PCP: Veta Govern MD [facility], [place])   REFERRING PROVIDER: Glory Larsen, MD  REFERRING DIAG: [phone] (ICD-10-CM) - Chronic neck pain M54.2 (ICD-10-CM) - Cervicalgia  THERAPY DIAG:  Radiculopathy, cervical region  Muscle weakness (generalized)  Rationale for Evaluation and Treatment: Rehabilitation  ONSET DATE: 18 mos   SUBJECTIVE:                                                                                                                                                                                                         SUBJECTIVE STATEMENT: Pt with pain in her neck and left upper extremity.  She reports frequent headaches, difficulty using her arms especially the left.  When she had her right shoulder surgery she had a nerve block and feels like ever since then her body has not been right.  She reports left upper extremity weakness and it feels like it does not belong to her.  She has numbness and tingling in the left upper extremity much of the time.  She reports she had a concussion when she initially injured her right shoulder back in 2022.  She has an MRI ordered by Dr. Tresia Fruit. She was limited in work but now she is done for the summer.  She does endorse being off balance but does not use an AD.    Hand dominance: Right  PERTINENT HISTORY:  Rt RCR Dr. Janeth Medicus  Dec. 19, 2023 Osteoarthritis, right knee, hip  Dr. Tresia Fruit note: New consult for recurrent headache. She was seen in 11/2022 for tinnitus and mri brain and mra of the head were unremarkable. Has had esr/crp checked in the past for headaches. She has had headaches since she fell back and hit her head December 19th 2023 she has neck pain, more  neck pain, radiates down the neck, shoots up from the back of the head to the eye. Headache starts on the left side(points to the parietal  area) and radiates to the left forehead and then back down the neck. A lot of neck tightness. She has a lot of neck muscle tightness and that is contributory, can't describe it just hurts. Radiates down the left arm to right elbow an dthe hand goes to sleep. She was on gabapentin  in the past for her bad legs, she is going to a podiatrist. She states she can't see as clear out of her left eye. She has jaw list. I don;t think she needs steroids as this is chronic ongoing since 2023 and had esr/crp in the past but will check again. In pain 24x7. Did well on gabapentin  but 3x a day is something that is diffult start lyrica. Pain every day, terrible, chronic pain 5-6/10 in the head and in the leg. Ongoing 2 years, been under the care of physicians, failed medical and conservative management.      PAIN:  Are you having pain? Yes: NPRS scale: 6/10 Pain location: neck  Pain description: tight  Aggravating factors: using it  Relieving factors: rest, does not take Lyrica yet.   PRECAUTIONS: None  RED FLAGS: None     WEIGHT BEARING RESTRICTIONS: No  FALLS:  Has patient fallen in last 6 months? NoDoes feel off balance.   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   PATIENT GOALS: Pt would like to be able to feel better.  Get my mind right.   NEXT MD VISIT: As needed   OBJECTIVE:  Note: Objective measures were completed at Evaluation unless otherwise noted.  DIAGNOSTIC FINDINGS:  MRI injection  PATIENT SURVEYS:  NDI:  NECK DISABILITY INDEX  Date: 01/18/24 Score  Pain intensity 2 = The pain is moderate at the moment  2. Personal care (washing, dressing, etc.) 0 = I can look after myself normally without causing  extra pain  3. Lifting 3 = Pain prevents me from lifting heavy weights but I can manage light to medium   weights if they are conveniently positioned  4. Reading 2 =  I can read as much as I want with moderate pain in my neck  5. Headaches 3 = I have moderate headaches, which come frequently  6. Concentration 0 =  I can concentrate fully when I want to with no difficulty  7. Work 4 = I can hardly do any work at all  8. Driving 1 =  I can drive my car as long as I want with slight pain in my neck  9. Sleeping 1 = My sleep is slightly disturbed (less than 1 hr sleepless)  10. Recreation 3 = I am able to engage in a few of my usual recreation activities because of pain in   my neck  Total 20/50   Minimum Detectable Change (90% confidence): 5 points or 10% points  COGNITION: Overall cognitive status: Within functional limits for tasks assessed  SENSATION: L hand numb and tingling   POSTURE: sloped shoulders , forward head   PALPATION: Pain and soreness to palpation to bilateral posterior cervical muscle.  Manual traction improved tingling  in left upper extremity   CERVICAL ROM:   Active ROM A/PROM (deg) eval  Flexion 48  Extension 50  Right lateral flexion 40 pain L   Left lateral flexion 35 pain R   Right rotation 55  Left rotation 55   (Blank rows = not tested)  UPPER EXTREMITY ROM:  Supine PROM all WNL with pain in L UE end range ABD/ER and flexion   Active ROM Right eval Left eval  Shoulder flexion AROM against gravity 120  AROM against gravity 105  Shoulder extension    Shoulder abduction    Shoulder adduction    Shoulder extension    Shoulder internal rotation    Shoulder external rotation    Elbow flexion    Elbow extension    Wrist flexion    Wrist extension    Wrist ulnar deviation    Wrist radial deviation    Wrist pronation    Wrist supination     (Blank rows = not tested)  UPPER EXTREMITY MMT:  MMT Right eval Left eval  Shoulder flexion 4- 3-   Shoulder extension    Shoulder abduction 4 3-  Shoulder adduction    Shoulder extension    Shoulder internal rotation 5 4  Shoulder external rotation 4+ 4  Middle trapezius    Lower trapezius    Elbow flexion    Elbow extension    Wrist flexion    Wrist extension    Wrist ulnar deviation    Wrist radial deviation    Wrist pronation    Wrist supination    Grip strength     (Blank rows = not tested)  CERVICAL SPECIAL TESTS:  Less N/T with traction   FUNCTIONAL TESTS:  NT   TREATMENT DATE: 01/18/24 Patient was evaluated for cervical pain with radiating symptoms into the left upper extremity Demonstrated home exercise program including chin tuck, scapular retraction, cervical flexion and extension using a towel, upper trap stretches with towel draped over shoulder to anchor shoulder down. Recommended she use moist heat and prescribed medication for pain Discussed trigger point dry needling and how it is now cash based.  She would like to proceed with the MRI first before doing trigger point dry needling.                                                                                                                                 PATIENT EDUCATION:  Education details: see above  Person educated: Patient Education method: Programmer, multimedia, Demonstration, Verbal cues, and Handouts Education comprehension: verbalized understanding, returned demonstration, and needs further education  HOME EXERCISE PROGRAM: Access Code: [email or web address] URL: [email or web address] Date: 01/18/2024 Prepared by: Marci Setter  Exercises - Supine Cervical Retraction with Towel  - 1 x daily - 7 x weekly - 2 sets - 10 reps - 5 hold - Seated Scapular Retraction  - 1 x daily - 7 x weekly - 2 sets - 5  reps - 5 hold - Seated Neck Sidebending Stretch  - 1 x daily - 7 x weekly - 1 sets - 3 reps - 30 hold  ASSESSMENT:  CLINICAL IMPRESSION: Patient is a 67y.o. female who was seen today for physical  therapy evaluation and treatment for cervicalgia and headache.   OBJECTIVE IMPAIRMENTS: decreased balance, decreased mobility, decreased ROM, decreased strength, increased fascial restrictions, increased muscle spasms, impaired flexibility, impaired sensation, postural dysfunction, and pain.   ACTIVITY LIMITATIONS: carrying, lifting, sleeping, reach over head, hygiene/grooming, locomotion level, and caring for others  PARTICIPATION LIMITATIONS: meal prep, cleaning, laundry, community activity, and occupation  PERSONAL FACTORS: Past/current experiences and 3+ comorbidities: History of breast cancer, right-sided shoulder rotator cuff repair: Other orthopedic issues are also affecting patient's functional outcome.   REHAB POTENTIAL: Good  CLINICAL DECISION MAKING: Evolving/moderate complexity  EVALUATION COMPLEXITY: Moderate   GOALS: Goals reviewed with patient? Yes  SHORT TERM GOALS: Target date: 02/15/2024    Patient will be able to show independence for initial HEP to include posture, core and hip strength and stability.  Baseline:  Goal status: INITIAL  2.  Patient will be able to demonstrate proper sitting posture and upper extremity support in order to reduce pain at rest Baseline:  Goal status: INITIAL  3.  Patient will complete balance assessment and set goal Baseline:  Goal status: INITIAL   LONG TERM GOALS: Target date: 03/14/2024    Pt will be able to show I with final HEP upon discharge for posture, lifting and UE strength     Baseline:  Goal status: INITIAL  2.  Patient will improve NDI score by 8.5 points Baseline: 20/50 Goal status: INITIAL  3.  Patient will be able to resume light home tasks without increased pain Baseline:  Goal status: INITIAL  4.  Patient will be able to care for her small grandkids without increased pain in her shoulder and arm Baseline:  Goal status: INITIAL  5.  Balance goal to be assessed Baseline:  Goal status:  INITIAL  PLAN:  PT FREQUENCY: 2x/week  PT DURATION: 8 weeks  PLANNED INTERVENTIONS: 97164- PT Re-evaluation, 97750- Physical Performance Testing, 97110-Therapeutic exercises, 97530- Therapeutic activity, 97112- Neuromuscular re-education, 97535- Self Care, 81191- Manual therapy, [phone]- Gait training, Patient/Family education, Balance training, Joint mobilization, Cryotherapy, and Moist heat  PLAN FOR NEXT SESSION: Check home exercise program, manual therapy, symptom management, AROM bilateral shoulders    Hamlet Lasecki, PT 01/18/2024, 8:58 PM  Marci Setter, PT 01/18/24 8:58 PM Phone: [phone] Fax: [phone]

## 2024-01-22 NOTE — Therapy (Addendum)
 OUTPATIENT PHYSICAL THERAPY CERVICAL TREATMENT   Patient Name: Dominique Dillon MRN: [id number] DOB:04-03-1956, 68 y.o., female Today's Date: 01/24/2024  END OF SESSION:  PT End of Session - 01/24/24 1430     Visit Number 2    Number of Visits 16    Date for PT Re-Evaluation 03/14/24    Authorization Type [employer] M[employer]    PT Start Time 1420    PT Stop Time 1500    PT Time Calculation (min) 40 min    Activity Tolerance Patient tolerated treatment well    Behavior During Therapy WFL for tasks assessed/performed           Past Medical History:  Diagnosis Date   Allergic rhinitis    Arthritis    knees   Breast cancer of upper-outer quadrant of left female breast (HCC) 09/12/2014   DDD (degenerative disc disease), cervical    Depression    GERD (gastroesophageal reflux disease)    History of anemia    still takes iron supplement   Hypertension    under control with med., has been on med. since 1990s   Insulin  dependent diabetes mellitus    Obesity    Personal history of chemotherapy    Personal history of radiation therapy    Recurrent upper respiratory infection (URI)    Seasonal asthma    no current med.   Past Surgical History:  Procedure Laterality Date   ABDOMINAL HYSTERECTOMY  1995   complete   APPENDECTOMY  1980   BREAST BIOPSY Left 10/15/2015   BREAST BIOPSY Left 09/26/2015   BREAST BIOPSY Left 09/09/2014   BREAST LUMPECTOMY Left 10/01/2014   CARPAL TUNNEL RELEASE Right 08/06/2004   CESAREAN SECTION     x 2   CHOLECYSTECTOMY  1980   COLONOSCOPY     exploratory surgery of abdomen  11/1978   IRRIGATION AND DEBRIDEMENT ABSCESS N/A 03/08/2014   Procedure: IRRIGATION AND DEBRIDEMENT ABDOMINAL WALL ABSCESS;  Surgeon: Eddye Goodie, MD;  Location: [facility];  Service: General;  Laterality: N/A;   KNEE ARTHROSCOPY Bilateral    NM MYOVIEW  LTD  06/2015   LOW RISK. HTN response to exercise. No EKG changes. Small, partially reversible apical defect (probably Breast  Attenuation, but CRO apical Ischemia).  EF 63% with no RWMA   polyp removed from L breast     PORTACATH PLACEMENT Right 10/21/2014   Procedure: INSERTION PORT-A-CATH;  Surgeon: Enid Harry, MD;  Location: [facility];  Service: General;  Laterality: Right;   RADIOACTIVE SEED GUIDED PARTIAL MASTECTOMY WITH AXILLARY SENTINEL LYMPH NODE BIOPSY Left 10/01/2014   Procedure: RADIOACTIVE SEED GUIDED LEFT BREAST LUMPECTOMY WITH LEFT  AXILLARY SENTINEL LYMPH NODE BIOPSY;  Surgeon: Enid Harry, MD;  Location: [facility];  Service: General;  Laterality: Left;   RE-EXCISION OF BREAST LUMPECTOMY Left 10/21/2014   Procedure: RE-EXCISION OF BREAST CANCER, POSTERIOR MARGINS;  Surgeon: Enid Harry, MD;  Location: [facility];  Service: General;  Laterality: Left;   TONSILLECTOMY  1980   TRIGGER FINGER RELEASE Right 08/06/2004   middle finger   UPPER GI ENDOSCOPY     Patient Active Problem List   Diagnosis Date Noted   Racing heart beat 07/01/2023   Acute hearing loss, left 12/07/2022   Left temporal headache 12/07/2022   Ear pain, left 12/07/2022   Dyslipidemia, goal LDL below 100 08/29/2015   Metabolic syndrome 08/29/2015   Precordial pain 05/28/2015   Right carotid bruit 05/28/2015   Intermittent claudication (  HCC) 05/28/2015   Hypoxia 05/20/2015   Hypotension 05/20/2015   Chest pain 05/20/2015   Hypokalemia 05/20/2015   Prolonged QT interval 05/20/2015   Syncope 05/20/2015   Chest pain at rest 05/20/2015   Obesity, Class II, BMI 35-39.9    Medication adverse effect    Thalassemia 03/05/2015   Seasonal allergies 02/08/2015   Cancer associated pain 02/08/2015   Glaucoma 12/31/2014   Breast cancer of upper-outer quadrant of left female breast (HCC) 09/12/2014   Abscess of abdominal wall s/p I&D 7/30- & 03/08/2014 03/08/2014   Cellulitis 03/08/2014   Diabetes mellitus, type II, insulin  dependent (HCC) 03/08/2014   Essential hypertension,  benign 03/08/2014   GERD (gastroesophageal reflux disease) 03/08/2014   Anemia in neoplastic disease 03/08/2014    PCP: Veta Govern MD [facility], [place])   REFERRING PROVIDER: Glory Larsen, MD  REFERRING DIAG: [phone] (ICD-10-CM) - Chronic neck pain M54.2 (ICD-10-CM) - Cervicalgia  THERAPY DIAG:  Radiculopathy, cervical region  Muscle weakness (generalized)  Rationale for Evaluation and Treatment: Rehabilitation  ONSET DATE: 18 mos   SUBJECTIVE:                                                                                                                                                                                                         SUBJECTIVE STATEMENT: Pt reports she she doing her HEP everyday and they seem to be helping.  EVAL: Pt with pain in her neck and left upper extremity.  She reports frequent headaches, difficulty using her arms especially the left.  When she had her right shoulder surgery she had a nerve block and feels like ever since then her body has not been right.  She reports left upper extremity weakness and it feels like it does not belong to her.  She has numbness and tingling in the left upper extremity much of the time.  She reports she had a concussion when she initially injured her right shoulder back in 2022.  She has an MRI ordered by Dr. Tresia Fruit. She was limited in work but now she is done for the summer.  She does endorse being off balance but does not use an AD.    Hand dominance: Right  PERTINENT HISTORY:  Rt RCR Dr. Janeth Medicus  Dec. 19, 2023 Osteoarthritis, right knee, hip  Dr. Tresia Fruit note: New consult for recurrent headache. She was seen in 11/2022 for tinnitus and mri brain and mra of the head were unremarkable. Has had esr/crp checked in the past for headaches. She has had headaches since she  fell back and hit her head December 19th 2023 she has neck pain, more neck pain, radiates down the neck, shoots up from the back  of the head to the eye. Headache starts on the left side(points to the parietal area) and radiates to the left forehead and then back down the neck. A lot of neck tightness. She has a lot of neck muscle tightness and that is contributory, can't describe it just hurts. Radiates down the left arm to right elbow an dthe hand goes to sleep. She was on gabapentin  in the past for her bad legs, she is going to a podiatrist. She states she can't see as clear out of her left eye. She has jaw list. I don;t think she needs steroids as this is chronic ongoing since 2023 and had esr/crp in the past but will check again. In pain 24x7. Did well on gabapentin  but 3x a day is something that is diffult start lyrica . Pain every day, terrible, chronic pain 5-6/10 in the head and in the leg. Ongoing 2 years, been under the care of physicians, failed medical and conservative management.      PAIN:  Are you having pain? Yes: NPRS scale: 6/10 Pain location: neck  Pain description: tight  Aggravating factors: using it  Relieving factors: rest, does not take Lyrica  yet.   PRECAUTIONS: None  RED FLAGS: None     WEIGHT BEARING RESTRICTIONS: No  FALLS:  Has patient fallen in last 6 months? NoDoes feel off balance.   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   PATIENT GOALS: Pt would like to be able to feel better.  Get my mind right.   NEXT MD VISIT: As needed   OBJECTIVE:  Note: Objective measures were completed at Evaluation unless otherwise noted.  DIAGNOSTIC FINDINGS:  MRI injection  MRI 6/176/25 IMPRESSION: This MRI of the cervical spine without contrast shows the following: 1, The spinal cord appears normal. 2.  At C3-C4  there are degenerative changes causing mild spinal stenosis and mild foraminal narrowing but no nerve root compression. 3.  At C4-C5, there are degenerative changes causing moderate spinal stenosis and moderate right greater than left foraminal narrowing.  Degenerative changes encroach upon the nerve roots without causing any definite nerve 4.  At C5-C6, there are degenerative changes causing mild spinal stenosis and moderately severe that could affect the C6 nerve roots. 5.  At C6-C7, there are degenerative changes causing mild spinal stenosis  PATIENT SURVEYS:  NDI:  NECK DISABILITY INDEX  Date: 01/18/24 Score  Pain intensity 2 = The pain is moderate at the moment  2. Personal care (washing, dressing, etc.) 0 = I can look after myself normally without causing extra pain  3. Lifting 3 = Pain prevents me from lifting heavy weights but I can manage light to medium   weights if they are conveniently positioned  4. Reading 2 =  I can read as much as I want with moderate pain in my neck  5. Headaches 3 = I have moderate headaches, which come frequently  6. Concentration 0 =  I can concentrate fully when I want to with no difficulty  7. Work 4 = I can hardly do any work at  all  8. Driving 1 =  I can drive my car as long as I want with slight pain in my neck  9. Sleeping 1 = My sleep is slightly disturbed (less than 1 hr sleepless)  10. Recreation 3 = I am able to engage in a few of my usual recreation activities because of pain in   my neck  Total 20/50   Minimum Detectable Change (90% confidence): 5 points or 10% points  COGNITION: Overall cognitive status: Within functional limits for tasks assessed  SENSATION: L hand numb and tingling   POSTURE: sloped shoulders , forward head   PALPATION: Pain and soreness to palpation to bilateral posterior cervical muscle.  Manual traction improved tingling in left upper extremity   CERVICAL ROM:   Active ROM A/PROM (deg) eval  Flexion 48   Extension 50  Right lateral flexion 40 pain L   Left lateral flexion 35 pain R   Right rotation 55  Left rotation 55   (Blank rows = not tested)  UPPER EXTREMITY ROM:  Supine PROM all WNL with pain in L UE end range ABD/ER and flexion   Active ROM Right eval Left eval  Shoulder flexion AROM against gravity 120  AROM against gravity 105  Shoulder extension    Shoulder abduction    Shoulder adduction    Shoulder extension    Shoulder internal rotation    Shoulder external rotation    Elbow flexion    Elbow extension    Wrist flexion    Wrist extension    Wrist ulnar deviation    Wrist radial deviation    Wrist pronation    Wrist supination     (Blank rows = not tested)  UPPER EXTREMITY MMT:  MMT Right eval Left eval  Shoulder flexion 4- 3-  Shoulder extension    Shoulder abduction 4 3-  Shoulder adduction    Shoulder extension    Shoulder internal rotation 5 4  Shoulder external rotation 4+ 4  Middle trapezius    Lower trapezius    Elbow flexion    Elbow extension    Wrist flexion    Wrist extension    Wrist ulnar deviation    Wrist radial deviation    Wrist pronation    Wrist supination    Grip strength     (Blank rows = not tested)  CERVICAL SPECIAL TESTS:  Less N/T with traction   FUNCTIONAL TESTS:  NT   TREATMENT DATE:  [facility] Adult PT Treatment:                                                DATE: 01/24/24 Therapeutic Exercise: Supine chin tuck x10 5 Seated scpular retractions  x10 5 Seated lateral side bending x3 30 each Standing chin tucks at wall x10 3 Standing shoulder rows 2x10 GTB Updated HEP Manual Therapy: STM to the upper traps and cervical paraspinals Suboccipital release Cervical traction  01/18/24 Patient was evaluated for cervical pain with radiating symptoms into the left upper extremity Demonstrated home exercise program including chin tuck, scapular retraction, cervical flexion and extension using a towel, upper trap  stretches with towel draped over shoulder to anchor shoulder down. Recommended she use moist heat and prescribed medication for pain Discussed trigger point dry needling and how it is now cash based.  She would like to  proceed with the MRI first before doing trigger point dry needling.                                                                                                                                 PATIENT EDUCATION:  Education details: see above  Person educated: Patient Education method: Programmer, multimedia, Demonstration, Verbal cues, and Handouts Education comprehension: verbalized understanding, returned demonstration, and needs further education  HOME EXERCISE PROGRAM: Access Code: [email or web address] URL: [email or web address] Date: 01/24/2024 Prepared by: Liborio Reeds  Exercises - Supine Cervical Retraction with Towel  - 1 x daily - 7 x weekly - 2 sets - 10 reps - 5 hold - Standing Cervical Retraction  - 6 x daily - 7 x weekly - 3 sets - 3 reps - 5 hold - Seated Scapular Retraction  - 1 x daily - 7 x weekly - 2 sets - 5 reps - 5 hold - Standing Shoulder Row with Anchored Resistance  - 1 x daily - 7 x weekly - 2 sets - 15 reps - 3 hold - Seated Neck Sidebending Stretch  - 1 x daily - 7 x weekly - 1 sets - 3 reps - 30 hold  ASSESSMENT:  CLINICAL IMPRESSION: Pt's initial response to PT has been positive. PT was completed for manual therapy as noted above. Pt then completed exercises for cervical mobility and postural and posterior chain strengthening. HEP was updated for pt to complete a few reps of chin tucks periodically throughout the day to address pain and posture and to progress posterior chain strengthening. Pt tolerated PT today without adverse effects.   Patient is a 68y.o. female who was seen today for physical therapy evaluation and treatment for cervicalgia and headache.   OBJECTIVE IMPAIRMENTS: decreased balance, decreased mobility, decreased ROM, decreased  strength, increased fascial restrictions, increased muscle spasms, impaired flexibility, impaired sensation, postural dysfunction, and pain.   ACTIVITY LIMITATIONS: carrying, lifting, sleeping, reach over head, hygiene/grooming, locomotion level, and caring for others  PARTICIPATION LIMITATIONS: meal prep, cleaning, laundry, community activity, and occupation  PERSONAL FACTORS: Past/current experiences and 3+ comorbidities: History of breast cancer, right-sided shoulder rotator cuff repair: Other orthopedic issues are also affecting patient's functional outcome.   REHAB POTENTIAL: Good  CLINICAL DECISION MAKING: Evolving/moderate complexity  EVALUATION COMPLEXITY: Moderate   GOALS: Goals reviewed with patient? Yes  SHORT TERM GOALS: Target date: 02/15/2024    Patient will be able to show independence for initial HEP to include posture, core and hip strength and stability.  Baseline:  Goal status: INITIAL  2.  Patient will be able to demonstrate proper sitting posture and upper extremity support in order to reduce pain at rest Baseline:  Goal status: INITIAL  3.  Patient will complete balance assessment and set goal Baseline:  Goal status: INITIAL   LONG TERM GOALS: Target date: 03/14/2024    Pt will be able to show I with final HEP upon discharge for posture,  lifting and UE strength     Baseline:  Goal status: INITIAL  2.  Patient will improve NDI score by 8.5 points Baseline: 20/50 Goal status: INITIAL  3.  Patient will be able to resume light home tasks without increased pain Baseline:  Goal status: INITIAL  4.  Patient will be able to care for her small grandkids without increased pain in her shoulder and arm Baseline:  Goal status: INITIAL  5.  Balance goal to be assessed Baseline:  Goal status: INITIAL  PLAN:  PT FREQUENCY: 2x/week  PT DURATION: 8 weeks  PLANNED INTERVENTIONS: 97164- PT Re-evaluation, 97750- Physical Performance Testing,  97110-Therapeutic exercises, 97530- Therapeutic activity, 97112- Neuromuscular re-education, 97535- Self Care, 62130- Manual therapy, [phone]- Gait training, Patient/Family education, Balance training, Joint mobilization, Cryotherapy, and Moist heat  PLAN FOR NEXT SESSION: Check home exercise program, manual therapy, symptom management, AROM bilateral shoulders    Bassy Fetterly MS, PT 01/24/24 4:22 PM   Referring diagnosis? M54.2,G89.29 (ICD-10-CM) - Chronic neck pain M54.2 (ICD-10-CM) - Cervicalgia   Treatment diagnosis? (if different than referring diagnosis) Radiculopathy, cervical region   Muscle weakness (generalized)   What was this (referring dx) caused by? []  Surgery []  Fall [x]  Ongoing issue [x]  Arthritis []  Other: ____________   Laterality: []  Rt []  Lt [x]  Both   Check all possible CPT codes:             *CHOOSE 10 OR LESS*                          See Planned Interventions listed in the Plan section of the Evaluation.    Aadan Chenier MS, PT 01/24/24 5:56 PM

## 2024-01-23 ENCOUNTER — Ambulatory Visit
Admission: RE | Admit: 2024-01-23 | Discharge: 2024-01-23 | Disposition: A | Discharge status: Home / Self Care | Source: Ambulatory Visit | Attending: Neurology

## 2024-01-23 DIAGNOSIS — M5412 Radiculopathy, cervical region: Secondary | ICD-10-CM

## 2024-01-23 DIAGNOSIS — R519 Headache, unspecified: Secondary | ICD-10-CM | POA: Diagnosis not present

## 2024-01-23 DIAGNOSIS — R200 Anesthesia of skin: Secondary | ICD-10-CM | POA: Diagnosis not present

## 2024-01-23 DIAGNOSIS — R29898 Other symptoms and signs involving the musculoskeletal system: Secondary | ICD-10-CM

## 2024-01-23 DIAGNOSIS — G8929 Other chronic pain: Secondary | ICD-10-CM

## 2024-01-23 DIAGNOSIS — M542 Cervicalgia: Secondary | ICD-10-CM | POA: Diagnosis not present

## 2024-01-23 NOTE — Telephone Encounter (Signed)
 Spoke to patient Per [facility] that particular tube expires in 24 hours. The blood draw was 01/16/2024 and today is 01/23/2024 so she will have to see PCP to get A1C drawn Pt expressed understanding and thanked me for calling

## 2024-01-24 ENCOUNTER — Telehealth: Payer: Self-pay | Admitting: Neurology

## 2024-01-24 ENCOUNTER — Ambulatory Visit: Payer: Self-pay

## 2024-01-24 DIAGNOSIS — M542 Cervicalgia: Secondary | ICD-10-CM | POA: Diagnosis not present

## 2024-01-24 DIAGNOSIS — M5412 Radiculopathy, cervical region: Secondary | ICD-10-CM | POA: Diagnosis not present

## 2024-01-24 DIAGNOSIS — R29898 Other symptoms and signs involving the musculoskeletal system: Secondary | ICD-10-CM

## 2024-01-24 DIAGNOSIS — M6281 Muscle weakness (generalized): Secondary | ICD-10-CM

## 2024-01-24 DIAGNOSIS — G8929 Other chronic pain: Secondary | ICD-10-CM

## 2024-01-24 NOTE — Telephone Encounter (Signed)
 Referral for neurosurgery fax to [facility]. Phone: [phone], Fax: [phone]

## 2024-01-24 NOTE — Telephone Encounter (Signed)
 I called and spoke to pt.  I relayed that per Dr. Tresia Fruit she recommended to see neurosurgery if she was amenable to that.  Pt had seen her results. You have arthritis in your neck and looks like nerve pinching that could cause your arm symptoms would like to send you to neurosurgery to take a look, they may recommend injections or other intervention    She was ok to see neurosurgery.  I told her to call us  back after a week if she had not heard back.   She verbalized understanding. Appreciated call back.

## 2024-01-24 NOTE — Telephone Encounter (Signed)
 Plesae call patient and see if she is amanable to seeing neurosurgery for her neck as below and place order for cervical radiculopathy and cervical stenosis thank you. See phone note  Hi Dominique Dillon, you have arthritis in your neck and looks like nerve pinching that could cause your arm symptoms I ould like to send you to neurosurgery to take a look, they may recommend injections or other intervention. I will ask my team to call and see if you are amenable thank you  For documentation, most significant findings:  At C4-C5, there are degenerative changes causing moderate spinal stenosis and moderate right greater than left foraminal narrowing.  Degenerative changes encroach upon the nerve roots without causing any definite nerve;  At C5-C6, there are degenerative changes causing mild spinal stenosis and moderately severe that could affect the C6 nerve roots.

## 2024-01-24 NOTE — Telephone Encounter (Signed)
 Spoke to patient Dominique Dillon already called patient this am and placed referral to Neurosurgery

## 2024-01-24 NOTE — Telephone Encounter (Signed)
-----   Message from Glory Larsen sent at 01/24/2024  9:54 AM EDT ----- [facility] call patient and see if she is amanable to seeing neurosurgery for her neck as below and place order for cervical radiculopathy and cervical stenosis thank you. See phone note  Hi Emiah, you have arthritis in your neck and looks like nerve pinching that could cause your arm symptoms I ould like to send you to neurosurgery to take a look, they may recommend injections or  other intervention. I will ask my team to call and see if you are amenable thank you ----- Message ----- From: Jorie Newness, MD Sent: 01/23/2024   6:53 PM EDT To: Glory Larsen, MD

## 2024-01-24 NOTE — Telephone Encounter (Signed)
-----   Message from Glory Larsen sent at 01/24/2024  9:54 AM EDT ----- [facility] call patient and see if she is amanable to seeing neurosurgery for her neck as below and place order for cervical radiculopathy and cervical stenosis thank you. See phone note  Hi Dominique Dillon, you have arthritis in your neck and looks like nerve pinching that could cause your arm symptoms I ould like to send you to neurosurgery to take a look, they may recommend injections or  other intervention. I will ask my team to call and see if you are amenable thank you ----- Message ----- From: Jorie Newness, MD Sent: 01/23/2024   6:53 PM EDT To: Glory Larsen, MD

## 2024-01-24 NOTE — Addendum Note (Signed)
 Addended by: Inocencio Mania S on: 01/24/2024 11:03 AM   Modules accepted: Orders

## 2024-01-24 NOTE — Progress Notes (Signed)
 Plesae call patient and see if she is amanable to seeing neurosurgery for her neck as below and place order for cervical radiculopathy and cervical stenosis thank you. See phone note  Hi Dominique Dillon, you have arthritis in your neck and looks like nerve pinching that could cause your arm symptoms I ould like to send you to neurosurgery to take a look, they may recommend injections or other intervention. I will ask my team to call and see if you are amenable thank you

## 2024-01-25 ENCOUNTER — Institutional Professional Consult (permissible substitution): Payer: Medicare PPO | Admitting: Neurology

## 2024-01-30 NOTE — Therapy (Signed)
 OUTPATIENT PHYSICAL THERAPY CERVICAL TREATMENT   Patient Name: Deneka Greenwalt MRN: [id number] DOB:09-Sep-1955, 69 y.o., female Today's Date: 01/31/2024  END OF SESSION:  PT End of Session - 01/31/24 1441     Visit Number 3    Number of Visits 16    Date for PT Re-Evaluation 03/14/24    Authorization Type [employer] M[employer]    PT Start Time 1415    PT Stop Time 1447    PT Time Calculation (min) 32 min    Activity Tolerance Patient tolerated treatment well    Behavior During Therapy WFL for tasks assessed/performed            Past Medical History:  Diagnosis Date   Allergic rhinitis    Arthritis    knees   Breast cancer of upper-outer quadrant of left female breast (HCC) 09/12/2014   DDD (degenerative disc disease), cervical    Depression    GERD (gastroesophageal reflux disease)    History of anemia    still takes iron supplement   Hypertension    under control with med., has been on med. since 1990s   Insulin  dependent diabetes mellitus    Obesity    Personal history of chemotherapy    Personal history of radiation therapy    Recurrent upper respiratory infection (URI)    Seasonal asthma    no current med.   Past Surgical History:  Procedure Laterality Date   ABDOMINAL HYSTERECTOMY  1995   complete   APPENDECTOMY  1980   BREAST BIOPSY Left 10/15/2015   BREAST BIOPSY Left 09/26/2015   BREAST BIOPSY Left 09/09/2014   BREAST LUMPECTOMY Left 10/01/2014   CARPAL TUNNEL RELEASE Right 08/06/2004   CESAREAN SECTION     x 2   CHOLECYSTECTOMY  1980   COLONOSCOPY     exploratory surgery of abdomen  11/1978   IRRIGATION AND DEBRIDEMENT ABSCESS N/A 03/08/2014   Procedure: IRRIGATION AND DEBRIDEMENT ABDOMINAL WALL ABSCESS;  Surgeon: Elspeth KYM Schultze, MD;  Location: [facility];  Service: General;  Laterality: N/A;   KNEE ARTHROSCOPY Bilateral    NM MYOVIEW  LTD  06/2015   LOW RISK. HTN response to exercise. No EKG changes. Small, partially reversible apical defect (probably Breast  Attenuation, but CRO apical Ischemia).  EF 63% with no RWMA   polyp removed from L breast     PORTACATH PLACEMENT Right 10/21/2014   Procedure: INSERTION PORT-A-CATH;  Surgeon: Donnice Bury, MD;  Location: [facility];  Service: General;  Laterality: Right;   RADIOACTIVE SEED GUIDED PARTIAL MASTECTOMY WITH AXILLARY SENTINEL LYMPH NODE BIOPSY Left 10/01/2014   Procedure: RADIOACTIVE SEED GUIDED LEFT BREAST LUMPECTOMY WITH LEFT  AXILLARY SENTINEL LYMPH NODE BIOPSY;  Surgeon: Donnice Bury, MD;  Location: [facility];  Service: General;  Laterality: Left;   RE-EXCISION OF BREAST LUMPECTOMY Left 10/21/2014   Procedure: RE-EXCISION OF BREAST CANCER, POSTERIOR MARGINS;  Surgeon: Donnice Bury, MD;  Location: [facility];  Service: General;  Laterality: Left;   TONSILLECTOMY  1980   TRIGGER FINGER RELEASE Right 08/06/2004   middle finger   UPPER GI ENDOSCOPY     Patient Active Problem List   Diagnosis Date Noted   Racing heart beat 07/01/2023   Acute hearing loss, left 12/07/2022   Left temporal headache 12/07/2022   Ear pain, left 12/07/2022   Dyslipidemia, goal LDL below 100 08/29/2015   Metabolic syndrome 08/29/2015   Precordial pain 05/28/2015   Right carotid bruit 05/28/2015   Intermittent  claudication (HCC) 05/28/2015   Hypoxia 05/20/2015   Hypotension 05/20/2015   Chest pain 05/20/2015   Hypokalemia 05/20/2015   Prolonged QT interval 05/20/2015   Syncope 05/20/2015   Chest pain at rest 05/20/2015   Obesity, Class II, BMI 35-39.9    Medication adverse effect    Thalassemia 03/05/2015   Seasonal allergies 02/08/2015   Cancer associated pain 02/08/2015   Glaucoma 12/31/2014   Breast cancer of upper-outer quadrant of left female breast (HCC) 09/12/2014   Abscess of abdominal wall s/p I&D 7/30- & 03/08/2014 03/08/2014   Cellulitis 03/08/2014   Diabetes mellitus, type II, insulin  dependent (HCC) 03/08/2014   Essential hypertension,  benign 03/08/2014   GERD (gastroesophageal reflux disease) 03/08/2014   Anemia in neoplastic disease 03/08/2014    PCP: Clarice Pouch MD [facility], [place])   REFERRING PROVIDER: Ines Onetha NOVAK, MD  REFERRING DIAG: [phone] (ICD-10-CM) - Chronic neck pain M54.2 (ICD-10-CM) - Cervicalgia  THERAPY DIAG:  Radiculopathy, cervical region  Muscle weakness (generalized)  Rationale for Evaluation and Treatment: Rehabilitation  ONSET DATE: 18 mos   SUBJECTIVE:                                                                                                                                                                                                         SUBJECTIVE STATEMENT: Pt reports her neck pain has continued to improve.  EVAL: Pt with pain in her neck and left upper extremity.  She reports frequent headaches, difficulty using her arms especially the left.  When she had her right shoulder surgery she had a nerve block and feels like ever since then her body has not been right.  She reports left upper extremity weakness and it feels like it does not belong to her.  She has numbness and tingling in the left upper extremity much of the time.  She reports she had a concussion when she initially injured her right shoulder back in 2022.  She has an MRI ordered by Dr. Ines. She was limited in work but now she is done for the summer.  She does endorse being off balance but does not use an AD.    Hand dominance: Right  PERTINENT HISTORY:  Rt RCR Dr. Selinda Belvie Gosling  Dec. 19, 2023 Osteoarthritis, right knee, hip  Dr. Ines note: New consult for recurrent headache. She was seen in 11/2022 for tinnitus and mri brain and mra of the head were unremarkable. Has had esr/crp checked in the past for headaches. She has had headaches since she fell back and hit  her head December 19th 2023 she has neck pain, more neck pain, radiates down the neck, shoots up from the back of the head to the  eye. Headache starts on the left side(points to the parietal area) and radiates to the left forehead and then back down the neck. A lot of neck tightness. She has a lot of neck muscle tightness and that is contributory, can't describe it just hurts. Radiates down the left arm to right elbow an dthe hand goes to sleep. She was on gabapentin  in the past for her bad legs, she is going to a podiatrist. She states she can't see as clear out of her left eye. She has jaw list. I don;t think she needs steroids as this is chronic ongoing since 2023 and had esr/crp in the past but will check again. In pain 24x7. Did well on gabapentin  but 3x a day is something that is diffult start lyrica . Pain every day, terrible, chronic pain 5-6/10 in the head and in the leg. Ongoing 2 years, been under the care of physicians, failed medical and conservative management.      PAIN:  Are you having pain? Yes: NPRS scale: 3/10 Pain location: neck  Pain description: tight  Aggravating factors: using it  Relieving factors: rest, does not take Lyrica  yet.   PRECAUTIONS: None  RED FLAGS: None     WEIGHT BEARING RESTRICTIONS: No  FALLS:  Has patient fallen in last 6 months? NoDoes feel off balance.   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   PATIENT GOALS: Pt would like to be able to feel better.  Get my mind right.   NEXT MD VISIT: As needed   OBJECTIVE:  Note: Objective measures were completed at Evaluation unless otherwise noted.  DIAGNOSTIC FINDINGS:  MRI injection  MRI 6/176/25 IMPRESSION: This MRI of the cervical spine without contrast shows the following: 1, The spinal cord appears normal. 2.  At C3-C4 there are  degenerative changes causing mild spinal stenosis and mild foraminal narrowing but no nerve root compression. 3.  At C4-C5, there are degenerative changes causing moderate spinal stenosis and moderate right greater than left foraminal narrowing.  Degenerative changes encroach upon the nerve roots without causing any definite nerve 4.  At C5-C6, there are degenerative changes causing mild spinal stenosis and moderately severe that could affect the C6 nerve roots. 5.  At C6-C7, there are degenerative changes causing mild spinal stenosis  PATIENT SURVEYS:  NDI:  NECK DISABILITY INDEX  Date: 01/18/24 Score  Pain intensity 2 = The pain is moderate at the moment  2. Personal care (washing, dressing, etc.) 0 = I can look after myself normally without causing extra pain  3. Lifting 3 = Pain prevents me from lifting heavy weights but I can manage light to medium   weights if they are conveniently positioned  4. Reading 2 =  I can read as much as I want with moderate pain in my neck  5. Headaches 3 = I have moderate headaches, which come frequently  6. Concentration 0 =  I can concentrate fully when I want to with no difficulty  7. Work 4 = I can hardly do any work at all  8. Driving  1 =  I can drive my car as long as I want with slight pain in my neck  9. Sleeping 1 = My sleep is slightly disturbed (less than 1 hr sleepless)  10. Recreation 3 = I am able to engage in a few of my usual recreation activities because of pain in   my neck  Total 20/50   Minimum Detectable Change (90% confidence): 5 points or 10% points  COGNITION: Overall cognitive status: Within functional limits for tasks assessed  SENSATION: L hand numb and tingling   POSTURE: sloped shoulders , forward head   PALPATION: Pain and soreness to palpation to bilateral posterior cervical muscle.  Manual traction improved tingling in left upper extremity   CERVICAL ROM:   Active ROM A/PROM (deg) eval  Flexion 48  Extension 50   Right lateral flexion 40 pain L   Left lateral flexion 35 pain R   Right rotation 55  Left rotation 55   (Blank rows = not tested)  UPPER EXTREMITY ROM:  Supine PROM all WNL with pain in L UE end range ABD/ER and flexion   Active ROM Right eval Left eval  Shoulder flexion AROM against gravity 120  AROM against gravity 105  Shoulder extension    Shoulder abduction    Shoulder adduction    Shoulder extension    Shoulder internal rotation    Shoulder external rotation    Elbow flexion    Elbow extension    Wrist flexion    Wrist extension    Wrist ulnar deviation    Wrist radial deviation    Wrist pronation    Wrist supination     (Blank rows = not tested)  UPPER EXTREMITY MMT:  MMT Right eval Left eval  Shoulder flexion 4- 3-  Shoulder extension    Shoulder abduction 4 3-  Shoulder adduction    Shoulder extension    Shoulder internal rotation 5 4  Shoulder external rotation 4+ 4  Middle trapezius    Lower trapezius    Elbow flexion    Elbow extension    Wrist flexion    Wrist extension    Wrist ulnar deviation    Wrist radial deviation    Wrist pronation    Wrist supination    Grip strength     (Blank rows = not tested)  CERVICAL SPECIAL TESTS:  Less N/T with traction   FUNCTIONAL TESTS:  NT   TREATMENT DATE:  [facility] Adult PT Treatment:                                                DATE: 01/31/24 Therapeutic Exercise: Supine chin tuck x10 5 Seated scapular retractions  x10 5 Seated lateral side bending x2 30 each Seated lateral side bending x2 30 each Seated rotation bending x2 30 each Standing chin tucks at wall c scapular retraction x10 3 Standing shoulder rows 2x10 GTB Manual Therapy: STM to the upper traps and cervical paraspinals UPAs to C2-C6 Suboccipital release Cervical traction  [facility] Adult PT Treatment:                                                DATE: 01/24/24 Therapeutic Exercise: Supine chin tuck  x10 5 Seated scapular  retractions  x10 5 Seated lateral side bending x3 30 each Standing chin tucks at wall x10 3 Standing shoulder rows 2x10 GTB Manual Therapy: STM to the upper traps and cervical paraspinals UPAs to C2-C6 Suboccipital release Cervical traction  01/18/24 Patient was evaluated for cervical pain with radiating symptoms into the left upper extremity Demonstrated home exercise program including chin tuck, scapular retraction, cervical flexion and extension using a towel, upper trap stretches with towel draped over shoulder to anchor shoulder down. Recommended she use moist heat and prescribed medication for pain Discussed trigger point dry needling and how it is now cash based.  She would like to proceed with the MRI first before doing trigger point dry needling.                                                                                                                                PATIENT EDUCATION:  Education details: see above  Person educated: Patient Education method: Programmer, multimedia, Demonstration, Verbal cues, and Handouts Education comprehension: verbalized understanding, returned demonstration, and needs further education  HOME EXERCISE PROGRAM: Access Code: [email or web address] URL: [email or web address] Date: 01/24/2024 Prepared by: Dasie Daft  Exercises - Supine Cervical Retraction with Towel  - 1 x daily - 7 x weekly - 2 sets - 10 reps - 5 hold - Standing Cervical Retraction  - 6 x daily - 7 x weekly - 3 sets - 3 reps - 5 hold - Seated Scapular Retraction  - 1 x daily - 7 x weekly - 2 sets - 5 reps - 5 hold - Standing Shoulder Row with Anchored Resistance  - 1 x daily - 7 x weekly - 2 sets - 15 reps - 3 hold - Seated Neck Sidebending Stretch  - 1 x daily - 7 x weekly - 1 sets - 3 reps - 30 hold  ASSESSMENT:  CLINICAL IMPRESSION: PT was completed for manual therapy as noted above. Exercises were then completed to address cervical mobility and postural and posterior  chain strengthening. Pt reports further reduction in neck pain from last week. Pt will continue to benefit from skilled PT to address impairments for improved neck function with minimized pain. Will consider a trial of Estim to see the pt could benefit from a TENs unit as a pain management tool.  Patient is a 68y.o. female who was seen today for physical therapy evaluation and treatment for cervicalgia and headache.   OBJECTIVE IMPAIRMENTS: decreased balance, decreased mobility, decreased ROM, decreased strength, increased fascial restrictions, increased muscle spasms, impaired flexibility, impaired sensation, postural dysfunction, and pain.   ACTIVITY LIMITATIONS: carrying, lifting, sleeping, reach over head, hygiene/grooming, locomotion level, and caring for others  PARTICIPATION LIMITATIONS: meal prep, cleaning, laundry, community activity, and occupation  PERSONAL FACTORS: Past/current experiences and 3+ comorbidities: History of breast cancer, right-sided shoulder rotator cuff repair: Other orthopedic issues are also affecting patient's functional outcome.   REHAB POTENTIAL: Good  CLINICAL DECISION MAKING: Evolving/moderate complexity  EVALUATION COMPLEXITY: Moderate   GOALS: Goals reviewed with patient? Yes  SHORT TERM GOALS: Target date: 02/15/2024    Patient will be able to show independence for initial HEP to include posture, core and hip strength and stability.  Baseline:  Goal status: INITIAL  2.  Patient will be able to demonstrate proper sitting posture and upper extremity support in order to reduce pain at rest Baseline:  Goal status: INITIAL  3.  Patient will complete balance assessment and set goal Baseline:  Goal status: INITIAL   LONG TERM GOALS: Target date: 03/14/2024    Pt will be able to show I with final HEP upon discharge for posture, lifting and UE strength     Baseline:  Goal status: INITIAL  2.  Patient will improve NDI score by 8.5  points Baseline: 20/50 Goal status: INITIAL  3.  Patient will be able to resume light home tasks without increased pain Baseline:  Goal status: INITIAL  4.  Patient will be able to care for her small grandkids without increased pain in her shoulder and arm Baseline:  Goal status: INITIAL  5.  Balance goal to be assessed Baseline:  Goal status: INITIAL  PLAN:  PT FREQUENCY: 2x/week  PT DURATION: 8 weeks  PLANNED INTERVENTIONS: 97164- PT Re-evaluation, 97750- Physical Performance Testing, 97110-Therapeutic exercises, 97530- Therapeutic activity, 97112- Neuromuscular re-education, 97535- Self Care, 02859- Manual therapy, [id number]- Gait training, Patient/Family education, Balance training, Joint mobilization, Cryotherapy, and Moist heat  PLAN FOR NEXT SESSION: Check home exercise program, manual therapy, symptom management, AROM bilateral shoulders    Journey Castonguay MS, PT 01/31/24 10:10 PM

## 2024-01-31 ENCOUNTER — Ambulatory Visit

## 2024-01-31 DIAGNOSIS — G8929 Other chronic pain: Secondary | ICD-10-CM | POA: Diagnosis not present

## 2024-01-31 DIAGNOSIS — M5412 Radiculopathy, cervical region: Secondary | ICD-10-CM | POA: Diagnosis not present

## 2024-01-31 DIAGNOSIS — M6281 Muscle weakness (generalized): Secondary | ICD-10-CM

## 2024-01-31 DIAGNOSIS — M542 Cervicalgia: Secondary | ICD-10-CM | POA: Diagnosis not present

## 2024-02-01 ENCOUNTER — Ambulatory Visit: Admitting: Podiatry

## 2024-02-01 ENCOUNTER — Encounter: Payer: Self-pay | Admitting: Podiatry

## 2024-02-01 VITALS — Ht 63.0 in | Wt 180.0 lb

## 2024-02-01 DIAGNOSIS — E119 Type 2 diabetes mellitus without complications: Secondary | ICD-10-CM

## 2024-02-01 DIAGNOSIS — B351 Tinea unguium: Secondary | ICD-10-CM

## 2024-02-01 DIAGNOSIS — M79675 Pain in left toe(s): Secondary | ICD-10-CM | POA: Diagnosis not present

## 2024-02-01 DIAGNOSIS — M79674 Pain in right toe(s): Secondary | ICD-10-CM | POA: Diagnosis not present

## 2024-02-01 NOTE — Patient Instructions (Addendum)
 SABRA

## 2024-02-01 NOTE — Progress Notes (Signed)
   Chief Complaint  Patient presents with   Callouses    Pt is here due to callous on the side of her left foot states it has been there for a while and causing pain when she is on her feet for a long time states she would also like her toenail trimmed.    SUBJECTIVE Patient with a history of diabetes mellitus presents to office today complaining of elongated, thickened nails that cause pain while ambulating in shoes.  Patient is unable to trim their own nails. Patient is here for further evaluation and treatment.  Past Medical History:  Diagnosis Date   Allergic rhinitis    Arthritis    knees   Breast cancer of upper-outer quadrant of left female breast (HCC) 09/12/2014   DDD (degenerative disc disease), cervical    Depression    GERD (gastroesophageal reflux disease)    History of anemia    still takes iron supplement   Hypertension    under control with med., has been on med. since 1990s   Insulin  dependent diabetes mellitus    Obesity    Personal history of chemotherapy    Personal history of radiation therapy    Recurrent upper respiratory infection (URI)    Seasonal asthma    no current med.    Allergies  Allergen Reactions   Crestor [Rosuvastatin]     Chest pain, aches   Humalog [Insulin  Lispro]    Lipitor [Atorvastatin ]     Chest pain, aches   Macrobid [Nitrofurantoin Monohyd Macro] Hives and Itching    Chest pain   Biaxin [Clarithromycin] Other (See Comments)    UNKNOWN     OBJECTIVE General Patient is awake, alert, and oriented x 3 and in no acute distress. Derm Skin is dry and supple bilateral. Negative open lesions or macerations. Remaining integument unremarkable. Nails are tender, long, thickened and dystrophic with subungual debris, consistent with onychomycosis, 1-5 bilateral. No signs of infection noted. Vasc  DP and PT pedal pulses palpable bilaterally. Temperature gradient within normal limits.  Neuro light touch and protective threshold sensation  grossly intact bilaterally.  Musculoskeletal Exam No symptomatic pedal deformities noted bilateral. Muscular strength within normal limits.  ASSESSMENT 1. Diabetes Mellitus w/ peripheral neuropathy 2.  Pain due to onychomycosis of toenails bilateral 3.  Encounter for diabetic foot exam  PLAN OF CARE 1. Patient evaluated today.  Comprehensive diabetic foot exam performed today 2. Instructed to maintain good pedal hygiene and foot care. Stressed importance of controlling blood sugar.  3. Mechanical debridement of nails 1-5 bilaterally performed using a nail nipper. Filed with dremel without incident.  4. Return to clinic in 3 mos. routine footcare    Thresa EMERSON Sar, DPM [facility]  Dr. Thresa EMERSON Sar, DPM    [address] [place], [place] [address]                Office [phone]  Fax [phone]

## 2024-02-02 ENCOUNTER — Other Ambulatory Visit: Payer: Self-pay | Admitting: Internal Medicine

## 2024-02-02 ENCOUNTER — Ambulatory Visit: Admitting: Physical Therapy

## 2024-02-02 DIAGNOSIS — Z1231 Encounter for screening mammogram for malignant neoplasm of breast: Secondary | ICD-10-CM

## 2024-02-06 ENCOUNTER — Encounter: Payer: Self-pay | Admitting: Physical Therapy

## 2024-02-06 ENCOUNTER — Ambulatory Visit: Admitting: Physical Therapy

## 2024-02-06 DIAGNOSIS — G8929 Other chronic pain: Secondary | ICD-10-CM | POA: Diagnosis not present

## 2024-02-06 DIAGNOSIS — M5412 Radiculopathy, cervical region: Secondary | ICD-10-CM | POA: Diagnosis not present

## 2024-02-06 DIAGNOSIS — M6281 Muscle weakness (generalized): Secondary | ICD-10-CM | POA: Diagnosis not present

## 2024-02-06 DIAGNOSIS — H40023 Open angle with borderline findings, high risk, bilateral: Secondary | ICD-10-CM | POA: Diagnosis not present

## 2024-02-06 DIAGNOSIS — H40053 Ocular hypertension, bilateral: Secondary | ICD-10-CM | POA: Diagnosis not present

## 2024-02-06 DIAGNOSIS — M542 Cervicalgia: Secondary | ICD-10-CM | POA: Diagnosis not present

## 2024-02-06 NOTE — Therapy (Signed)
 OUTPATIENT PHYSICAL THERAPY CERVICAL TREATMENT   Patient Name: Dominique Dillon MRN: [id number] DOB:[birth year], 68 y.o., female Today's Date: 02/06/2024  END OF SESSION:  PT End of Session - 02/06/24 0839     Visit Number 4    Number of Visits 16    Date for PT Re-Evaluation 03/14/24    Authorization Type [employer]    Authorization Time Period 01/18/24-03/03/24    Authorization - Visit Number 4    Authorization - Number of Visits 12    PT Start Time 0845    PT Stop Time 0940    PT Time Calculation (min) 55 min            Past Medical History:  Diagnosis Date   Allergic rhinitis    Arthritis    knees   Breast cancer of upper-outer quadrant of left female breast (HCC) 09/12/2014   DDD (degenerative disc disease), cervical    Depression    GERD (gastroesophageal reflux disease)    History of anemia    still takes iron supplement   Hypertension    under control with med., has been on med. since 1990s   Insulin  dependent diabetes mellitus    Obesity    Personal history of chemotherapy    Personal history of radiation therapy    Recurrent upper respiratory infection (URI)    Seasonal asthma    no current med.   Past Surgical History:  Procedure Laterality Date   ABDOMINAL HYSTERECTOMY  1995   complete   APPENDECTOMY  1980   BREAST BIOPSY Left 10/15/2015   BREAST BIOPSY Left 09/26/2015   BREAST BIOPSY Left 09/09/2014   BREAST LUMPECTOMY Left 10/01/2014   CARPAL TUNNEL RELEASE Right 08/06/2004   CESAREAN SECTION     x 2   CHOLECYSTECTOMY  1980   COLONOSCOPY     exploratory surgery of abdomen  11/1978   IRRIGATION AND DEBRIDEMENT ABSCESS N/A 03/08/2014   Procedure: IRRIGATION AND DEBRIDEMENT ABDOMINAL WALL ABSCESS;  Surgeon: Elspeth KYM Schultze, MD;  Location: [facility];  Service: General;  Laterality: N/A;   KNEE ARTHROSCOPY Bilateral    NM MYOVIEW  LTD  06/2015   LOW RISK. HTN response to exercise. No EKG changes. Small, partially reversible apical defect (probably  Breast Attenuation, but CRO apical Ischemia).  EF 63% with no RWMA   polyp removed from L breast     PORTACATH PLACEMENT Right 10/21/2014   Procedure: INSERTION PORT-A-CATH;  Surgeon: Donnice Bury, MD;  Location: [facility];  Service: General;  Laterality: Right;   RADIOACTIVE SEED GUIDED PARTIAL MASTECTOMY WITH AXILLARY SENTINEL LYMPH NODE BIOPSY Left 10/01/2014   Procedure: RADIOACTIVE SEED GUIDED LEFT BREAST LUMPECTOMY WITH LEFT  AXILLARY SENTINEL LYMPH NODE BIOPSY;  Surgeon: Donnice Bury, MD;  Location: [facility];  Service: General;  Laterality: Left;   RE-EXCISION OF BREAST LUMPECTOMY Left 10/21/2014   Procedure: RE-EXCISION OF BREAST CANCER, POSTERIOR MARGINS;  Surgeon: Donnice Bury, MD;  Location: [facility];  Service: General;  Laterality: Left;   TONSILLECTOMY  1980   TRIGGER FINGER RELEASE Right 08/06/2004   middle finger   UPPER GI ENDOSCOPY     Patient Active Problem List   Diagnosis Date Noted   Racing heart beat 07/01/2023   Acute hearing loss, left 12/07/2022   Left temporal headache 12/07/2022   Ear pain, left 12/07/2022   Dyslipidemia, goal LDL below 100 08/29/2015   Metabolic syndrome 08/29/2015   Precordial pain 05/28/2015   Right carotid  bruit 05/28/2015   Intermittent claudication (HCC) 05/28/2015   Hypoxia 05/20/2015   Hypotension 05/20/2015   Chest pain 05/20/2015   Hypokalemia 05/20/2015   Prolonged QT interval 05/20/2015   Syncope 05/20/2015   Chest pain at rest 05/20/2015   Obesity, Class II, BMI 35-39.9    Medication adverse effect    Thalassemia 03/05/2015   Seasonal allergies 02/08/2015   Cancer associated pain 02/08/2015   Glaucoma 12/31/2014   Breast cancer of upper-outer quadrant of left female breast (HCC) 09/12/2014   Abscess of abdominal wall s/p I&D 7/30- & 03/08/2014 03/08/2014   Cellulitis 03/08/2014   Diabetes mellitus, type II, insulin  dependent (HCC) 03/08/2014   Essential  hypertension, benign 03/08/2014   GERD (gastroesophageal reflux disease) 03/08/2014   Anemia in neoplastic disease 03/08/2014    PCP: Clarice Pouch MD [facility] [place])   REFERRING PROVIDER: Ines Onetha NOVAK, MD  REFERRING DIAG: [phone] (ICD-10-CM) - Chronic neck pain M54.2 (ICD-10-CM) - Cervicalgia  THERAPY DIAG:  Radiculopathy, cervical region  Muscle weakness (generalized)  Rationale for Evaluation and Treatment: Rehabilitation  ONSET DATE: 18 mos   SUBJECTIVE:                                                                                                                                                                                                         SUBJECTIVE STATEMENT: I have a pinched nerve per the MRI and I will see a neurosurgeon this week for a consult. The pain is 2/10 now and the pain travels to elbow, no longer going into my forearm and hand.    EVAL: Pt with pain in her neck and left upper extremity.  She reports frequent headaches, difficulty using her arms especially the left.  When she had her right shoulder surgery she had a nerve block and feels like ever since then her body has not been right.  She reports left upper extremity weakness and it feels like it does not belong to her.  She has numbness and tingling in the left upper extremity much of the time.  She reports she had a concussion when she initially injured her right shoulder back in 2022.  She has an MRI ordered by Dr. Ines. She was limited in work but now she is done for the summer.  She does endorse being off balance but does not use an AD.    Hand dominance: Right  PERTINENT HISTORY:  Rt RCR Dr. Selinda Belvie Gosling  Dec. 19, 2023 Osteoarthritis, right knee, hip  Dr. Ines note: New consult for recurrent headache.  She was seen in 11/2022 for tinnitus and mri brain and mra of the head were unremarkable. Has had esr/crp checked in the past for headaches. She has had headaches since she  fell back and hit her head December 19th 2023 she has neck pain, more neck pain, radiates down the neck, shoots up from the back of the head to the eye. Headache starts on the left side(points to the parietal area) and radiates to the left forehead and then back down the neck. A lot of neck tightness. She has a lot of neck muscle tightness and that is contributory, can't describe it just hurts. Radiates down the left arm to right elbow an dthe hand goes to sleep. She was on gabapentin  in the past for her bad legs, she is going to a podiatrist. She states she can't see as clear out of her left eye. She has jaw list. I don;t think she needs steroids as this is chronic ongoing since 2023 and had esr/crp in the past but will check again. In pain 24x7. Did well on gabapentin  but 3x a day is something that is diffult start lyrica . Pain every day, terrible, chronic pain 5-6/10 in the head and in the leg. Ongoing 2 years, been under the care of physicians, failed medical and conservative management.      PAIN:  Are you having pain? Yes: NPRS scale: 3/10 Pain location: neck  Pain description: tight  Aggravating factors: using it  Relieving factors: rest, does not take Lyrica  yet.   PRECAUTIONS: None  RED FLAGS: None     WEIGHT BEARING RESTRICTIONS: No  FALLS:  Has patient fallen in last 6 months? NoDoes feel off balance.   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   PATIENT GOALS: Pt would like to be able to feel better.  Get my mind right.   NEXT MD VISIT: As needed   OBJECTIVE:  Note: Objective measures were completed at Evaluation unless otherwise noted.  DIAGNOSTIC FINDINGS:  MRI injection  MRI  6/176/25 IMPRESSION: This MRI of the cervical spine without contrast shows the following: 1, The spinal cord appears normal. 2.  At C3-C4 there are degenerative changes causing mild spinal stenosis and mild foraminal narrowing but no nerve root compression. 3.  At C4-C5, there are degenerative changes causing moderate spinal stenosis and moderate right greater than left foraminal narrowing.  Degenerative changes encroach upon the nerve roots without causing any definite nerve 4.  At C5-C6, there are degenerative changes causing mild spinal stenosis and moderately severe that could affect the C6 nerve roots. 5.  At C6-C7, there are degenerative changes causing mild spinal stenosis  PATIENT SURVEYS:  NDI:  NECK DISABILITY INDEX  Date: 01/18/24 Score  Pain intensity 2 = The pain is moderate at the moment  2. Personal care (washing, dressing, etc.) 0 = I can look after myself normally without causing extra pain  3. Lifting 3 = Pain prevents me from lifting heavy weights but I can manage light to medium   weights if they are conveniently positioned  4. Reading 2 =  I can read as much as I want with moderate pain in my neck  5. Headaches 3 = I have moderate headaches,  which come frequently  6. Concentration 0 =  I can concentrate fully when I want to with no difficulty  7. Work 4 = I can hardly do any work at all  8. Driving 1 =  I can drive my car as long as I want with slight pain in my neck  9. Sleeping 1 = My sleep is slightly disturbed (less than 1 hr sleepless)  10. Recreation 3 = I am able to engage in a few of my usual recreation activities because of pain in   my neck  Total 20/50   Minimum Detectable Change (90% confidence): 5 points or 10% points  COGNITION: Overall cognitive status: Within functional limits for tasks assessed  SENSATION: L hand numb and tingling   POSTURE: sloped shoulders , forward head   PALPATION: Pain and soreness to palpation to bilateral posterior  cervical muscle.  Manual traction improved tingling in left upper extremity   CERVICAL ROM:   Active ROM A/PROM (deg) eval  Flexion 48  Extension 50  Right lateral flexion 40 pain L   Left lateral flexion 35 pain R   Right rotation 55  Left rotation 55   (Blank rows = not tested)  UPPER EXTREMITY ROM:  Supine PROM all WNL with pain in L UE end range ABD/ER and flexion   Active ROM Right eval Left eval  Shoulder flexion AROM against gravity 120  AROM against gravity 105  Shoulder extension    Shoulder abduction    Shoulder adduction    Shoulder extension    Shoulder internal rotation    Shoulder external rotation    Elbow flexion    Elbow extension    Wrist flexion    Wrist extension    Wrist ulnar deviation    Wrist radial deviation    Wrist pronation    Wrist supination     (Blank rows = not tested)  UPPER EXTREMITY MMT:  MMT Right eval Left eval  Shoulder flexion 4- 3-  Shoulder extension    Shoulder abduction 4 3-  Shoulder adduction    Shoulder extension    Shoulder internal rotation 5 4  Shoulder external rotation 4+ 4  Middle trapezius    Lower trapezius    Elbow flexion    Elbow extension    Wrist flexion    Wrist extension    Wrist ulnar deviation    Wrist radial deviation    Wrist pronation    Wrist supination    Grip strength     (Blank rows = not tested)  CERVICAL SPECIAL TESTS:  Less N/T with traction   FUNCTIONAL TESTS:  NT   TREATMENT DATE:  [facility] Adult PT Treatment:                                                DATE: 02/06/24 Therapeutic Exercise: Supine chin tuck x10 5 Seated scapular retractions  x10 5 Seated lateral side bending x2 30 each Seated rotation x2 30 each Standing shoulder rows 2x10 GTB Supine dowel pull over  Modalities IFC to cervical concurrent with HMP 10.5 mA  Physical Performance Test BERG BALANCE TEST Sitting to Standing: 4.      Stands without using hands and stabilize independently Standing  Unsupported: 4.      Stands safely for 2 minutes Sitting Unsupported: 4.  Sits for 2 minutes independently Standing to Sitting: 4.     Sits safely with minimal use of hands Transfers: 4.     Transfers safely with minor use of hands Standing with eyes closed: 4.     Stands safely for 10 seconds  Standing with feet together: 4.     Stands for 1 minute safely Reaching forward with outstretched arm: 4.     Reaches forward 10 inches Retrieving object from the floor: 4.      Able to pick up easily and safely Turning to look behind: 4.     Looks behind from both sides and weight shifts well Turning 360 degrees: 2.     Able to turn slowly, but safely Place alternate foot on stool: 4.     Completes 8 steps in 20 seconds     Standing with one foot in front: 4.     Independent tandem for 30 seconds  Standing on one foot: 4.     Holds >10 seconds  Total Score: 54/56  Berg <36 = High risk for falls (close to 100%)  37-45 = Significant (>80%) fall risk 46-51 = Moderate (>50%) fall risk 52-55 = Lower (>25%) fall risk   Score 26.7-39.6 = patient should use walker full-time Score 44 - 46.5 = patient should use cane indoor Score 47 - 49.6 = patient should use cane outdoor Score 47.9 - 51.1 = no assistive device needed     [facility] Adult PT Treatment:                                                DATE: 01/31/24 Therapeutic Exercise: Supine chin tuck x10 5 Seated scapular retractions  x10 5 Seated lateral side bending x2 30 each Seated lateral side bending x2 30 each Seated rotation bending x2 30 each Standing chin tucks at wall c scapular retraction x10 3 Standing shoulder rows 2x10 GTB Manual Therapy: STM to the upper traps and cervical paraspinals UPAs to C2-C6 Suboccipital release Cervical traction  [facility] Adult PT Treatment:                                                DATE: 01/24/24 Therapeutic Exercise: Supine chin tuck x10 5 Seated scapular retractions  x10 5 Seated lateral  side bending x3 30 each Standing chin tucks at wall x10 3 Standing shoulder rows 2x10 GTB Manual Therapy: STM to the upper traps and cervical paraspinals UPAs to C2-C6 Suboccipital release Cervical traction  01/18/24 Patient was evaluated for cervical pain with radiating symptoms into the left upper extremity Demonstrated home exercise program including chin tuck, scapular retraction, cervical flexion and extension using a towel, upper trap stretches with towel draped over shoulder to anchor shoulder down. Recommended she use moist heat and prescribed medication for pain Discussed trigger point dry needling and how it is now cash based.  She would like to proceed with the MRI first before doing trigger point dry needling.  PATIENT EDUCATION:  Education details: see above  Person educated: Patient Education method: Programmer, multimedia, Demonstration, Verbal cues, and Handouts Education comprehension: verbalized understanding, returned demonstration, and needs further education  HOME EXERCISE PROGRAM: Access Code: [email or web address] URL: [email or web address] Date: 01/24/2024 Prepared by: Dasie Daft  Exercises - Supine Cervical Retraction with Towel  - 1 x daily - 7 x weekly - 2 sets - 10 reps - 5 hold - Standing Cervical Retraction  - 6 x daily - 7 x weekly - 3 sets - 3 reps - 5 hold - Seated Scapular Retraction  - 1 x daily - 7 x weekly - 2 sets - 5 reps - 5 hold - Standing Shoulder Row with Anchored Resistance  - 1 x daily - 7 x weekly - 2 sets - 15 reps - 3 hold - Seated Neck Sidebending Stretch  - 1 x daily - 7 x weekly - 1 sets - 3 reps - 30 hold  ASSESSMENT:  CLINICAL IMPRESSION: PT was completed for therex as noted above. Exercises were completed to address cervical mobility and postural and posterior chain strengthening. Pt reports  reduction in arm  pain from last week. Pt will continue to benefit from skilled PT to address impairments for improved neck function with minimized pain. BERG balance test administered and pt found to be <25% risk for falls. No AD recommended. Trial of Estim to see the pt could benefit from a TENs unit as a pain management tool. She reported a positive response. Could consider another attempt if pain level is higher in the future.   Patient is a 68y.o. female who was seen today for physical therapy evaluation and treatment for cervicalgia and headache.   OBJECTIVE IMPAIRMENTS: decreased balance, decreased mobility, decreased ROM, decreased strength, increased fascial restrictions, increased muscle spasms, impaired flexibility, impaired sensation, postural dysfunction, and pain.   ACTIVITY LIMITATIONS: carrying, lifting, sleeping, reach over head, hygiene/grooming, locomotion level, and caring for others  PARTICIPATION LIMITATIONS: meal prep, cleaning, laundry, community activity, and occupation  PERSONAL FACTORS: Past/current experiences and 3+ comorbidities: History of breast cancer, right-sided shoulder rotator cuff repair: Other orthopedic issues are also affecting patient's functional outcome.   REHAB POTENTIAL: Good  CLINICAL DECISION MAKING: Evolving/moderate complexity  EVALUATION COMPLEXITY: Moderate   GOALS: Goals reviewed with patient? Yes  SHORT TERM GOALS: Target date: 02/15/2024    Patient will be able to show independence for initial HEP to include posture, core and hip strength and stability.  Baseline:  Goal status: INITIAL  2.  Patient will be able to demonstrate proper sitting posture and upper extremity support in order to reduce pain at rest Baseline:  Goal status: INITIAL  3.  Patient will complete balance assessment and set goal Baseline:  Goal status: INITIAL   LONG TERM GOALS: Target date: 03/14/2024    Pt will be able to show I with final HEP upon discharge for posture,  lifting and UE strength     Baseline:  Goal status: INITIAL  2.  Patient will improve NDI score by 8.5 points Baseline: 20/50 Goal status: INITIAL  3.  Patient will be able to resume light home tasks without increased pain Baseline:  Goal status: INITIAL  4.  Patient will be able to care for her small grandkids without increased pain in her shoulder and arm Baseline:  Goal status: INITIAL  5.  Balance goal to be assessed Baseline:  Goal status: INITIAL  PLAN:  PT FREQUENCY: 2x/week  PT DURATION: 8 weeks  PLANNED INTERVENTIONS: 02835-  PT Re-evaluation, 97750- Physical Performance Testing, 97110-Therapeutic exercises, 97530- Therapeutic activity, 97112- Neuromuscular re-education, [phone]- Self Care, 02859- Manual therapy, [phone]- Gait training, Patient/Family education, Balance training, Joint mobilization, Cryotherapy, and Moist heat  PLAN FOR NEXT SESSION: Check home exercise program, manual therapy, symptom management, AROM bilateral shoulders   Harlene Persons, PTA 02/06/24 10:25 AM Phone: [phone] Fax: [phone]

## 2024-02-08 DIAGNOSIS — Z6831 Body mass index (BMI) 31.0-31.9, adult: Secondary | ICD-10-CM | POA: Diagnosis not present

## 2024-02-08 DIAGNOSIS — M5090 Cervical disc disorder, unspecified, unspecified cervical region: Secondary | ICD-10-CM | POA: Diagnosis not present

## 2024-02-08 NOTE — Therapy (Signed)
 OUTPATIENT PHYSICAL THERAPY CERVICAL TREATMENT   Patient Name: Dominique Dillon MRN: [id number] DOB:Nov 01, 1955, 68 y.o., female Today's Date: 02/09/2024  END OF SESSION:  PT End of Session - 02/09/24 1116     Visit Number 5    Number of Visits 16    Date for PT Re-Evaluation 03/14/24    Authorization Type [employer]    Authorization Time Period 01/18/24-03/03/24    Authorization - Visit Number 5    Authorization - Number of Visits 12    PT Start Time 1105    PT Stop Time 1155    PT Time Calculation (min) 50 min    Activity Tolerance Patient tolerated treatment well    Behavior During Therapy WFL for tasks assessed/performed             Past Medical History:  Diagnosis Date   Allergic rhinitis    Arthritis    knees   Breast cancer of upper-outer quadrant of left female breast (HCC) 09/12/2014   DDD (degenerative disc disease), cervical    Depression    GERD (gastroesophageal reflux disease)    History of anemia    still takes iron supplement   Hypertension    under control with med., has been on med. since 1990s   Insulin  dependent diabetes mellitus    Obesity    Personal history of chemotherapy    Personal history of radiation therapy    Recurrent upper respiratory infection (URI)    Seasonal asthma    no current med.   Past Surgical History:  Procedure Laterality Date   ABDOMINAL HYSTERECTOMY  1995   complete   APPENDECTOMY  1980   BREAST BIOPSY Left 10/15/2015   BREAST BIOPSY Left 09/26/2015   BREAST BIOPSY Left 09/09/2014   BREAST LUMPECTOMY Left 10/01/2014   CARPAL TUNNEL RELEASE Right 08/06/2004   CESAREAN SECTION     x 2   CHOLECYSTECTOMY  1980   COLONOSCOPY     exploratory surgery of abdomen  11/1978   IRRIGATION AND DEBRIDEMENT ABSCESS N/A 03/08/2014   Procedure: IRRIGATION AND DEBRIDEMENT ABDOMINAL WALL ABSCESS;  Surgeon: Elspeth KYM Schultze, MD;  Location: [facility];  Service: General;  Laterality: N/A;   KNEE ARTHROSCOPY Bilateral    NM MYOVIEW  LTD   06/2015   LOW RISK. HTN response to exercise. No EKG changes. Small, partially reversible apical defect (probably Breast Attenuation, but CRO apical Ischemia).  EF 63% with no RWMA   polyp removed from L breast     PORTACATH PLACEMENT Right 10/21/2014   Procedure: INSERTION PORT-A-CATH;  Surgeon: Donnice Bury, MD;  Location: [facility];  Service: General;  Laterality: Right;   RADIOACTIVE SEED GUIDED PARTIAL MASTECTOMY WITH AXILLARY SENTINEL LYMPH NODE BIOPSY Left 10/01/2014   Procedure: RADIOACTIVE SEED GUIDED LEFT BREAST LUMPECTOMY WITH LEFT  AXILLARY SENTINEL LYMPH NODE BIOPSY;  Surgeon: Donnice Bury, MD;  Location: [facility];  Service: General;  Laterality: Left;   RE-EXCISION OF BREAST LUMPECTOMY Left 10/21/2014   Procedure: RE-EXCISION OF BREAST CANCER, POSTERIOR MARGINS;  Surgeon: Donnice Bury, MD;  Location: [facility];  Service: General;  Laterality: Left;   TONSILLECTOMY  1980   TRIGGER FINGER RELEASE Right 08/06/2004   middle finger   UPPER GI ENDOSCOPY     Patient Active Problem List   Diagnosis Date Noted   Racing heart beat 07/01/2023   Acute hearing loss, left 12/07/2022   Left temporal headache 12/07/2022   Ear pain, left 12/07/2022  Dyslipidemia, goal LDL below 100 08/29/2015   Metabolic syndrome 08/29/2015   Precordial pain 05/28/2015   Right carotid bruit 05/28/2015   Intermittent claudication (HCC) 05/28/2015   Hypoxia 05/20/2015   Hypotension 05/20/2015   Chest pain 05/20/2015   Hypokalemia 05/20/2015   Prolonged QT interval 05/20/2015   Syncope 05/20/2015   Chest pain at rest 05/20/2015   Obesity, Class II, BMI 35-39.9    Medication adverse effect    Thalassemia 03/05/2015   Seasonal allergies 02/08/2015   Cancer associated pain 02/08/2015   Glaucoma 12/31/2014   Breast cancer of upper-outer quadrant of left female breast (HCC) 09/12/2014   Abscess of abdominal wall s/p I&D 7/30- & 03/08/2014  03/08/2014   Cellulitis 03/08/2014   Diabetes mellitus, type II, insulin  dependent (HCC) 03/08/2014   Essential hypertension, benign 03/08/2014   GERD (gastroesophageal reflux disease) 03/08/2014   Anemia in neoplastic disease 03/08/2014    PCP: Clarice Pouch MD [facility], [place])   REFERRING PROVIDER: Ines Onetha NOVAK, MD  REFERRING DIAG: [phone] (ICD-10-CM) - Chronic neck pain M54.2 (ICD-10-CM) - Cervicalgia  THERAPY DIAG:  Radiculopathy, cervical region  Muscle weakness (generalized)  Rationale for Evaluation and Treatment: Rehabilitation  ONSET DATE: 18 mos   SUBJECTIVE:                                                                                                                                                                                                         SUBJECTIVE STATEMENT: Pt states her neck is continuing to do better. Pt thinks the Estim she received the last session was hepful.She notes seeing the neurosurgeon yesterday and she is planning to hold off on surgery at this time.  EVAL: Pt with pain in her neck and left upper extremity.  She reports frequent headaches, difficulty using her arms especially the left.  When she had her right shoulder surgery she had a nerve block and feels like ever since then her body has not been right.  She reports left upper extremity weakness and it feels like it does not belong to her.  She has numbness and tingling in the left upper extremity much of the time.  She reports she had a concussion when she initially injured her right shoulder back in 2022.  She has an MRI ordered by Dr. Ines. She was limited in work but now she is done for the summer.  She does endorse being off balance but does not use an AD.    Hand dominance: Right  PERTINENT HISTORY:  Rt RCR Dr. Selinda Belvie Gosling  Dec. 19, 2023 Osteoarthritis, right knee, hip  Dr. Ines note: New consult for recurrent headache. She was seen in 11/2022 for tinnitus  and mri brain and mra of the head were unremarkable. Has had esr/crp checked in the past for headaches. She has had headaches since she fell back and hit her head December 19th 2023 she has neck pain, more neck pain, radiates down the neck, shoots up from the back of the head to the eye. Headache starts on the left side(points to the parietal area) and radiates to the left forehead and then back down the neck. A lot of neck tightness. She has a lot of neck muscle tightness and that is contributory, can't describe it just hurts. Radiates down the left arm to right elbow an dthe hand goes to sleep. She was on gabapentin  in the past for her bad legs, she is going to a podiatrist. She states she can't see as clear out of her left eye. She has jaw list. I don;t think she needs steroids as this is chronic ongoing since 2023 and had esr/crp in the past but will check again. In pain 24x7. Did well on gabapentin  but 3x a day is something that is diffult start lyrica . Pain every day, terrible, chronic pain 5-6/10 in the head and in the leg. Ongoing 2 years, been under the care of physicians, failed medical and conservative management.   PAIN:  Are you having pain? Yes: NPRS scale: 1-2/10 Pain location: neck  Pain description: tight  Aggravating factors: using it  Relieving factors: rest, does not take Lyrica  yet.   PRECAUTIONS: None  RED FLAGS: None     WEIGHT BEARING RESTRICTIONS: No  FALLS:  Has patient fallen in last 6 months? NoDoes feel off balance.   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   PATIENT GOALS: Pt would like to be able to feel better.  Get my mind right.   NEXT MD VISIT: As needed   OBJECTIVE:  Note:  Objective measures were completed at Evaluation unless otherwise noted.  DIAGNOSTIC FINDINGS:  MRI injection  MRI 6/176/25 IMPRESSION: This MRI of the cervical spine without contrast shows the following: 1, The spinal cord appears normal. 2.  At C3-C4 there are degenerative changes causing mild spinal stenosis and mild foraminal narrowing but no nerve root compression. 3.  At C4-C5, there are degenerative changes causing moderate spinal stenosis and moderate right greater than left foraminal narrowing.  Degenerative changes encroach upon the nerve roots without causing any definite nerve 4.  At C5-C6, there are degenerative changes causing mild spinal stenosis and moderately severe that could affect the C6 nerve roots. 5.  At C6-C7, there are degenerative changes causing mild spinal stenosis  PATIENT SURVEYS:  NDI:  NECK DISABILITY INDEX  Date: 01/18/24 Score  Pain intensity 2 = The pain is moderate at the moment  2. Personal care (washing, dressing, etc.) 0 = I can look after myself normally without causing extra pain  3. Lifting 3 = Pain prevents me from lifting heavy weights but I can manage light to medium   weights if they are conveniently positioned  4. Reading 2 =  I can read as much as I want with moderate  pain in my neck  5. Headaches 3 = I have moderate headaches, which come frequently  6. Concentration 0 =  I can concentrate fully when I want to with no difficulty  7. Work 4 = I can hardly do any work at all  8. Driving 1 =  I can drive my car as long as I want with slight pain in my neck  9. Sleeping 1 = My sleep is slightly disturbed (less than 1 hr sleepless)  10. Recreation 3 = I am able to engage in a few of my usual recreation activities because of pain in   my neck  Total 20/50   Minimum Detectable Change (90% confidence): 5 points or 10% points  COGNITION: Overall cognitive status: Within functional limits for tasks assessed  SENSATION: L hand numb and tingling    POSTURE: sloped shoulders , forward head   PALPATION: Pain and soreness to palpation to bilateral posterior cervical muscle.  Manual traction improved tingling in left upper extremity   CERVICAL ROM:   Active ROM A/PROM (deg) eval AROM 02/09/24  Flexion 48   Extension 50   Right lateral flexion 40 pain L    Left lateral flexion 35 pain R    Right rotation 55 70  Left rotation 55 75   (Blank rows = not tested)  UPPER EXTREMITY ROM:  Supine PROM all WNL with pain in L UE end range ABD/ER and flexion   Active ROM Right eval Left eval  Shoulder flexion AROM against gravity 120  AROM against gravity 105  Shoulder extension    Shoulder abduction    Shoulder adduction    Shoulder extension    Shoulder internal rotation    Shoulder external rotation    Elbow flexion    Elbow extension    Wrist flexion    Wrist extension    Wrist ulnar deviation    Wrist radial deviation    Wrist pronation    Wrist supination     (Blank rows = not tested)  UPPER EXTREMITY MMT:  MMT Right eval Left eval  Shoulder flexion 4- 3-  Shoulder extension    Shoulder abduction 4 3-  Shoulder adduction    Shoulder extension    Shoulder internal rotation 5 4  Shoulder external rotation 4+ 4  Middle trapezius    Lower trapezius    Elbow flexion    Elbow extension    Wrist flexion    Wrist extension    Wrist ulnar deviation    Wrist radial deviation    Wrist pronation    Wrist supination    Grip strength     (Blank rows = not tested)  CERVICAL SPECIAL TESTS:  Less N/T with traction   FUNCTIONAL TESTS:  NT   TREATMENT DATE:  [facility] Adult PT Treatment:                                                DATE: 02/08/24 Manual Therapy: STM to the upper traps and cervical paraspinals UPAs to C2-C6 Suboccipital release Cervical traction Therapeutic Exercise: Supine chin tuck x10 5 Supine dowel pull over x10 Seated scapular retractions  x10 5 Seated lateral side bending x2 30  each Seated rotation x2 30 each Standing shoulder rows 2x10 GTB Modalities IFC to cervical concurrent with HMP 10 mA  [facility] Adult PT Treatment:  DATE: 02/06/24 Therapeutic Exercise: Supine chin tuck x10 5 Seated scapular retractions  x10 5 Seated lateral side bending x2 30 each Seated rotation x2 30 each Standing shoulder rows 2x10 GTB Supine dowel pull over  Modalities IFC to cervical concurrent with HMP 10.5 mA  Physical Performance Test BERG BALANCE TEST Sitting to Standing: 4.      Stands without using hands and stabilize independently Standing Unsupported: 4.      Stands safely for 2 minutes Sitting Unsupported: 4.     Sits for 2 minutes independently Standing to Sitting: 4.     Sits safely with minimal use of hands Transfers: 4.     Transfers safely with minor use of hands Standing with eyes closed: 4.     Stands safely for 10 seconds  Standing with feet together: 4.     Stands for 1 minute safely Reaching forward with outstretched arm: 4.     Reaches forward 10 inches Retrieving object from the floor: 4.      Able to pick up easily and safely Turning to look behind: 4.     Looks behind from both sides and weight shifts well Turning 360 degrees: 2.     Able to turn slowly, but safely Place alternate foot on stool: 4.     Completes 8 steps in 20 seconds     Standing with one foot in front: 4.     Independent tandem for 30 seconds  Standing on one foot: 4.     Holds >10 seconds  Total Score: 54/56  Berg <36 = High risk for falls (close to 100%)  37-45 = Significant (>80%) fall risk 46-51 = Moderate (>50%) fall risk 52-55 = Lower (>25%) fall risk   Score 26.7-39.6 = patient should use walker full-time Score 44 - 46.5 = patient should use cane indoor Score 47 - 49.6 = patient should use cane outdoor Score 47.9 - 51.1 = no assistive device needed  [facility] Adult PT Treatment:                                                 DATE: 01/31/24 Therapeutic Exercise: Supine chin tuck x10 5 Seated scapular retractions  x10 5 Seated lateral side bending x2 30 each Seated lateral side bending x2 30 each Seated rotation bending x2 30 each Standing chin tucks at wall c scapular retraction x10 3 Standing shoulder rows 2x10 GTB Manual Therapy: STM to the upper traps and cervical paraspinals UPAs to C2-C6 Suboccipital release Cervical traction  01/18/24 Patient was evaluated for cervical pain with radiating symptoms into the left upper extremity Demonstrated home exercise program including chin tuck, scapular retraction, cervical flexion and extension using a towel, upper trap stretches with towel draped over shoulder to anchor shoulder down. Recommended she use moist heat and prescribed medication for pain Discussed trigger point dry needling and how it is now cash based.  She would like to proceed with the MRI first before doing trigger point dry needling.  PATIENT EDUCATION:  Education details: see above  Person educated: Patient Education method: Programmer, multimedia, Demonstration, Verbal cues, and Handouts Education comprehension: verbalized understanding, returned demonstration, and needs further education  HOME EXERCISE PROGRAM: Access Code: [email or web address] URL: [email or web address] Date: 01/24/2024 Prepared by: Dasie Daft  Exercises - Supine Cervical Retraction with Towel  - 1 x daily - 7 x weekly - 2 sets - 10 reps - 5 hold - Standing Cervical Retraction  - 6 x daily - 7 x weekly - 3 sets - 3 reps - 5 hold - Seated Scapular Retraction  - 1 x daily - 7 x weekly - 2 sets - 5 reps - 5 hold - Standing Shoulder Row with Anchored Resistance  - 1 x daily - 7 x weekly - 2 sets - 15 reps - 3 hold - Seated Neck Sidebending Stretch  - 1 x daily - 7 x weekly - 1 sets - 3 reps - 30  hold  ASSESSMENT:  CLINICAL IMPRESSION: Manual therapy was provided as documented above and f/b exercises for cervical ROM and postural/posterior chain strengthening. Assessed cervical rotation AROM and this motion was found markedly improved. ICF was then provided to the neck and upper shoulders in conjunction with moist heat. Pt found the Estim helpful, and expressed interest in obtaining a TENs unit for herself. A handout for how to obtain a TENs unit was provided. Overall, pt has made good progress with neck pain and neck mobility. Pt has 2 PT appts scheduled for next week. If pt continues to keep doing as well as she has been, anticipate DC from PT services next week.  Patient is a 68y.o. female who was seen today for physical therapy evaluation and treatment for cervicalgia and headache.   OBJECTIVE IMPAIRMENTS: decreased balance, decreased mobility, decreased ROM, decreased strength, increased fascial restrictions, increased muscle spasms, impaired flexibility, impaired sensation, postural dysfunction, and pain.   ACTIVITY LIMITATIONS: carrying, lifting, sleeping, reach over head, hygiene/grooming, locomotion level, and caring for others  PARTICIPATION LIMITATIONS: meal prep, cleaning, laundry, community activity, and occupation  PERSONAL FACTORS: Past/current experiences and 3+ comorbidities: History of breast cancer, right-sided shoulder rotator cuff repair: Other orthopedic issues are also affecting patient's functional outcome.   REHAB POTENTIAL: Good  CLINICAL DECISION MAKING: Evolving/moderate complexity  EVALUATION COMPLEXITY: Moderate   GOALS: Goals reviewed with patient? Yes  SHORT TERM GOALS: Target date: 02/15/2024    Patient will be able to show independence for initial HEP to include posture, core and hip strength and stability.  Baseline:  Goal status: MET  2.  Patient will be able to demonstrate proper sitting posture and upper extremity support in order to  reduce pain at rest Baseline:  Goal status: INITIAL  3.  Patient will complete balance assessment and set goal Baseline:  Goal status: MET   LONG TERM GOALS: Target date: 03/14/2024    Pt will be able to show I with final HEP upon discharge for posture, lifting and UE strength     Baseline:  Goal status: INITIAL  2.  Patient will improve NDI score by 8.5 points Baseline: 20/50 Goal status: INITIAL  3.  Patient will be able to resume light home tasks without increased pain Baseline:  Goal status: INITIAL  4.  Patient will be able to care for her small grandkids without increased pain in her shoulder and arm Baseline:  Goal status: INITIAL  5.  Balance goal to be assessed Baseline:  Goal status: MET  PLAN:  PT FREQUENCY: 2x/week  PT DURATION: 8 weeks  PLANNED INTERVENTIONS: [id number]- PT Re-evaluation, 97750- Physical Performance Testing, 97110-Therapeutic exercises, 97530- Therapeutic activity, 97112- Neuromuscular re-education, 97535- Self Care, [id number]- Manual therapy, [phone]- Gait training, Patient/Family education, Balance training, Joint mobilization, Cryotherapy, and Moist heat  PLAN FOR NEXT SESSION: Check home exercise program, manual therapy, symptom management, AROM bilateral shoulders  Cadee Agro MS, PT 02/09/24 1:59 PM

## 2024-02-09 ENCOUNTER — Ambulatory Visit: Attending: Neurology

## 2024-02-09 DIAGNOSIS — M6281 Muscle weakness (generalized): Secondary | ICD-10-CM | POA: Insufficient documentation

## 2024-02-09 DIAGNOSIS — M5412 Radiculopathy, cervical region: Secondary | ICD-10-CM | POA: Diagnosis not present

## 2024-02-09 NOTE — Patient Instructions (Signed)

## 2024-02-13 ENCOUNTER — Ambulatory Visit: Admitting: Physical Therapy

## 2024-02-13 DIAGNOSIS — M6281 Muscle weakness (generalized): Secondary | ICD-10-CM

## 2024-02-13 DIAGNOSIS — M5412 Radiculopathy, cervical region: Secondary | ICD-10-CM | POA: Diagnosis not present

## 2024-02-13 NOTE — Therapy (Signed)
 OUTPATIENT PHYSICAL THERAPY CERVICAL TREATMENT   Patient Name: Dominique Dillon MRN: [id number] DOB:01-20-1956, 68 y.o., female Today's Date: 02/13/2024  END OF SESSION:  PT End of Session - 02/13/24 1102     Visit Number 6    Number of Visits 16    Date for PT Re-Evaluation 03/14/24    Authorization Type [employer] M[employer]    Authorization Time Period 01/18/24-03/03/24    Authorization - Visit Number 6    Authorization - Number of Visits 12    PT Start Time 1100    PT Stop Time 1138    PT Time Calculation (min) 38 min             Past Medical History:  Diagnosis Date   Allergic rhinitis    Arthritis    knees   Breast cancer of upper-outer quadrant of left female breast (HCC) 09/12/2014   DDD (degenerative disc disease), cervical    Depression    GERD (gastroesophageal reflux disease)    History of anemia    still takes iron supplement   Hypertension    under control with med., has been on med. since 1990s   Insulin  dependent diabetes mellitus    Obesity    Personal history of chemotherapy    Personal history of radiation therapy    Recurrent upper respiratory infection (URI)    Seasonal asthma    no current med.   Past Surgical History:  Procedure Laterality Date   ABDOMINAL HYSTERECTOMY  1995   complete   APPENDECTOMY  1980   BREAST BIOPSY Left 10/15/2015   BREAST BIOPSY Left 09/26/2015   BREAST BIOPSY Left 09/09/2014   BREAST LUMPECTOMY Left 10/01/2014   CARPAL TUNNEL RELEASE Right 08/06/2004   CESAREAN SECTION     x 2   CHOLECYSTECTOMY  1980   COLONOSCOPY     exploratory surgery of abdomen  11/1978   IRRIGATION AND DEBRIDEMENT ABSCESS N/A 03/08/2014   Procedure: IRRIGATION AND DEBRIDEMENT ABDOMINAL WALL ABSCESS;  Surgeon: Elspeth KYM Schultze, MD;  Location: [facility];  Service: General;  Laterality: N/A;   KNEE ARTHROSCOPY Bilateral    NM MYOVIEW  LTD  06/2015   LOW RISK. HTN response to exercise. No EKG changes. Small, partially reversible apical defect (probably  Breast Attenuation, but CRO apical Ischemia).  EF 63% with no RWMA   polyp removed from L breast     PORTACATH PLACEMENT Right 10/21/2014   Procedure: INSERTION PORT-A-CATH;  Surgeon: Donnice Bury, MD;  Location: [facility];  Service: General;  Laterality: Right;   RADIOACTIVE SEED GUIDED PARTIAL MASTECTOMY WITH AXILLARY SENTINEL LYMPH NODE BIOPSY Left 10/01/2014   Procedure: RADIOACTIVE SEED GUIDED LEFT BREAST LUMPECTOMY WITH LEFT  AXILLARY SENTINEL LYMPH NODE BIOPSY;  Surgeon: Donnice Bury, MD;  Location: [facility];  Service: General;  Laterality: Left;   RE-EXCISION OF BREAST LUMPECTOMY Left 10/21/2014   Procedure: RE-EXCISION OF BREAST CANCER, POSTERIOR MARGINS;  Surgeon: Donnice Bury, MD;  Location: [facility];  Service: General;  Laterality: Left;   TONSILLECTOMY  1980   TRIGGER FINGER RELEASE Right 08/06/2004   middle finger   UPPER GI ENDOSCOPY     Patient Active Problem List   Diagnosis Date Noted   Racing heart beat 07/01/2023   Acute hearing loss, left 12/07/2022   Left temporal headache 12/07/2022   Ear pain, left 12/07/2022   Dyslipidemia, goal LDL below 100 08/29/2015   Metabolic syndrome 08/29/2015   Precordial pain 05/28/2015   Right  carotid bruit 05/28/2015   Intermittent claudication (HCC) 05/28/2015   Hypoxia 05/20/2015   Hypotension 05/20/2015   Chest pain 05/20/2015   Hypokalemia 05/20/2015   Prolonged QT interval 05/20/2015   Syncope 05/20/2015   Chest pain at rest 05/20/2015   Obesity, Class II, BMI 35-39.9    Medication adverse effect    Thalassemia 03/05/2015   Seasonal allergies 02/08/2015   Cancer associated pain 02/08/2015   Glaucoma 12/31/2014   Breast cancer of upper-outer quadrant of left female breast (HCC) 09/12/2014   Abscess of abdominal wall s/p I&D 7/30- & 03/08/2014 03/08/2014   Cellulitis 03/08/2014   Diabetes mellitus, type II, insulin  dependent (HCC) 03/08/2014   Essential  hypertension, benign 03/08/2014   GERD (gastroesophageal reflux disease) 03/08/2014   Anemia in neoplastic disease 03/08/2014    PCP: Clarice Pouch MD [facility], [place])   REFERRING PROVIDER: Ines Onetha NOVAK, MD  REFERRING DIAG: [phone] (ICD-10-CM) - Chronic neck pain M54.2 (ICD-10-CM) - Cervicalgia  THERAPY DIAG:  Radiculopathy, cervical region  Muscle weakness (generalized)  Rationale for Evaluation and Treatment: Rehabilitation  ONSET DATE: 18 mos   SUBJECTIVE:                                                                                                                                                                                                         SUBJECTIVE STATEMENT: My arm woke me up early morning. The left arm hurts with some movements. Overall it is better.    EVAL: Pt with pain in her neck and left upper extremity.  She reports frequent headaches, difficulty using her arms especially the left.  When she had her right shoulder surgery she had a nerve block and feels like ever since then her body has not been right.  She reports left upper extremity weakness and it feels like it does not belong to her.  She has numbness and tingling in the left upper extremity much of the time.  She reports she had a concussion when she initially injured her right shoulder back in 2022.  She has an MRI ordered by Dr. Ines. She was limited in work but now she is done for the summer.  She does endorse being off balance but does not use an AD.    Hand dominance: Right  PERTINENT HISTORY:  Rt RCR Dr. Selinda Belvie Gosling  Dec. 19, 2023 Osteoarthritis, right knee, hip  Dr. Ines note: New consult for recurrent headache. She was seen in 11/2022 for tinnitus and mri brain and mra of the head were unremarkable. Has had  esr/crp checked in the past for headaches. She has had headaches since she fell back and hit her head December 19th 2023 she has neck pain, more neck pain, radiates  down the neck, shoots up from the back of the head to the eye. Headache starts on the left side(points to the parietal area) and radiates to the left forehead and then back down the neck. A lot of neck tightness. She has a lot of neck muscle tightness and that is contributory, can't describe it just hurts. Radiates down the left arm to right elbow an dthe hand goes to sleep. She was on gabapentin  in the past for her bad legs, she is going to a podiatrist. She states she can't see as clear out of her left eye. She has jaw list. I don;t think she needs steroids as this is chronic ongoing since 2023 and had esr/crp in the past but will check again. In pain 24x7. Did well on gabapentin  but 3x a day is something that is diffult start lyrica . Pain every day, terrible, chronic pain 5-6/10 in the head and in the leg. Ongoing 2 years, been under the care of physicians, failed medical and conservative management.   PAIN:  Are you having pain? Yes: NPRS scale: 1-2/10 Pain location: neck  Pain description: tight  Aggravating factors: using it  Relieving factors: rest, does not take Lyrica  yet.   PRECAUTIONS: None  RED FLAGS: None     WEIGHT BEARING RESTRICTIONS: No  FALLS:  Has patient fallen in last 6 months? NoDoes feel off balance.   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   PATIENT GOALS: Pt would like to be able to feel better.  Get my mind right.   NEXT MD VISIT: As needed   OBJECTIVE:  Note: Objective measures were completed at Evaluation unless otherwise noted.  DIAGNOSTIC FINDINGS:  MRI injection  MRI 6/176/25 IMPRESSION: This MRI of the cervical spine without contrast shows the following: 1, The spinal  cord appears normal. 2.  At C3-C4 there are degenerative changes causing mild spinal stenosis and mild foraminal narrowing but no nerve root compression. 3.  At C4-C5, there are degenerative changes causing moderate spinal stenosis and moderate right greater than left foraminal narrowing.  Degenerative changes encroach upon the nerve roots without causing any definite nerve 4.  At C5-C6, there are degenerative changes causing mild spinal stenosis and moderately severe that could affect the C6 nerve roots. 5.  At C6-C7, there are degenerative changes causing mild spinal stenosis  PATIENT SURVEYS:  NDI:  NECK DISABILITY INDEX  Date: 01/18/24 Score  Pain intensity 2 = The pain is moderate at the moment  2. Personal care (washing, dressing, etc.) 0 = I can look after myself normally without causing extra pain  3. Lifting 3 = Pain prevents me from lifting heavy weights but I can manage light to medium   weights if they are conveniently positioned  4. Reading 2 =  I can read as much as I want with moderate pain in my neck  5. Headaches 3 = I have moderate headaches, which come frequently  6. Concentration 0 =  I can concentrate fully when I want to with no difficulty  7.  Work 4 = I can hardly do any work at all  8. Driving 1 =  I can drive my car as long as I want with slight pain in my neck  9. Sleeping 1 = My sleep is slightly disturbed (less than 1 hr sleepless)  10. Recreation 3 = I am able to engage in a few of my usual recreation activities because of pain in   my neck  Total 20/50   Minimum Detectable Change (90% confidence): 5 points or 10% points 02/13/24: NDI 8/50  COGNITION: Overall cognitive status: Within functional limits for tasks assessed  SENSATION: L hand numb and tingling   POSTURE: sloped shoulders , forward head   PALPATION: Pain and soreness to palpation to bilateral posterior cervical muscle.  Manual traction improved tingling in left upper extremity   CERVICAL ROM:    Active ROM A/PROM (deg) eval AROM 02/09/24  Flexion 48   Extension 50   Right lateral flexion 40 pain L    Left lateral flexion 35 pain R    Right rotation 55 70  Left rotation 55 75   (Blank rows = not tested)  UPPER EXTREMITY ROM:  Supine PROM all WNL with pain in L UE end range ABD/ER and flexion   Active ROM Right eval Left eval Left 02/13/24  Shoulder flexion AROM against gravity 120  AROM against gravity 105 142  Shoulder extension     Shoulder abduction     Shoulder adduction     Shoulder extension     Shoulder internal rotation     Shoulder external rotation     Elbow flexion     Elbow extension     Wrist flexion     Wrist extension     Wrist ulnar deviation     Wrist radial deviation     Wrist pronation     Wrist supination      (Blank rows = not tested)  UPPER EXTREMITY MMT:  MMT Right eval Left eval Left 02/13/24  Shoulder flexion 4- 3- 4-  Shoulder extension     Shoulder abduction 4 3- 4-  Shoulder adduction     Shoulder extension     Shoulder internal rotation 5 4   Shoulder external rotation 4+ 4   Middle trapezius     Lower trapezius     Elbow flexion     Elbow extension     Wrist flexion     Wrist extension     Wrist ulnar deviation     Wrist radial deviation     Wrist pronation     Wrist supination     Grip strength      (Blank rows = not tested)  CERVICAL SPECIAL TESTS:  Less N/T with traction   FUNCTIONAL TESTS:  NT   TREATMENT DATE:  [facility] Adult PT Treatment:                                                DATE: 02/13/24 Therapeutic Exercise: Review of HEP  Therapeutic Activity: MMT AROM Self care:  Pain management strategies including pacing, modifications, postural corrections, HEP, TENS     [facility] Adult PT Treatment:  DATE: 02/08/24 Manual Therapy: STM to the upper traps and cervical paraspinals UPAs to C2-C6 Suboccipital release Cervical traction Therapeutic  Exercise: Supine chin tuck x10 5 Supine dowel pull over x10 Seated scapular retractions  x10 5 Seated lateral side bending x2 30 each Seated rotation x2 30 each Standing shoulder rows 2x10 GTB Modalities IFC to cervical concurrent with HMP 10 mA  [facility] Adult PT Treatment:                                                DATE: 02/06/24 Therapeutic Exercise: Supine chin tuck x10 5 Seated scapular retractions  x10 5 Seated lateral side bending x2 30 each Seated rotation x2 30 each Standing shoulder rows 2x10 GTB Supine dowel pull over  Modalities IFC to cervical concurrent with HMP 10.5 mA  Physical Performance Test BERG BALANCE TEST  Total Score: 54/56                                                                                                                              PATIENT EDUCATION:  Education details: see above  Person educated: Patient Education method: Programmer, multimedia, Demonstration, Verbal cues, and Handouts Education comprehension: verbalized understanding, returned demonstration, and needs further education  HOME EXERCISE PROGRAM: Access Code: [email or web address] URL: [email or web address] Date: 01/24/2024 Prepared by: Dasie Daft  Exercises - Supine Cervical Retraction with Towel  - 1 x daily - 7 x weekly - 2 sets - 10 reps - 5 hold - Standing Cervical Retraction  - 6 x daily - 7 x weekly - 3 sets - 3 reps - 5 hold - Seated Scapular Retraction  - 1 x daily - 7 x weekly - 2 sets - 5 reps - 5 hold - Standing Shoulder Row with Anchored Resistance  - 1 x daily - 7 x weekly - 2 sets - 15 reps - 3 hold - Seated Neck Sidebending Stretch  - 1 x daily - 7 x weekly - 1 sets - 3 reps - 30 hold  ASSESSMENT:  CLINICAL IMPRESSION: Pt reports she has intermittent pain in left arm that woke her from sleep last night. Overall she reports improvement in pain and function. Her NDI improved 11 points, her shoulder strength and AROM has improved, she is able to care  for grand kids and perform light household duties without limitation by pain. Today, she has met all LTGS and is agreeable to DC to HEP. Will follow up with neurosurgeon as needed. At this time has decided to postpone any further interventions for her neck and hopes to manage her pain with HEP.    Patient is a 68y.o. female who was seen today for physical therapy evaluation and treatment for cervicalgia and headache.   OBJECTIVE IMPAIRMENTS: decreased balance, decreased mobility, decreased ROM, decreased strength, increased fascial restrictions, increased muscle spasms,  impaired flexibility, impaired sensation, postural dysfunction, and pain.   ACTIVITY LIMITATIONS: carrying, lifting, sleeping, reach over head, hygiene/grooming, locomotion level, and caring for others  PARTICIPATION LIMITATIONS: meal prep, cleaning, laundry, community activity, and occupation  PERSONAL FACTORS: Past/current experiences and 3+ comorbidities: History of breast cancer, right-sided shoulder rotator cuff repair: Other orthopedic issues are also affecting patient's functional outcome.   REHAB POTENTIAL: Good  CLINICAL DECISION MAKING: Evolving/moderate complexity  EVALUATION COMPLEXITY: Moderate   GOALS: Goals reviewed with patient? Yes  SHORT TERM GOALS: Target date: 02/15/2024    Patient will be able to show independence for initial HEP to include posture, core and hip strength and stability.  Baseline:  Goal status: MET  2.  Patient will be able to demonstrate proper sitting posture and upper extremity support in order to reduce pain at rest Baseline:  Goal status: MET   3.  Patient will complete balance assessment and set goal Baseline:  Goal status: MET   LONG TERM GOALS: Target date: 03/14/2024    Pt will be able to show I with final HEP upon discharge for posture, lifting and UE strength     Baseline:  Goal status: MET  2.  Patient will improve NDI score by 8.5 points Baseline:  20/50 02/13/24: 9/50 Goal status: MET  3.  Patient will be able to resume light home tasks without increased pain Baseline:  02/13/24: better, has resumed.  Goal status: MET  4.  Patient will be able to care for her small grandkids without increased pain in her shoulder and arm Baseline:  02/13/24: not limited  Goal status: MET   5.  Balance goal to be assessed Baseline:  Goal status: MET  PLAN:  PT FREQUENCY: 2x/week  PT DURATION: 8 weeks  PLANNED INTERVENTIONS: 97164- PT Re-evaluation, 97750- Physical Performance Testing, 97110-Therapeutic exercises, 97530- Therapeutic activity, 97112- Neuromuscular re-education, 97535- Self Care, 02859- Manual therapy, [phone]- Gait training, Patient/Family education, Balance training, Joint mobilization, Cryotherapy, and Moist heat  PLAN FOR NEXT SESSION: N/A, DC to HEP  Harlene Persons, PTA 02/13/24 11:39 AM Phone: [phone] Fax: [phone]

## 2024-02-15 ENCOUNTER — Other Ambulatory Visit (HOSPITAL_COMMUNITY): Payer: Self-pay

## 2024-02-16 ENCOUNTER — Other Ambulatory Visit (HOSPITAL_COMMUNITY): Payer: Self-pay

## 2024-02-16 ENCOUNTER — Ambulatory Visit

## 2024-02-16 MED ORDER — PANTOPRAZOLE SODIUM 40 MG PO TBEC
40.0000 mg | DELAYED_RELEASE_TABLET | Freq: Two times a day (BID) | ORAL | 0 refills | Status: AC
  Filled 2024-02-16 – 2024-03-08 (×2): qty 180, 90d supply, fill #0

## 2024-02-16 MED ORDER — PANTOPRAZOLE SODIUM 40 MG PO TBEC
40.0000 mg | DELAYED_RELEASE_TABLET | Freq: Two times a day (BID) | ORAL | 0 refills | Status: AC
  Filled 2024-02-16 – 2024-06-26 (×4): qty 180, 90d supply, fill #0

## 2024-02-17 ENCOUNTER — Other Ambulatory Visit (HOSPITAL_COMMUNITY): Payer: Self-pay

## 2024-02-17 ENCOUNTER — Other Ambulatory Visit: Payer: Self-pay

## 2024-02-22 ENCOUNTER — Other Ambulatory Visit (HOSPITAL_COMMUNITY): Payer: Self-pay

## 2024-02-23 ENCOUNTER — Other Ambulatory Visit (HOSPITAL_COMMUNITY): Payer: Self-pay

## 2024-02-23 DIAGNOSIS — E1169 Type 2 diabetes mellitus with other specified complication: Secondary | ICD-10-CM | POA: Diagnosis not present

## 2024-02-23 MED ORDER — MOUNJARO 10 MG/0.5ML ~~LOC~~ SOAJ
10.0000 mg | SUBCUTANEOUS | 3 refills | Status: DC
  Filled 2024-02-23: qty 2, 28d supply, fill #0
  Filled 2024-03-19: qty 2, 28d supply, fill #1

## 2024-02-24 ENCOUNTER — Other Ambulatory Visit (HOSPITAL_COMMUNITY): Payer: Self-pay

## 2024-03-07 ENCOUNTER — Other Ambulatory Visit (HOSPITAL_COMMUNITY): Payer: Self-pay

## 2024-03-07 ENCOUNTER — Other Ambulatory Visit: Payer: Self-pay | Admitting: Allergy & Immunology

## 2024-03-08 ENCOUNTER — Other Ambulatory Visit (HOSPITAL_COMMUNITY): Payer: Self-pay

## 2024-03-13 DIAGNOSIS — I10 Essential (primary) hypertension: Secondary | ICD-10-CM | POA: Diagnosis not present

## 2024-03-13 DIAGNOSIS — E1165 Type 2 diabetes mellitus with hyperglycemia: Secondary | ICD-10-CM | POA: Diagnosis not present

## 2024-03-13 DIAGNOSIS — R350 Frequency of micturition: Secondary | ICD-10-CM | POA: Diagnosis not present

## 2024-03-13 DIAGNOSIS — M25519 Pain in unspecified shoulder: Secondary | ICD-10-CM | POA: Diagnosis not present

## 2024-03-13 DIAGNOSIS — Z79899 Other long term (current) drug therapy: Secondary | ICD-10-CM | POA: Diagnosis not present

## 2024-03-13 DIAGNOSIS — M542 Cervicalgia: Secondary | ICD-10-CM | POA: Diagnosis not present

## 2024-03-13 DIAGNOSIS — E7800 Pure hypercholesterolemia, unspecified: Secondary | ICD-10-CM | POA: Diagnosis not present

## 2024-03-15 ENCOUNTER — Other Ambulatory Visit (HOSPITAL_COMMUNITY): Payer: Self-pay

## 2024-03-15 ENCOUNTER — Ambulatory Visit
Admission: RE | Admit: 2024-03-15 | Discharge: 2024-03-15 | Disposition: A | Discharge status: Home / Self Care | Source: Ambulatory Visit | Attending: Internal Medicine | Admitting: Internal Medicine

## 2024-03-15 DIAGNOSIS — Z1231 Encounter for screening mammogram for malignant neoplasm of breast: Secondary | ICD-10-CM

## 2024-03-15 MED ORDER — HYDROCHLOROTHIAZIDE 25 MG PO TABS
25.0000 mg | ORAL_TABLET | Freq: Every day | ORAL | 0 refills | Status: DC
  Filled 2024-04-02: qty 90, 90d supply, fill #0

## 2024-03-15 MED ORDER — CETIRIZINE HCL 10 MG PO TABS
10.0000 mg | ORAL_TABLET | Freq: Every day | ORAL | 3 refills | Status: AC
  Filled 2024-04-19: qty 90, 90d supply, fill #0
  Filled 2024-06-26: qty 90, 90d supply, fill #1

## 2024-03-15 MED ORDER — MOUNJARO 5 MG/0.5ML ~~LOC~~ SOAJ
5.0000 mg | SUBCUTANEOUS | 0 refills | Status: DC
  Filled 2024-07-09 – 2024-07-10 (×2): qty 2, 28d supply, fill #0

## 2024-03-15 MED ORDER — LOVASTATIN 40 MG PO TABS: 40.0000 mg | ORAL_TABLET | Freq: Every day | ORAL | 3 refills | Status: AC

## 2024-03-15 MED ORDER — LATANOPROST 0.005 % OP SOLN
1.0000 [drp] | Freq: Every evening | OPHTHALMIC | 3 refills | Status: DC
  Filled 2024-03-23: qty 7.5, 75d supply, fill #0
  Filled 2024-05-27: qty 7.5, 75d supply, fill #1
  Filled 2024-07-19: qty 7.5, 75d supply, fill #2

## 2024-03-15 MED ORDER — DEXCOM G7 SENSOR MISC
1.0000 | 5 refills | Status: AC
  Filled 2024-05-27: qty 3, fill #0
  Filled 2024-06-26: qty 3, 30d supply, fill #0
  Filled 2024-07-30: qty 3, 30d supply, fill #1
  Filled 2024-08-27 (×2): qty 3, 30d supply, fill #2

## 2024-03-15 MED ORDER — MOUNJARO 7.5 MG/0.5ML ~~LOC~~ SOAJ
7.5000 mg | SUBCUTANEOUS | 0 refills | Status: DC
  Filled 2024-03-16 – 2024-04-02 (×4): qty 2, 28d supply, fill #0

## 2024-03-15 MED ORDER — MOUNJARO 7.5 MG/0.5ML ~~LOC~~ SOAJ
7.5000 mg | SUBCUTANEOUS | 3 refills | Status: DC
  Filled 2024-04-19 – 2024-06-26 (×3): qty 2, 28d supply, fill #0

## 2024-03-15 MED ORDER — MOUNJARO 12.5 MG/0.5ML ~~LOC~~ SOAJ: 12.5000 mg | SUBCUTANEOUS | 3 refills | Status: DC

## 2024-03-15 MED ORDER — PANTOPRAZOLE SODIUM 40 MG PO TBEC
40.0000 mg | DELAYED_RELEASE_TABLET | Freq: Every day | ORAL | 3 refills | Status: DC
  Filled 2024-05-27: qty 90, 90d supply, fill #0
  Filled 2024-08-29: qty 90, 90d supply, fill #1

## 2024-03-15 MED ORDER — LOVASTATIN 40 MG PO TABS
40.0000 mg | ORAL_TABLET | Freq: Every day | ORAL | 3 refills | Status: AC
  Filled 2024-03-30: qty 90, 90d supply, fill #0
  Filled 2024-06-26: qty 90, 90d supply, fill #1

## 2024-03-15 MED ORDER — LOSARTAN POTASSIUM 25 MG PO TABS
25.0000 mg | ORAL_TABLET | Freq: Every day | ORAL | 3 refills | Status: AC
  Filled 2024-03-30: qty 90, 90d supply, fill #0
  Filled 2024-06-26: qty 90, 90d supply, fill #1

## 2024-03-15 MED ORDER — MOUNJARO 10 MG/0.5ML ~~LOC~~ SOAJ
10.0000 mg | SUBCUTANEOUS | 0 refills | Status: DC
  Filled 2024-03-16 – 2024-04-02 (×4): qty 2, 28d supply, fill #0

## 2024-03-16 ENCOUNTER — Other Ambulatory Visit (HOSPITAL_COMMUNITY): Payer: Self-pay

## 2024-03-19 ENCOUNTER — Other Ambulatory Visit (HOSPITAL_COMMUNITY): Payer: Self-pay

## 2024-03-20 ENCOUNTER — Other Ambulatory Visit (HOSPITAL_COMMUNITY): Payer: Self-pay

## 2024-03-20 DIAGNOSIS — E1140 Type 2 diabetes mellitus with diabetic neuropathy, unspecified: Secondary | ICD-10-CM | POA: Diagnosis not present

## 2024-03-20 DIAGNOSIS — Z87440 Personal history of urinary (tract) infections: Secondary | ICD-10-CM | POA: Diagnosis not present

## 2024-03-20 DIAGNOSIS — R1030 Lower abdominal pain, unspecified: Secondary | ICD-10-CM | POA: Diagnosis not present

## 2024-03-23 ENCOUNTER — Other Ambulatory Visit (HOSPITAL_COMMUNITY): Payer: Self-pay

## 2024-03-23 MED ORDER — CIPROFLOXACIN HCL 500 MG PO TABS
500.0000 mg | ORAL_TABLET | Freq: Two times a day (BID) | ORAL | 0 refills | Status: AC
  Filled 2024-03-23: qty 14, 7d supply, fill #0

## 2024-03-26 ENCOUNTER — Other Ambulatory Visit (HOSPITAL_COMMUNITY): Payer: Self-pay

## 2024-03-27 DIAGNOSIS — N390 Urinary tract infection, site not specified: Secondary | ICD-10-CM | POA: Diagnosis not present

## 2024-03-27 DIAGNOSIS — A498 Other bacterial infections of unspecified site: Secondary | ICD-10-CM | POA: Diagnosis not present

## 2024-03-27 DIAGNOSIS — I10 Essential (primary) hypertension: Secondary | ICD-10-CM | POA: Diagnosis not present

## 2024-03-30 ENCOUNTER — Telehealth: Payer: Self-pay

## 2024-03-30 ENCOUNTER — Other Ambulatory Visit (HOSPITAL_COMMUNITY): Payer: Self-pay

## 2024-03-30 ENCOUNTER — Other Ambulatory Visit: Payer: Self-pay

## 2024-03-30 NOTE — Telephone Encounter (Signed)
 Pharmacy Quality Measure Review  This patient is appearing on a report for being at risk of failing the adherence measure for cholesterol (statin) medications this calendar year.   Medication: Lovastatin  Last fill date: 12/05/2023 for 90 day supply  Reviewed medication indication, dosing, and goals of therapy.  and Contacted pharmacy to facilitate refills. Patient was unsure if statin therapy was to be stopped with Repatha, discussed.  Powell Gallus, PharmD, MPH PGY1 Community-Based Pharmacy Resident

## 2024-04-02 ENCOUNTER — Other Ambulatory Visit (HOSPITAL_COMMUNITY): Payer: Self-pay

## 2024-04-19 ENCOUNTER — Other Ambulatory Visit (HOSPITAL_COMMUNITY): Payer: Self-pay

## 2024-04-19 MED ORDER — DULOXETINE HCL 30 MG PO CPEP
30.0000 mg | ORAL_CAPSULE | Freq: Every day | ORAL | 1 refills | Status: AC
  Filled 2024-04-19: qty 90, 90d supply, fill #0
  Filled 2024-07-16: qty 90, 90d supply, fill #1

## 2024-04-19 MED ORDER — REPATHA SURECLICK 140 MG/ML ~~LOC~~ SOAJ
140.0000 mg | SUBCUTANEOUS | 3 refills | Status: AC
  Filled 2024-04-19: qty 6, 84d supply, fill #0
  Filled 2024-07-02: qty 6, 84d supply, fill #1

## 2024-05-01 DIAGNOSIS — N3941 Urge incontinence: Secondary | ICD-10-CM | POA: Diagnosis not present

## 2024-05-01 DIAGNOSIS — N952 Postmenopausal atrophic vaginitis: Secondary | ICD-10-CM | POA: Diagnosis not present

## 2024-05-01 DIAGNOSIS — N3020 Other chronic cystitis without hematuria: Secondary | ICD-10-CM | POA: Diagnosis not present

## 2024-05-03 ENCOUNTER — Ambulatory Visit (INDEPENDENT_AMBULATORY_CARE_PROVIDER_SITE_OTHER): Admitting: Podiatry

## 2024-05-03 ENCOUNTER — Encounter: Payer: Self-pay | Admitting: Podiatry

## 2024-05-03 DIAGNOSIS — M79674 Pain in right toe(s): Secondary | ICD-10-CM | POA: Diagnosis not present

## 2024-05-03 DIAGNOSIS — M79675 Pain in left toe(s): Secondary | ICD-10-CM

## 2024-05-03 DIAGNOSIS — E119 Type 2 diabetes mellitus without complications: Secondary | ICD-10-CM

## 2024-05-03 DIAGNOSIS — B351 Tinea unguium: Secondary | ICD-10-CM | POA: Diagnosis not present

## 2024-05-03 DIAGNOSIS — Z794 Long term (current) use of insulin: Secondary | ICD-10-CM

## 2024-05-03 NOTE — Progress Notes (Signed)
 This patient returns to my office for at risk foot care.  This patient requires this care by a professional since this patient will be at risk due to having diabetes with neuropathy. This patient is unable to cut nails herself since the patient cannot reach her nails.These nails are painful walking and wearing shoes.  This patient presents for at risk foot care today.  General Appearance  Alert, conversant and in no acute stress.  Vascular  Dorsalis pedis and posterior tibial  pulses are palpable  bilaterally.  Capillary return is within normal limits  bilaterally. Temperature is within normal limits  bilaterally.  Neurologic  Senn-Weinstein monofilament wire test within normal limits  bilaterally. Muscle power within normal limits bilaterally.  Nails Thick disfigured discolored nails with subungual debris  from hallux to fifth toes bilaterally. No evidence of bacterial infection or drainage bilaterally.  Orthopedic  No limitations of motion  feet .  No crepitus or effusions noted.  No bony pathology or digital deformities noted.  Skin  normotropic skin with no porokeratosis noted bilaterally.  No signs of infections or ulcers noted.     Onychomycosis  Pain in right toes  Pain in left toes  Consent was obtained for treatment procedures.   Mechanical debridement of nails 1-5  bilaterally performed with a nail nipper.  Filed with dremel without incident.    Return office visit     3 months                 Told patient to return for periodic foot care and evaluation due to potential at risk complications.   Helane Gunther DPM

## 2024-05-04 ENCOUNTER — Other Ambulatory Visit (HOSPITAL_COMMUNITY): Payer: Self-pay

## 2024-05-04 MED ORDER — MOUNJARO 7.5 MG/0.5ML ~~LOC~~ SOAJ
7.5000 mg | SUBCUTANEOUS | 3 refills | Status: DC
  Filled 2024-05-04: qty 2, 28d supply, fill #0

## 2024-05-22 ENCOUNTER — Other Ambulatory Visit (HOSPITAL_COMMUNITY): Payer: Self-pay

## 2024-05-24 ENCOUNTER — Other Ambulatory Visit (HOSPITAL_COMMUNITY): Payer: Self-pay

## 2024-05-24 DIAGNOSIS — M4316 Spondylolisthesis, lumbar region: Secondary | ICD-10-CM | POA: Diagnosis not present

## 2024-05-24 DIAGNOSIS — M5441 Lumbago with sciatica, right side: Secondary | ICD-10-CM | POA: Diagnosis not present

## 2024-05-24 MED ORDER — IBUPROFEN 800 MG PO TABS
800.0000 mg | ORAL_TABLET | Freq: Four times a day (QID) | ORAL | 0 refills | Status: DC
  Filled 2024-05-24 (×3): qty 30, 8d supply, fill #0

## 2024-05-24 MED ORDER — METHYLPREDNISOLONE 4 MG PO TABS
4.0000 mg | ORAL_TABLET | Freq: Every day | ORAL | 0 refills | Status: AC
  Filled 2024-05-24: qty 7, 7d supply, fill #0

## 2024-05-28 ENCOUNTER — Other Ambulatory Visit: Payer: Self-pay

## 2024-05-28 ENCOUNTER — Other Ambulatory Visit (HOSPITAL_COMMUNITY): Payer: Self-pay

## 2024-06-12 ENCOUNTER — Other Ambulatory Visit (HOSPITAL_COMMUNITY): Payer: Self-pay

## 2024-06-12 MED ORDER — MOUNJARO 5 MG/0.5ML ~~LOC~~ SOAJ
5.0000 mg | SUBCUTANEOUS | 1 refills | Status: AC
  Filled 2024-06-12: qty 2, 28d supply, fill #0
  Filled 2024-08-08 – 2024-08-28 (×3): qty 2, 28d supply, fill #1

## 2024-06-13 DIAGNOSIS — E7800 Pure hypercholesterolemia, unspecified: Secondary | ICD-10-CM | POA: Diagnosis not present

## 2024-06-13 DIAGNOSIS — D649 Anemia, unspecified: Secondary | ICD-10-CM | POA: Diagnosis not present

## 2024-06-13 DIAGNOSIS — E1165 Type 2 diabetes mellitus with hyperglycemia: Secondary | ICD-10-CM | POA: Diagnosis not present

## 2024-06-13 DIAGNOSIS — E782 Mixed hyperlipidemia: Secondary | ICD-10-CM | POA: Diagnosis not present

## 2024-06-13 DIAGNOSIS — E611 Iron deficiency: Secondary | ICD-10-CM | POA: Diagnosis not present

## 2024-06-13 DIAGNOSIS — R5383 Other fatigue: Secondary | ICD-10-CM | POA: Diagnosis not present

## 2024-06-13 LAB — LAB REPORT - SCANNED
Albumin, Urine POC: 3
Albumin/Creatinine Ratio, Urine, POC: 4
Creatinine, POC: 78.5 mg/dL
EGFR: 79

## 2024-06-20 DIAGNOSIS — E1165 Type 2 diabetes mellitus with hyperglycemia: Secondary | ICD-10-CM | POA: Diagnosis not present

## 2024-06-20 DIAGNOSIS — E7800 Pure hypercholesterolemia, unspecified: Secondary | ICD-10-CM | POA: Diagnosis not present

## 2024-06-20 DIAGNOSIS — Z0000 Encounter for general adult medical examination without abnormal findings: Secondary | ICD-10-CM | POA: Diagnosis not present

## 2024-06-20 DIAGNOSIS — D649 Anemia, unspecified: Secondary | ICD-10-CM | POA: Diagnosis not present

## 2024-06-20 DIAGNOSIS — I10 Essential (primary) hypertension: Secondary | ICD-10-CM | POA: Diagnosis not present

## 2024-06-20 DIAGNOSIS — Z23 Encounter for immunization: Secondary | ICD-10-CM | POA: Diagnosis not present

## 2024-06-26 ENCOUNTER — Other Ambulatory Visit: Payer: Self-pay

## 2024-06-26 ENCOUNTER — Other Ambulatory Visit (HOSPITAL_COMMUNITY): Payer: Self-pay

## 2024-06-30 ENCOUNTER — Ambulatory Visit: Payer: Self-pay | Admitting: Cardiology

## 2024-06-30 NOTE — Progress Notes (Signed)
 Labs from PCP 06/13/2024: A1c 8.0.  TC 107, TG 157, HDL 48, LDL 33.  Outstanding.  Glucose elevated at 159, BUN 16,Cr 0.80.  NA 144, K4.5.  Hgb 10.9, PLT 300.  Looks like Repatha  is working.   Alm Clay, MD

## 2024-07-02 ENCOUNTER — Other Ambulatory Visit (HOSPITAL_COMMUNITY): Payer: Self-pay

## 2024-07-09 ENCOUNTER — Other Ambulatory Visit (HOSPITAL_COMMUNITY): Payer: Self-pay

## 2024-07-10 ENCOUNTER — Other Ambulatory Visit (HOSPITAL_COMMUNITY): Payer: Self-pay

## 2024-07-10 DIAGNOSIS — R748 Abnormal levels of other serum enzymes: Secondary | ICD-10-CM | POA: Diagnosis not present

## 2024-07-10 DIAGNOSIS — R29898 Other symptoms and signs involving the musculoskeletal system: Secondary | ICD-10-CM | POA: Diagnosis not present

## 2024-07-18 ENCOUNTER — Other Ambulatory Visit (HOSPITAL_COMMUNITY): Payer: Self-pay

## 2024-07-30 ENCOUNTER — Other Ambulatory Visit (HOSPITAL_COMMUNITY): Payer: Self-pay

## 2024-07-30 MED ORDER — HYDROCHLOROTHIAZIDE 25 MG PO TABS
25.0000 mg | ORAL_TABLET | Freq: Every day | ORAL | 0 refills | Status: AC
  Filled 2024-07-30: qty 90, 90d supply, fill #0

## 2024-07-30 MED ORDER — IBUPROFEN 800 MG PO TABS
800.0000 mg | ORAL_TABLET | Freq: Four times a day (QID) | ORAL | 0 refills | Status: AC
  Filled 2024-07-30: qty 30, 8d supply, fill #0

## 2024-07-31 ENCOUNTER — Other Ambulatory Visit (HOSPITAL_COMMUNITY): Payer: Self-pay

## 2024-08-06 ENCOUNTER — Ambulatory Visit: Admitting: Podiatry

## 2024-08-06 ENCOUNTER — Other Ambulatory Visit

## 2024-08-06 ENCOUNTER — Encounter: Payer: Self-pay | Admitting: Podiatry

## 2024-08-06 VITALS — Ht 63.0 in | Wt 180.0 lb

## 2024-08-06 DIAGNOSIS — M79674 Pain in right toe(s): Secondary | ICD-10-CM | POA: Diagnosis not present

## 2024-08-06 DIAGNOSIS — Z794 Long term (current) use of insulin: Secondary | ICD-10-CM | POA: Diagnosis not present

## 2024-08-06 DIAGNOSIS — M79675 Pain in left toe(s): Secondary | ICD-10-CM | POA: Diagnosis not present

## 2024-08-06 DIAGNOSIS — B351 Tinea unguium: Secondary | ICD-10-CM | POA: Diagnosis not present

## 2024-08-06 DIAGNOSIS — E119 Type 2 diabetes mellitus without complications: Secondary | ICD-10-CM

## 2024-08-06 NOTE — Progress Notes (Signed)
 This patient returns to my office for at risk foot care.  This patient requires this care by a professional since this patient will be at risk due to having diabetes with neuropathy. This patient is unable to cut nails herself since the patient cannot reach her nails.These nails are painful walking and wearing shoes.  This patient presents for at risk foot care today.  General Appearance  Alert, conversant and in no acute stress.  Vascular  Dorsalis pedis and posterior tibial  pulses are palpable  bilaterally.  Capillary return is within normal limits  bilaterally. Temperature is within normal limits  bilaterally.  Neurologic  Senn-Weinstein monofilament wire test within normal limits  bilaterally. Muscle power within normal limits bilaterally.  Nails Thick disfigured discolored nails with subungual debris  from hallux to fifth toes bilaterally. No evidence of bacterial infection or drainage bilaterally.  Orthopedic  No limitations of motion  feet .  No crepitus or effusions noted.  No bony pathology or digital deformities noted.  Skin  normotropic skin with no porokeratosis noted bilaterally.  No signs of infections or ulcers noted.     Onychomycosis  Pain in right toes  Pain in left toes  Consent was obtained for treatment procedures.   Mechanical debridement of nails 1-5  bilaterally performed with a nail nipper.  Filed with dremel without incident.    Return office visit     3 months                 Told patient to return for periodic foot care and evaluation due to potential at risk complications.   Helane Gunther DPM

## 2024-08-14 ENCOUNTER — Telehealth: Payer: Self-pay | Admitting: Neurology

## 2024-08-14 NOTE — Telephone Encounter (Signed)
 Pt asked to cx, she feels better appointment not needed

## 2024-08-16 ENCOUNTER — Ambulatory Visit

## 2024-08-16 DIAGNOSIS — M2042 Other hammer toe(s) (acquired), left foot: Secondary | ICD-10-CM

## 2024-08-16 DIAGNOSIS — M2041 Other hammer toe(s) (acquired), right foot: Secondary | ICD-10-CM

## 2024-08-16 DIAGNOSIS — E0843 Diabetes mellitus due to underlying condition with diabetic autonomic (poly)neuropathy: Secondary | ICD-10-CM

## 2024-08-16 DIAGNOSIS — M2011 Hallux valgus (acquired), right foot: Secondary | ICD-10-CM

## 2024-08-16 NOTE — Progress Notes (Unsigned)
" ° °  DIABETIC SHOES CASTING:  Patient presented for foam casting for 3 pr custom diabetic shoe inserts-  Patient is measured with the [employer]ne Brannok device to be a size 8.5  Diabetic shoes are chosen from the [employer] catalog.  The shoes chosen are No 59 Black-  slip resistant   Patient qualifies for diabetic shoes as she is diabetic with neuropathy and has a bunion right and hammertoes that put her at increased risk for ulceration.  I will fax the paperwork for diabetic shoes to Dr. Clarice.  Once signed and returned,  I will submit the order for the diabetic shoes and 3 pr accommodative inserts.    The patient will be contacted when the shoes and insert are ready to be picked up.   "

## 2024-08-20 ENCOUNTER — Ambulatory Visit: Admitting: Family Medicine

## 2024-08-20 ENCOUNTER — Other Ambulatory Visit (HOSPITAL_COMMUNITY): Payer: Self-pay

## 2024-08-27 ENCOUNTER — Other Ambulatory Visit (HOSPITAL_COMMUNITY): Payer: Self-pay

## 2024-08-27 ENCOUNTER — Other Ambulatory Visit (HOSPITAL_BASED_OUTPATIENT_CLINIC_OR_DEPARTMENT_OTHER): Payer: Self-pay

## 2024-08-30 ENCOUNTER — Other Ambulatory Visit (HOSPITAL_BASED_OUTPATIENT_CLINIC_OR_DEPARTMENT_OTHER): Payer: Self-pay

## 2024-09-04 ENCOUNTER — Other Ambulatory Visit (HOSPITAL_COMMUNITY): Payer: Self-pay

## 2024-09-05 ENCOUNTER — Other Ambulatory Visit (HOSPITAL_COMMUNITY): Payer: Self-pay

## 2024-09-10 ENCOUNTER — Other Ambulatory Visit (HOSPITAL_COMMUNITY): Payer: Self-pay

## 2024-09-13 ENCOUNTER — Other Ambulatory Visit (HOSPITAL_COMMUNITY): Payer: Self-pay

## 2024-09-13 MED ORDER — LATANOPROST 0.005 % OP SOLN
1.0000 [drp] | Freq: Every evening | OPHTHALMIC | 2 refills | Status: AC
  Filled 2024-09-13: qty 7.5, 75d supply, fill #0

## 2024-11-05 ENCOUNTER — Ambulatory Visit: Admitting: Podiatry
